# Patient Record
Sex: Male | Born: 1940 | Race: White | Hispanic: No | Marital: Married | Smoking: Never smoker
Health system: Southern US, Community
[De-identification: ages and names within clinical notes are randomized; demographics above are authoritative.]

## PROBLEM LIST (undated history)

## (undated) DIAGNOSIS — I252 Old myocardial infarction: Secondary | ICD-10-CM

## (undated) DIAGNOSIS — I10 Essential (primary) hypertension: Secondary | ICD-10-CM

## (undated) DIAGNOSIS — I4589 Other specified conduction disorders: Secondary | ICD-10-CM

## (undated) DIAGNOSIS — I2510 Atherosclerotic heart disease of native coronary artery without angina pectoris: Secondary | ICD-10-CM

## (undated) DIAGNOSIS — Z950 Presence of cardiac pacemaker: Secondary | ICD-10-CM

## (undated) DIAGNOSIS — K469 Unspecified abdominal hernia without obstruction or gangrene: Secondary | ICD-10-CM

## (undated) DIAGNOSIS — N200 Calculus of kidney: Secondary | ICD-10-CM

## (undated) DIAGNOSIS — C61 Malignant neoplasm of prostate: Secondary | ICD-10-CM

## (undated) DIAGNOSIS — E785 Hyperlipidemia, unspecified: Secondary | ICD-10-CM

## (undated) DIAGNOSIS — M1990 Unspecified osteoarthritis, unspecified site: Secondary | ICD-10-CM

## (undated) DIAGNOSIS — G43909 Migraine, unspecified, not intractable, without status migrainosus: Secondary | ICD-10-CM

## (undated) DIAGNOSIS — Z8673 Personal history of transient ischemic attack (TIA), and cerebral infarction without residual deficits: Secondary | ICD-10-CM

## (undated) DIAGNOSIS — B019 Varicella without complication: Secondary | ICD-10-CM

## (undated) DIAGNOSIS — I5189 Other ill-defined heart diseases: Secondary | ICD-10-CM

## (undated) HISTORY — DX: Presence of cardiac pacemaker: Z95.0

## (undated) HISTORY — DX: Personal history of transient ischemic attack (TIA), and cerebral infarction without residual deficits: Z86.73

## (undated) HISTORY — DX: Atherosclerotic heart disease of native coronary artery without angina pectoris: I25.10

## (undated) HISTORY — DX: Other ill-defined heart diseases: I51.89

## (undated) HISTORY — DX: Unspecified abdominal hernia without obstruction or gangrene: K46.9

## (undated) HISTORY — DX: Hyperlipidemia, unspecified: E78.5

## (undated) HISTORY — DX: Calculus of kidney: N20.0

## (undated) HISTORY — DX: Malignant neoplasm of prostate: C61

## (undated) HISTORY — DX: Other specified conduction disorders: I45.89

## (undated) HISTORY — DX: Varicella without complication: B01.9

## (undated) HISTORY — DX: Essential (primary) hypertension: I10

## (undated) HISTORY — DX: Migraine, unspecified, not intractable, without status migrainosus: G43.909

## (undated) HISTORY — PX: OTHER SURGICAL HISTORY: SHX169

## (undated) HISTORY — DX: Unspecified osteoarthritis, unspecified site: M19.90

## (undated) SURGERY — LEFT HEART CATH
Anesthesia: Moderate Sedation | Laterality: Right

---

## 2004-09-30 ENCOUNTER — Inpatient Hospital Stay: Payer: Self-pay | Admitting: Internal Medicine

## 2004-09-30 ENCOUNTER — Other Ambulatory Visit: Payer: Self-pay

## 2004-10-11 ENCOUNTER — Inpatient Hospital Stay: Payer: Self-pay | Admitting: Internal Medicine

## 2004-12-21 ENCOUNTER — Ambulatory Visit: Payer: Self-pay | Admitting: Specialist

## 2005-01-01 ENCOUNTER — Emergency Department: Payer: Self-pay | Admitting: Emergency Medicine

## 2005-02-08 ENCOUNTER — Encounter: Payer: Self-pay | Admitting: Internal Medicine

## 2005-03-01 ENCOUNTER — Encounter: Payer: Self-pay | Admitting: Internal Medicine

## 2005-04-01 ENCOUNTER — Encounter: Payer: Self-pay | Admitting: Internal Medicine

## 2005-04-07 ENCOUNTER — Ambulatory Visit: Payer: Self-pay | Admitting: Internal Medicine

## 2005-05-02 ENCOUNTER — Encounter: Payer: Self-pay | Admitting: Internal Medicine

## 2005-05-28 ENCOUNTER — Other Ambulatory Visit: Payer: Self-pay

## 2005-05-28 ENCOUNTER — Inpatient Hospital Stay: Payer: Self-pay | Admitting: Infectious Diseases

## 2005-05-29 ENCOUNTER — Other Ambulatory Visit: Payer: Self-pay

## 2005-06-01 ENCOUNTER — Encounter: Payer: Self-pay | Admitting: Internal Medicine

## 2005-07-13 ENCOUNTER — Other Ambulatory Visit: Payer: Self-pay

## 2005-07-14 ENCOUNTER — Inpatient Hospital Stay: Payer: Self-pay | Admitting: Infectious Diseases

## 2006-04-17 ENCOUNTER — Other Ambulatory Visit: Payer: Self-pay

## 2006-04-27 ENCOUNTER — Inpatient Hospital Stay: Payer: Self-pay | Admitting: Specialist

## 2006-09-12 ENCOUNTER — Ambulatory Visit: Payer: Self-pay | Admitting: Urology

## 2006-11-24 ENCOUNTER — Inpatient Hospital Stay: Payer: Self-pay | Admitting: Cardiology

## 2006-11-24 ENCOUNTER — Other Ambulatory Visit: Payer: Self-pay

## 2007-06-19 ENCOUNTER — Emergency Department: Payer: Self-pay | Admitting: Emergency Medicine

## 2007-06-19 ENCOUNTER — Other Ambulatory Visit: Payer: Self-pay

## 2007-09-26 ENCOUNTER — Inpatient Hospital Stay: Payer: Self-pay | Admitting: Internal Medicine

## 2007-09-26 ENCOUNTER — Other Ambulatory Visit: Payer: Self-pay

## 2007-10-28 ENCOUNTER — Emergency Department: Payer: Self-pay | Admitting: Emergency Medicine

## 2008-02-20 ENCOUNTER — Other Ambulatory Visit: Payer: Self-pay

## 2008-02-20 ENCOUNTER — Inpatient Hospital Stay: Payer: Self-pay | Admitting: *Deleted

## 2008-02-29 ENCOUNTER — Other Ambulatory Visit: Payer: Self-pay

## 2008-02-29 ENCOUNTER — Ambulatory Visit: Payer: Self-pay | Admitting: Urology

## 2008-02-29 ENCOUNTER — Inpatient Hospital Stay: Payer: Self-pay | Admitting: Internal Medicine

## 2008-03-05 ENCOUNTER — Inpatient Hospital Stay: Payer: Self-pay | Admitting: Internal Medicine

## 2008-03-05 ENCOUNTER — Other Ambulatory Visit: Payer: Self-pay

## 2008-03-13 ENCOUNTER — Ambulatory Visit: Payer: Self-pay | Admitting: Urology

## 2008-03-20 ENCOUNTER — Ambulatory Visit: Payer: Self-pay | Admitting: Urology

## 2008-04-21 ENCOUNTER — Ambulatory Visit: Payer: Self-pay | Admitting: Urology

## 2008-10-20 ENCOUNTER — Ambulatory Visit: Payer: Self-pay | Admitting: Urology

## 2009-03-13 ENCOUNTER — Inpatient Hospital Stay: Payer: Self-pay | Admitting: Student

## 2009-05-07 ENCOUNTER — Encounter: Payer: Self-pay | Admitting: Cardiology

## 2009-06-01 ENCOUNTER — Encounter: Payer: Self-pay | Admitting: Cardiology

## 2009-06-27 ENCOUNTER — Emergency Department: Payer: Self-pay | Admitting: Emergency Medicine

## 2009-07-01 ENCOUNTER — Encounter: Payer: Self-pay | Admitting: Cardiology

## 2009-07-15 ENCOUNTER — Emergency Department: Payer: Self-pay | Admitting: Internal Medicine

## 2009-08-01 ENCOUNTER — Encounter: Payer: Self-pay | Admitting: Cardiology

## 2009-08-08 ENCOUNTER — Inpatient Hospital Stay: Payer: Self-pay | Admitting: Internal Medicine

## 2009-08-18 ENCOUNTER — Ambulatory Visit: Payer: Self-pay | Admitting: Surgery

## 2009-09-01 ENCOUNTER — Encounter: Payer: Self-pay | Admitting: Cardiology

## 2009-10-26 ENCOUNTER — Ambulatory Visit: Payer: Self-pay | Admitting: Urology

## 2010-03-01 ENCOUNTER — Ambulatory Visit: Payer: Self-pay | Admitting: Internal Medicine

## 2010-03-03 ENCOUNTER — Ambulatory Visit: Payer: Self-pay | Admitting: Urology

## 2010-03-17 ENCOUNTER — Ambulatory Visit: Payer: Self-pay | Admitting: Urology

## 2010-03-18 LAB — PATHOLOGY REPORT

## 2010-04-01 ENCOUNTER — Ambulatory Visit: Payer: Self-pay | Admitting: Internal Medicine

## 2010-04-29 ENCOUNTER — Ambulatory Visit: Payer: Self-pay | Admitting: Radiation Oncology

## 2010-05-01 ENCOUNTER — Ambulatory Visit: Payer: Self-pay | Admitting: Radiation Oncology

## 2010-05-01 ENCOUNTER — Ambulatory Visit: Payer: Self-pay | Admitting: Internal Medicine

## 2010-05-03 ENCOUNTER — Ambulatory Visit: Payer: Self-pay | Admitting: Radiation Oncology

## 2010-05-06 ENCOUNTER — Ambulatory Visit: Payer: Self-pay | Admitting: Radiation Oncology

## 2010-05-26 ENCOUNTER — Ambulatory Visit: Payer: Self-pay | Admitting: Radiation Oncology

## 2010-06-01 ENCOUNTER — Ambulatory Visit: Payer: Self-pay | Admitting: Radiation Oncology

## 2010-06-01 ENCOUNTER — Ambulatory Visit: Payer: Self-pay | Admitting: Internal Medicine

## 2010-08-06 ENCOUNTER — Ambulatory Visit: Payer: Self-pay | Admitting: Radiation Oncology

## 2010-09-01 ENCOUNTER — Ambulatory Visit: Payer: Self-pay | Admitting: Radiation Oncology

## 2010-09-17 ENCOUNTER — Other Ambulatory Visit: Payer: Self-pay

## 2010-11-22 ENCOUNTER — Encounter: Payer: Self-pay | Admitting: Cardiology

## 2010-11-30 ENCOUNTER — Encounter: Payer: Self-pay | Admitting: Cardiology

## 2010-12-09 ENCOUNTER — Ambulatory Visit: Payer: Self-pay | Admitting: Radiation Oncology

## 2010-12-10 LAB — PSA

## 2010-12-31 ENCOUNTER — Ambulatory Visit: Payer: Self-pay | Admitting: Radiation Oncology

## 2010-12-31 ENCOUNTER — Encounter: Payer: Self-pay | Admitting: Cardiology

## 2011-05-25 ENCOUNTER — Ambulatory Visit: Payer: Self-pay | Admitting: Urology

## 2011-06-13 ENCOUNTER — Ambulatory Visit: Payer: Self-pay | Admitting: Radiation Oncology

## 2011-06-14 LAB — PSA: PSA: 2.4 ng/mL (ref 0.0–4.0)

## 2011-06-30 ENCOUNTER — Ambulatory Visit: Payer: Self-pay | Admitting: Ophthalmology

## 2011-06-30 DIAGNOSIS — I119 Hypertensive heart disease without heart failure: Secondary | ICD-10-CM

## 2011-07-02 ENCOUNTER — Ambulatory Visit: Payer: Self-pay | Admitting: Radiation Oncology

## 2011-07-04 ENCOUNTER — Ambulatory Visit: Payer: Self-pay | Admitting: Ophthalmology

## 2011-07-21 DIAGNOSIS — Z950 Presence of cardiac pacemaker: Secondary | ICD-10-CM

## 2011-07-21 HISTORY — DX: Presence of cardiac pacemaker: Z95.0

## 2011-07-22 DIAGNOSIS — I4589 Other specified conduction disorders: Secondary | ICD-10-CM

## 2011-07-22 DIAGNOSIS — I5189 Other ill-defined heart diseases: Secondary | ICD-10-CM | POA: Insufficient documentation

## 2011-07-22 DIAGNOSIS — I10 Essential (primary) hypertension: Secondary | ICD-10-CM

## 2011-07-22 DIAGNOSIS — E785 Hyperlipidemia, unspecified: Secondary | ICD-10-CM

## 2011-07-22 DIAGNOSIS — I2510 Atherosclerotic heart disease of native coronary artery without angina pectoris: Secondary | ICD-10-CM

## 2011-07-22 HISTORY — DX: Essential (primary) hypertension: I10

## 2011-07-22 HISTORY — DX: Other specified conduction disorders: I45.89

## 2011-07-22 HISTORY — DX: Hyperlipidemia, unspecified: E78.5

## 2011-07-22 HISTORY — DX: Other ill-defined heart diseases: I51.89

## 2011-07-22 HISTORY — DX: Atherosclerotic heart disease of native coronary artery without angina pectoris: I25.10

## 2011-09-26 DIAGNOSIS — Z8673 Personal history of transient ischemic attack (TIA), and cerebral infarction without residual deficits: Secondary | ICD-10-CM

## 2011-09-26 DIAGNOSIS — M1990 Unspecified osteoarthritis, unspecified site: Secondary | ICD-10-CM

## 2011-09-26 DIAGNOSIS — J45909 Unspecified asthma, uncomplicated: Secondary | ICD-10-CM | POA: Insufficient documentation

## 2011-09-26 DIAGNOSIS — R002 Palpitations: Secondary | ICD-10-CM | POA: Insufficient documentation

## 2011-09-26 DIAGNOSIS — N200 Calculus of kidney: Secondary | ICD-10-CM

## 2011-09-26 HISTORY — DX: Calculus of kidney: N20.0

## 2011-09-26 HISTORY — DX: Personal history of transient ischemic attack (TIA), and cerebral infarction without residual deficits: Z86.73

## 2011-09-26 HISTORY — DX: Unspecified osteoarthritis, unspecified site: M19.90

## 2011-12-30 ENCOUNTER — Encounter: Payer: Self-pay | Admitting: Cardiology

## 2011-12-31 ENCOUNTER — Encounter: Payer: Self-pay | Admitting: Cardiology

## 2012-01-30 ENCOUNTER — Encounter: Payer: Self-pay | Admitting: Cardiology

## 2012-02-21 ENCOUNTER — Ambulatory Visit: Payer: Self-pay | Admitting: Ophthalmology

## 2012-03-01 ENCOUNTER — Encounter: Payer: Self-pay | Admitting: Cardiology

## 2012-06-25 ENCOUNTER — Ambulatory Visit: Payer: Self-pay | Admitting: Radiation Oncology

## 2012-06-26 LAB — PSA: PSA: 1.4 ng/mL (ref 0.0–4.0)

## 2012-07-01 ENCOUNTER — Ambulatory Visit: Payer: Self-pay | Admitting: Radiation Oncology

## 2012-09-21 LAB — BASIC METABOLIC PANEL
Anion Gap: 7 (ref 7–16)
Calcium, Total: 8.4 mg/dL — ABNORMAL LOW (ref 8.5–10.1)
Chloride: 109 mmol/L — ABNORMAL HIGH (ref 98–107)
EGFR (African American): >60
Glucose: 119 mg/dL — ABNORMAL HIGH (ref 65–99)
Osmolality: 287 (ref 275–301)
Sodium: 143 mmol/L (ref 136–145)

## 2012-09-21 LAB — CBC
HGB: 15.5 g/dL (ref 13.0–18.0)
MCH: 30.7 pg (ref 26.0–34.0)
MCV: 91 fL (ref 80–100)
RBC: 5.05 10*6/uL (ref 4.40–5.90)

## 2012-09-21 LAB — TROPONIN I
Troponin-I: <0.02 ng/mL
Troponin-I: <0.02 ng/mL

## 2012-09-21 LAB — CK TOTAL AND CKMB (NOT AT ARMC): CK-MB: 1.5 ng/mL (ref 0.5–3.6)

## 2012-09-22 ENCOUNTER — Observation Stay: Payer: Self-pay | Admitting: Internal Medicine

## 2012-09-22 LAB — HEMOGLOBIN A1C: Hemoglobin A1C: 5.9 % (ref 4.2–6.3)

## 2012-09-22 LAB — LIPID PANEL
Triglycerides: 187 mg/dL (ref 0–200)
VLDL Cholesterol, Calc: 37 mg/dL (ref 5–40)

## 2012-10-15 ENCOUNTER — Ambulatory Visit: Payer: Self-pay | Admitting: Family Medicine

## 2012-10-19 ENCOUNTER — Ambulatory Visit: Payer: Self-pay | Admitting: Family Medicine

## 2013-03-04 DIAGNOSIS — C61 Malignant neoplasm of prostate: Secondary | ICD-10-CM

## 2013-03-04 HISTORY — DX: Malignant neoplasm of prostate: C61

## 2013-04-18 ENCOUNTER — Emergency Department: Payer: Self-pay | Admitting: Emergency Medicine

## 2013-04-18 LAB — URINALYSIS, COMPLETE
Bilirubin,UR: NEGATIVE
Glucose,UR: NEGATIVE mg/dL (ref 0–75)
Nitrite: NEGATIVE
Ph: 5 (ref 4.5–8.0)
Protein: 30
RBC,UR: 6 /HPF (ref 0–5)
Specific Gravity: 1.025 (ref 1.003–1.030)
Squamous Epithelial: NONE SEEN
WBC UR: <1 /HPF (ref 0–5)

## 2013-04-18 LAB — COMPREHENSIVE METABOLIC PANEL
Bilirubin,Total: 1 mg/dL (ref 0.2–1.0)
Calcium, Total: 8.7 mg/dL (ref 8.5–10.1)
Chloride: 108 mmol/L — ABNORMAL HIGH (ref 98–107)
Co2: 24 mmol/L (ref 21–32)
Creatinine: 1.15 mg/dL (ref 0.60–1.30)
EGFR (African American): >60
EGFR (Non-African Amer.): >60
Glucose: 120 mg/dL — ABNORMAL HIGH (ref 65–99)
Osmolality: 275 (ref 275–301)
SGPT (ALT): 29 U/L (ref 12–78)
Total Protein: 6.8 g/dL (ref 6.4–8.2)

## 2013-04-18 LAB — CBC
MCH: 30.8 pg (ref 26.0–34.0)
Platelet: 182 10*3/uL (ref 150–440)
RDW: 14.2 % (ref 11.5–14.5)
WBC: 14.6 10*3/uL — ABNORMAL HIGH (ref 3.8–10.6)

## 2013-04-19 LAB — TROPONIN I: Troponin-I: <0.02 ng/mL

## 2013-04-19 LAB — CK TOTAL AND CKMB (NOT AT ARMC): CK, Total: 54 U/L (ref 35–232)

## 2013-05-23 ENCOUNTER — Ambulatory Visit: Payer: Self-pay | Admitting: Gastroenterology

## 2013-05-24 ENCOUNTER — Inpatient Hospital Stay: Payer: Self-pay | Admitting: Specialist

## 2013-05-24 LAB — BASIC METABOLIC PANEL
BUN: 12 mg/dL (ref 7–18)
Calcium, Total: 8.7 mg/dL (ref 8.5–10.1)
Chloride: 107 mmol/L (ref 98–107)
Co2: 23 mmol/L (ref 21–32)
Creatinine: 1.13 mg/dL (ref 0.60–1.30)
EGFR (African American): >60
EGFR (Non-African Amer.): >60
Glucose: 120 mg/dL — ABNORMAL HIGH (ref 65–99)
Osmolality: 275 (ref 275–301)
Potassium: 4.2 mmol/L (ref 3.5–5.1)
Sodium: 137 mmol/L (ref 136–145)

## 2013-05-24 LAB — PATHOLOGY REPORT

## 2013-05-24 LAB — CK TOTAL AND CKMB (NOT AT ARMC)
CK, Total: 148 U/L (ref 35–232)
CK, Total: 159 U/L (ref 35–232)
CK-MB: 3.8 ng/mL — ABNORMAL HIGH (ref 0.5–3.6)

## 2013-05-24 LAB — CBC
HGB: 15.3 g/dL (ref 13.0–18.0)
MCH: 31.6 pg (ref 26.0–34.0)
MCHC: 35.1 g/dL (ref 32.0–36.0)
MCV: 90 fL (ref 80–100)
Platelet: 202 10*3/uL (ref 150–440)
RBC: 4.85 10*6/uL (ref 4.40–5.90)
RDW: 14.2 % (ref 11.5–14.5)

## 2013-05-24 LAB — PROTIME-INR
INR: 1
Prothrombin Time: 13.2 secs (ref 11.5–14.7)

## 2013-05-24 LAB — TROPONIN I
Troponin-I: 0.24 ng/mL — ABNORMAL HIGH
Troponin-I: 0.22 ng/mL — ABNORMAL HIGH

## 2013-05-24 LAB — HEMOGLOBIN: HGB: 14.5 g/dL (ref 13.0–18.0)

## 2013-05-25 LAB — BASIC METABOLIC PANEL
Anion Gap: 6 — ABNORMAL LOW (ref 7–16)
BUN: 7 mg/dL (ref 7–18)
Calcium, Total: 8.4 mg/dL — ABNORMAL LOW (ref 8.5–10.1)
Co2: 25 mmol/L (ref 21–32)
Creatinine: 0.94 mg/dL (ref 0.60–1.30)
EGFR (African American): >60
EGFR (Non-African Amer.): >60
Glucose: 115 mg/dL — ABNORMAL HIGH (ref 65–99)
Osmolality: 275 (ref 275–301)
Sodium: 138 mmol/L (ref 136–145)

## 2013-05-25 LAB — CBC WITH DIFFERENTIAL/PLATELET
Basophil %: 0.5 %
Eosinophil #: 0.2 10*3/uL (ref 0.0–0.7)
HCT: 42 % (ref 40.0–52.0)
HGB: 14.4 g/dL (ref 13.0–18.0)
Lymphocyte #: 1.7 10*3/uL (ref 1.0–3.6)
Lymphocyte %: 18.3 %
MCH: 31.1 pg (ref 26.0–34.0)
MCHC: 34.3 g/dL (ref 32.0–36.0)
MCV: 91 fL (ref 80–100)
Monocyte #: 0.9 x10 3/mm (ref 0.2–1.0)
Monocyte %: 9.2 %
Neutrophil #: 6.5 10*3/uL (ref 1.4–6.5)
Neutrophil %: 69.4 %
RBC: 4.64 10*6/uL (ref 4.40–5.90)
WBC: 9.4 10*3/uL (ref 3.8–10.6)

## 2013-05-25 LAB — LIPID PANEL
Cholesterol: 172 mg/dL (ref 0–200)
HDL Cholesterol: 23 mg/dL — ABNORMAL LOW (ref 40–60)
Ldl Cholesterol, Calc: 110 mg/dL — ABNORMAL HIGH (ref 0–100)
Triglycerides: 197 mg/dL (ref 0–200)
VLDL Cholesterol, Calc: 39 mg/dL (ref 5–40)

## 2013-05-25 LAB — CK: CK, Total: 153 U/L (ref 35–232)

## 2013-06-07 ENCOUNTER — Encounter: Payer: Self-pay | Admitting: Cardiovascular Disease

## 2013-06-21 ENCOUNTER — Ambulatory Visit: Payer: Self-pay | Admitting: Radiation Oncology

## 2013-06-24 ENCOUNTER — Other Ambulatory Visit: Payer: Self-pay | Admitting: Gastroenterology

## 2013-06-25 LAB — WBCS, STOOL

## 2013-07-01 ENCOUNTER — Encounter: Payer: Self-pay | Admitting: Cardiovascular Disease

## 2013-07-01 ENCOUNTER — Ambulatory Visit: Payer: Self-pay | Admitting: Radiation Oncology

## 2013-07-11 IMAGING — CT CT ABDOMEN W/ CM
1 of 2 series · 15 of 32 positions shown, 19 images · non-contrast
Comparison: none

REASON FOR EXAM: abd pain  US showed gallstones
COMMENTS:

[Series 2: abd 3mm w 3.0 i40f 3 · axial · 0.97mm/px · z∈[-1008,-723]mm · 15 of 105 slices shown, 19 images]
[im 5/105  soft-tissue]
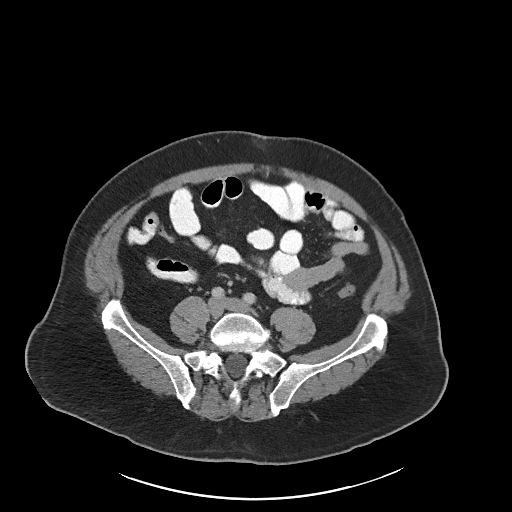
[im 5/105  bone]
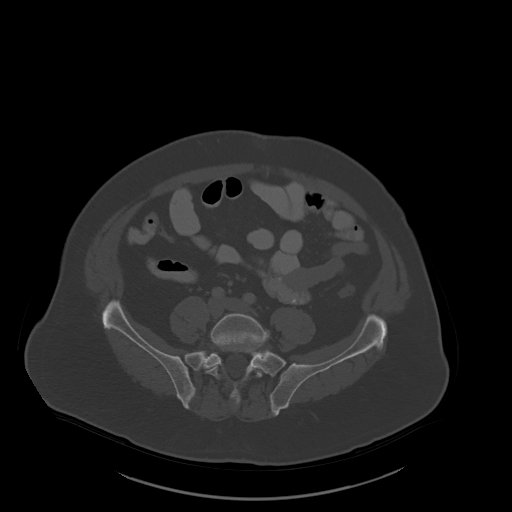
[im 14/105  soft-tissue]
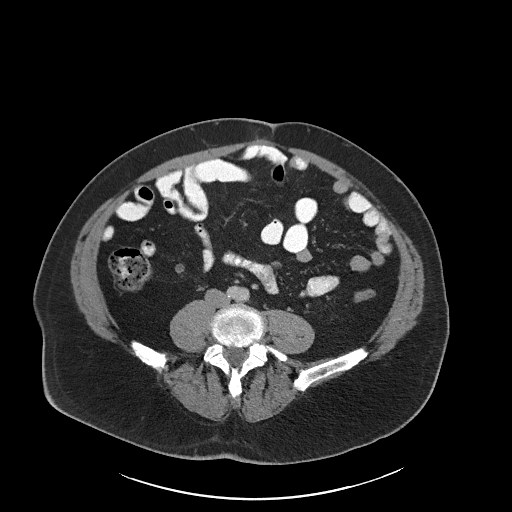
[im 22/105  soft-tissue]
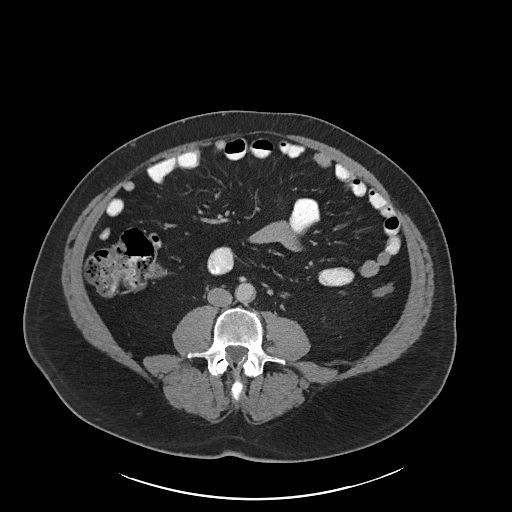
[im 31/105  soft-tissue]
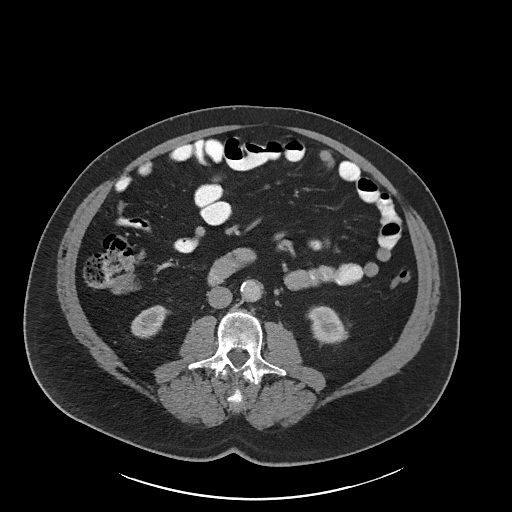
[im 35/105  soft-tissue]
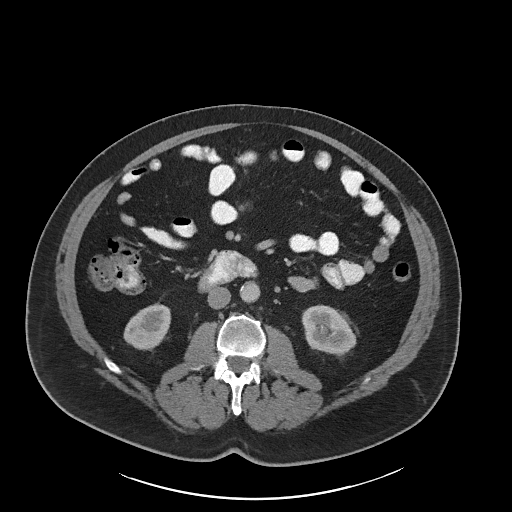
[im 44/105  soft-tissue]
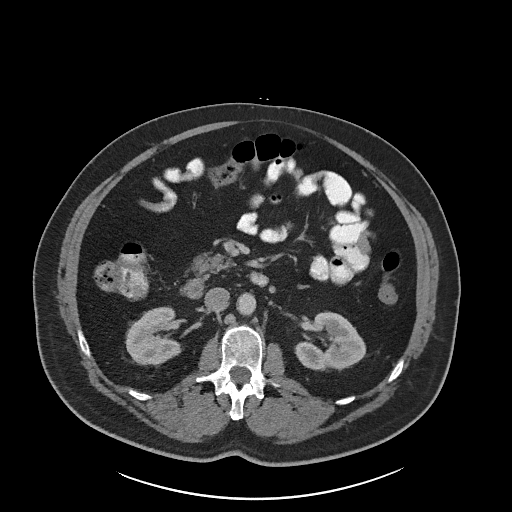
[im 53/105  soft-tissue]
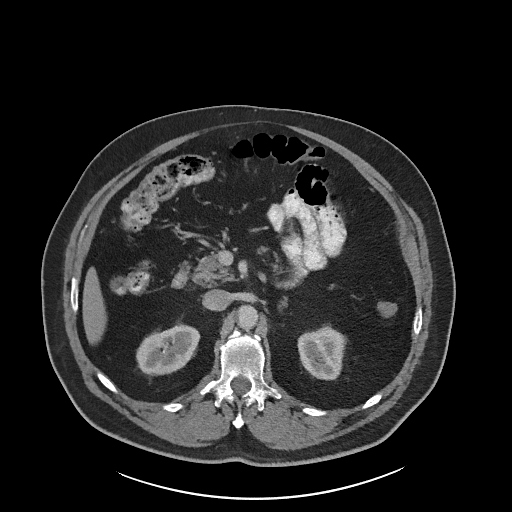
[im 61/105  soft-tissue]
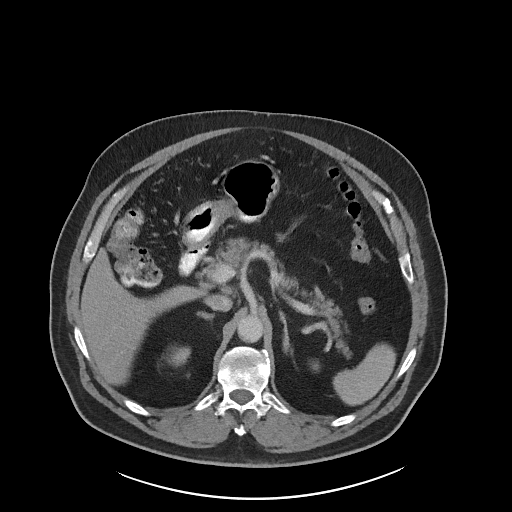
[im 70/105  soft-tissue]
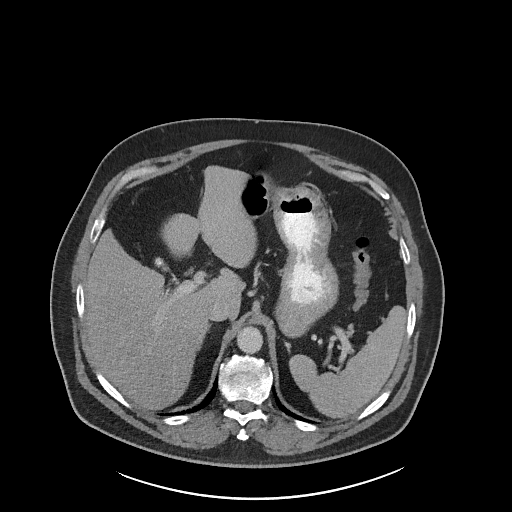
[im 70/105  bone]
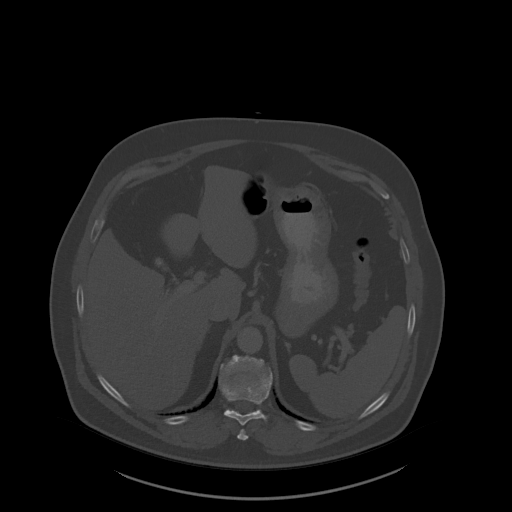
[im 74/105  soft-tissue]
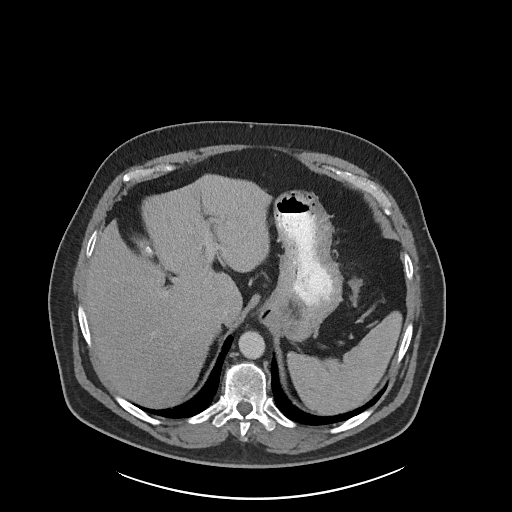
[im 83/105  soft-tissue]
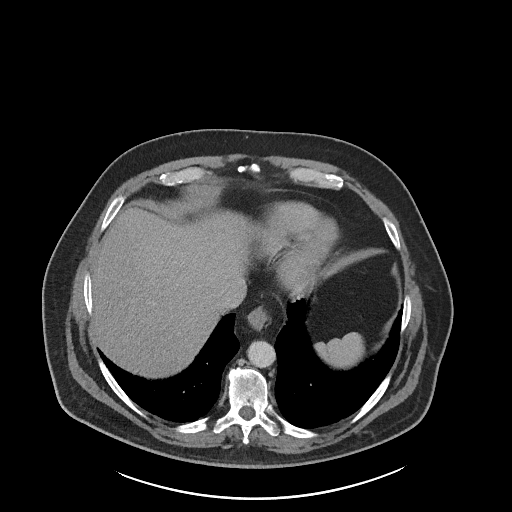
[im 87/105  lung]
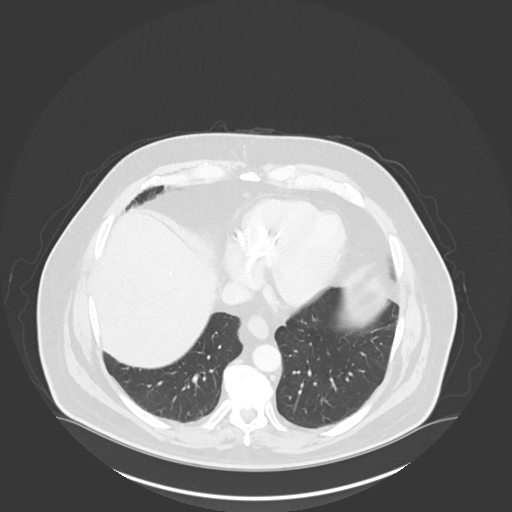
[im 92/105  soft-tissue]
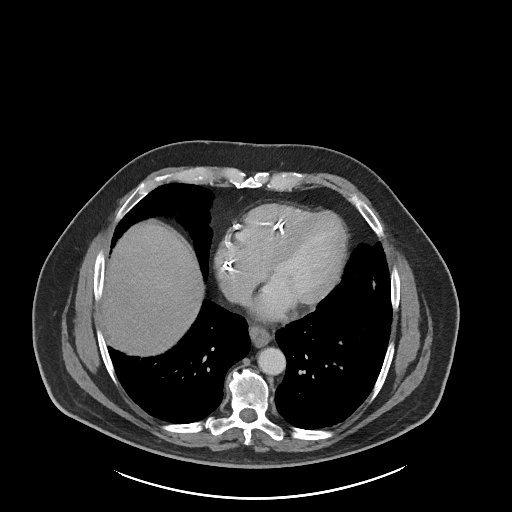
[im 92/105  lung]
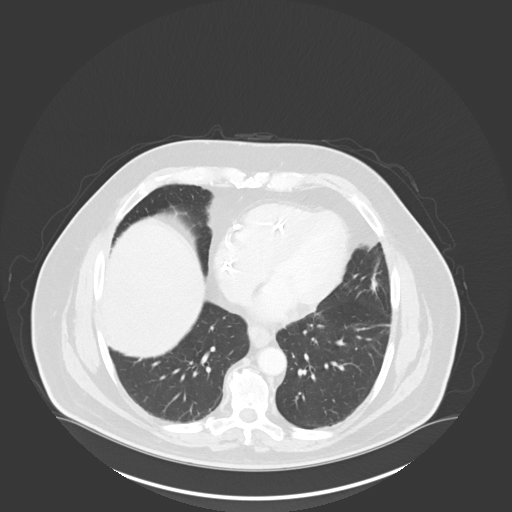
[im 96/105  lung]
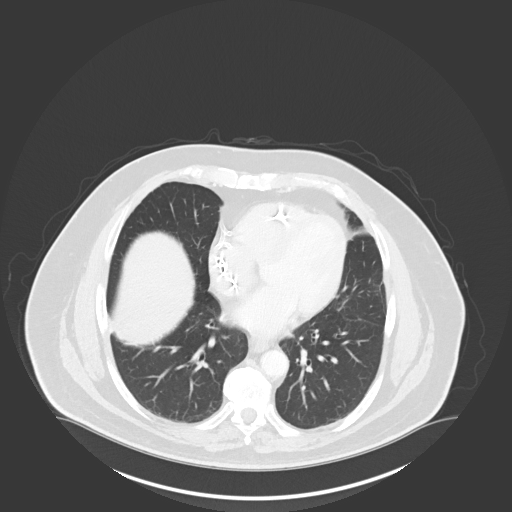
[im 100/105  soft-tissue]
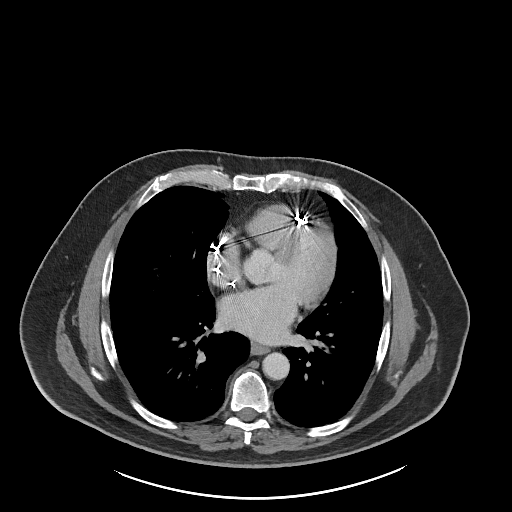
[im 100/105  lung]
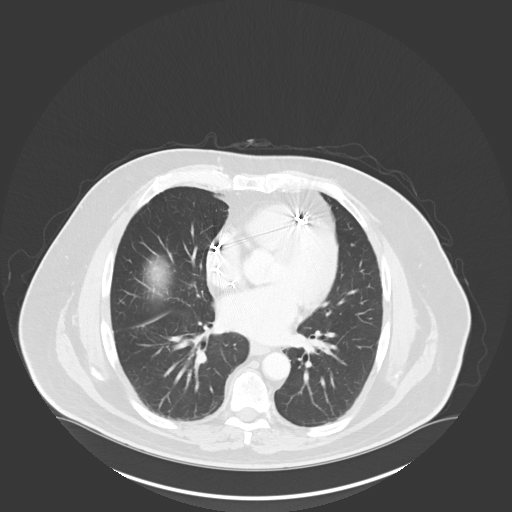

[15 of 32 positions shown; findings below may reference images not displayed]

PROCEDURE:     KCT - KCT ABDOMEN STANDARD W  - October 19, 2012  [DATE]

RESULT:     Axial CT scanning was performed through the abdomen with
reconstructions at 3 mm intervals and slice thicknesses following
intravenous administration of 85 cc of Lsovue-DJJ. Review of multiplanar
reconstructed images was performed separately on the VIA monitor. Comparison
made to study August 08, 2009.

The gallbladder is contracted and contains multiple calcified stones. There
is no pericholecystic fluid. The gallbladder wall may be minimally
thickened. The liver exhibits no focal mass or ductal dilation. There is a
punctate parenchymal calcification near the dome. The spleen, partially
distended stomach, pancreas, adrenal glands, and kidneys exhibit no acute
abnormalities. There is a nonobstructing 2 mm diameter upper pole stone in
the right kidney. There is a small hiatal hernia. The caliber of the
abdominal aorta is normal. The visualized portions of the small and large
bowel appear normal. A normal calibered uninflamed appendix is demonstrated
on images 84 through 100.

The lung bases exhibit minimal stable fibrotic change in the lingula. The
lumbar vertebral bodies are preserved in height.
IMPRESSION: 1. The gallbladder is contracted and contains multiple calcified stones.
There is no pericholecystic fluid or more than minimal gallbladder wall
thickening.
2. There is no evidence of acute abnormality of the liver or spleen or
pancreas.
3. The stomach and visualized portions of the small bowel appear normal.
4. There is a nonobstructing 2 mm diameter upper pole stone in the right
kidney. Otherwise the kidneys appear normal.

[REDACTED]

## 2013-08-01 ENCOUNTER — Encounter: Payer: Self-pay | Admitting: Cardiovascular Disease

## 2013-11-06 ENCOUNTER — Inpatient Hospital Stay: Payer: Self-pay | Admitting: Internal Medicine

## 2013-11-06 LAB — CBC
HCT: 44.2 % (ref 40.0–52.0)
HGB: 14.9 g/dL (ref 13.0–18.0)
MCH: 30.7 pg (ref 26.0–34.0)
MCHC: 33.7 g/dL (ref 32.0–36.0)
MCV: 91 fL (ref 80–100)
Platelet: 206 10*3/uL (ref 150–440)
RBC: 4.86 10*6/uL (ref 4.40–5.90)
RDW: 14.5 % (ref 11.5–14.5)
WBC: 7.1 10*3/uL (ref 3.8–10.6)

## 2013-11-06 LAB — BASIC METABOLIC PANEL
Anion Gap: 6 — ABNORMAL LOW (ref 7–16)
BUN: 16 mg/dL (ref 7–18)
CO2: 24 mmol/L (ref 21–32)
CREATININE: 1.16 mg/dL (ref 0.60–1.30)
Calcium, Total: 8.1 mg/dL — ABNORMAL LOW (ref 8.5–10.1)
Chloride: 110 mmol/L — ABNORMAL HIGH (ref 98–107)
EGFR (African American): >60
GLUCOSE: 112 mg/dL — AB (ref 65–99)
Osmolality: 281 (ref 275–301)
POTASSIUM: 3.9 mmol/L (ref 3.5–5.1)
SODIUM: 140 mmol/L (ref 136–145)

## 2013-11-06 LAB — CK TOTAL AND CKMB (NOT AT ARMC)
CK, TOTAL: 72 U/L
CK, Total: 50 U/L
CK, Total: 38 U/L — ABNORMAL LOW
CK-MB: 1.5 ng/mL (ref 0.5–3.6)
CK-MB: 1.3 ng/mL (ref 0.5–3.6)
CK-MB: 1.3 ng/mL (ref 0.5–3.6)

## 2013-11-06 LAB — TROPONIN I

## 2013-11-07 LAB — CBC WITH DIFFERENTIAL/PLATELET
Basophil #: 0 10*3/uL (ref 0.0–0.1)
Basophil %: 0.4 %
EOS PCT: 1.8 %
Eosinophil #: 0.2 10*3/uL (ref 0.0–0.7)
HCT: 44.2 % (ref 40.0–52.0)
HGB: 14.7 g/dL (ref 13.0–18.0)
LYMPHS ABS: 1.4 10*3/uL (ref 1.0–3.6)
Lymphocyte %: 14.2 %
MCH: 30.3 pg (ref 26.0–34.0)
MCHC: 33.3 g/dL (ref 32.0–36.0)
MCV: 91 fL (ref 80–100)
MONOS PCT: 8.8 %
Monocyte #: 0.9 x10 3/mm (ref 0.2–1.0)
NEUTROS ABS: 7.5 10*3/uL — AB (ref 1.4–6.5)
NEUTROS PCT: 74.8 %
PLATELETS: 185 10*3/uL (ref 150–440)
RBC: 4.86 10*6/uL (ref 4.40–5.90)
RDW: 14.2 % (ref 11.5–14.5)
WBC: 10 10*3/uL (ref 3.8–10.6)

## 2013-11-07 LAB — BASIC METABOLIC PANEL
ANION GAP: 4 — AB (ref 7–16)
BUN: 11 mg/dL (ref 7–18)
CALCIUM: 8.4 mg/dL — AB (ref 8.5–10.1)
CO2: 27 mmol/L (ref 21–32)
Chloride: 107 mmol/L (ref 98–107)
Creatinine: 1.11 mg/dL (ref 0.60–1.30)
Glucose: 93 mg/dL (ref 65–99)
Osmolality: 275 (ref 275–301)
Potassium: 3.9 mmol/L (ref 3.5–5.1)
Sodium: 138 mmol/L (ref 136–145)

## 2013-11-07 LAB — CK TOTAL AND CKMB (NOT AT ARMC)
CK, Total: 34 U/L — ABNORMAL LOW
CK-MB: 1.1 ng/mL (ref 0.5–3.6)

## 2013-11-07 LAB — TROPONIN I

## 2013-11-07 LAB — MAGNESIUM: Magnesium: 1.8 mg/dL

## 2013-12-02 ENCOUNTER — Encounter: Payer: Self-pay | Admitting: Cardiovascular Disease

## 2013-12-30 ENCOUNTER — Encounter: Payer: Self-pay | Admitting: Cardiovascular Disease

## 2014-02-13 IMAGING — CR DG CHEST 1V PORT
1 series · 1 of 1 positions shown · non-contrast
Comparison: none

REASON FOR EXAM: Chest Pain
COMMENTS:

[ap]
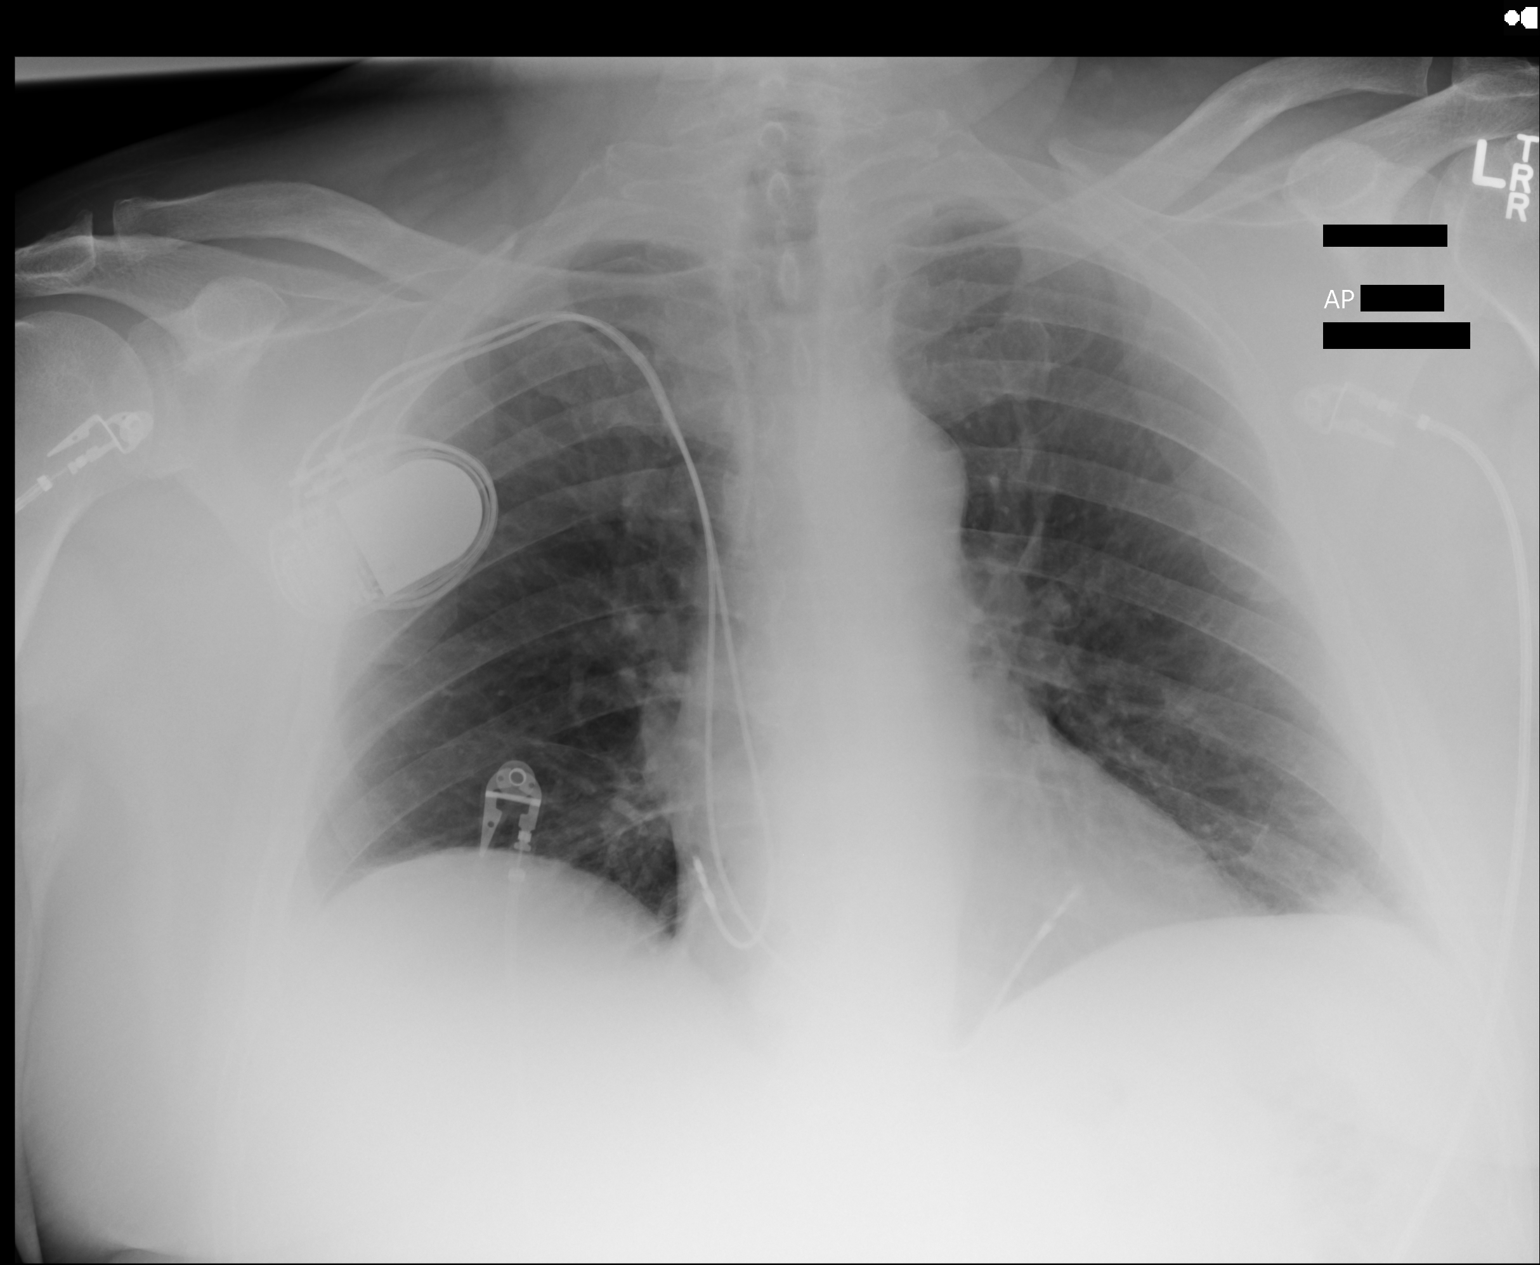

[1 of 1 positions shown; findings below may reference images not displayed]

PROCEDURE:     DXR - DXR PORTABLE CHEST SINGLE VIEW  - May 24, 2013  [DATE]

RESULT:     Comparison is made to the study April 18, 2013.

The lungs are reasonably well inflated and clear. The cardiac silhouette is
normal in size. A permanent pacemaker is in place. The pulmonary vascularity
is not engorged. There is no pleural effusion or alveolar infiltrate. There
is no pneumothorax. The observed portions of the bony thorax appear normal.
IMPRESSION: There is no evidence of acute cardiopulmonary abnormality.

[REDACTED]

## 2014-05-22 DIAGNOSIS — K469 Unspecified abdominal hernia without obstruction or gangrene: Secondary | ICD-10-CM

## 2014-05-22 DIAGNOSIS — Z87442 Personal history of urinary calculi: Secondary | ICD-10-CM | POA: Insufficient documentation

## 2014-05-22 DIAGNOSIS — B019 Varicella without complication: Secondary | ICD-10-CM

## 2014-05-22 DIAGNOSIS — G43909 Migraine, unspecified, not intractable, without status migrainosus: Secondary | ICD-10-CM | POA: Insufficient documentation

## 2014-05-22 HISTORY — DX: Migraine, unspecified, not intractable, without status migrainosus: G43.909

## 2014-05-22 HISTORY — DX: Varicella without complication: B01.9

## 2014-05-22 HISTORY — DX: Unspecified abdominal hernia without obstruction or gangrene: K46.9

## 2014-06-13 ENCOUNTER — Emergency Department: Payer: Self-pay | Admitting: Emergency Medicine

## 2014-06-20 ENCOUNTER — Ambulatory Visit: Payer: Self-pay | Admitting: Radiation Oncology

## 2014-07-01 ENCOUNTER — Ambulatory Visit: Payer: Self-pay | Admitting: Radiation Oncology
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
## 2014-11-21 NOTE — H&P (Signed)
PATIENT NAME:  Clayton Sawyer, Clayton Sawyer MR#:  [id number] DATE OF BIRTH:  08-21-1940  DATE OF ADMISSION:  05/24/2013  REFERRING PHYSICIAN: Dr. Charlesetta Ivory.   PRIMARY CARE PHYSICIAN: Dr. Jarome Lamas.   PRIMARY CARDIOLOGIST: Following at [facility] cardiology with Dr. Lacie Scotts.    GASTROENTEROLOGY:  Dr. Candace Cruise.   CHIEF COMPLAINT: Chest pain.  HISTORY OF PRESENT ILLNESS: This is a 74 year old male with significant past medical history of coronary artery disease, with multiple stents in the past, most recent cath in March 2013 at [facility] with drug-eluting stent placement. The patient reports that every time he is taken off Plavix, he has an episode of a MI, history of hypertension, hyperlipidemia but intolerant to statins, history of TIA in the past. The patient had endoscopy and colonoscopy done yesterday for evaluation of chronic diarrhea with multiple biopsies done and no acute findings in the colon, as well, 1 biopsy taken from the esophagus as well. The patient reports he has been off Plavix and aspirin for the last 5 days since 10/18. The patient reports he had chest pain overnight, described it as midsternal tightness radiating to the back resolved with nitroglycerin x 3. Every time he  takes nitroglycerin it resolves but it comes back in an hour.  In the ED, the patient had nitro paste which helped his chest pain. The patient denies any nausea, vomiting, sweating, shortness of breath or palpitations, weakness, dizziness. As well, he denies any coffee-ground emesis, any bright red blood per rectum or bleed.  The patient reports his chest pain resembles the pain he had with his last MI. In the ED, the patient had basic work-up done which came back significant for elevated troponin at 0.24, the patient's hemoglobin was stable at 15.3, which is his baseline. The patient was given 324 mg of aspirin and was started on heparin drip. I discussed the case with Dr. Neoma Laming, cardiology on call, who as well recommended to  load the patient with 300 mg of p.o. Plavix. The hospitalist service was requested to admit the patient for further management and treatment of his non-ST elevated myocardial infarction. The patient's EKG did not show any significant ST or T wave changes.   PAST MEDICAL HISTORY: 1.  History of coronary artery disease with last cardiac cath in March 2013 with drug-eluting stent placement.  2.  History of TIA.  3.  Bradycardia requiring pacemaker.  4.  Nephrolithiasis.   5.  Hyperlipidemia. The patient is intolerant to statin.  6.  Hypertension.   PAST SURGICAL HISTORY:  1.  Cardiac stent implant.  2.  Pacemaker insertion for bradycardia.  3.  Right total knee replacement.  4.  Cataract surgery.   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 He is a nonsmoker. No history of alcohol or drug use.   FAMILY HISTORY: Significant for CVA in his father  . Mother died from complication of pneumonia.   ALLERGIES: LIPITOR, STATIN, TRICOR.   HOME MEDICATIONS: 1.  The patient was on aspirin 81 mg oral daily, Plavix 75 mg oral daily, which were held on October 18, so he has not been taking them for the last 5 days.  2.  Sublingual nitroglycerin as needed.  3.  Metoprolol extended release succinate 12.5 mg p.o. daily.  4.  Protonix 20 mg oral daily.   REVIEW OF SYSTEMS: CONSTITUTIONAL: The patient denies fever, chills, fatigue, weakness, weight gain, weight loss.  EYES: Denies blurry vision, double vision,  inflammation, glaucoma.  EARS, NOSE, THROAT: Denies tinnitus, ear pain discharge.  RESPIRATORY: Denies cough, wheezing, hemoptysis, dyspnea, COPD.  CARDIOVASCULAR: Reports chest pain, currently resolved. Denies edema, arrhythmia, palpitations, syncope.  GASTROINTESTINAL: Denies nausea, vomiting, diarrhea, abdominal pain, hematemesis, or melena.  GENITOURINARY: Denies dysuria, hematuria, or renal colic.  ENDOCRINE: Denies polyuria, polydipsia, heat or  cold intolerance.  HEMATOLOGY: Denies anemia, easy bruising, bleeding diathesis.  INTEGUMENTARY: Denies acne, rash or skin lesions.  MUSCULOSKELETAL: Denies any gout, swelling, cramps. NEUROLOGIC: Denies CVA, dementia, headache, vertigo, tremors, migraine. Has history of TIA. Denies any focal deficits or numbness.  PSYCHIATRIC:  Denies anxiety, insomnia, schizophrenia, substance or alcohol abuse.   PHYSICAL EXAMINATION: VITAL SIGNS: Temperature 98.4, pulse 62, respiratory rate 18, blood pressure 138/65, saturating 98% on oxygen.  GENERAL: Obese man, looks comfortable in bed in no apparent distress.  HEENT: Head atraumatic, normocephalic. Pupils equal, reactive to light. Pink conjunctivae. Anicteric sclerae. Moist oral mucosa.  NECK: Supple. No thyromegaly. No JVD.  CHEST: Good air entry bilaterally. No wheezing, rales, rhonchi.  CARDIOVASCULAR: S1, S2 heard. No rubs, murmur or gallops.  ABDOMEN: Soft, nontender, nondistended. Bowel sounds present.  EXTREMITIES: No edema. No clubbing. No cyanosis. Dorsalis pedis pulse +2 bilaterally.  PSYCHIATRIC: Appropriate affect. Awake, alert x 3. Intact judgment and insight.  NEUROLOGIC: Cranial nerves grossly intact. Motor 5 out of 5. No focal deficits.  SKIN: Normal skin turgor. Warm and dry. No rash.  LYMPHATICS: No cervical or supraclavicular lymphadenopathy.   PERTINENT LABORATORY DATA: Glucose 120, BUN 12, creatinine 1.13, sodium 137, potassium 4.2, chloride 107, CO2 23, troponin 0.24, CK-MB 4.7, white blood cells 8.9, hemoglobin 15.3, hematocrit 43.6, platelets 202.   EKG showing normal sinus rhythm without significant ST or T wave changes, at 62 beats per minute.   ASSESSMENT AND PLAN: 1.  Non-ST-elevated myocardial infarction.  The patient is found to have history of coronary artery disease.  He presents with chest pain, has elevated troponin, so this is due to non-ST-elevated myocardial infarction. The patient already was given 324 of aspirin  in the ED. Chest pain currently resolved with nitroglycerin. The patient will be started on heparin drip. Discussed with Dr. Humphrey Rolls, cardiology on-call. The patient will be loaded on Plavix, he is already on beta blockers and he cannot tolerate any statins. We will admit him to telemetry and continue to cycle his cardiac enzymes.  2.  Recent colonoscopy with EGD with biopsy, the patient is on blood thinner, will be monitored closely for any signs of bleed, we will check his hemoglobin and hematocrit every 8 hours.  3.  History of coronary artery disease. The patient is on aspirin and Plavix and beta blockers.  4.  Hypertension: Blood pressure acceptable. Continue with the metoprolol.  5.  History of transient ischemic attack.  The patient resumed aspirin.  6.  History of bradycardia/sick sinus syndrome. He has a pacemaker, currently heart rate is acceptable.  7.  History of hyperlipidemia. Unfortunately, the patient cannot tolerate any statin or TriCor. 8.  Deep venous thrombosis prophylaxis: The patient is on full dose anticoagulation.  9.  Gastrointestinal Prophylaxis: The patient is on proton pump inhibitor.  10.   CODE STATUS: FULL CODE.   TOTAL TIME SPENT ON ADMISSION AND PATIENT CARE: 55 minutes.    ____________________________ Albertine Patricia, MD dse:dp D: 05/24/2013 07:36:49 ET T: 05/24/2013 08:00:11 ET JOB#: [id number]  cc: Albertine Patricia, MD, <Dictator> Atlas Kuc Graciela Husbands MD ELECTRONICALLY SIGNED 05/25/2013 6:37

## 2014-11-21 NOTE — Discharge Summary (Signed)
PATIENT NAME:  Clayton Sawyer, Clayton Sawyer MR#:  [id number] DATE OF BIRTH:  1940-09-01  DATE OF ADMISSION:  05/24/2013 DATE OF DISCHARGE:  05/25/2013  For a detailed note, please take a look at the history and physical done on admission by Dr. Waldron Labs.  DIAGNOSES AT DISCHARGE:  1.  Non-ST-elevation myocardial infarction, status post cardiac catheterization with drug-eluting stent to the circumflex artery.  2.  Hypertension.  3.  Gastroesophageal reflux disease.   DIET: The patient is being discharged on a low-sodium, low-fat diet.   ACTIVITY: As tolerated.   FOLLOWUP: With Dr. Jarome Lamas and Dr. Neoma Laming in the next 1 to 2 weeks.  DISCHARGE MEDICATIONS: Toprol 12.5 mg at bedtime, sublingual nitroglycerin as needed, Protonix 20 mg daily, aspirin 81 mg daily and Plavix 75 mg daily.   [place] COURSE: Dr. Neoma Laming from cardiology.   PERTINENT STUDIES DONE DURING THE HOSPITAL COURSE: A chest x-ray done on admission showing no evidence of an acute cardiopulmonary abnormality. Cardiac catheterization showing a global ejection fraction of 60%, a drug-eluting stent performed to the 90% lesion on the proximal circumflex.   HOSPITAL COURSE: This is a 74 year old male who presented to the hospital with chest pain and noted to have mildly elevated troponins with a suspected non-ST-elevation MI.  1.  Non-ST-elevation MI. This was likely the cause of the patient's chest pain and elevated troponins. The patient was started on aspirin, maintained on his Plavix, started on a heparin nomogram. The patient underwent a cardiac catheterization on the afternoon of 05/24/2013, which showed a 90% lesion of the circumflex artery, which was stented. Post stent placement, the patient's chest pain has improved. He is clinically hemodynamically stable and doing well and therefore being discharged back on his aspirin, Plavix and Toprol as mentioned. THE PATIENT APPARENTLY HAS ALLERGIES TO STATINS and  therefore was not discharged on statins presently.  2.  Hypertension. The patient remained hemodynamically stable. He will continue his Toprol as mentioned. 3.  GERD. The patient was maintained on his Protonix and he will resume that upon discharge.   CODE STATUS: The patient is a full code.   TIME SPENT ON DISCHARGE: 40 minutes.   ____________________________ Belia Heman. Verdell Carmine, MD vjs:jm D: 05/25/2013 14:44:24 ET T: 05/25/2013 15:52:17 ET JOB#: [id number]  cc: Belia Heman. Verdell Carmine, MD, <Dictator> Irven Easterly. Kary Kos, MD Dionisio David, MD Henreitta Leber MD ELECTRONICALLY SIGNED 05/29/2013 14:33
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
## 2014-11-21 NOTE — Consult Note (Signed)
PATIENT NAME:  Clayton Sawyer, Clayton Sawyer MR#:  [id number] DATE OF BIRTH:  1941-06-15  DATE OF CONSULTATION:  05/24/2013  REFERRING PHYSICIAN:   CONSULTING PHYSICIAN:  Dionisio David, MD  INDICATION FOR CONSULTATION: Non- STEMI, urgent consult.  HISTORY OF PRESENT ILLNESS: This is a 74 year old white male with a past medical history of coronary artery disease status post PCI and stenting 5 times at [facility]. He normally sees Dr. Jerelene Redden at [facility], was seen recently by Dr. Jerelene Redden. He needed colonoscopy and EGD and Dr. Candace Cruise, on 10/23, did his EGD and colonoscopy. He went off Plavix and aspirin on 05/18/2013. Apparently Dr. Candace Cruise talked to Dr. Jerelene Redden at [facility] and they agreed to stopping the Plavix, but I am not sure if the aspirin was supposed to be stopped. He comes in now with chest pain since 10:00 p.m. last night. He took 4 nitroglycerins without much relief of the chest. At 4:30 a.m. he decided to come to the Emergency Room. Still having some shoulder pain.   PAST MEDICAL HISTORY: He has history of hypercholesterolemia. He has history of low HDL and history of hypertension. No history of diabetes. He also has history of having last cardiac cath in March 2013 and had another stent placed. It is not clear what vessel the stenting was done. He also has a history of TIA. He had also bradycardia and had a pacemaker implanted.   SOCIAL HISTORY: Denies EtOH abuse or smoking.   FAMILY HISTORY: Positive for coronary artery disease.   PHYSICAL EXAMINATION: GENERAL: He is alert and oriented x 3, in no acute distress. VITALS: Stable. NECK: No JVD.  LUNGS: Clear.  HEART: Regular rate and rhythm. Normal S1, S2. No audible murmur.  ABDOMEN: Soft, nontender. Positive bowel sounds.  EXTREMITIES: No pedal edema.  NEUROLOGIC: Appears to be intact.   LABORATORY AND DIAGNOSTICS: His EKG shows normal sinus rhythm, 62 beats per minute, LVH, nonspecific ST-T changes.   His troponin is 0.24. MB fraction is 4.7. CPK is  214. BUN and creatinine are normal.   ASSESSMENT AND PLAN: The patient is having non-ST-segment elevation myocardial infarction with multiple stents in the past. Probably related to stopping aspirin and Plavix since the 1 8th. We will proceed with cardiac catheterization. We will restart the patient on 325 of aspirin, load with 300 mg of Plavix, continue heparin and set up for cardiac catheterization.  ____________________________ Dionisio David, MD sak:sb D: 05/24/2013 08:21:32 ET T: 05/24/2013 08:38:12 ET JOB#: [id number]  cc: Dionisio David, MD, <Dictator> Dionisio David MD ELECTRONICALLY SIGNED 05/27/2013 15:19

## 2014-11-21 NOTE — Discharge Summary (Signed)
PATIENT NAME:  Clayton Sawyer, Clayton Sawyer MR#:  [id number] DATE OF BIRTH:  July 20, 1941  DATE OF ADMISSION:  09/22/2012 DATE OF DISCHARGE:  09/22/2012  ADMITTING DIAGNOSIS: Chest pain.   DISCHARGE DIAGNOSES: 1.  Atypical chest pain unlikely cardiac, normal treadmill EKG; the patient refused to complete Myoview.  No cardiac injury on cardiac enzyme checks.  2.  Hyperglycemia in nonfasting specimen was hemoglobin A1c 5.9.   3.  Asthma in childhood and likely adult asthma which could have contributed to atypical chest pain.  4.  Hypertension. 5.  Hyperlipidemia. 6.  History of transient ischemic attacks. 7.  Bradycardia status post permanent pacemaker placement in the past.   DISCHARGE CONDITION: Stable.   DISCHARGE MEDICATIONS: The patient is to resume his outpatient medications which are:  1.  Levitra 20 mg p.o. daily as needed.  2.  Aspirin 81 mg p.o. daily. 3.  Metoprolol succinate 25 mg p.o. 1/2 tablet bedtime.  4.  Nitroglycerin 0.4 mg sublingually every 5 minutes as needed. 5.  Plavix 75 mg p.o. daily. 6.  Protonix 30 mg p.o. daily. 7.  Combivent Respimat CFC-free 100/20 1 puff 4 times a day as needed.  8.  The patient is not to take Prilosec due to interaction with clopidogrel.   HOME OXYGEN:  None.   DIET: 2 grams salt, low fat, low cholesterol, regular consistency.   ACTIVITY LIMITATIONS:   As tolerated.   FOLLOW UP:  Follow-up appointment with Dr. Kary Kos in 2 days after discharge.  CONSULTANTS: None.   RADIOLOGIC STUDIES: Chest x-ray PA and lateral on 09/21/2012 showed no evidence of acute cardiopulmonary abnormality.   HOSPITAL COURSE:  The patient is a 74 year old Caucasian male with past medical history significant for history of coronary artery disease status post stent placement, most recent stent placement done in March 2013 done at [facility] who presents to the hospital with complaints of chest pains. Please refer to Dr. Zacarias Pontes admission note done on  the 09/22/2012. On arrival to the hospital, the patient's blood pressure was 120/70, pulse was 69, respiration rate 18, temperature was 98.7, oxygen saturation was 98% on room air.  Physical exam was unremarkable.   LABORATORY AND DIAGNOSTIC DATA:   The patient's EKG showed electronic pacing at a rate of 60. Otherwise unremarkable EKG.  Chest x-ray also was unremarkable. The patient's lab data done on the day of admission, 09/21/2012, showed elevated glucose to 119, otherwise BMP was unremarkable. The patient's cardiac enzymes x 3 were negative. CBC was within normal limits.   The patient was admitted to the telemetry unit. His cardiac enzymes were cycled and those were negative for any cardiac injury. The patient had a Myoview stress test ordered, however, he was agreeable only to do the treadmill which was unremarkable. The patient's EKG did not change; however, he refused Myoview evaluation. His risk factors were evaluated checking his lipid panel and LDL was found to be 99.  The patient's total cholesterol was 162, triglycerides 187, HDL was 26. The patient was advised to continue current management of his coronary disease; however, it was felt that the patient's pain, atypical chest pain, was unlikely cardiac.  It was felt that the patient is chest pain could have been coming from COPD/asthma as the patient's breath sounds were significantly during physical exam.  It was felt that the patient may benefit from Combivent inhaler and Combivent inhaler was prescribed for him upon discharge to help him with discomfort and tightness in his lungs.  Hyperglycemia was also checked in the hospital, the patient had a hemoglobin A1c checked which was normal and did not signs of diabetes mellitus.   For hypertension, hyperlipidemia, bradycardia, history of TIAs, the patient is to continue his outpatient management. We changed some of the patient's medications, we discontinued the patient's Prilosec The patient's  vital signs and ordered for him Protonix due to interaction of Prilosec as well as Plavix.  The patient  is being discharged home with the above-mentioned medications and follow-up. His vital signs are the day of discharge: Temperature 98.2, pulse 60s, respiration rate 18, blood pressure 102/61, saturation 96% on room air at rest.   Time spent 40 minutes.   ____________________________ Theodoro Grist, MD rv:ct D: 09/22/2012 18:16:10 ET T: 09/23/2012 08:03:05 ET JOB#: [id number]  cc: Theodoro Grist, MD, <Dictator> Irven Easterly. Kary Kos, MD Theodoro Grist MD ELECTRONICALLY SIGNED 09/23/2012 18:49

## 2014-11-22 NOTE — Consult Note (Signed)
PATIENT NAME:  Clayton Sawyer, Clayton Sawyer MR#:  [id number] DATE OF BIRTH:  [birth year]  DATE OF CONSULTATION:  11/06/2013  CONSULTING PHYSICIAN:  Dionisio David, MD  INDICATION FOR CONSULTATION: Chest pain.   This is a 74 year old white male with a past medical history of PCI and stenting in October last year, who came in with chest pain. The chest pain was severe, 10/10, in the retrosternal area; described as elephant sitting on the chest, associated with diaphoresis. It started at 3:00 a.m. and was brought to the Emergency Room. Two sets of troponins are negative, but the patient continues to have pressure in the chest. The patient was scheduled for a stress test, but because of repeated episodes of chest pain, it was canceled. He actually had a stress test 4 weeks ago in the office, which was negative. Echocardiogram just had normal EF and mild mitral regurgitation. He was doing fine from October until last night when he first had episode of chest pain. The patient still has 2/10 chest pain associated with shortness of breath.   PAST MEDICAL HISTORY: As mentioned, history of PCI and stenting in October of last year, history of hypertension, history of hyperlipidemia. No history of diabetes.   SOCIAL HISTORY: No EtOH abuse or smoking.   FAMILY HISTORY: Positive for premature coronary artery disease.   PHYSICAL EXAMINATION: GENERAL: He is alert, oriented x 3, in no acute distress right now.  VITAL SIGNS: Stable.  NECK: Revealed no JVD.  LUNGS: Clear.  HEART: Regular rate, rhythm. Normal S1, S2. No audible murmur.  ABDOMEN: Soft, nontender. Positive bowel sounds.  EXTREMITIES: No pedal edema.   EKG shows AV sequential paced rhythm, 68 beats per minute. No acute ST changes. First 2 sets of troponin are negative.   ASSESSMENT AND PLAN: The patient has acute coronary syndrome/unstable angina. The patient had similar presentation in October. He normally has negative stress test, but ends up having  significant coronary artery disease with multiple risk factors for coronary artery disease and prior history of coronary artery disease with PCI and stenting. Advise proceeding with  catheterization, since he still has ongoing chest pain.   Thank you very much for referral.  ____________________________ Dionisio David, MD sak:dmm D: 11/06/2013 11:26:11 ET T: 11/06/2013 11:34:29 ET JOB#: [id number]  cc: Dionisio David, MD, <Dictator> Dionisio David MD ELECTRONICALLY SIGNED 12/06/2013 11:02

## 2014-11-22 NOTE — H&P (Signed)
PATIENT NAME:  Clayton Sawyer, Clayton Sawyer MR#:  [id number] DATE OF BIRTH:  12-Dec-1940  DATE OF ADMISSION:  11/06/2013  ADMITTING PHYSICIAN:  Gladstone Lighter, MD  PRIMARY CARE PHYSICIAN: Irven Easterly. Kary Kos, MD   PRIMARY CARDIOLOGIST: Dionisio David, MD  CHIEF COMPLAINT: Chest pain.   HISTORY OF PRESENT ILLNESS: Mr. Celaya is a very pleasant 74 year old Caucasian male with past medical history significant for coronary artery disease, status post stents, hypertension, prostate cancer, presented to the hospital secondary to chest pain that woke him up in the middle of the night. The patient states his last heart attack was in October 2014 when he had a left circumflex stent proteinuria in. Since then, he has been doing fine. Lately over the last couple of weeks, he has been having some dizzy spells, also having dyspnea on exertion, but denies any chest pain. He had chronic chest pains before, for which he is on Ranexa and felt that Ranexa is helping for him. He went to bed fine last night. He woke up in the middle of the night, felt that he had like a chest tightness, somebody was squeezing his chest and sitting on his chest. It was radiating between his shoulder blades. He took 3 nitro pills at home with minimal relief, presented to the hospital, was placed on a nitro patch and given aspirin, and his pain is still present between the shoulder blades at this time. Of note, the patient just saw Dr. Humphrey Rolls in the office 3 to 4 weeks ago, had a Myoview and echocardiogram done, which were both fine. According to patient's wife, his blockages never show up on the Myoview and he always ends up with stents. The patient said that during the episode, he was very dyspneic, was sweating, had some cough and dizzy spell as well.   PAST MEDICAL HISTORY:  1.  Coronary artery disease, status post stents.  2.  Hypertension.  3.  Gastroesophageal reflux disease.  4.  Prostate cancer, status post radiation.   PAST SURGICAL HISTORY:   1.  Cardiac stent placement.  2.  Right knee replacement surgery. 3.  Prostate radiation seed implant.  4.  Pacemaker placement.   ALLERGIES TO MEDICATIONS: LIPITOR, STATINS, AND TRICOR.   CURRENT HOME MEDICATIONS:  1.  Protonix 20 mg p.o. daily.  2.  Aspirin 81 mg p.o. daily.  3.  Plavix 75 mg p.o. daily.  4.  Ranexa 500 mg p.o. b.i.d.  5.  Sublingual nitroglycerin 0.4 mg p.r.n. for chest pain.  6.  Metoprolol succinate 12.5 mg p.o. at bedtime.   SOCIAL HISTORY: Lives at home with his wife. Steady on ambulation at baseline. No history of any smoking or alcohol use.   FAMILY HISTORY: Significant for heart disease in uncles on father's side, who had heart problems before 75 years of age.   REVIEW OF SYSTEMS:    CONSTITUTIONAL: No fever, fatigue or weakness.  EYES: No blurred vision, double vision, pain, inflammation or glaucoma.  EARS, NOSE, THROAT: No tinnitus, ear pain, hearing loss, epistaxis or discharge.  RESPIRATORY: No cough, wheeze, hemoptysis or COPD.  CARDIOVASCULAR: Positive for chest pain. No palpitations or syncope. Positive for dyspnea on exertion and orthopnea. No PND.  GASTROINTESTINAL: No nausea, vomiting, diarrhea, abdominal pain, hematemesis or melena.  GENITOURINARY: No dysuria, hematuria, renal calculus, frequency or incontinence.  ENDOCRINE: No, nocturia, polyuria. No thyroid problems. No heat or cold intolerance.  HEMATOLOGY: No anemia, easy bruising or bleeding.  SKIN: No acne, rash or lesions.  MUSCULOSKELETAL:  No neck, back, shoulder pain, arthritis or gout.  NEUROLOGIC: No numbness, weakness, CVA, TIA or seizures.  PSYCHOLOGICAL: No anxiety, insomnia or depression.   PHYSICAL EXAMINATION: VITAL SIGNS: Temperature 97.8 degrees Fahrenheit, pulse 61, respirations 18, blood pressure 157/103, pulse oximetry 98% on room air.  GENERAL: Heavily built, well-nourished male sitting in bed, not in any acute distress.  HEENT: Normocephalic, atraumatic. Pupils  equal, round, reacting to light. Anicteric sclerae. Extraocular movements intact. Oropharynx clear without erythema, mass or exudates.  NECK: Supple. No thyromegaly, JVD or carotid bruits. No lymphadenopathy. LUNGS: Moving air bilaterally. No wheeze or crackles. No use of accessory muscles for breathing.  CARDIOVASCULAR: S1, S2, regular rate and rhythm. No murmurs, rubs or gallops.  ABDOMEN: Obese, soft, nontender, nondistended. No hepatosplenomegaly. Normal bowel sounds.  EXTREMITIES: No pedal edema, no clubbing or cyanosis, 2+ dorsalis pedis pulses palpable bilaterally.  SKIN: No acne, rash or lesions.  LYMPHATICS: No cervical or inguinal lymphadenopathy.  NEUROLOGIC: Cranial nerves II through XII remain intact. No motor or sensory deficits. PSYCHOLOGICAL: The patient is awake, alert, oriented x 3.   LABORATORY, DIAGNOSTIC, AND RADIOLOGICAL DATA: WBC 7.1, hemoglobin 14.9, hematocrit 44.2, platelet count 206.   Sodium 140, potassium 3.9, chloride 110, bicarbonate 24, BUN 16, creatinine 1.16, glucose 112, and calcium of 8.1.   CK 50, CK-MB 1.5. Troponins x 2 were negative.   Chest x-ray showing minimal bibasilar opacities, likely atelectasis or scarring. No acute changes.   EKG showing paced rhythm, heart rate of 68, LVH criteria. No acute ST-T wave abnormalities.   ASSESSMENT AND PLAN: This 74 year old male with history of heart disease, status post stents, hypertension, gastroesophageal reflux disease, and prostate cancer is being admitted for unstable angina.  1.  Unstable angina with recent Myoview done as an outpatient being negative, but because of the typical nature of pain, he will likely go to cardiac catheterization either today or tomorrow. He was already seen by cardiologist, Dr. Humphrey Rolls. Continue nitroglycerin patch, troponins monitoring, and continue his cardiac medications with aspirin, Plavix, and metoprolol.  2.  Coronary artery disease, status post prior stents: Cardiac  catheterization this admission. Continue medications. NOT ON STATINS TO DO SIGNIFICANT INTOLERANCE AND SIDE EFFECTS.  3.  Hypertension: Continue metoprolol. Well-controlled blood pressure.  4.  Prostate cancer: Had radiation seeds. Currently in remission. Follows with Dr. Baruch Gouty with a yearly followup. 5.  Gastroesophageal reflux disease: Continue his Protonix.   CODE STATUS: Full code.   TIME SPENT ON ADMISSION: 50 minutes.   ____________________________ Gladstone Lighter, MD rk:jcm D: 11/06/2013 13:52:17 ET T: 11/06/2013 14:18:00 ET JOB#: [id number]  cc: Gladstone Lighter, MD, <Dictator> Dionisio David, MD Gladstone Lighter MD ELECTRONICALLY SIGNED 11/07/2013 14:10

## 2014-11-22 NOTE — Discharge Summary (Signed)
PATIENT NAME:  Clayton Sawyer, Clayton Sawyer MR#:  [id number] DATE OF BIRTH:  Aug 21, 1940  DATE OF ADMISSION:  11/06/2013 DATE OF DISCHARGE:  11/07/2013  ADMITTING PHYSICIAN: Gladstone Lighter, MD  DISCHARGING PHYSICIAN: Gladstone Lighter, MD  PRIMARY CARE PHYSICIAN: [place] Pink, MD  PRIMARY CARDIOLOGIST: Neoma Laming, MD.   [place]: Cardiology by Dr. Neoma Laming.  DISCHARGE DIAGNOSES: 1.  Unstable angina, status post cardiac catheterization and borderline LAD lesion with drug-eluting stent placement.  2.  Coronary artery disease, status post prior stents.  3.  Hypertension.  4.  Gastroesophageal reflux disease.  5.  Prostate cancer status post radiation.  DISCHARGE HOME MEDICATIONS:  1.  Protonix 20 mg p.o. daily.  2.  Aspirin 325 mg p.o. daily.  3.  Plavix 75 mg p.o. daily.  4.  Ranexa 500 mg p.o. b.i.d.  5.  Metoprolol succinate 12.5 mg p.o. at bedtime. 6.  Sublingual nitroglycerin 0.4 mg p.r.n. for chest pain.   DISCHARGE DIET: Low-sodium.  DISCHARGE ACTIVITY: As tolerated.   FOLLOWUP INSTRUCTIONS: 1.  Follow up with Dr. Neoma Laming next week as scheduled, on April 14th at 10:00 a.m.  2.  PCP followup in 2 weeks.   LABS AND IMAGING STUDIES PRIOR TO DISCHARGE: WBC 10, hemoglobin 14.7, hematocrit 44.2, platelet count 185,000.   Sodium 138, potassium 3.9, chloride 107, bicarb 27, BUN 11, creatinine 1.11, glucose 93, and calcium 8.4. Magnesium is 1.8. Troponins are negative.   Chest x-ray is showing only bibasilar atelectasis.  BRIEF HOSPITAL COURSE: Clayton Sawyer is a 74 year old Caucasian male with past medical history significant for CAD status post stents, (The last stent was placed in October 2014 ion left circumflex artery), hypertension, gastroesophageal reflux disease, and history of prostate cancer who was admitted for chest pain.  1.  Chest pain, unstable angina. The patient was also having angina equivalent with dyspnea on exertion lately. He just had a  stress test which was negative 4 weeks ago as an outpatient. He was seen by cardiology and was taken to the cardiac catheterization lab and the patient did have 70% borderline LAD stenosis, which was probably causing his symptoms, so he had a drug-eluting stent put in. The patient has been asymptomatic since then. He is already on metoprolol, nitroglycerin, and Plavix, and his aspirin has been increased to 325 mg after the stent placement. He is stable and is being discharged home.  2.  History of prostate cancer, status post radiation treatment. He follows with Dr. Baruch Sawyer from radiation oncology and is on yearly monitoring at this time.   His course has been otherwise uneventful in the hospital.   DISCHARGE CONDITION: Stable.   DISCHARGE DISPOSITION: Home.   TIME SPENT ON DISCHARGE: 40 minutes.   ____________________________ Gladstone Lighter, MD rk:sb D: 11/07/2013 09:04:32 ET T: 11/07/2013 09:20:21 ET JOB#: [id number]  cc: Gladstone Lighter, MD, <Dictator> Dionisio David, MD Irven Easterly. Kary Kos, MD Gladstone Lighter MD ELECTRONICALLY SIGNED 11/07/2013 14:10

## 2016-04-11 ENCOUNTER — Emergency Department: Payer: Medicare Other

## 2016-04-11 ENCOUNTER — Observation Stay: Admit: 2016-04-11 | Payer: Medicare Other

## 2016-04-11 ENCOUNTER — Observation Stay
Admission: EM | Admit: 2016-04-11 | Discharge: 2016-04-12 | Disposition: A | Discharge status: Home / Self Care | Payer: Medicare Other | Attending: Specialist | Admitting: Specialist

## 2016-04-11 ENCOUNTER — Encounter: Payer: Self-pay | Admitting: Emergency Medicine

## 2016-04-11 DIAGNOSIS — Z7902 Long term (current) use of antithrombotics/antiplatelets: Secondary | ICD-10-CM | POA: Insufficient documentation

## 2016-04-11 DIAGNOSIS — R0789 Other chest pain: Principal | ICD-10-CM | POA: Insufficient documentation

## 2016-04-11 DIAGNOSIS — I252 Old myocardial infarction: Secondary | ICD-10-CM | POA: Diagnosis not present

## 2016-04-11 DIAGNOSIS — I10 Essential (primary) hypertension: Secondary | ICD-10-CM | POA: Diagnosis not present

## 2016-04-11 DIAGNOSIS — E875 Hyperkalemia: Secondary | ICD-10-CM | POA: Insufficient documentation

## 2016-04-11 DIAGNOSIS — I200 Unstable angina: Secondary | ICD-10-CM | POA: Diagnosis present

## 2016-04-11 DIAGNOSIS — I340 Nonrheumatic mitral (valve) insufficiency: Secondary | ICD-10-CM | POA: Diagnosis not present

## 2016-04-11 DIAGNOSIS — I272 Other secondary pulmonary hypertension: Secondary | ICD-10-CM | POA: Diagnosis not present

## 2016-04-11 DIAGNOSIS — Z955 Presence of coronary angioplasty implant and graft: Secondary | ICD-10-CM | POA: Insufficient documentation

## 2016-04-11 DIAGNOSIS — I2510 Atherosclerotic heart disease of native coronary artery without angina pectoris: Secondary | ICD-10-CM | POA: Diagnosis not present

## 2016-04-11 DIAGNOSIS — Z7982 Long term (current) use of aspirin: Secondary | ICD-10-CM | POA: Insufficient documentation

## 2016-04-11 DIAGNOSIS — Z888 Allergy status to other drugs, medicaments and biological substances status: Secondary | ICD-10-CM | POA: Insufficient documentation

## 2016-04-11 DIAGNOSIS — Z950 Presence of cardiac pacemaker: Secondary | ICD-10-CM | POA: Insufficient documentation

## 2016-04-11 DIAGNOSIS — R079 Chest pain, unspecified: Secondary | ICD-10-CM | POA: Diagnosis present

## 2016-04-11 HISTORY — DX: Old myocardial infarction: I25.2

## 2016-04-11 HISTORY — DX: Essential (primary) hypertension: I10

## 2016-04-11 LAB — CBC
HEMATOCRIT: 47.1 % (ref 40.0–52.0)
Hemoglobin: 16.4 g/dL (ref 13.0–18.0)
MCH: 31.2 pg (ref 26.0–34.0)
MCHC: 34.8 g/dL (ref 32.0–36.0)
MCV: 89.7 fL (ref 80.0–100.0)
Platelets: 218 10*3/uL (ref 150–440)
RBC: 5.25 MIL/uL (ref 4.40–5.90)
RDW: 14.7 % — AB (ref 11.5–14.5)
WBC: 10.3 10*3/uL (ref 3.8–10.6)

## 2016-04-11 LAB — PROTIME-INR
INR: 1.01
Prothrombin Time: 13.3 seconds (ref 11.4–15.2)

## 2016-04-11 LAB — HEPARIN LEVEL (UNFRACTIONATED): HEPARIN UNFRACTIONATED: 0.16 [IU]/mL — AB (ref 0.30–0.70)

## 2016-04-11 LAB — COMPREHENSIVE METABOLIC PANEL
ALT: 22 U/L (ref 17–63)
AST: 27 U/L (ref 15–41)
Albumin: 4.3 g/dL (ref 3.5–5.0)
Alkaline Phosphatase: 76 U/L (ref 38–126)
Anion gap: 7 (ref 5–15)
BILIRUBIN TOTAL: 1.1 mg/dL (ref 0.3–1.2)
BUN: 21 mg/dL — AB (ref 6–20)
CO2: 25 mmol/L (ref 22–32)
CREATININE: 1.19 mg/dL (ref 0.61–1.24)
Calcium: 9.3 mg/dL (ref 8.9–10.3)
Chloride: 107 mmol/L (ref 101–111)
GFR calc Af Amer: >60 mL/min (ref >60)
GFR, EST NON AFRICAN AMERICAN: 58 mL/min — AB (ref >60)
Glucose, Bld: 124 mg/dL — ABNORMAL HIGH (ref 65–99)
POTASSIUM: 5 mmol/L (ref 3.5–5.1)
Sodium: 139 mmol/L (ref 135–145)
TOTAL PROTEIN: 7 g/dL (ref 6.5–8.1)

## 2016-04-11 LAB — BASIC METABOLIC PANEL
Anion gap: 6 (ref 5–15)
BUN: 20 mg/dL (ref 6–20)
CO2: 27 mmol/L (ref 22–32)
CREATININE: 1.05 mg/dL (ref 0.61–1.24)
Calcium: 9.3 mg/dL (ref 8.9–10.3)
Chloride: 105 mmol/L (ref 101–111)
GFR calc Af Amer: >60 mL/min (ref >60)
Glucose, Bld: 122 mg/dL — ABNORMAL HIGH (ref 65–99)
Potassium: 4.6 mmol/L (ref 3.5–5.1)
SODIUM: 138 mmol/L (ref 135–145)

## 2016-04-11 LAB — APTT: APTT: 31 s (ref 24–36)

## 2016-04-11 LAB — TROPONIN I
Troponin I: <0.03 ng/mL (ref <0.03)
Troponin I: <0.03 ng/mL (ref <0.03)
Troponin I: <0.03 ng/mL (ref <0.03)

## 2016-04-11 MED ORDER — NITROGLYCERIN 0.4 MG SL SUBL
0.4000 mg | SUBLINGUAL_TABLET | SUBLINGUAL | Status: AC | PRN
  Administered 2016-04-11 (×3): 0.4 mg via SUBLINGUAL
  Filled 2016-04-11 (×3): qty 1

## 2016-04-11 MED ORDER — ONDANSETRON HCL 4 MG/2ML IJ SOLN
4.0000 mg | Freq: Once | INTRAMUSCULAR | Status: AC
  Administered 2016-04-11: 4 mg via INTRAVENOUS
  Filled 2016-04-11: qty 2

## 2016-04-11 MED ORDER — NITROGLYCERIN IN D5W 200-5 MCG/ML-% IV SOLN
0.0000 ug/min | Freq: Once | INTRAVENOUS | Status: AC
  Administered 2016-04-11: 5 ug/min via INTRAVENOUS
  Filled 2016-04-11: qty 250

## 2016-04-11 MED ORDER — SODIUM CHLORIDE 0.9 % IV SOLN
INTRAVENOUS | Status: DC
  Administered 2016-04-11 – 2016-04-12 (×2): via INTRAVENOUS

## 2016-04-11 MED ORDER — METOPROLOL SUCCINATE ER 25 MG PO TB24
25.0000 mg | ORAL_TABLET | Freq: Every day | ORAL | Status: DC
  Administered 2016-04-12: 25 mg via ORAL
  Filled 2016-04-11: qty 1

## 2016-04-11 MED ORDER — ZOLPIDEM TARTRATE 5 MG PO TABS: 5.0000 mg | ORAL_TABLET | Freq: Every evening | ORAL | Status: DC | PRN

## 2016-04-11 MED ORDER — ALPRAZOLAM 0.25 MG PO TABS: 0.2500 mg | ORAL_TABLET | Freq: Two times a day (BID) | ORAL | Status: DC | PRN

## 2016-04-11 MED ORDER — ACETAMINOPHEN 325 MG PO TABS
650.0000 mg | ORAL_TABLET | ORAL | Status: DC | PRN
  Administered 2016-04-11: 650 mg via ORAL
  Filled 2016-04-11: qty 2

## 2016-04-11 MED ORDER — MORPHINE SULFATE (PF) 2 MG/ML IV SOLN
2.0000 mg | INTRAVENOUS | Status: DC | PRN
  Administered 2016-04-11: 2 mg via INTRAVENOUS
  Filled 2016-04-11: qty 1

## 2016-04-11 MED ORDER — ASPIRIN EC 81 MG PO TBEC
81.0000 mg | DELAYED_RELEASE_TABLET | Freq: Every day | ORAL | Status: DC
  Administered 2016-04-12: 81 mg via ORAL
  Filled 2016-04-11 (×2): qty 1

## 2016-04-11 MED ORDER — HEPARIN (PORCINE) IN NACL 100-0.45 UNIT/ML-% IJ SOLN
1500.0000 [IU]/h | INTRAMUSCULAR | Status: DC
  Administered 2016-04-11: 1200 [IU]/h via INTRAVENOUS
  Administered 2016-04-12: 1500 [IU]/h via INTRAVENOUS
  Filled 2016-04-11 (×2): qty 250

## 2016-04-11 MED ORDER — NITROGLYCERIN IN D5W 200-5 MCG/ML-% IV SOLN
0.0000 ug/min | INTRAVENOUS | Status: DC
  Administered 2016-04-11: 25 ug/min via INTRAVENOUS
  Administered 2016-04-11: 20 ug/min via INTRAVENOUS
  Filled 2016-04-11: qty 250

## 2016-04-11 MED ORDER — PANTOPRAZOLE SODIUM 40 MG PO TBEC
40.0000 mg | DELAYED_RELEASE_TABLET | Freq: Every day | ORAL | Status: DC
  Administered 2016-04-12: 40 mg via ORAL
  Filled 2016-04-11: qty 1

## 2016-04-11 MED ORDER — HEPARIN BOLUS VIA INFUSION
4000.0000 [IU] | Freq: Once | INTRAVENOUS | Status: AC
  Administered 2016-04-11: 4000 [IU] via INTRAVENOUS
  Filled 2016-04-11: qty 4000

## 2016-04-11 MED ORDER — ASPIRIN EC 325 MG PO TBEC
325.0000 mg | DELAYED_RELEASE_TABLET | Freq: Once | ORAL | Status: AC
  Administered 2016-04-11: 325 mg via ORAL
  Filled 2016-04-11: qty 1

## 2016-04-11 MED ORDER — GI COCKTAIL ~~LOC~~
30.0000 mL | Freq: Four times a day (QID) | ORAL | Status: DC | PRN
  Administered 2016-04-11: 30 mL via ORAL
  Filled 2016-04-11 (×2): qty 30

## 2016-04-11 MED ORDER — ONDANSETRON HCL 4 MG/2ML IJ SOLN
4.0000 mg | Freq: Four times a day (QID) | INTRAMUSCULAR | Status: DC | PRN
  Administered 2016-04-11: 4 mg via INTRAVENOUS
  Filled 2016-04-11: qty 2

## 2016-04-11 MED ORDER — CLOPIDOGREL BISULFATE 75 MG PO TABS
75.0000 mg | ORAL_TABLET | Freq: Every day | ORAL | Status: DC
  Administered 2016-04-12: 75 mg via ORAL
  Filled 2016-04-11 (×2): qty 1

## 2016-04-11 NOTE — Progress Notes (Signed)
Pt. admitted to unit, [id number] from ED, report from [place], [place]. Oriented to room, call bell, Ascom phones and staff. Bed in low position. Fall safety plan reviewed, yellow non-skid socks in place, bed alarm on. Full assessment to Epic; skin assessed with Earlyne Iba., RN. Telemetry box verified with CCMD and Gay Filler., RN: [id number].   Patient reporting 5/10 back & chest pressure, nitro gtt increased to 20mcg/min, VSS. Heparin gtt infusing as well. Verified with Tery Sanfilippo., RNs.   Patient reports having taking all his daily medications at home, states he is not sure if he threw them up or not when he vomited earlier but is in agreement that he does not want to take them again this afternoon.   Will continue to monitor.

## 2016-04-11 NOTE — ED Triage Notes (Signed)
Chest pressure since 5 am, denies SOB.

## 2016-04-11 NOTE — ED Notes (Signed)
Assisted patient to the restroom to urinate. Patient did great getting up and walking a few steps to the in room toilet.

## 2016-04-11 NOTE — Progress Notes (Signed)
Schedule of cath lab is full today and being told tomorrow at 11:30 cath will be done. Patient has chest pain but troponins not elevated and no acute EKG changes, will last stress test in 2016 normal, but does have CAD/Stenting thus will do cath.

## 2016-04-11 NOTE — H&P (Signed)
[place] @ [facility] Admission History and Physical Harvie Bridge, D.O.  ---------------------------------------------------------------------------------------------------------------------   PATIENT NAME: Clayton Sawyer MR#: [id number] DATE OF BIRTH: 1941/05/25 DATE OF ADMISSION: 04/11/2016 PRIMARY CARE PHYSICIAN: Maryland Pink, MD  REQUESTING/REFERRING PHYSICIAN: ED Dr. Darl Householder  CHIEF COMPLAINT: Chief Complaint  Patient presents with  . Chest Pain    HISTORY OF PRESENT ILLNESS: Clayton Sawyer is a 75 y.o. male with a known history of Coronary artery disease with 6 stents, hypertension was in a usual state of health until 5 AM this morning when he developed upper abdominal and right-sided chest pain described as localized pressure, 8 out of 10, occurred at rest and was not relieved by 3 nitroglycerin. He states that he did take his aspirin 81 mg this morning but vomited about 20 minutes later 1. He denied any shortness of breath, diaphoresis, palpitations. Of note he had similar symptoms which resulted in cardiac catheterization with placement of stents in 2013 and has subsequent follow with Dr. Humphrey Rolls with annual stress tests which have since been unremarkable.  Otherwise there has been no change in status. Patient has been taking medication as prescribed and there has been no recent change in medication, exercise or diet.  There has been no recent illness, travel or sick contacts.    Patient denies fevers/chills, weakness, dizziness, , shortness of breath, constipation or diarrhea, abdominal pain, dysuria/frequency, changes in mental status.   EMS/ED COURSE:   Patient received aspirin 325, and nitroglycerin and heparin drip. He was seen by Dr. Humphrey Rolls.    PAST MEDICAL HISTORY: Past Medical History:  Diagnosis Date  . Hypertension   . MI, old       PAST SURGICAL HISTORY: Past Surgical History:  Procedure Laterality Date  . knee replacements    . stents      cardiac stents      SOCIAL HISTORY: Social History  Substance Use Topics  . Smoking status: Never Smoker  . Smokeless tobacco: Not on file  . Alcohol use No      FAMILY HISTORY: Multiple male family members on his father's side with coronary artery disease and MI.   MEDICATIONS AT HOME: Prior to Admission medications   Medication Sig Start Date End Date Taking? Authorizing Provider  aspirin 81 MG tablet Take 1 tablet by mouth daily. 04/13/10  Yes Historical Provider, MD  clopidogrel (PLAVIX) 75 MG tablet Take 1 tablet by mouth daily.   Yes Historical Provider, MD  LIVALO 4 MG TABS Take 1 tablet by mouth daily. 03/17/16  Yes Historical Provider, MD  metoprolol succinate (TOPROL-XL) 25 MG 24 hr tablet Take 1 tablet by mouth daily. 02/07/13  Yes Historical Provider, MD  pantoprazole (PROTONIX) 40 MG tablet Take 1 tablet by mouth daily. 05/29/15  Yes Historical Provider, MD      DRUG ALLERGIES: Allergies  Allergen Reactions  . Statins Other (See Comments)    Body aches     REVIEW OF SYSTEMS: CONSTITUTIONAL: No fever/chills, fatigue, weakness, weight gain/loss, headache EYES: No blurry or double vision. ENT: No tinnitus, postnasal drip, redness or soreness of the oropharynx. RESPIRATORY: No cough, wheeze, hemoptysis, dyspnea. CARDIOVASCULAR: Positive chest pain. Negative orthopnea, palpitations, syncope. GASTROINTESTINAL: Positive nausea and vomiting. Negative constipation, diarrhea, abdominal pain, hematemesis, melena or hematochezia. GENITOURINARY: No dysuria or hematuria. ENDOCRINE: No polyuria or nocturia. No heat or cold intolerance. HEMATOLOGY: No anemia, bruising, bleeding. INTEGUMENTARY: No rashes, ulcers, lesions. MUSCULOSKELETAL: No arthritis, swelling, gout. NEUROLOGIC: No numbness, tingling, weakness or ataxia. No seizure-type  activity. PSYCHIATRIC: No anxiety, depression, insomnia.  PHYSICAL EXAMINATION: VITAL SIGNS: Blood pressure 111/67, pulse 66,  temperature 98.1 F (36.7 C), temperature source Oral, resp. rate 18, height 6\' 9"  (2.057 m), weight 108 kg (238 lb), SpO2 97 %.  GENERAL: 75 y.o.-year-old white male patient, well-developed, well-nourished lying in the bed in no acute distress.  Pleasant and cooperative.   HEENT: Head atraumatic, normocephalic. Pupils equal, round, reactive to light and accommodation. No scleral icterus. Extraocular muscles intact. Nares are patent. Oropharynx is clear. Mucus membranes moist. NECK: Supple, full range of motion. No JVD, no bruit heard. No thyroid enlargement, no tenderness, no cervical lymphadenopathy. CHEST: Normal breath sounds bilaterally. No wheezing, rales, rhonchi or crackles. No use of accessory muscles of respiration.  No reproducible chest wall tenderness.  CARDIOVASCULAR: S1, S2 normal. No murmurs, rubs, or gallops. Cap refill <2 seconds. ABDOMEN: Soft, nontender, nondistended. No rebound, guarding, rigidity. Normoactive bowel sounds present in all four quadrants. No organomegaly or mass. EXTREMITIES: Full range of motion. No pedal edema, cyanosis, or clubbing. NEUROLOGIC: Cranial nerves II through XII are grossly intact with no focal sensorimotor deficit. Muscle strength 5/5 in all extremities. Sensation intact. Gait not checked. PSYCHIATRIC: The patient is alert and oriented x 3. Normal affect, mood, thought content. SKIN: Warm, dry, and intact without obvious rash, lesion, or ulcer.  LABORATORY PANEL:  CBC  Recent Labs Lab 04/11/16 1047  WBC 10.3  HGB 16.4  HCT 47.1  PLT 218   ----------------------------------------------------------------------------------------------------------------- Chemistries  Recent Labs Lab 04/11/16 1047  NA 139  K 5.0  CL 107  CO2 25  GLUCOSE 124*  BUN 21*  CREATININE 1.19  CALCIUM 9.3  AST 27  ALT 22  ALKPHOS 76  BILITOT 1.1    ------------------------------------------------------------------------------------------------------------------ Cardiac Enzymes  Recent Labs Lab 04/11/16 1047  TROPONINI <0.03   ------------------------------------------------------------------------------------------------------------------  RADIOLOGY: Dg Chest 2 View  Result Date: 04/11/2016 CLINICAL DATA:  Patient reports central chest pain onset today. Denies SOB. Hx HTN, pacemaker insertion, multiple stent placements. Non-smoker. EXAM: CHEST  2 VIEW COMPARISON:  radiograph 11/06/2013 FINDINGS: RIGHT-sided pacemaker with continuous leads overlies normal cardiac silhouette. LEFT basilar atelectasis or scarring unchanged. No effusion. Degenerative osteophytosis of the thoracic spine. IMPRESSION: No acute cardiopulmonary process. Electronically Signed   By: Suzy Bouchard M.D.   On: 04/11/2016 11:40    EKG: Atrial paced at 66 bpm with nonspecific ST and T wave changes.  IMPRESSION AND PLAN:  This is a 75 y.o. male with a history of artery artery disease status post 6 stents, hypertension now being admitted with: 1. Acute coronary syndrome, rule out MI. Patient was seen in consultation by Dr. Humphrey Rolls while in the emergency department and plan is for cardiac catheterization in the morning. He was started on heparin drip and received aspirin, nitroglycerin. He has already taken his Plavix today and is reportedly allergic to statins. Trend troponins, check TSH and lipids. Nothing by mouth after midnight. Continue aspirin 81 daily Plavix 75 daily, Livalo 4 mg daily. 2. Hyperkalemia, mild. Will recheck BMP with a.m. labs. 3. Hypertension-continue beta blocker 4. GERD-continue Protonix.   Diet/Nutrition: Heart healthy Fluids: Hep-Lock DVT Px: Heparin, SCDs and early ambulation Code Status: Full  All the records are reviewed and case discussed with ED provider. Management plans discussed with the patient and/or family who express  understanding and agree with plan of care.   TOTAL TIME TAKING CARE OF THIS PATIENT: 50 minutes.   Clayton Sawyer D.O. on 04/11/2016 at 2:44 PM Between  7am to 6pm - Pager - [phone] After 6pm go to [email or web address] - Proofreader [facility] Office [phone] CC: Primary care physician; Maryland Pink, MD     Note: This dictation was prepared with Dragon dictation along with smaller phrase technology. Any transcriptional errors that result from this process are unintentional.

## 2016-04-11 NOTE — ED Provider Notes (Signed)
[facility] Provider Note   CSN: [id number] Arrival date & time: 04/11/16  1009     History   Chief Complaint Chief Complaint  Patient presents with  . Chest Pain    HPI Clayton Sawyer is a 75 y.o. male hx of CAD s/p 6 stents, HTN, here presenting with chest pain, chest pressure. Patient states that he woke around 5 AM this morning has a right-sided chest pain associated with some pressure. He denies any radiation to the pain. States that he has previous cardiac stents and most recently was 2013. He has been follow with Dr. Humphrey Sawyer from cardiology and has annual stress tests and most recently this year that was unremarkable. He states that he gets many different symptoms with prior heart attacks. He states that he is 8/10 pain currently. Took 1 nitro today and ASA 81 mg this morning but vomit them up and didn't help with his pain.   The history is provided by the patient.    Past Medical History:  Diagnosis Date  . Hypertension   . MI, old     There are no active problems to display for this patient.   Past Surgical History:  Procedure Laterality Date  . knee replacements    . stents     cardiac stents       Home Medications    Prior to Admission medications   Not on File    Family History No family history on file.  Social History Social History  Substance Use Topics  . Smoking status: Never Smoker  . Smokeless tobacco: Not on file  . Alcohol use No     Allergies   Statins   Review of Systems Review of Systems  Cardiovascular: Positive for chest pain.  All other systems reviewed and are negative.    Physical Exam Updated Vital Signs BP (!) 138/52 (BP Location: Right Arm)   Pulse 64   Temp 98.1 F (36.7 C) (Oral)   Resp 18   Ht 6\' 9"  (2.057 m)   Wt 238 lb (108 kg)   SpO2 97%   BMI 25.50 kg/m   Physical Exam  Constitutional: He is oriented to person, place, and time.  Slightly uncomfortable   HENT:  Head:  Normocephalic.  Eyes: EOM are normal. Pupils are equal, round, and reactive to light.  Neck: Normal range of motion. Neck supple.  Cardiovascular: Normal rate, regular rhythm and normal heart sounds.   Pulmonary/Chest: Effort normal and breath sounds normal. No respiratory distress. He has no wheezes.  No reproducible chest tenderness   Abdominal: Soft. Bowel sounds are normal. He exhibits no distension. There is no tenderness. There is no guarding.  Musculoskeletal: Normal range of motion. He exhibits no edema.  Neurological: He is alert and oriented to person, place, and time.  Skin: Skin is warm.  Psychiatric: He has a normal mood and affect.  Nursing note and vitals reviewed.    ED Treatments / Results  Labs (all labs ordered are listed, but only abnormal results are displayed) Labs Reviewed  CBC - Abnormal; Notable for the following:       Result Value   RDW 14.7 (*)    All other components within normal limits  COMPREHENSIVE METABOLIC PANEL - Abnormal; Notable for the following:    Glucose, Bld 124 (*)    BUN 21 (*)    GFR calc non Af Amer 58 (*)    All other components within normal limits  TROPONIN I  EKG  EKG Interpretation None       ED ECG REPORT I, Clayton Sawyer, the attending physician, personally viewed and interpreted this ECG.   Date: 04/11/2016  EKG Time: 10: 22 am  Rate: 66  Rhythm: normal EKG, normal sinus rhythm, paced  Axis: normal  Intervals:none  ST&T Change: nonspecific   Radiology Dg Chest 2 View  Result Date: 04/11/2016 CLINICAL DATA:  Patient reports central chest pain onset today. Denies SOB. Hx HTN, pacemaker insertion, multiple stent placements. Non-smoker. EXAM: CHEST  2 VIEW COMPARISON:  radiograph 11/06/2013 FINDINGS: RIGHT-sided pacemaker with continuous leads overlies normal cardiac silhouette. LEFT basilar atelectasis or scarring unchanged. No effusion. Degenerative osteophytosis of the thoracic spine. IMPRESSION: No acute  cardiopulmonary process. Electronically Signed   By: Clayton Sawyer M.D.   On: 04/11/2016 11:40    Procedures Procedures (including critical care time)  CRITICAL CARE Performed by: Clayton Sawyer   Total critical care time: 30 minutes  Critical care time was exclusive of separately billable procedures and treating other patients.  Critical care was necessary to treat or prevent imminent or life-threatening deterioration.  Critical care was time spent personally by me on the following activities: development of treatment plan with patient and/or surrogate as well as nursing, discussions with consultants, evaluation of patient's response to treatment, examination of patient, obtaining history from patient or surrogate, ordering and performing treatments and interventions, ordering and review of laboratory studies, ordering and review of radiographic studies, pulse oximetry and re-evaluation of patient's condition.  Medications Ordered in ED Medications  nitroGLYCERIN 50 mg in dextrose 5 % 250 mL (0.2 mg/mL) infusion (not administered)  nitroGLYCERIN (NITROSTAT) SL tablet 0.4 mg (0.4 mg Sublingual Given 04/11/16 1204)  aspirin EC tablet 325 mg (325 mg Oral Given 04/11/16 1144)  ondansetron (ZOFRAN) injection 4 mg (4 mg Intravenous Given 04/11/16 1143)     Initial Impression / Assessment and Plan / ED Course  I have reviewed the triage vital signs and the nursing notes.  Pertinent labs & imaging results that were available during my care of the patient were reviewed by me and considered in my medical decision making (see chart for details).  Clinical Course    [name] is a 75 y.o. male here with chest pain. Has 6 stents in the past. High risk for ACS or unstable angina. EKG paced, no obvious stemi. Still has pain so will get labs, CXR, trop. Will likely admit for unstable angina.   12:40 PM Still has 5/10 pain despite nitro. Consulted Dr. Humphrey Sawyer from cardiology, who recommend  nitro and heparin drip. He will cath patient later today.    Final Clinical Impressions(s) / ED Diagnoses   Final diagnoses:  None    New Prescriptions New Prescriptions   No medications on file     Clayton Freeze, MD 04/11/16 1240

## 2016-04-11 NOTE — Progress Notes (Signed)
Clayton Sawyer is a 75 y.o. male  [id number]  Primary Cardiologist: Clayton Sawyer Reason for Consultation: Chest pain  HPI: This is a 75 year old white male with a past medical history hypertension coronary artery disease history of myocardial infarction presented to the hospital with chest pain pressure type associated with shortness of breath and diaphoresis. Patient received 3 nitroglycerin and patient still has chest pain.   Review of Systems: No orthopnea PND or leg swelling   Past Medical History:  Diagnosis Date  . Hypertension   . MI, old      (Not in a hospital admission)   . nitroGLYCERIN  0-200 mcg/min Intravenous Once    Infusions:    Allergies  Allergen Reactions  . Statins Other (See Comments)    Body aches    Social History   Social History  . Marital status: Married    Spouse name: N/A  . Number of children: N/A  . Years of education: N/A   Occupational History  . Not on file.   Social History Main Topics  . Smoking status: Never Smoker  . Smokeless tobacco: Not on file  . Alcohol use No  . Drug use: Unknown  . Sexual activity: Not on file   Other Topics Concern  . Not on file   Social History Narrative  . No narrative on file    No family history on file.  PHYSICAL EXAM: Vitals:   04/11/16 1209 04/11/16 1246  BP: 123/67 127/62  Pulse: 63 65  Resp: 18 18  Temp:      No intake or output data in the 24 hours ending 04/11/16 1248  General:  Well appearing. No respiratory difficulty HEENT: normal Neck: supple. no JVD. Carotids 2+ bilat; no bruits. No lymphadenopathy or thryomegaly appreciated. Cor: PMI nondisplaced. Regular rate & rhythm. No rubs, gallops or murmurs. Lungs: clear Abdomen: soft, nontender, nondistended. No hepatosplenomegaly. No bruits or masses. Good bowel sounds. Extremities: no cyanosis, clubbing, rash, edema Neuro: alert & oriented x 3, cranial nerves grossly intact. moves all 4 extremities w/o  difficulty. Affect pleasant.  [id number] paced rhythm with nonspecific ST-T changes. Results for orders placed or performed during the hospital encounter of 04/11/16 (from the past 24 hour(s))  CBC     Status: Abnormal   Collection Time: 04/11/16 10:47 AM  Result Value Ref Range   WBC 10.3 3.8 - 10.6 K/uL   RBC 5.25 4.40 - 5.90 MIL/uL   Hemoglobin 16.4 13.0 - 18.0 g/dL   HCT 47.1 40.0 - 52.0 %   MCV 89.7 80.0 - 100.0 fL   MCH 31.2 26.0 - 34.0 pg   MCHC 34.8 32.0 - 36.0 g/dL   RDW 14.7 (H) 11.5 - 14.5 %   Platelets 218 150 - 440 K/uL  Comprehensive metabolic panel     Status: Abnormal   Collection Time: 04/11/16 10:47 AM  Result Value Ref Range   Sodium 139 135 - 145 mmol/L   Potassium 5.0 3.5 - 5.1 mmol/L   Chloride 107 101 - 111 mmol/L   CO2 25 22 - 32 mmol/L   Glucose, Bld 124 (H) 65 - 99 mg/dL   BUN 21 (H) 6 - 20 mg/dL   Creatinine, Ser 1.19 0.61 - 1.24 mg/dL   Calcium 9.3 8.9 - 10.3 mg/dL   Total Protein 7.0 6.5 - 8.1 g/dL   Albumin 4.3 3.5 - 5.0 g/dL   AST 27 15 - 41 U/L   ALT 22 17 - 63  U/L   Alkaline Phosphatase 76 38 - 126 U/L   Total Bilirubin 1.1 0.3 - 1.2 mg/dL   GFR calc non Af Amer 58 (L) >60 mL/min   GFR calc Af Amer >60 >60 mL/min   Anion gap 7 5 - 15  Troponin I     Status: None   Collection Time: 04/11/16 10:47 AM  Result Value Ref Range   Troponin I <0.03 <0.03 ng/mL   Dg Chest 2 View  Result Date: 04/11/2016 CLINICAL DATA:  Patient reports central chest pain onset today. Denies SOB. Hx HTN, pacemaker insertion, multiple stent placements. Non-smoker. EXAM: CHEST  2 VIEW COMPARISON:  radiograph 11/06/2013 FINDINGS: RIGHT-sided pacemaker with continuous leads overlies normal cardiac silhouette. LEFT basilar atelectasis or scarring unchanged. No effusion. Degenerative osteophytosis of the thoracic spine. IMPRESSION: No acute cardiopulmonary process. Electronically Signed   By: Clayton Sawyer M.D.   On: 04/11/2016 11:40     ASSESSMENT AND PLAN:Acute  coronary syndrome with no relief after 3 nitros, admit the patient to rule out MI. We will set up for cardiac catheterization in the meantime patient should be started on IV heparin aspirin and nitrates. Patient was explained risks and benefits of cardiac catheterization including stroke heart attack death and bleeding and patient has agreed to the procedure.  Clayton Sawyer A

## 2016-04-11 NOTE — Progress Notes (Signed)
ANTICOAGULATION CONSULT NOTE - Initial Consult  Pharmacy Consult for heparin  Indication: chest pain/ACS  Allergies  Allergen Reactions  . Statins Other (See Comments)    Body aches    Patient Measurements: Height: 6\' 9"  (205.7 cm) Weight: 238 lb (108 kg) IBW/kg (Calculated) : 98.3 Heparin Dosing Weight: 94kg  Vital Signs: Temp: 98.1 F (36.7 C) (09/11 1024) Temp Source: Oral (09/11 1024) BP: 127/62 (09/11 1246) Pulse Rate: 65 (09/11 1246)  Labs:  Recent Labs  04/11/16 1047  HGB 16.4  HCT 47.1  PLT 218  CREATININE 1.19  TROPONINI <0.03    Estimated Creatinine Clearance: 74.6 mL/min (by C-G formula based on SCr of 1.19 mg/dL).   Medical History: Past Medical History:  Diagnosis Date  . Hypertension   . MI, old     Assessment: 75 yo male with PMH of HTN, CAD, and MI. Pharmacy consulted for heparin dosing and monitoring for Chest Pain/ACS. Patient reports he does not take any anticoagulants at home.   Goal of Therapy:  Heparin level 0.3-0.7 units/ml Monitor platelets by anticoagulation protocol: Yes   Plan:  Patient DW is 94kg. Baseline aPTT and PT/INR ordered.  Give 4000 units bolus x 1 Start heparin infusion at 1200 units/hr Check anti-Xa level in 8 hours and daily while on heparin Continue to monitor H&H and platelets  Nancy Fetter, PharmD Clinical Pharmacist 04/11/2016 1:00 PM

## 2016-04-11 NOTE — ED Notes (Signed)
Dr. Kahn at bedside

## 2016-04-11 NOTE — ED Notes (Signed)
Per patient's report, chest pain is unchanged with nitro drip, remains at 5/10.

## 2016-04-12 ENCOUNTER — Observation Stay
Admit: 2016-04-12 | Discharge: 2016-04-12 | Disposition: A | Discharge status: Home / Self Care | Payer: Medicare Other | Attending: Cardiovascular Disease | Admitting: Cardiovascular Disease

## 2016-04-12 ENCOUNTER — Encounter
Admission: EM | Disposition: A | Discharge status: Home / Self Care | Payer: Self-pay | Source: Home / Self Care | Attending: Emergency Medicine

## 2016-04-12 HISTORY — PX: CARDIAC CATHETERIZATION: SHX172

## 2016-04-12 LAB — CBC
HCT: 38.5 % — ABNORMAL LOW (ref 40.0–52.0)
HEMOGLOBIN: 13.7 g/dL (ref 13.0–18.0)
MCH: 31.7 pg (ref 26.0–34.0)
MCHC: 35.5 g/dL (ref 32.0–36.0)
MCV: 89.4 fL (ref 80.0–100.0)
PLATELETS: 174 10*3/uL (ref 150–440)
RBC: 4.31 MIL/uL — AB (ref 4.40–5.90)
RDW: 14.4 % (ref 11.5–14.5)
WBC: 8.5 10*3/uL (ref 3.8–10.6)

## 2016-04-12 LAB — ECHOCARDIOGRAM COMPLETE
HEIGHTINCHES: 69 in
WEIGHTICAEL: 3750.4 [oz_av]

## 2016-04-12 LAB — PROTIME-INR
INR: 1.18
PROTHROMBIN TIME: 15.1 s (ref 11.4–15.2)

## 2016-04-12 LAB — HEPARIN LEVEL (UNFRACTIONATED): HEPARIN UNFRACTIONATED: 0.62 [IU]/mL (ref 0.30–0.70)

## 2016-04-12 SURGERY — LEFT HEART CATH AND CORONARY ANGIOGRAPHY
Anesthesia: Moderate Sedation | Laterality: Right

## 2016-04-12 MED ORDER — ASPIRIN 81 MG PO CHEW: 81.0000 mg | CHEWABLE_TABLET | ORAL | Status: DC

## 2016-04-12 MED ORDER — MIDAZOLAM HCL 2 MG/2ML IJ SOLN
INTRAMUSCULAR | Status: DC | PRN
  Administered 2016-04-12: 1 mg via INTRAVENOUS

## 2016-04-12 MED ORDER — SODIUM CHLORIDE 0.9% FLUSH: 3.0000 mL | INTRAVENOUS | Status: DC | PRN

## 2016-04-12 MED ORDER — SODIUM CHLORIDE 0.9 % WEIGHT BASED INFUSION: 3.0000 mL/kg/h | INTRAVENOUS | Status: DC

## 2016-04-12 MED ORDER — SODIUM CHLORIDE 0.9 % IV SOLN: 250.0000 mL | INTRAVENOUS | Status: DC | PRN

## 2016-04-12 MED ORDER — SODIUM CHLORIDE 0.9% FLUSH: 3.0000 mL | Freq: Two times a day (BID) | INTRAVENOUS | Status: DC

## 2016-04-12 MED ORDER — SODIUM CHLORIDE 0.9 % WEIGHT BASED INFUSION: 1.0000 mL/kg/h | INTRAVENOUS | Status: DC

## 2016-04-12 MED ORDER — ACETAMINOPHEN 325 MG PO TABS: 650.0000 mg | ORAL_TABLET | ORAL | Status: DC | PRN

## 2016-04-12 MED ORDER — HEPARIN BOLUS VIA INFUSION
2800.0000 [IU] | Freq: Once | INTRAVENOUS | Status: AC
  Administered 2016-04-12: 2800 [IU] via INTRAVENOUS
  Filled 2016-04-12: qty 2800

## 2016-04-12 MED ORDER — MIDAZOLAM HCL 2 MG/2ML IJ SOLN
INTRAMUSCULAR | Status: AC
Start: 2016-04-12 — End: 2016-04-12
  Filled 2016-04-12: qty 2

## 2016-04-12 MED ORDER — SODIUM CHLORIDE 0.9 % WEIGHT BASED INFUSION
1.0000 mL/kg/h | INTRAVENOUS | Status: DC
  Administered 2016-04-12: 1 mL/kg/h via INTRAVENOUS

## 2016-04-12 MED ORDER — HEPARIN (PORCINE) IN NACL 2-0.9 UNIT/ML-% IJ SOLN
INTRAMUSCULAR | Status: AC
  Filled 2016-04-12: qty 500

## 2016-04-12 MED ORDER — ONDANSETRON HCL 4 MG/2ML IJ SOLN: 4.0000 mg | Freq: Four times a day (QID) | INTRAMUSCULAR | Status: DC | PRN

## 2016-04-12 MED ORDER — SODIUM CHLORIDE 0.9% FLUSH
3.0000 mL | Freq: Two times a day (BID) | INTRAVENOUS | Status: DC
  Administered 2016-04-12: 3 mL via INTRAVENOUS

## 2016-04-12 MED ORDER — PANTOPRAZOLE SODIUM 40 MG PO TBEC: 40.0000 mg | DELAYED_RELEASE_TABLET | Freq: Two times a day (BID) | ORAL | 1 refills | Status: DC

## 2016-04-12 MED ORDER — FENTANYL CITRATE (PF) 100 MCG/2ML IJ SOLN
INTRAMUSCULAR | Status: AC
  Filled 2016-04-12: qty 2

## 2016-04-12 MED ORDER — FENTANYL CITRATE (PF) 100 MCG/2ML IJ SOLN
INTRAMUSCULAR | Status: DC | PRN
Start: 2016-04-12 — End: 2016-04-12
  Administered 2016-04-12: 25 ug via INTRAVENOUS

## 2016-04-12 SURGICAL SUPPLY — 9 items
CATH 5FR JR4 DIAGNOSTIC (CATHETERS) ×2 IMPLANT
CATH 5FR PIGTAIL DIAGNOSTIC (CATHETERS) ×3 IMPLANT
CATH INFINITI 5FR JL4 (CATHETERS) ×3 IMPLANT
DEVICE CLOSURE MYNXGRIP 5F (Vascular Products) ×3 IMPLANT
KIT MANI 3VAL PERCEP (MISCELLANEOUS) ×3 IMPLANT
NEEDLE PERC 18GX7CM (NEEDLE) ×3 IMPLANT
PACK CARDIAC CATH (CUSTOM PROCEDURE TRAY) ×3 IMPLANT
SHEATH PINNACLE 5F 10CM (SHEATH) ×3 IMPLANT
WIRE EMERALD 3MM-J .035X150CM (WIRE) ×3 IMPLANT

## 2016-04-12 NOTE — Progress Notes (Signed)
*  PRELIMINARY RESULTS* Echocardiogram 2D Echocardiogram has been performed.  Sherrie Sport 04/12/2016, 10:59 AM

## 2016-04-12 NOTE — Care Management (Signed)
Placed in observation for chest pain. Troponins negative so far. Cardiology has seen and for cardiac cath today due to extensive cardiac history and previous PCI.

## 2016-04-12 NOTE — Care Management Obs Status (Signed)
[facility] NOTIFICATION   Patient Details  Name: Clayton Sawyer MRN: [id number] Date of Birth: 10/06/1940   Medicare Observation Status Notification Given:  Yes    Katrina Stack, RN 04/12/2016, 9:47 AM

## 2016-04-12 NOTE — Progress Notes (Signed)
Clayton Sawyer to be D/C'd home per MD order.  Discussed prescriptions and follow up appointments with the patient. Prescriptions were called to pharmacy pt uses, medication list explained in detail. Patient verbalized understanding.    Medication List    TAKE these medications   aspirin 81 MG tablet Take 1 tablet by mouth daily.   clopidogrel 75 MG tablet Commonly known as:  PLAVIX Take 1 tablet by mouth daily.   LIVALO 4 MG Tabs Generic drug:  Pitavastatin Calcium Take 1 tablet by mouth daily.   metoprolol succinate 25 MG 24 hr tablet Commonly known as:  TOPROL-XL Take 1 tablet by mouth daily.   pantoprazole 40 MG tablet Commonly known as:  PROTONIX Take 1 tablet (40 mg total) by mouth 2 (two) times daily. What changed:  when to take this       Vitals:   04/12/16 1957 04/12/16 2157  BP: 136/65 (!) 133/54  Pulse: 67 70  Resp: 20 20  Temp: 99 F (37.2 C) 98.1 F (36.7 C)     Skin intact, mynx dressing still on right groin site from cardaic cath today, explained to patient that he can take it off tomorrow and put a band-aid on it if he would like. IV catheter discontinued. Site without signs and symptoms of complications. Dressing and pressure applied. Telemetry removed. Patient denies pain at this time. No complaints noted.  An After Visit Summary was printed and given to the patient. Patient signed and I witnessed. Patient escorted via [place], and D/C home via private auto with wife.  [name] 04/12/2016 10:57 PM

## 2016-04-12 NOTE — Progress Notes (Signed)
Report to [facility] telemetry.  Check right groin for bleeding or hematoma.  Patient will be on bedrest for 1 hours post sheath pull---out of bed at 18:50.  Bilateral pulses are 2's DP's.Marland Kitchen

## 2016-04-12 NOTE — Progress Notes (Signed)
[facility] [place] NAME: Clayton Sawyer    MR#:  [id number]  DATE OF BIRTH:  01-08-1941  SUBJECTIVE:   Pt. Here due to chest pain.  Continues to have some mild CP but much improved. Going for Cardiac cath later today. Wife at bedside.   REVIEW OF SYSTEMS:    Review of Systems  Constitutional: Negative for chills and fever.  HENT: Negative for congestion and tinnitus.   Eyes: Negative for blurred vision and double vision.  Respiratory: Negative for cough, shortness of breath and wheezing.   Cardiovascular: Negative for chest pain, orthopnea and PND.  Gastrointestinal: Negative for abdominal pain, diarrhea, nausea and vomiting.  Genitourinary: Negative for dysuria and hematuria.  Neurological: Negative for dizziness, sensory change and focal weakness.  All other systems reviewed and are negative.   Nutrition: Heart healthy Tolerating Diet: Yes Tolerating PT: Ambulatory   DRUG ALLERGIES:   Allergies  Allergen Reactions  . Statins Other (See Comments)    Body aches    VITALS:  Blood pressure 132/72, pulse 67, temperature 99.2 F (37.3 C), temperature source Oral, resp. rate (!) 24, height 5\' 9"  (1.753 m), weight 106.3 kg (234 lb 6.4 oz), SpO2 95 %.  PHYSICAL EXAMINATION:   Physical Exam  GENERAL:  75 y.o.-year-old patient lying in the bed in no acute distress.  EYES: Pupils equal, round, reactive to light and accommodation. No scleral icterus. Extraocular muscles intact.  HEENT: Head atraumatic, normocephalic. Oropharynx and nasopharynx clear.  NECK:  Supple, no jugular venous distention. No thyroid enlargement, no tenderness.  LUNGS: Normal breath sounds bilaterally, no wheezing, rales, rhonchi. No use of accessory muscles of respiration.  CARDIOVASCULAR: S1, S2 normal. No murmurs, rubs, or gallops.  ABDOMEN: Soft, nontender, nondistended. Bowel sounds present. No organomegaly or mass.  EXTREMITIES: No cyanosis, clubbing or  edema b/l.    NEUROLOGIC: Cranial nerves II through XII are intact. No focal Motor or sensory deficits b/l.   PSYCHIATRIC: The patient is alert and oriented x 3.  SKIN: No obvious rash, lesion, or ulcer.    LABORATORY PANEL:   CBC  Recent Labs Lab 04/12/16 0858  WBC 8.5  HGB 13.7  HCT 38.5*  PLT 174   ------------------------------------------------------------------------------------------------------------------  Chemistries   Recent Labs Lab 04/11/16 1047 04/11/16 1737  NA 139 138  K 5.0 4.6  CL 107 105  CO2 25 27  GLUCOSE 124* 122*  BUN 21* 20  CREATININE 1.19 1.05  CALCIUM 9.3 9.3  AST 27  --   ALT 22  --   ALKPHOS 76  --   BILITOT 1.1  --    ------------------------------------------------------------------------------------------------------------------  Cardiac Enzymes  Recent Labs Lab 04/11/16 2303  TROPONINI <0.03   ------------------------------------------------------------------------------------------------------------------  RADIOLOGY:  Dg Chest 2 View  Result Date: 04/11/2016 CLINICAL DATA:  Patient reports central chest pain onset today. Denies SOB. Hx HTN, pacemaker insertion, multiple stent placements. Non-smoker. EXAM: CHEST  2 VIEW COMPARISON:  radiograph 11/06/2013 FINDINGS: RIGHT-sided pacemaker with continuous leads overlies normal cardiac silhouette. LEFT basilar atelectasis or scarring unchanged. No effusion. Degenerative osteophytosis of the thoracic spine. IMPRESSION: No acute cardiopulmonary process. Electronically Signed   By: Suzy Bouchard M.D.   On: 04/11/2016 11:40     ASSESSMENT AND PLAN:   75 year old male with past medical history of coronary artery disease status post stent placement, essential hypertension who presents to the hospital complaining of chest pain.   1. Chest pain-patient does have significant risk factors given  his. His history of CAD and stent placement. -Seen by cardiology and plan for cardiac  catheterization later today. -Continue aspirin, heparin drip, Plavix, metoprolol.  2. Essential hypertension-continue Toprol.  3. GERD-continue Protonix.  Dispo pending Cardiac Catherization.   All the records are reviewed and case discussed with Care Management/Social Worker. Management plans discussed with the patient, family and they are in agreement.  CODE STATUS: full code  DVT Prophylaxis: Heparin gtt  TOTAL TIME TAKING CARE OF THIS PATIENT: 30 minutes.   POSSIBLE D/C IN 1-2 DAYS, DEPENDING ON CLINICAL CONDITION.   Henreitta Leber M.D on 04/12/2016 at 3:31 PM  Between 7am to 6pm - Pager - [phone]  After 6pm go to [email or web address] - Proofreader  [facility]  Office  [phone]  CC: Primary care physician; Maryland Pink, MD

## 2016-04-12 NOTE — Progress Notes (Signed)
SUBJECTIVE: No chest pain   Vitals:   04/12/16 0021 04/12/16 0400 04/12/16 0511 04/12/16 0821  BP: (!) 123/52 (!) 121/49  (!) 109/51  Pulse: 60 60 (!) 107 60  Resp:      Temp:   98.3 F (36.8 C) 99 F (37.2 C)  TempSrc:   Oral Oral  SpO2:   90% 95%  Weight:   234 lb 6.4 oz (106.3 kg)   Height:        Intake/Output Summary (Last 24 hours) at 04/12/16 0838 Last data filed at 04/12/16 0700  Gross per 24 hour  Intake          1406.61 ml  Output                0 ml  Net          1406.61 ml    LABS: Basic Metabolic Panel:  Recent Labs  04/11/16 1047 04/11/16 1737  NA 139 138  K 5.0 4.6  CL 107 105  CO2 25 27  GLUCOSE 124* 122*  BUN 21* 20  CREATININE 1.19 1.05  CALCIUM 9.3 9.3   Liver Function Tests:  Recent Labs  04/11/16 1047  AST 27  ALT 22  ALKPHOS 76  BILITOT 1.1  PROT 7.0  ALBUMIN 4.3   No results for input(s): LIPASE, AMYLASE in the last 72 hours. CBC:  Recent Labs  04/11/16 1047  WBC 10.3  HGB 16.4  HCT 47.1  MCV 89.7  PLT 218   Cardiac Enzymes:  Recent Labs  04/11/16 1737 04/11/16 2024 04/11/16 2303  TROPONINI <0.03 <0.03 <0.03   BNP: Invalid input(s): POCBNP D-Dimer: No results for input(s): DDIMER in the last 72 hours. Hemoglobin A1C: No results for input(s): HGBA1C in the last 72 hours. Fasting Lipid Panel: No results for input(s): CHOL, HDL, LDLCALC, TRIG, CHOLHDL, LDLDIRECT in the last 72 hours. Thyroid Function Tests: No results for input(s): TSH, T4TOTAL, T3FREE, THYROIDAB in the last 72 hours.  Invalid input(s): FREET3 Anemia Panel: No results for input(s): VITAMINB12, FOLATE, FERRITIN, TIBC, IRON, RETICCTPCT in the last 72 hours.   PHYSICAL EXAM General: Well developed, well nourished, in no acute distress HEENT:  Normocephalic and atramatic Neck:  No JVD.  Lungs: Clear bilaterally to auscultation and percussion. Heart: HRRR . Normal S1 and S2 without gallops or murmurs.  Abdomen: Bowel sounds are positive,  abdomen soft and non-tender  Msk:  Back normal, normal gait. Normal strength and tone for age. Extremities: No clubbing, cyanosis or edema.   Neuro: Alert and oriented X 3. Psych:  Good affect, responds appropriately  TELEMETRY:Sinus rhythm  ASSESSMENT AND PLAN: Acute coronary syndrome with ongoing chest pain and history of PCI and stenting plan to do cardiac catheterization today.  Active Problems:   Chest pain, rule out acute myocardial infarction    Eldin Bonsell A, MD, [facility] 04/12/2016 8:38 AM

## 2016-04-12 NOTE — Progress Notes (Signed)
ANTICOAGULATION CONSULT NOTE - Initial Consult  Pharmacy Consult for heparin  Indication: chest pain/ACS  Allergies  Allergen Reactions  . Statins Other (See Comments)    Body aches    Patient Measurements: Height: 5\' 9"  (175.3 cm) Weight: 238 lb (108 kg) IBW/kg (Calculated) : 70.7 Heparin Dosing Weight: 94kg  Vital Signs: Temp: 98.9 F (37.2 C) (09/11 2028) Temp Source: Oral (09/11 2028) BP: 123/49 (09/11 2300) Pulse Rate: 60 (09/11 2300)  Labs:  Recent Labs  04/11/16 1047 04/11/16 1737 04/11/16 2024 04/11/16 2303  HGB 16.4  --   --   --   HCT 47.1  --   --   --   PLT 218  --   --   --   APTT 31  --   --   --   LABPROT 13.3  --   --   --   INR 1.01  --   --   --   HEPARINUNFRC  --   --   --  0.16*  CREATININE 1.19 1.05  --   --   TROPONINI <0.03 <0.03 <0.03 <0.03    Estimated Creatinine Clearance: 73.6 mL/min (by C-G formula based on SCr of 1.05 mg/dL).   Medical History: Past Medical History:  Diagnosis Date  . Hypertension   . MI, old     Assessment: 75 yo male with PMH of HTN, CAD, and MI. Pharmacy consulted for heparin dosing and monitoring for Chest Pain/ACS. Patient reports he does not take any anticoagulants at home.   Goal of Therapy:  Heparin level 0.3-0.7 units/ml Monitor platelets by anticoagulation protocol: Yes   Plan:  Patient DW is 94kg. Baseline aPTT and PT/INR ordered.  Give 4000 units bolus x 1 Start heparin infusion at 1200 units/hr Check anti-Xa level in 8 hours and daily while on heparin Continue to monitor H&H and platelets   04/12/16 2303 HL subtherapeutic. 2800 units IV x 1 bolus and increase rate to 1500 units/hr. Will recheck HL in 8 hours.  Alianys Chacko A. [place], [place].D., BCPS Clinical Pharmacist 04/12/2016 12:14 AM

## 2016-04-12 NOTE — Progress Notes (Signed)
Patient returned to the unit from cath. Lab around 1845, right groin dressing intact, no bleeding, no hematoma noted to site. Patient denies pain at this time, will continue to monitor.

## 2016-04-13 ENCOUNTER — Encounter: Payer: Self-pay | Admitting: Cardiovascular Disease

## 2016-04-13 MED ORDER — IOPAMIDOL (ISOVUE-300) INJECTION 61%
INTRAVENOUS | Status: DC | PRN
  Administered 2016-04-12: 130 mL via INTRA_ARTERIAL

## 2016-04-13 NOTE — Discharge Summary (Signed)
[facility] [facility] [place] NAME: Clayton Sawyer    MR#:  [id number]  DATE OF BIRTH:  Nov 29, 1940  DATE OF ADMISSION:  04/11/2016 ADMITTING PHYSICIAN: Harvie Bridge, DO  DATE OF DISCHARGE: 04/12/2016 10:23 PM  PRIMARY CARE PHYSICIAN: Maryland Pink, MD    ADMISSION DIAGNOSIS:  Unstable angina ([place]) [I20.0]  DISCHARGE DIAGNOSIS:  Active Problems:   Chest pain, rule out acute myocardial infarction   SECONDARY DIAGNOSIS:   Past Medical History:  Diagnosis Date  . Hypertension   . MI, old     HOSPITAL COURSE:   75 year old male with past medical history of hypertension, coronary artery disease status post stent to presented to the hospital due to chest pain.  1. Chest pain-given patient's significant cardiac history was admitted to the hospital and had 3 sets of cardiac markers checked which were negative. He still continued to complain of atypical chest pain and therefore was seen by cardiology and underwent a cardiac catheterization which showed no significant obstructive coronary disease. His 2 previous stents are patent without any evidence of thrombus. -He the cause of this chest pain was thought to be possibly related to GERD and he was discharged on PPI twice a day. He will follow-up with his cardiologist as an outpatient. -He will also continue his aspirin and Plavix  2. Essential hypertension-patient will resume his metoprolol.  DISCHARGE CONDITIONS:   Stable  CONSULTS OBTAINED:  Treatment Team:  Dionisio David, MD  DRUG ALLERGIES:   Allergies  Allergen Reactions  . Statins Other (See Comments)    Body aches    DISCHARGE MEDICATIONS:     Medication List    TAKE these medications   aspirin 81 MG tablet Take 1 tablet by mouth daily.   clopidogrel 75 MG tablet Commonly known as:  PLAVIX Take 1 tablet by mouth daily.   LIVALO 4 MG Tabs Generic drug:  Pitavastatin Calcium Take 1 tablet by mouth daily.    metoprolol succinate 25 MG 24 hr tablet Commonly known as:  TOPROL-XL Take 1 tablet by mouth daily.   pantoprazole 40 MG tablet Commonly known as:  PROTONIX Take 1 tablet (40 mg total) by mouth 2 (two) times daily. What changed:  when to take this         DISCHARGE INSTRUCTIONS:   DIET:  Cardiac diet  DISCHARGE CONDITION:  Stable  ACTIVITY:  Activity as tolerated  OXYGEN:  Home Oxygen: No.   Oxygen Delivery: room air  DISCHARGE LOCATION:  home   If you experience worsening of your admission symptoms, develop shortness of breath, life threatening emergency, suicidal or homicidal thoughts you must seek medical attention immediately by calling 911 or calling your MD immediately  if symptoms less severe.  You Must read complete instructions/literature along with all the possible adverse reactions/side effects for all the Medicines you take and that have been prescribed to you. Take any new Medicines after you have completely understood and accpet all the possible adverse reactions/side effects.   Please note  You were cared for by a hospitalist during your hospital stay. If you have any questions about your discharge medications or the care you received while you were in the hospital after you are discharged, you can call the unit and asked to speak with the hospitalist on call if the hospitalist that took care of you is not available. Once you are discharged, your primary care physician will handle any further medical issues. Please note that NO  REFILLS for any discharge medications will be authorized once you are discharged, as it is imperative that you return to your primary care physician (or establish a relationship with a primary care physician if you do not have one) for your aftercare needs so that they can reassess your need for medications and monitor your lab values.   DATA REVIEW:   CBC  Recent Labs Lab 04/12/16 0858  WBC 8.5  HGB 13.7  HCT 38.5*  PLT 174     Chemistries   Recent Labs Lab 04/11/16 1047 04/11/16 1737  NA 139 138  K 5.0 4.6  CL 107 105  CO2 25 27  GLUCOSE 124* 122*  BUN 21* 20  CREATININE 1.19 1.05  CALCIUM 9.3 9.3  AST 27  --   ALT 22  --   ALKPHOS 76  --   BILITOT 1.1  --     Cardiac Enzymes  Recent Labs Lab 04/11/16 2303  TROPONINI <0.03    RADIOLOGY:  No results found.    Management plans discussed with the patient, family and they are in agreement.  CODE STATUS:  Code Status History    Date Active Date Inactive Code Status Order ID Comments User Context   04/12/2016  8:15 AM 04/13/2016  1:29 AM Full Code [id number]  Alonna Buckler, RN Inpatient   04/11/2016  6:06 PM 04/12/2016  7:16 AM Full Code [id number]  Alonna Buckler, RN Inpatient   04/11/2016  4:16 PM 04/11/2016  5:26 PM Full Code [id number]  Harvie Bridge, DO Inpatient      TOTAL TIME TAKING CARE OF THIS PATIENT: 40 minutes.    Henreitta Leber M.D on 04/13/2016 at 2:14 PM  Between 7am to 6pm - Pager - [phone]  After 6pm go to [email or web address] - Proofreader  [facility]  Office  [phone]  CC: Primary care physician; Maryland Pink, MD

## 2017-07-14 ENCOUNTER — Other Ambulatory Visit: Payer: BC Managed Care – PPO

## 2017-07-14 ENCOUNTER — Other Ambulatory Visit: Payer: Self-pay

## 2017-07-14 DIAGNOSIS — C61 Malignant neoplasm of prostate: Secondary | ICD-10-CM

## 2017-07-15 LAB — PSA: PROSTATE SPECIFIC AG, SERUM: 1.9 ng/mL (ref 0.0–4.0)

## 2017-07-17 ENCOUNTER — Ambulatory Visit: Payer: Medicare Other | Admitting: Urology

## 2017-07-17 ENCOUNTER — Encounter: Payer: Self-pay | Admitting: Urology

## 2017-07-17 ENCOUNTER — Telehealth: Payer: Self-pay

## 2017-07-17 VITALS — BP 142/69 | HR 59 | Ht 69.0 in | Wt 233.0 lb

## 2017-07-17 DIAGNOSIS — C61 Malignant neoplasm of prostate: Secondary | ICD-10-CM | POA: Diagnosis not present

## 2017-07-17 NOTE — Telephone Encounter (Signed)
-----   Message from Abbie Sons, MD sent at 07/16/2017  9:23 AM EST ----- PSA was 1.9.  F/U 6 months

## 2017-07-17 NOTE — Telephone Encounter (Signed)
Letter sent.

## 2017-07-17 NOTE — Progress Notes (Signed)
07/17/2017 9:43 AM   Clayton Sawyer 1941-06-07 [id number]  Referring provider: Maryland Pink, MD [address] [facility] [place], [place] [address]  Chief Complaint  Patient presents with  . Prostate Cancer    7month    HPI: 76 year old male presents for follow-up of prostate cancer.  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  PSA nadir was 0.5 present over the last few years:  11/2013    0.5 05/2014  0.7 06/2015  0.95 12/2015    0.96 05/2016  1.14 09/2016    1.25 11/2016    1.53 07/2017  1.9  He has no bothersome lower urinary tract symptoms.  Denies dysuria or gross hematuria.  Denies flank, abdominal, pelvic or scrotal pain.  Prostate 15 g, no nodules or induration   PMH: Past Medical History:  Diagnosis Date  . Abdominal hernia without obstruction or gangrene 05/22/2014  . 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 March 2006 reports two drug-eluting stents placed in the right coronary artery (reported this procedure not currently available).  2. December 2006 cardiac catheterization:  EF 78%, 50% stenosis in the ramus intermedius and the right posterior diagonal artery.  Patient treated medically.  3. December 2006 cardiac MRI negative for ischemia.  EF normal.  4. April 2008 cardiac catheteri  . Calculus of kidney 09/26/2011   Overview:  1. Has bilateral retained kidney stones.  2. Status post urethral stenting.  Overview:  Overview:  1. Has bilateral retained kidney stones.  2. Status post urethral stenting.   . Chronotropic incompetence 07/22/2011   Overview:  Status post permanent pacemaker placement 06/12/2009 by Dr. Norm Salt.  Overview:  Overview:  Status post permanent pacemaker placement 06/12/2009 by Dr. Norm Salt.   . Diastolic dysfunction 40/34/7425   Overview:  1. June 2009 had progressive orthopnea and dyspnea on  exertion and lower extremity edema.  2. July 2009 2D echocardiogram: EF >55%, mild concentric LVH, diastolic dysfunction Grade II, LAE 4.6 cm, mild tricuspid regurgitation.  Overview:  Overview:  1. June 2009 had progressive orthopnea and dyspnea on exertion and lower extremity edema.  2. July 2009 2D echocardiogram: EF >55%, mild   . Essential (primary) hypertension 07/22/2011  . 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   . Hyperlipidemia 07/22/2011   Overview:  1. December 2006 TC 142, TG 92, HDL 32, LDL 93.  2. November 2008 TC 181, TG 97, HDL 29, LDL 33.  3. January 2010 LDL 137, HDL 29.  4. Referral to Dr. Harrell Gave clinic. Patient started on Niacin, which he did not tolerate. Additionally started on Crestor alternating 2.5 mg every other day, which he also was unable to tolerate. Overview:  Overview:  1. December 2006 TC 142, TG 92, HDL 3  . Hypertension   . Malignant neoplasm of prostate ([place]) 03/04/2013  . MI, old   . Migraine without status migrainosus, not intractable 05/22/2014  . Osteoarthritis 09/26/2011   Overview:  Right total knee replacement.  Overview:  Overview:  Right total knee replacement.   . Presence of cardiac pacemaker 07/21/2011   Overview:  Dual-chamber pacemaker is a Biotronik via DR reference number [id number] serial number [id number].  Right atrial lead is a Comptroller  number [id number] serial number [id number].  Right ventricular lead is Biotronik Setrox [id number] reference number is [id number] serial number [id number]. Device implanted 06/11/2009 Overview:  Overview:  Dual-chamber pacemaker is a Biotronik via DR reference n  . Varicella without complication 69/67/8938    Surgical History: Past Surgical History:  Procedure Laterality Date  . CARDIAC CATHETERIZATION Right  04/12/2016   Procedure: Left Heart Cath and Coronary Angiography;  Surgeon: Dionisio David, MD;  Location: [facility];  Service: Cardiovascular;  Laterality: Right;  . knee replacements    . stents     cardiac stents    Home Medications:  Allergies as of 07/17/2017      Reactions   Statins Other (See Comments)   Body aches      Medication List        Accurate as of 07/17/17  9:43 AM. Always use your most recent med list.          aspirin 81 MG tablet Take 1 tablet by mouth daily.   clopidogrel 75 MG tablet Commonly known as:  PLAVIX Take 1 tablet by mouth daily.   metoprolol succinate 25 MG 24 hr tablet Commonly known as:  TOPROL-XL Take 1 tablet by mouth daily.   pantoprazole 40 MG tablet Commonly known as:  PROTONIX Take 1 tablet (40 mg total) by mouth 2 (two) times daily.       Allergies:  Allergies  Allergen Reactions  . Statins Other (See Comments)    Body aches    Family History: Family History  Problem Relation Age of Onset  . Prostate cancer Neg Hx   . Bladder Cancer Neg Hx   . Kidney cancer Neg Hx     Social History:  reports that  has never smoked. he has never used smokeless tobacco. He reports that he does not drink alcohol or use drugs.  ROS: UROLOGY Frequent Urination?: No Hard to postpone urination?: No Burning/pain with urination?: No Get up at night to urinate?: No Leakage of urine?: No Urine stream starts and stops?: No Trouble starting stream?: No Do you have to strain to urinate?: No Blood in urine?: No Urinary tract infection?: No Sexually transmitted disease?: No Injury to kidneys or bladder?: No Painful intercourse?: No Weak stream?: No Erection problems?: No Penile pain?: No  Gastrointestinal Nausea?: No Vomiting?: No Indigestion/heartburn?: No Diarrhea?: No Constipation?: No  Constitutional Fever: No Night sweats?: No Weight loss?: No Fatigue?: No  Skin Skin rash/lesions?: No Itching?:  No  Eyes Blurred vision?: No Double vision?: No  Ears/Nose/Throat Sore throat?: No Sinus problems?: No  Hematologic/Lymphatic Swollen glands?: No Easy bruising?: No  Cardiovascular Leg swelling?: No Chest pain?: No  Respiratory Cough?: No Shortness of breath?: No  Endocrine Excessive thirst?: No  Musculoskeletal Back pain?: No Joint pain?: No  Neurological Headaches?: No Dizziness?: No  Psychologic Depression?: No Anxiety?: No  Physical Exam: BP (!) 142/69   Pulse (!) 59   Ht 5\' 9"  (1.753 m)   Wt 233 lb (105.7 kg)   BMI 34.41 kg/m   Constitutional:  Alert and oriented, No acute distress. HEENT: [place] AT, moist mucus membranes.  Trachea midline, no masses. Cardiovascular: No clubbing, cyanosis, or edema. Respiratory: Normal respiratory effort, no increased work of breathing. GI: Abdomen is soft, nontender, nondistended, no abdominal masses GU: No CVA tenderness.  Prostate 15 g, no nodules or induration Skin: No rashes, bruises or suspicious lesions. Lymph: No cervical or inguinal adenopathy. Neurologic: Grossly intact, no focal deficits, moving  all 4 extremities. Psychiatric: Normal mood and affect.  Laboratory Data: Lab Results  Component Value Date   WBC 8.5 04/12/2016   HGB 13.7 04/12/2016   HCT 38.5 (L) 04/12/2016   MCV 89.4 04/12/2016   PLT 174 04/12/2016    Lab Results  Component Value Date   CREATININE 1.05 04/11/2016    Lab Results  Component Value Date   PSA1 1.9 07/14/2017     Assessment & Plan:    1. Prostate cancer [facility]) I again discussed his slowly rising PSA and the likelihood of recurrent prostate cancer.  We discussed salvage therapies of radical prostatectomy and cryoablation.  He is not interested in salvage therapy.  He is asymptomatic I would not recommend starting hormonal therapy at this time.  He desires to continue surveillance and recommend a follow-up appointment in 6 months.   Return in about 6 months (around  01/15/2018) for Recheck.  Abbie Sons, [place] [address], [place], [place] [address] [phone]

## 2017-12-06 ENCOUNTER — Other Ambulatory Visit: Payer: Self-pay

## 2017-12-06 ENCOUNTER — Emergency Department
Admission: EM | Admit: 2017-12-06 | Discharge: 2017-12-07 | Disposition: A | Discharge status: Home / Self Care | Payer: Medicare Other | Attending: Emergency Medicine | Admitting: Emergency Medicine

## 2017-12-06 DIAGNOSIS — Z7902 Long term (current) use of antithrombotics/antiplatelets: Secondary | ICD-10-CM | POA: Insufficient documentation

## 2017-12-06 DIAGNOSIS — Z950 Presence of cardiac pacemaker: Secondary | ICD-10-CM | POA: Insufficient documentation

## 2017-12-06 DIAGNOSIS — I119 Hypertensive heart disease without heart failure: Secondary | ICD-10-CM | POA: Insufficient documentation

## 2017-12-06 DIAGNOSIS — Z7982 Long term (current) use of aspirin: Secondary | ICD-10-CM | POA: Diagnosis not present

## 2017-12-06 DIAGNOSIS — R42 Dizziness and giddiness: Secondary | ICD-10-CM | POA: Insufficient documentation

## 2017-12-06 DIAGNOSIS — Z79899 Other long term (current) drug therapy: Secondary | ICD-10-CM | POA: Diagnosis not present

## 2017-12-06 DIAGNOSIS — I2510 Atherosclerotic heart disease of native coronary artery without angina pectoris: Secondary | ICD-10-CM | POA: Insufficient documentation

## 2017-12-06 LAB — COMPREHENSIVE METABOLIC PANEL
ALBUMIN: 4.3 g/dL (ref 3.5–5.0)
ALK PHOS: 84 U/L (ref 38–126)
ALT: 17 U/L (ref 17–63)
ANION GAP: 6 (ref 5–15)
AST: 22 U/L (ref 15–41)
BUN: 15 mg/dL (ref 6–20)
CALCIUM: 9 mg/dL (ref 8.9–10.3)
CO2: 25 mmol/L (ref 22–32)
Chloride: 107 mmol/L (ref 101–111)
Creatinine, Ser: 0.92 mg/dL (ref 0.61–1.24)
GFR calc Af Amer: >60 mL/min (ref >60)
GFR calc non Af Amer: >60 mL/min (ref >60)
GLUCOSE: 121 mg/dL — AB (ref 65–99)
Potassium: 3.7 mmol/L (ref 3.5–5.1)
SODIUM: 138 mmol/L (ref 135–145)
Total Bilirubin: 1.2 mg/dL (ref 0.3–1.2)
Total Protein: 7.3 g/dL (ref 6.5–8.1)

## 2017-12-06 LAB — CBC WITH DIFFERENTIAL/PLATELET
BASOS ABS: 0.1 10*3/uL (ref 0–0.1)
BASOS PCT: 1 %
Eosinophils Absolute: 0.2 10*3/uL (ref 0–0.7)
Eosinophils Relative: 2 %
HCT: 47.4 % (ref 40.0–52.0)
Hemoglobin: 16 g/dL (ref 13.0–18.0)
LYMPHS PCT: 27 %
Lymphs Abs: 2.4 10*3/uL (ref 1.0–3.6)
MCH: 30.7 pg (ref 26.0–34.0)
MCHC: 33.9 g/dL (ref 32.0–36.0)
MCV: 90.7 fL (ref 80.0–100.0)
MONO ABS: 0.7 10*3/uL (ref 0.2–1.0)
Monocytes Relative: 8 %
NEUTROS ABS: 5.5 10*3/uL (ref 1.4–6.5)
Neutrophils Relative %: 62 %
Platelets: 257 10*3/uL (ref 150–440)
RBC: 5.22 MIL/uL (ref 4.40–5.90)
RDW: 14.6 % — ABNORMAL HIGH (ref 11.5–14.5)
WBC: 9 10*3/uL (ref 3.8–10.6)

## 2017-12-06 LAB — TROPONIN I: Troponin I: <0.03 ng/mL (ref <0.03)

## 2017-12-06 MED ORDER — ONDANSETRON 4 MG PO TBDP
4.0000 mg | ORAL_TABLET | Freq: Once | ORAL | Status: AC | PRN
  Administered 2017-12-06: 4 mg via ORAL
  Filled 2017-12-06: qty 1

## 2017-12-06 NOTE — ED Notes (Signed)
Informed RN that patient has been roomed and is ready for evaluation.  Patient in NAD at this time and call bell placed within reach.   

## 2017-12-06 NOTE — ED Notes (Signed)
Pt is on blood thinners - blue top was drawn if needed

## 2017-12-06 NOTE — ED Provider Notes (Signed)
[facility] Emergency Department Provider Note  Time seen: 11:24 PM  I have reviewed the triage vital signs and the nursing notes.   HISTORY  Chief Complaint Dizziness    HPI Clayton Sawyer is a 77 y.o. male with a past medical history of hypertension, hyperlipidemia, prior MI, pacemaker, presents to the emergency department for dizziness and feeling off balance.  According to the patient he ate dinner around 6 PM.  He states shortly after dinner he stood up to leave and felt off balance and somewhat sweaty.  Patient states he went to the church, continued to feel off balance and sweaty states he had to sit down because he thought he was going to fall.  He states his wife wanted him to come to the emergency department and so he came for evaluation.  Denies any chest pain now or at any point.  Denies any shortness of breath.  Patient states he was not nauseated at all when this occurred however in the waiting room he became nauseated and vomited x1.  Denies any fever, cough or congestion.  Patient does state approximately 2 weeks ago he was diagnosed with a mild urinary tract infection by his primary care doctor and was discharged with a short course of antibiotics.  Patient denies any weakness or numbness confusion or slurred speech.  Denies any headache.  Overall the patient appears very well currently.  Denies any symptoms at this time.   Past Medical History:  Diagnosis Date  . Abdominal hernia without obstruction or gangrene 05/22/2014  . 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 March 2006 reports two drug-eluting stents placed in the right coronary artery (reported this procedure not currently available).  2. December 2006 cardiac catheterization:  EF 78%, 50% stenosis in the ramus intermedius and the right posterior diagonal artery.  Patient treated medically.  3. December 2006 cardiac MRI negative  for ischemia.  EF normal.  4. April 2008 cardiac catheteri  . Calculus of kidney 09/26/2011   Overview:  1. Has bilateral retained kidney stones.  2. Status post urethral stenting.  Overview:  Overview:  1. Has bilateral retained kidney stones.  2. Status post urethral stenting.   . Chronotropic incompetence 07/22/2011   Overview:  Status post permanent pacemaker placement 06/12/2009 by Dr. Norm Salt.  Overview:  Overview:  Status post permanent pacemaker placement 06/12/2009 by Dr. Norm Salt.   . Diastolic dysfunction 81/19/1478   Overview:  1. June 2009 had progressive orthopnea and dyspnea on exertion and lower extremity edema.  2. July 2009 2D echocardiogram: EF >55%, mild concentric LVH, diastolic dysfunction Grade II, LAE 4.6 cm, mild tricuspid regurgitation.  Overview:  Overview:  1. June 2009 had progressive orthopnea and dyspnea on exertion and lower extremity edema.  2. July 2009 2D echocardiogram: EF >55%, mild   . Essential (primary) hypertension 07/22/2011  . 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   . Hyperlipidemia 07/22/2011   Overview:  1. December 2006 TC 142, TG 92, HDL 32, LDL 93.  2. November 2008 TC 181, TG 97, HDL 29, LDL 33.  3. January 2010 LDL 137, HDL 29.  4. Referral to Dr. Harrell Gave clinic. Patient started on Niacin, which he did not tolerate. Additionally started on Crestor alternating 2.5 mg every  other day, which he also was unable to tolerate. Overview:  Overview:  1. December 2006 TC 142, TG 92, HDL 3  . Hypertension   . Malignant neoplasm of prostate ([place]) 03/04/2013  . MI, old   . Migraine without status migrainosus, not intractable 05/22/2014  . Osteoarthritis 09/26/2011   Overview:  Right total knee replacement.  Overview:  Overview:  Right total knee  replacement.   . Presence of cardiac pacemaker 07/21/2011   Overview:  Dual-chamber pacemaker is a Biotronik via DR reference number [id number] serial number [id number].  Right atrial lead is a Comptroller number [id number] serial number [id number].  Right ventricular lead is Biotronik Setrox [id number] reference number is [id number] serial number [id number]. Device implanted 06/11/2009 Overview:  Overview:  Dual-chamber pacemaker is a Biotronik via DR reference n  . Varicella without complication 85/46/2703    Patient Active Problem List   Diagnosis Date Noted  . Chest pain, rule out acute myocardial infarction 04/11/2016  . Varicella without complication 50/04/3817  . Migraine without status migrainosus, not intractable 05/22/2014  . History of urinary stone 05/22/2014  . Abdominal hernia without obstruction or gangrene 05/22/2014  . Malignant neoplasm of prostate ([place]) 03/04/2013  . Palpitations 09/26/2011  . Osteoarthritis 09/26/2011  . History of stroke 09/26/2011  . Childhood asthma 09/26/2011  . Calculus of kidney 09/26/2011  . Hyperlipidemia 07/22/2011  . Essential (primary) hypertension 07/22/2011  . Diastolic dysfunction 29/93/7169  . Chronotropic incompetence 07/22/2011  . Atherosclerotic heart disease of native coronary artery without angina pectoris 07/22/2011  . Presence of cardiac pacemaker 07/21/2011    Past Surgical History:  Procedure Laterality Date  . CARDIAC CATHETERIZATION Right 04/12/2016   Procedure: Left Heart Cath and Coronary Angiography;  Surgeon: Dionisio David, MD;  Location: [facility];  Service: Cardiovascular;  Laterality: Right;  . knee replacements    . stents     cardiac stents    Prior to Admission medications   Medication Sig Start Date End Date Taking? Authorizing Provider  aspirin 81 MG tablet Take 1 tablet by mouth daily. 04/13/10   [provider]  clopidogrel (PLAVIX) 75 MG tablet Take 1 tablet by mouth daily.    [provider]  metoprolol succinate (TOPROL-XL) 25 MG 24 hr tablet Take 1 tablet by mouth daily. 02/07/13   [provider]  pantoprazole (PROTONIX) 40 MG tablet Take 1 tablet (40 mg total) by mouth 2 (two) times daily. 04/12/16   Henreitta Leber, MD    Allergies  Allergen Reactions  . Statins Other (See Comments)    Body aches    Family History  Problem Relation Age of Onset  . Prostate cancer Neg Hx   . Bladder Cancer Neg Hx   . Kidney cancer Neg Hx     Social History Social History   Tobacco Use  . Smoking status: Never Smoker  . Smokeless tobacco: Never Used  Substance Use Topics  . Alcohol use: No  . Drug use: No    Review of Systems Constitutional: Negative for fever. Eyes: Negative for visual complaints ENT: Negative for recent illness/congestion Cardiovascular: Negative for chest pain. Respiratory: Negative for shortness of breath. Gastrointestinal: Negative for abdominal pain.  Nausea with one episode of vomiting in the waiting room.  Negative for diarrhea. Genitourinary: Diagnosed with UTI 2 weeks ago, denies any dysuria or hematuria. Musculoskeletal: Negative for musculoskeletal complaints Skin: Negative for skin complaints  Neurological: Negative for headache.  Denies weakness or  numbness confusion or slurred speech. All other ROS negative  ____________________________________________   PHYSICAL EXAM:  VITAL SIGNS: ED Triage Vitals  Enc Vitals Group     BP 12/06/17 2004 123/73     Pulse Rate 12/06/17 2004 68     Resp 12/06/17 2004 20     Temp 12/06/17 2004 97.9 F (36.6 C)     Temp Source 12/06/17 2004 Oral     SpO2 12/06/17 2004 96 %     Weight 12/06/17 2004 237 lb (107.5 kg)     Height 12/06/17 2004 5\' 9"  (1.753 m)     Head Circumference --      Peak Flow --      Pain Score 12/06/17 2008 0     Pain Loc --      Pain Edu? --      Excl. in [place]? --     Constitutional: Alert and oriented. Well appearing and in no distress. Eyes:  Normal exam ENT   Head: Normocephalic and atraumatic.   Mouth/Throat: Mucous membranes are moist. Cardiovascular: Normal rate, regular rhythm. No murmur Respiratory: Normal respiratory effort without tachypnea nor retractions. Breath sounds are clear  Gastrointestinal: Soft and nontender. No distention.   Musculoskeletal: Nontender with normal range of motion in all extremities. No lower extremity tenderness or edema. Neurologic:  Normal speech and language. No gross focal neurologic deficits.  Equal grip strengths.  No pronator drift.  No lower extremity drift.  5/5 motor in all extremities.  Cranial nerves intact.  Finger-to-nose testing intact. Skin:  Skin is warm, dry and intact.  Psychiatric: Mood and affect are normal. Speech and behavior are normal.   ____________________________________________    EKG  EKG reviewed and interpreted by myself shows an atrial paced rhythm at 68 bpm with a narrow QRS, normal axis, normal intervals no concerning ST changes. ____________________________________________   INITIAL IMPRESSION / ASSESSMENT AND PLAN / ED COURSE  Pertinent labs & imaging results that were available during my care of the patient were reviewed by me and considered in my medical decision making (see chart for details).  Patient presents emergency department after an episode of dizziness/feeling off balance as well as reported diaphoresis.  Patient denies any chest pain or shortness of breath at any point.  Patient has a normal neurological exam no clinical concern for CVA.  Patient does have a pacemaker denies any palpitations or chest discomfort.  Patient's initial work-up is largely reassuring, troponin is negative, lab work is normal.  We will check a urinalysis to help rule out infectious etiology.  We will attempt to interrogate the patient's pacemaker to rule out an arrhythmia that may have caused his symptoms.  Given his normal neurological exam, do not currently  suspect CVA.  Currently the patient appears extremely well, no distress, lying in bed comfortably denies feeling dizzy at this time.  Patient's urinalysis is normal.  Repeat troponin is negative.  Unfortunately we are unable to interrogate the patient's pacemaker.  Patient states he feels normal.  We will discharge patient home I discussed with the patient follow-up with his primary doctor as well as cardiologist.  Patient agreeable to plan of care.  I also discussed return precautions if any similar symptoms recur the patient is to return to the emergency department.  ____________________________________________   FINAL CLINICAL IMPRESSION(S) / ED DIAGNOSES  Dizziness    Harvest Dark, MD 12/07/17 0206

## 2017-12-06 NOTE — ED Triage Notes (Signed)
Pt reports that he is dizzy - this started at 7pm - pt is sweating profusely - denies chest pain or shortness of breath - reports nausea but denies vomiting

## 2017-12-07 LAB — URINALYSIS, COMPLETE (UACMP) WITH MICROSCOPIC
BACTERIA UA: NONE SEEN
Bilirubin Urine: NEGATIVE
GLUCOSE, UA: NEGATIVE mg/dL
Hgb urine dipstick: NEGATIVE
Ketones, ur: NEGATIVE mg/dL
Leukocytes, UA: NEGATIVE
Nitrite: NEGATIVE
PROTEIN: NEGATIVE mg/dL
Specific Gravity, Urine: 1.021 (ref 1.005–1.030)
pH: 5 (ref 5.0–8.0)

## 2017-12-07 LAB — TROPONIN I: Troponin I: <0.03 ng/mL (ref <0.03)

## 2017-12-15 ENCOUNTER — Other Ambulatory Visit: Payer: Self-pay | Admitting: Cardiovascular Disease

## 2017-12-19 ENCOUNTER — Ambulatory Visit
Admission: RE | Admit: 2017-12-19 | Discharge: 2017-12-19 | Disposition: A | Discharge status: Home / Self Care | Payer: Medicare Other | Source: Ambulatory Visit | Attending: Cardiovascular Disease | Admitting: Cardiovascular Disease

## 2017-12-19 ENCOUNTER — Encounter
Admission: RE | Disposition: A | Discharge status: Home / Self Care | Payer: Self-pay | Source: Ambulatory Visit | Attending: Cardiovascular Disease

## 2017-12-19 ENCOUNTER — Encounter: Payer: Self-pay | Admitting: Cardiovascular Disease

## 2017-12-19 DIAGNOSIS — I252 Old myocardial infarction: Secondary | ICD-10-CM | POA: Diagnosis not present

## 2017-12-19 DIAGNOSIS — E785 Hyperlipidemia, unspecified: Secondary | ICD-10-CM | POA: Insufficient documentation

## 2017-12-19 DIAGNOSIS — I120 Hypertensive chronic kidney disease with stage 5 chronic kidney disease or end stage renal disease: Secondary | ICD-10-CM | POA: Diagnosis present

## 2017-12-19 DIAGNOSIS — I25110 Atherosclerotic heart disease of native coronary artery with unstable angina pectoris: Secondary | ICD-10-CM | POA: Insufficient documentation

## 2017-12-19 DIAGNOSIS — Z955 Presence of coronary angioplasty implant and graft: Secondary | ICD-10-CM | POA: Diagnosis not present

## 2017-12-19 DIAGNOSIS — Z7982 Long term (current) use of aspirin: Secondary | ICD-10-CM | POA: Diagnosis not present

## 2017-12-19 DIAGNOSIS — Z950 Presence of cardiac pacemaker: Secondary | ICD-10-CM | POA: Diagnosis not present

## 2017-12-19 DIAGNOSIS — Z7902 Long term (current) use of antithrombotics/antiplatelets: Secondary | ICD-10-CM | POA: Diagnosis not present

## 2017-12-19 DIAGNOSIS — I69398 Other sequelae of cerebral infarction: Secondary | ICD-10-CM | POA: Insufficient documentation

## 2017-12-19 DIAGNOSIS — I10 Essential (primary) hypertension: Secondary | ICD-10-CM | POA: Insufficient documentation

## 2017-12-19 DIAGNOSIS — H547 Unspecified visual loss: Secondary | ICD-10-CM | POA: Insufficient documentation

## 2017-12-19 DIAGNOSIS — G43909 Migraine, unspecified, not intractable, without status migrainosus: Secondary | ICD-10-CM | POA: Insufficient documentation

## 2017-12-19 DIAGNOSIS — M1990 Unspecified osteoarthritis, unspecified site: Secondary | ICD-10-CM | POA: Diagnosis not present

## 2017-12-19 DIAGNOSIS — I200 Unstable angina: Secondary | ICD-10-CM

## 2017-12-19 HISTORY — PX: LEFT HEART CATH AND CORONARY ANGIOGRAPHY: CATH118249

## 2017-12-19 SURGERY — LEFT HEART CATH AND CORONARY ANGIOGRAPHY
Anesthesia: Moderate Sedation | Laterality: Left

## 2017-12-19 SURGERY — LEFT HEART CATH AND CORONARY ANGIOGRAPHY
Anesthesia: Moderate Sedation | Laterality: Right

## 2017-12-19 MED ORDER — FENTANYL CITRATE (PF) 100 MCG/2ML IJ SOLN
INTRAMUSCULAR | Status: DC | PRN
  Administered 2017-12-19: 50 ug via INTRAVENOUS

## 2017-12-19 MED ORDER — SODIUM CHLORIDE 0.9 % IV SOLN: 250.0000 mL | INTRAVENOUS | Status: DC | PRN

## 2017-12-19 MED ORDER — SODIUM CHLORIDE 0.9% FLUSH: 3.0000 mL | INTRAVENOUS | Status: DC | PRN

## 2017-12-19 MED ORDER — HEPARIN (PORCINE) IN NACL 1000-0.9 UT/500ML-% IV SOLN
INTRAVENOUS | Status: AC
  Filled 2017-12-19: qty 1000

## 2017-12-19 MED ORDER — MIDAZOLAM HCL 2 MG/2ML IJ SOLN
INTRAMUSCULAR | Status: AC
  Filled 2017-12-19: qty 2

## 2017-12-19 MED ORDER — MIDAZOLAM HCL 2 MG/2ML IJ SOLN
INTRAMUSCULAR | Status: DC | PRN
  Administered 2017-12-19: 1 mg via INTRAVENOUS

## 2017-12-19 MED ORDER — SODIUM CHLORIDE 0.9% FLUSH: 3.0000 mL | Freq: Two times a day (BID) | INTRAVENOUS | Status: DC

## 2017-12-19 MED ORDER — SODIUM CHLORIDE 0.9 % WEIGHT BASED INFUSION: 1.0000 mL/kg/h | INTRAVENOUS | Status: DC

## 2017-12-19 MED ORDER — FENTANYL CITRATE (PF) 100 MCG/2ML IJ SOLN
INTRAMUSCULAR | Status: AC
  Filled 2017-12-19: qty 2

## 2017-12-19 MED ORDER — IOPAMIDOL (ISOVUE-300) INJECTION 61%
INTRAVENOUS | Status: DC | PRN
  Administered 2017-12-19: 100 mL via INTRA_ARTERIAL

## 2017-12-19 MED ORDER — LIDOCAINE HCL (PF) 1 % IJ SOLN
INTRAMUSCULAR | Status: AC
  Filled 2017-12-19: qty 30

## 2017-12-19 MED ORDER — ACETAMINOPHEN 325 MG PO TABS: 650.0000 mg | ORAL_TABLET | ORAL | Status: DC | PRN

## 2017-12-19 MED ORDER — ASPIRIN 81 MG PO CHEW: 81.0000 mg | CHEWABLE_TABLET | ORAL | Status: DC

## 2017-12-19 MED ORDER — SODIUM CHLORIDE 0.9 % WEIGHT BASED INFUSION
3.0000 mL/kg/h | INTRAVENOUS | Status: DC
  Administered 2017-12-19: 3 mL/kg/h via INTRAVENOUS

## 2017-12-19 MED ORDER — ONDANSETRON HCL 4 MG/2ML IJ SOLN: 4.0000 mg | Freq: Four times a day (QID) | INTRAMUSCULAR | Status: DC | PRN

## 2017-12-19 SURGICAL SUPPLY — 9 items
CATH INFINITI 5FR ANG PIGTAIL (CATHETERS) ×3 IMPLANT
CATH INFINITI 5FR JL4 (CATHETERS) ×3 IMPLANT
CATH INFINITI JR4 5F (CATHETERS) ×3 IMPLANT
DEVICE CLOSURE MYNXGRIP 5F (Vascular Products) ×3 IMPLANT
KIT MANI 3VAL PERCEP (MISCELLANEOUS) ×3 IMPLANT
NEEDLE PERC 18GX7CM (NEEDLE) ×3 IMPLANT
PACK CARDIAC CATH (CUSTOM PROCEDURE TRAY) ×3 IMPLANT
SHEATH AVANTI 5FR X 11CM (SHEATH) ×3 IMPLANT
WIRE GUIDERIGHT .035X150 (WIRE) ×3 IMPLANT

## 2017-12-19 NOTE — Consult Note (Signed)
Clayton Sawyer is a 77 y.o. male  [id number]  Primary Cardiologist: Neoma Laming Reason for Consultation: chest pain  HPI: 77 year old white male with a past medical history of coronary artery disease status post PCI and stenting of the LAD presented to the hospital  For catheterization after CTA coronaries were abnormal.   Review of Systems: no orthopnea PND or leg swelling.   Past Medical History:  Diagnosis Date  . Abdominal hernia without obstruction or gangrene 05/22/2014  . 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 March 2006 reports two drug-eluting stents placed in the right coronary artery (reported this procedure not currently available).  2. December 2006 cardiac catheterization:  EF 78%, 50% stenosis in the ramus intermedius and the right posterior diagonal artery.  Patient treated medically.  3. December 2006 cardiac MRI negative for ischemia.  EF normal.  4. April 2008 cardiac catheteri  . Calculus of kidney 09/26/2011   Overview:  1. Has bilateral retained kidney stones.  2. Status post urethral stenting.  Overview:  Overview:  1. Has bilateral retained kidney stones.  2. Status post urethral stenting.   . Chronotropic incompetence 07/22/2011   Overview:  Status post permanent pacemaker placement 06/12/2009 by Dr. Norm Salt.  Overview:  Overview:  Status post permanent pacemaker placement 06/12/2009 by Dr. Norm Salt.   . Diastolic dysfunction 13/03/6577   Overview:  1. June 2009 had progressive orthopnea and dyspnea on exertion and lower extremity edema.  2. July 2009 2D echocardiogram: EF >55%, mild concentric LVH, diastolic dysfunction Grade II, LAE 4.6 cm, mild tricuspid regurgitation.  Overview:  Overview:  1. June 2009 had progressive orthopnea and dyspnea on exertion and lower extremity edema.  2. July 2009 2D echocardiogram: EF >55%, mild   . Essential (primary) hypertension 07/22/2011  .  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   . Hyperlipidemia 07/22/2011   Overview:  1. December 2006 TC 142, TG 92, HDL 32, LDL 93.  2. November 2008 TC 181, TG 97, HDL 29, LDL 33.  3. January 2010 LDL 137, HDL 29.  4. Referral to Dr. Harrell Gave clinic. Patient started on Niacin, which he did not tolerate. Additionally started on Crestor alternating 2.5 mg every other day, which he also was unable to tolerate. Overview:  Overview:  1. December 2006 TC 142, TG 92, HDL 3  . Hypertension   . Malignant neoplasm of prostate ([place]) 03/04/2013  . MI, old   . Migraine without status migrainosus, not intractable 05/22/2014  . Osteoarthritis 09/26/2011   Overview:  Right total knee replacement.  Overview:  Overview:  Right total knee replacement.   . Presence of cardiac pacemaker 07/21/2011   Overview:  Dual-chamber pacemaker is a Biotronik via DR reference number [id number] serial number [id number].  Right atrial lead is a Comptroller number [id number] serial number [id number].  Right ventricular lead is Biotronik Setrox [id number] reference number is [id number] serial number [id number]. Device implanted 06/11/2009 Overview:  Overview:  Dual-chamber pacemaker is a Biotronik via DR reference n  . Varicella without complication 64/40/3474    Medications Prior to Admission  Medication Sig Dispense Refill  . aspirin EC 81 MG tablet Take 81 mg by mouth daily.    . clopidogrel (PLAVIX) 75 MG tablet Take 75 mg  by mouth daily.     . Investigational - Study Medication Take 180 mg by mouth daily. Study name: Bempedoic Acid 180 mg Cholesterol Additional study details: Dr.Anderson    . metoprolol succinate (TOPROL-XL) 25 MG 24 hr tablet Take 25 mg by mouth daily.     . pantoprazole (PROTONIX) 40 MG tablet Take 1 tablet (40 mg  total) by mouth 2 (two) times daily. (Patient taking differently: Take 20 mg by mouth daily. ) 60 tablet 1     . [START ON 12/20/2017] aspirin  81 mg Oral Pre-Cath  . sodium chloride flush  3 mL Intravenous Q12H    Infusions: . sodium chloride    . [START ON 12/20/2017] sodium chloride     Followed by  . [START ON 12/20/2017] sodium chloride      Allergies  Allergen Reactions  . Lisinopril Cough  . Losartan Rash  . Statins Other (See Comments)    Body aches    Social History   Socioeconomic History  . Marital status: Married    Spouse name: Not on file  . Number of children: Not on file  . Years of education: Not on file  . Highest education level: Not on file  Occupational History  . Not on file  Social Needs  . Financial resource strain: Not on file  . Food insecurity:    Worry: Not on file    Inability: Not on file  . Transportation needs:    Medical: Not on file    Non-medical: Not on file  Tobacco Use  . Smoking status: Never Smoker  . Smokeless tobacco: Never Used  Substance and Sexual Activity  . Alcohol use: No  . Drug use: No  . Sexual activity: Not on file  Lifestyle  . Physical activity:    Days per week: Not on file    Minutes per session: Not on file  . Stress: Not on file  Relationships  . Social connections:    Talks on phone: Not on file    Gets together: Not on file    Attends religious service: Not on file    Active member of club or organization: Not on file    Attends meetings of clubs or organizations: Not on file    Relationship status: Not on file  . Intimate partner violence:    Fear of current or ex partner: Not on file    Emotionally abused: Not on file    Physically abused: Not on file    Forced sexual activity: Not on file  Other Topics Concern  . Not on file  Social History Narrative  . Not on file    Family History  Problem Relation Age of Onset  . Prostate cancer Neg Hx   . Bladder Cancer Neg Hx   . Kidney cancer  Neg Hx     PHYSICAL EXAM: Vitals:   12/19/17 0712  BP: (!) 152/100  Pulse: 71  Resp: 20  Temp: 98.2 F (36.8 C)  SpO2: 96%    No intake or output data in the 24 hours ending 12/19/17 0731  General:  Well appearing. No respiratory difficulty HEENT: normal Neck: supple. no JVD. Carotids 2+ bilat; no bruits. No lymphadenopathy or thryomegaly appreciated. Cor: PMI nondisplaced. Regular rate & rhythm. No rubs, gallops or murmurs. Lungs: clear Abdomen: soft, nontender, nondistended. No hepatosplenomegaly. No bruits or masses. Good bowel sounds. Extremities: no cyanosis, clubbing, rash, edema Neuro: alert & oriented x 3, cranial nerves grossly intact.  moves all 4 extremities w/o difficulty. Affect pleasant.  ECG: normal sinus rhythm no acute changes  No results found for this or any previous visit (from the past 24 hour(s)). No results found.   ASSESSMENT AND PLAN: unstable angina with stents in the mid LAD with 70-80% disease midportion of LAD in between the stents. This was seen in CTA coronaries and will do cardiac catheterization today.  Meili Kleckley A

## 2018-01-15 ENCOUNTER — Encounter: Payer: Self-pay | Admitting: Urology

## 2018-01-15 ENCOUNTER — Ambulatory Visit: Payer: Medicare Other | Admitting: Urology

## 2018-01-15 VITALS — BP 144/72 | HR 60 | Ht 69.0 in | Wt 235.4 lb

## 2018-01-15 DIAGNOSIS — C61 Malignant neoplasm of prostate: Secondary | ICD-10-CM | POA: Diagnosis not present

## 2018-01-15 NOTE — Progress Notes (Signed)
01/15/2018 10:57 AM   Clayton Sawyer 05/19/1941 [id number]  Referring provider: Maryland Pink, MD [address] [facility] [place], [place] [address]  Chief Complaint  Patient presents with  . Prostate Cancer    HPI: 76 year old male presents for follow-up of prostate cancer.  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  PSA nadir was 0.5.  He has had a slowly rising PSA though has been asymptomatic.  He was last seen December 2018.  He has no voiding complaints.  Denies bony pain or gross hematuria.  PSA results as follows:  11/2013    0.5 05/2014  0.7 06/2015  0.95 12/2015    0.96 05/2016  1.14 09/2016    1.25 11/2016    1.53 07/2017  1.9    PMH: Past Medical History:  Diagnosis Date  . Abdominal hernia without obstruction or gangrene 05/22/2014  . 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 March 2006 reports two drug-eluting stents placed in the right coronary artery (reported this procedure not currently available).  2. December 2006 cardiac catheterization:  EF 78%, 50% stenosis in the ramus intermedius and the right posterior diagonal artery.  Patient treated medically.  3. December 2006 cardiac MRI negative for ischemia.  EF normal.  4. April 2008 cardiac catheteri  . Calculus of kidney 09/26/2011   Overview:  1. Has bilateral retained kidney stones.  2. Status post urethral stenting.  Overview:  Overview:  1. Has bilateral retained kidney stones.  2. Status post urethral stenting.   . Chronotropic incompetence 07/22/2011   Overview:  Status post permanent pacemaker placement 06/12/2009 by Dr. Norm Salt.  Overview:  Overview:  Status post permanent pacemaker placement 06/12/2009 by Dr. Norm Salt.   . Diastolic dysfunction 10/93/2355   Overview:  1. June 2009 had progressive orthopnea and dyspnea on exertion and lower extremity  edema.  2. July 2009 2D echocardiogram: EF >55%, mild concentric LVH, diastolic dysfunction Grade II, LAE 4.6 cm, mild tricuspid regurgitation.  Overview:  Overview:  1. June 2009 had progressive orthopnea and dyspnea on exertion and lower extremity edema.  2. July 2009 2D echocardiogram: EF >55%, mild   . Essential (primary) hypertension 07/22/2011  . 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   . Hyperlipidemia 07/22/2011   Overview:  1. December 2006 TC 142, TG 92, HDL 32, LDL 93.  2. November 2008 TC 181, TG 97, HDL 29, LDL 33.  3. January 2010 LDL 137, HDL 29.  4. Referral to Dr. Harrell Gave clinic. Patient started on Niacin, which he did not tolerate. Additionally started on Crestor alternating 2.5 mg every other day, which he also was unable to tolerate. Overview:  Overview:  1. December 2006 TC 142, TG 92, HDL 3  . Hypertension   . Malignant neoplasm of prostate ([place]) 03/04/2013  . MI, old   . Migraine without status migrainosus, not intractable 05/22/2014  . Osteoarthritis 09/26/2011   Overview:  Right total knee replacement.  Overview:  Overview:  Right total knee replacement.   . Presence of cardiac pacemaker 07/21/2011   Overview:  Dual-chamber pacemaker is a Biotronik via DR reference number [id number] serial number [id number].  Right atrial lead is a Comptroller number [id number]  serial number [id number].  Right ventricular lead is Biotronik Setrox [id number] reference number is [id number] serial number [id number]. Device implanted 06/11/2009 Overview:  Overview:  Dual-chamber pacemaker is a Biotronik via DR reference n  . Varicella without complication 61/44/3154    Surgical History: Past Surgical History:  Procedure Laterality Date  . CARDIAC CATHETERIZATION Right 04/12/2016   Procedure: Left  Heart Cath and Coronary Angiography;  Surgeon: Dionisio David, MD;  Location: [facility];  Service: Cardiovascular;  Laterality: Right;  . knee replacements    . LEFT HEART CATH AND CORONARY ANGIOGRAPHY Left 12/19/2017   Procedure: LEFT HEART CATH AND CORONARY ANGIOGRAPHY;  Surgeon: Dionisio David, MD;  Location: [facility];  Service: Cardiovascular;  Laterality: Left;  . stents     cardiac stents    Home Medications:  Allergies as of 01/15/2018      Reactions   Lisinopril Cough   Losartan Rash   Statins Other (See Comments)   Body aches      Medication List        Accurate as of 01/15/18 10:57 AM. Always use your most recent med list.          aspirin EC 81 MG tablet Take 81 mg by mouth daily.   clopidogrel 75 MG tablet Commonly known as:  PLAVIX Take 75 mg by mouth daily.   Investigational - Study Medication Take 180 mg by mouth daily. Study name: Bempedoic Acid 180 mg Cholesterol Additional study details: Dr.Anderson   metoprolol succinate 25 MG 24 hr tablet Commonly known as:  TOPROL-XL Take 25 mg by mouth daily.   nitroGLYCERIN 0.4 MG SL tablet Commonly known as:  NITROSTAT   pantoprazole 40 MG tablet Commonly known as:  PROTONIX Take 1 tablet (40 mg total) by mouth 2 (two) times daily.       Allergies:  Allergies  Allergen Reactions  . Lisinopril Cough  . Losartan Rash  . Statins Other (See Comments)    Body aches    Family History: Family History  Problem Relation Age of Onset  . Prostate cancer Neg Hx   . Bladder Cancer Neg Hx   . Kidney cancer Neg Hx     Social History:  reports that he has never smoked. He has never used smokeless tobacco. He reports that he does not drink alcohol or use drugs.  ROS: UROLOGY Frequent Urination?: No Hard to postpone urination?: No Burning/pain with urination?: No Get up at night to urinate?: No Leakage of urine?: No Urine stream starts and stops?: No Trouble starting stream?:  No Do you have to strain to urinate?: No Blood in urine?: No Urinary tract infection?: No Sexually transmitted disease?: No Injury to kidneys or bladder?: No Painful intercourse?: No Weak stream?: No Erection problems?: No Penile pain?: No  Gastrointestinal Nausea?: No Vomiting?: No Indigestion/heartburn?: No Diarrhea?: No Constipation?: No  Constitutional Fever: No Night sweats?: No Weight loss?: No Fatigue?: No  Skin Skin rash/lesions?: No Itching?: No  Eyes Blurred vision?: No Double vision?: No  Ears/Nose/Throat Sore throat?: No Sinus problems?: No  Hematologic/Lymphatic Swollen glands?: No Easy bruising?: No  Cardiovascular Leg swelling?: No Chest pain?: No  Respiratory Cough?: No Shortness of breath?: No  Endocrine Excessive thirst?: No  Musculoskeletal Back pain?: No Joint pain?: No  Neurological Headaches?: No Dizziness?: No  Psychologic Depression?: No Anxiety?: No  Physical Exam: BP (!) 144/72 (BP Location: Left Arm, Patient Position: Sitting, Cuff Size: Large)   Pulse 60  Ht 5\' 9"  (1.753 m)   Wt 235 lb 6.4 oz (106.8 kg)   SpO2 99%   BMI 34.76 kg/m   Constitutional:  Alert and oriented, No acute distress. HEENT: [place] AT, moist mucus membranes.  Trachea midline, no masses. Cardiovascular: No clubbing, cyanosis, or edema. Respiratory: Normal respiratory effort, no increased work of breathing. GI: Abdomen is soft, nontender, nondistended, no abdominal masses GU: No CVA tenderness.  DRE last visit unremarkable Lymph: No cervical or inguinal lymphadenopathy. Skin: No rashes, bruises or suspicious lesions. Neurologic: Grossly intact, no focal deficits, moving all 4 extremities. Psychiatric: Normal mood and affect.   Assessment & Plan:   77 year old male with adenocarcinoma the prostate status post brachytherapy with a slowly rising PSA.  He is not interested in salvage therapies and has elected surveillance.  PSA ordered today and  if increasing will obtain a CT.   Return in about 6 months (around 07/17/2018) for Recheck, PSA.  Abbie Sons, [facility] [address] [place], [place] [address] [phone]

## 2018-01-16 ENCOUNTER — Encounter: Payer: Self-pay | Admitting: Family Medicine

## 2018-01-16 ENCOUNTER — Telehealth: Payer: Self-pay | Admitting: Family Medicine

## 2018-01-16 LAB — PSA: Prostate Specific Ag, Serum: 2 ng/mL (ref 0.0–4.0)

## 2018-01-16 NOTE — Telephone Encounter (Signed)
Letter Sent.

## 2018-01-16 NOTE — Telephone Encounter (Signed)
-----   Message from Abbie Sons, MD sent at 01/16/2018  8:01 AM EDT ----- PSA stable at 2.0-follow-up as scheduled

## 2018-07-16 ENCOUNTER — Encounter: Payer: Self-pay | Admitting: Urology

## 2018-07-16 ENCOUNTER — Ambulatory Visit: Payer: Medicare Other | Admitting: Urology

## 2018-07-16 VITALS — BP 149/75 | HR 60 | Ht 69.0 in | Wt 239.1 lb

## 2018-07-16 DIAGNOSIS — C61 Malignant neoplasm of prostate: Secondary | ICD-10-CM

## 2018-07-16 NOTE — Progress Notes (Signed)
07/16/2018  1:26 PM   Clayton Sawyer Nov 27, 1940 [id number]  Referring provider: Maryland Pink, MD [address] [facility] [place], [place] [address]  Chief Complaint  Patient presents with  . Follow-up  . Prostate Cancer    Urological History: 1. Prostate cancer  - intermediate risk T1c adenocarcinoma of prostate. PSA 0.5  - 05/2010 brachytherapy  - slow rising PSA, asymptomatic  - Not interested in salvage therapies and has elected surveillance  HPI: Clayton Sawyer is a 77 y.o. male presenting for a follow up of prostate cancer  - Reports stable urinary symptoms  - Denies bony pain, dysuria, gross hematuria or flank/abdominal/pelvic/scrotal pain.   - slow rising PSA trend below:   11/2013     0.5    05/2014     0.7   06/2015     0.95   12/2015  0.96   05/2016    1.14   09/2016  1.25   11/2016  1.53   07/2017    1.9   12/2017     2.0  PMH: Past Medical History:  Diagnosis Date  . Abdominal hernia without obstruction or gangrene 05/22/2014  . 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 March 2006 reports two drug-eluting stents placed in the right coronary artery (reported this procedure not currently available).  2. December 2006 cardiac catheterization:  EF 78%, 50% stenosis in the ramus intermedius and the right posterior diagonal artery.  Patient treated medically.  3. December 2006 cardiac MRI negative for ischemia.  EF normal.  4. April 2008 cardiac catheteri  . Calculus of kidney 09/26/2011   Overview:  1. Has bilateral retained kidney stones.  2. Status post urethral stenting.  Overview:  Overview:  1. Has bilateral retained kidney stones.  2. Status post urethral stenting.   . Chronotropic incompetence 07/22/2011   Overview:  Status post permanent pacemaker placement 06/12/2009 by Dr. Norm Salt.  Overview:  Overview:  Status post permanent pacemaker placement 06/12/2009 by  Dr. Norm Salt.   . Diastolic dysfunction 24/82/5003   Overview:  1. June 2009 had progressive orthopnea and dyspnea on exertion and lower extremity edema.  2. July 2009 2D echocardiogram: EF >55%, mild concentric LVH, diastolic dysfunction Grade II, LAE 4.6 cm, mild tricuspid regurgitation.  Overview:  Overview:  1. June 2009 had progressive orthopnea and dyspnea on exertion and lower extremity edema.  2. July 2009 2D echocardiogram: EF >55%, mild   . Essential (primary) hypertension 07/22/2011  . 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   . Hyperlipidemia 07/22/2011   Overview:  1. December 2006 TC 142, TG 92, HDL 32, LDL 93.  2. November 2008 TC 181, TG 97, HDL 29, LDL 33.  3. January 2010 LDL 137, HDL 29.  4. Referral to Dr. Harrell Gave clinic. Patient started on Niacin, which he did not tolerate. Additionally started on Crestor alternating 2.5 mg every other day, which he also was unable to tolerate. Overview:  Overview:  1. December 2006 TC 142, TG 92, HDL 3  . Hypertension   . Malignant neoplasm of prostate ([place]) 03/04/2013  . MI, old   . Migraine without status migrainosus, not intractable 05/22/2014  . Osteoarthritis 09/26/2011   Overview:  Right total knee replacement.  Overview:  Overview:  Right total knee replacement.   . Presence of cardiac pacemaker 07/21/2011   Overview:  Dual-chamber pacemaker is a Biotronik via DR reference number [id number] serial number [id number].  Right atrial lead is a Comptroller number [id number] serial number [id number].  Right ventricular lead is Biotronik Setrox [id number] reference number is [id number] serial number [id number]. Device implanted 06/11/2009 Overview:  Overview:  Dual-chamber pacemaker is a Biotronik via DR reference n  . Varicella without complication  19/50/9326   Surgical History: Past Surgical History:  Procedure Laterality Date  . CARDIAC CATHETERIZATION Right 04/12/2016   Procedure: Left Heart Cath and Coronary Angiography;  Surgeon: Dionisio David, MD;  Location: [facility];  Service: Cardiovascular;  Laterality: Right;  . knee replacements    . LEFT HEART CATH AND CORONARY ANGIOGRAPHY Left 12/19/2017   Procedure: LEFT HEART CATH AND CORONARY ANGIOGRAPHY;  Surgeon: Dionisio David, MD;  Location: [facility];  Service: Cardiovascular;  Laterality: Left;  . stents     cardiac stents   Home Medications:  Allergies as of 07/16/2018      Reactions   Lisinopril Cough   Losartan Rash   Statins Other (See Comments)   Body aches      Medication List       Accurate as of July 16, 2018  1:26 PM. Always use your most recent med list.        aspirin EC 81 MG tablet Take 81 mg by mouth daily.   clopidogrel 75 MG tablet Commonly known as:  PLAVIX Take 75 mg by mouth daily.   Investigational - Study Medication Take 180 mg by mouth daily. Study name: Bempedoic Acid 180 mg Cholesterol Additional study details: Dr.Anderson   metoprolol succinate 25 MG 24 hr tablet Commonly known as:  TOPROL-XL Take 25 mg by mouth daily.   nitroGLYCERIN 0.4 MG SL tablet Commonly known as:  NITROSTAT   pantoprazole 40 MG tablet Commonly known as:  PROTONIX Take 1 tablet (40 mg total) by mouth 2 (two) times daily.      Allergies:  Allergies  Allergen Reactions  . Lisinopril Cough  . Losartan Rash  . Statins Other (See Comments)    Body aches   Family History: Family History  Problem Relation Age of Onset  . Prostate cancer Neg Hx   . Bladder Cancer Neg Hx   . Kidney cancer Neg Hx    Social History:  reports that he has never smoked. He has never used smokeless tobacco. He reports that he does not drink alcohol or use drugs.  ROS: UROLOGY Frequent Urination?: No Hard to postpone urination?:  No Burning/pain with urination?: No Get up at night to urinate?: No Leakage of urine?: No Urine stream starts and stops?: No Trouble starting stream?: No Do you have to strain to urinate?: No Blood in urine?: No Urinary tract infection?: No Sexually transmitted disease?: No Injury to kidneys or bladder?: No Painful intercourse?: No Weak stream?: No Erection problems?: No Penile pain?: No  Gastrointestinal Nausea?: No Vomiting?: No Indigestion/heartburn?: No Diarrhea?: No Constipation?: No  Constitutional Fever: No Night sweats?: No Weight loss?: No Fatigue?: No  Skin Skin rash/lesions?: No Itching?: No  Eyes Blurred vision?: No Double vision?: No  Ears/Nose/Throat Sore throat?: No Sinus problems?: No  Hematologic/Lymphatic Swollen glands?: No Easy bruising?: No  Cardiovascular Leg swelling?: No Chest pain?: No  Respiratory Cough?: No Shortness of breath?: No  Endocrine Excessive thirst?: No  Musculoskeletal Back pain?: No  Joint pain?: No  Neurological Headaches?: No Dizziness?: No  Psychologic Depression?: No Anxiety?: No  Physical Exam: BP (!) 149/75 (BP Location: Left Arm, Patient Position: Sitting)   Pulse 60   Ht 5\' 9"  (1.753 m)   Wt 239 lb 1.6 oz (108.5 kg)   BMI 35.31 kg/m   Constitutional:  Alert and oriented, No acute distress. Respiratory: Normal respiratory effort, no increased work of breathing. GU: No CVA tenderness Skin: No rashes, bruises or suspicious lesions. Neurologic: Grossly intact, no focal deficits, moving all 4 extremities. Psychiatric: Normal mood and affect.  Assessment & Plan:   1. Prostate Cancer  - Adenocarcinoma s/p brachytherapy with slow rising PSA  - Stable urinary symptoms  - Last PSA was 2.0 (12/2017). PSA drawn today, results pending. If increasing, will obtain CT.  - Continue surveillance. Patient not interested in salvage therapies at this time.  - Return in 6 months for lab work, 1 year for  office visit  Return in about 6 months (around 01/15/2019) for PSA lab visit.  Abbie Sons, MD  [facility] [address], [place], [place] [address] [phone]  I, Stephania Fragmin , am acting as a scribe for [employer], MD  I, Abbie Sons, MD, have reviewed all documentation for this visit. The documentation on 07/16/18 for the exam, diagnosis, procedures, and orders are all accurate and complete.

## 2018-07-17 ENCOUNTER — Telehealth: Payer: Self-pay | Admitting: Urology

## 2018-07-17 LAB — PSA: PROSTATE SPECIFIC AG, SERUM: 2.4 ng/mL (ref 0.0–4.0)

## 2018-07-17 NOTE — Telephone Encounter (Signed)
Pt ended up returning call and I spoke with him and informed him of his lab result. Pt understood and had no additional questions at this time.     Stoioff, Ronda Fairly, MD  [facility]        PSA slightly higher at 2.4. Follow-up as scheduled

## 2019-01-15 ENCOUNTER — Other Ambulatory Visit: Payer: Medicare Other

## 2019-01-21 ENCOUNTER — Other Ambulatory Visit: Payer: Medicare Other

## 2019-01-21 ENCOUNTER — Other Ambulatory Visit: Payer: Self-pay | Admitting: *Deleted

## 2019-01-21 DIAGNOSIS — C61 Malignant neoplasm of prostate: Secondary | ICD-10-CM

## 2019-01-21 NOTE — Addendum Note (Signed)
Addended by: Verlene Mayer A on: 01/21/2019 03:07 PM   Modules accepted: Orders

## 2019-01-22 ENCOUNTER — Other Ambulatory Visit: Payer: Self-pay

## 2019-01-22 ENCOUNTER — Other Ambulatory Visit: Payer: Medicare Other

## 2019-01-22 DIAGNOSIS — C61 Malignant neoplasm of prostate: Secondary | ICD-10-CM

## 2019-01-23 LAB — PSA: Prostate Specific Ag, Serum: 2.8 ng/mL (ref 0.0–4.0)

## 2019-01-24 ENCOUNTER — Telehealth: Payer: Self-pay | Admitting: *Deleted

## 2019-01-24 NOTE — Telephone Encounter (Addendum)
Informed patient-verbalized understanding.  ----- Message from Abbie Sons, MD sent at 01/24/2019  8:01 AM EDT ----- PSA slightly higher at 2.8.  Would continue to monitor and keep December follow-up.

## 2019-02-08 ENCOUNTER — Other Ambulatory Visit: Payer: Medicare Other

## 2019-02-08 ENCOUNTER — Telehealth: Payer: Self-pay | Admitting: *Deleted

## 2019-02-08 DIAGNOSIS — Z20822 Contact with and (suspected) exposure to covid-19: Secondary | ICD-10-CM

## 2019-02-08 NOTE — Telephone Encounter (Signed)
Spoke with pt's wife Enid Derry to schedule testing because he is in the shower; she accepted appointment on behalf of the pt at the [facility] site 02/08/2019 at 81; Mrs Grandison states that she is aware of the location; pt's wife reminded that all occupants of the vehicle should wear masks; she verbalized understanding; orders placed per protocol.

## 2019-02-08 NOTE — Telephone Encounter (Signed)
Contacted by Holli Humbles, CNA on behalf of Dr Pernell Dupre, [facility] requesting that the pt be tested for COVID due to symptoms; pt's DOB, address, and contact [phone] verified; will attempt to contact pt.

## 2019-02-13 ENCOUNTER — Other Ambulatory Visit: Payer: Self-pay

## 2019-02-13 ENCOUNTER — Emergency Department: Payer: Medicare Other

## 2019-02-13 ENCOUNTER — Inpatient Hospital Stay
Admission: EM | Admit: 2019-02-13 | Discharge: 2019-02-13 | DRG: 177 | Disposition: A | Discharge status: Other Acute Inpatient Hospital | Payer: Medicare Other | Attending: Internal Medicine | Admitting: Internal Medicine

## 2019-02-13 ENCOUNTER — Inpatient Hospital Stay (HOSPITAL_COMMUNITY)
Admission: AD | Admit: 2019-02-13 | Discharge: 2019-04-02 | DRG: 207 | Disposition: E | Payer: Medicare Other | Source: Other Acute Inpatient Hospital | Attending: Internal Medicine | Admitting: Internal Medicine

## 2019-02-13 DIAGNOSIS — F419 Anxiety disorder, unspecified: Secondary | ICD-10-CM | POA: Diagnosis present

## 2019-02-13 DIAGNOSIS — I5032 Chronic diastolic (congestive) heart failure: Secondary | ICD-10-CM | POA: Diagnosis present

## 2019-02-13 DIAGNOSIS — I2510 Atherosclerotic heart disease of native coronary artery without angina pectoris: Secondary | ICD-10-CM | POA: Diagnosis present

## 2019-02-13 DIAGNOSIS — E870 Hyperosmolality and hypernatremia: Secondary | ICD-10-CM | POA: Diagnosis not present

## 2019-02-13 DIAGNOSIS — J9601 Acute respiratory failure with hypoxia: Secondary | ICD-10-CM | POA: Diagnosis present

## 2019-02-13 DIAGNOSIS — R0902 Hypoxemia: Secondary | ICD-10-CM

## 2019-02-13 DIAGNOSIS — G4700 Insomnia, unspecified: Secondary | ICD-10-CM | POA: Diagnosis present

## 2019-02-13 DIAGNOSIS — Z8673 Personal history of transient ischemic attack (TIA), and cerebral infarction without residual deficits: Secondary | ICD-10-CM

## 2019-02-13 DIAGNOSIS — R0689 Other abnormalities of breathing: Secondary | ICD-10-CM | POA: Diagnosis not present

## 2019-02-13 DIAGNOSIS — Z888 Allergy status to other drugs, medicaments and biological substances status: Secondary | ICD-10-CM

## 2019-02-13 DIAGNOSIS — Y95 Nosocomial condition: Secondary | ICD-10-CM | POA: Diagnosis present

## 2019-02-13 DIAGNOSIS — Z6834 Body mass index (BMI) 34.0-34.9, adult: Secondary | ICD-10-CM | POA: Diagnosis not present

## 2019-02-13 DIAGNOSIS — Z7982 Long term (current) use of aspirin: Secondary | ICD-10-CM

## 2019-02-13 DIAGNOSIS — Z79899 Other long term (current) drug therapy: Secondary | ICD-10-CM

## 2019-02-13 DIAGNOSIS — I110 Hypertensive heart disease with heart failure: Secondary | ICD-10-CM | POA: Diagnosis present

## 2019-02-13 DIAGNOSIS — D72810 Lymphocytopenia: Secondary | ICD-10-CM | POA: Diagnosis present

## 2019-02-13 DIAGNOSIS — J151 Pneumonia due to Pseudomonas: Secondary | ICD-10-CM | POA: Diagnosis not present

## 2019-02-13 DIAGNOSIS — Z9911 Dependence on respirator [ventilator] status: Secondary | ICD-10-CM

## 2019-02-13 DIAGNOSIS — Z955 Presence of coronary angioplasty implant and graft: Secondary | ICD-10-CM | POA: Diagnosis not present

## 2019-02-13 DIAGNOSIS — E875 Hyperkalemia: Secondary | ICD-10-CM | POA: Diagnosis not present

## 2019-02-13 DIAGNOSIS — A4152 Sepsis due to Pseudomonas: Secondary | ICD-10-CM | POA: Diagnosis not present

## 2019-02-13 DIAGNOSIS — L89156 Pressure-induced deep tissue damage of sacral region: Secondary | ICD-10-CM | POA: Diagnosis not present

## 2019-02-13 DIAGNOSIS — Z7902 Long term (current) use of antithrombotics/antiplatelets: Secondary | ICD-10-CM | POA: Diagnosis not present

## 2019-02-13 DIAGNOSIS — I252 Old myocardial infarction: Secondary | ICD-10-CM

## 2019-02-13 DIAGNOSIS — I471 Supraventricular tachycardia: Secondary | ICD-10-CM | POA: Diagnosis not present

## 2019-02-13 DIAGNOSIS — I10 Essential (primary) hypertension: Secondary | ICD-10-CM | POA: Diagnosis not present

## 2019-02-13 DIAGNOSIS — T380X5A Adverse effect of glucocorticoids and synthetic analogues, initial encounter: Secondary | ICD-10-CM | POA: Diagnosis not present

## 2019-02-13 DIAGNOSIS — R0600 Dyspnea, unspecified: Secondary | ICD-10-CM

## 2019-02-13 DIAGNOSIS — Z950 Presence of cardiac pacemaker: Secondary | ICD-10-CM

## 2019-02-13 DIAGNOSIS — Z515 Encounter for palliative care: Secondary | ICD-10-CM

## 2019-02-13 DIAGNOSIS — I4589 Other specified conduction disorders: Secondary | ICD-10-CM | POA: Diagnosis present

## 2019-02-13 DIAGNOSIS — I4892 Unspecified atrial flutter: Secondary | ICD-10-CM | POA: Diagnosis not present

## 2019-02-13 DIAGNOSIS — J1289 Other viral pneumonia: Secondary | ICD-10-CM | POA: Diagnosis present

## 2019-02-13 DIAGNOSIS — L8990 Pressure ulcer of unspecified site, unspecified stage: Secondary | ICD-10-CM | POA: Insufficient documentation

## 2019-02-13 DIAGNOSIS — D696 Thrombocytopenia, unspecified: Secondary | ICD-10-CM | POA: Diagnosis not present

## 2019-02-13 DIAGNOSIS — E669 Obesity, unspecified: Secondary | ICD-10-CM | POA: Diagnosis present

## 2019-02-13 DIAGNOSIS — U071 COVID-19: Secondary | ICD-10-CM | POA: Diagnosis present

## 2019-02-13 DIAGNOSIS — R579 Shock, unspecified: Secondary | ICD-10-CM | POA: Diagnosis not present

## 2019-02-13 DIAGNOSIS — Z87442 Personal history of urinary calculi: Secondary | ICD-10-CM

## 2019-02-13 DIAGNOSIS — G4733 Obstructive sleep apnea (adult) (pediatric): Secondary | ICD-10-CM | POA: Diagnosis present

## 2019-02-13 DIAGNOSIS — I469 Cardiac arrest, cause unspecified: Secondary | ICD-10-CM | POA: Diagnosis not present

## 2019-02-13 DIAGNOSIS — R0603 Acute respiratory distress: Secondary | ICD-10-CM | POA: Diagnosis not present

## 2019-02-13 DIAGNOSIS — Z96651 Presence of right artificial knee joint: Secondary | ICD-10-CM | POA: Diagnosis present

## 2019-02-13 DIAGNOSIS — F29 Unspecified psychosis not due to a substance or known physiological condition: Secondary | ICD-10-CM | POA: Diagnosis present

## 2019-02-13 DIAGNOSIS — E785 Hyperlipidemia, unspecified: Secondary | ICD-10-CM | POA: Diagnosis present

## 2019-02-13 DIAGNOSIS — Z66 Do not resuscitate: Secondary | ICD-10-CM | POA: Diagnosis present

## 2019-02-13 DIAGNOSIS — J9312 Secondary spontaneous pneumothorax: Secondary | ICD-10-CM | POA: Diagnosis not present

## 2019-02-13 DIAGNOSIS — J939 Pneumothorax, unspecified: Secondary | ICD-10-CM | POA: Diagnosis not present

## 2019-02-13 DIAGNOSIS — Z8546 Personal history of malignant neoplasm of prostate: Secondary | ICD-10-CM

## 2019-02-13 DIAGNOSIS — T4275XA Adverse effect of unspecified antiepileptic and sedative-hypnotic drugs, initial encounter: Secondary | ICD-10-CM | POA: Diagnosis not present

## 2019-02-13 DIAGNOSIS — N179 Acute kidney failure, unspecified: Secondary | ICD-10-CM | POA: Diagnosis not present

## 2019-02-13 DIAGNOSIS — Z0189 Encounter for other specified special examinations: Secondary | ICD-10-CM

## 2019-02-13 DIAGNOSIS — R739 Hyperglycemia, unspecified: Secondary | ICD-10-CM | POA: Diagnosis not present

## 2019-02-13 DIAGNOSIS — D7589 Other specified diseases of blood and blood-forming organs: Secondary | ICD-10-CM | POA: Diagnosis not present

## 2019-02-13 DIAGNOSIS — J069 Acute upper respiratory infection, unspecified: Secondary | ICD-10-CM | POA: Diagnosis not present

## 2019-02-13 DIAGNOSIS — R040 Epistaxis: Secondary | ICD-10-CM | POA: Diagnosis not present

## 2019-02-13 DIAGNOSIS — I952 Hypotension due to drugs: Secondary | ICD-10-CM | POA: Diagnosis not present

## 2019-02-13 DIAGNOSIS — G43909 Migraine, unspecified, not intractable, without status migrainosus: Secondary | ICD-10-CM | POA: Diagnosis present

## 2019-02-13 DIAGNOSIS — R6521 Severe sepsis with septic shock: Secondary | ICD-10-CM | POA: Diagnosis not present

## 2019-02-13 DIAGNOSIS — Z01818 Encounter for other preprocedural examination: Secondary | ICD-10-CM

## 2019-02-13 DIAGNOSIS — Z7189 Other specified counseling: Secondary | ICD-10-CM

## 2019-02-13 DIAGNOSIS — R0602 Shortness of breath: Secondary | ICD-10-CM | POA: Diagnosis not present

## 2019-02-13 DIAGNOSIS — I4891 Unspecified atrial fibrillation: Secondary | ICD-10-CM | POA: Diagnosis not present

## 2019-02-13 DIAGNOSIS — Z6835 Body mass index (BMI) 35.0-35.9, adult: Secondary | ICD-10-CM

## 2019-02-13 DIAGNOSIS — Z4682 Encounter for fitting and adjustment of non-vascular catheter: Secondary | ICD-10-CM

## 2019-02-13 DIAGNOSIS — J80 Acute respiratory distress syndrome: Secondary | ICD-10-CM | POA: Diagnosis not present

## 2019-02-13 LAB — CBC WITH DIFFERENTIAL/PLATELET
Abs Immature Granulocytes: 0.07 10*3/uL (ref 0.00–0.07)
Basophils Absolute: 0 10*3/uL (ref 0.0–0.1)
Basophils Relative: 0 %
Eosinophils Absolute: 0 10*3/uL (ref 0.0–0.5)
Eosinophils Relative: 0 %
HCT: 44.9 % (ref 39.0–52.0)
Hemoglobin: 15 g/dL (ref 13.0–17.0)
Immature Granulocytes: 1 %
Lymphocytes Relative: 6 %
Lymphs Abs: 0.6 10*3/uL — ABNORMAL LOW (ref 0.7–4.0)
MCH: 30.1 pg (ref 26.0–34.0)
MCHC: 33.4 g/dL (ref 30.0–36.0)
MCV: 90.2 fL (ref 80.0–100.0)
Monocytes Absolute: 0.6 10*3/uL (ref 0.1–1.0)
Monocytes Relative: 6 %
Neutro Abs: 8.7 10*3/uL — ABNORMAL HIGH (ref 1.7–7.7)
Neutrophils Relative %: 87 %
Platelets: 208 10*3/uL (ref 150–400)
RBC: 4.98 MIL/uL (ref 4.22–5.81)
RDW: 14.1 % (ref 11.5–15.5)
WBC: 9.9 10*3/uL (ref 4.0–10.5)
nRBC: 0 % (ref 0.0–0.2)

## 2019-02-13 LAB — COMPREHENSIVE METABOLIC PANEL
ALT: 16 U/L (ref 0–44)
AST: 32 U/L (ref 15–41)
Albumin: 3.4 g/dL — ABNORMAL LOW (ref 3.5–5.0)
Alkaline Phosphatase: 69 U/L (ref 38–126)
Anion gap: 14 (ref 5–15)
BUN: 18 mg/dL (ref 8–23)
CO2: 22 mmol/L (ref 22–32)
Calcium: 8 mg/dL — ABNORMAL LOW (ref 8.9–10.3)
Chloride: 99 mmol/L (ref 98–111)
Creatinine, Ser: 1.1 mg/dL (ref 0.61–1.24)
GFR calc Af Amer: >60 mL/min (ref >60)
GFR calc non Af Amer: >60 mL/min (ref >60)
Glucose, Bld: 139 mg/dL — ABNORMAL HIGH (ref 70–99)
Potassium: 3.9 mmol/L (ref 3.5–5.1)
Sodium: 135 mmol/L (ref 135–145)
Total Bilirubin: 1 mg/dL (ref 0.3–1.2)
Total Protein: 6.8 g/dL (ref 6.5–8.1)

## 2019-02-13 LAB — C-REACTIVE PROTEIN
CRP: 18.5 mg/dL — ABNORMAL HIGH (ref <1.0)
CRP: 18.5 mg/dL — ABNORMAL HIGH (ref <1.0)

## 2019-02-13 LAB — TRIGLYCERIDES: Triglycerides: 156 mg/dL — ABNORMAL HIGH (ref <150)

## 2019-02-13 LAB — LACTIC ACID, PLASMA
Lactic Acid, Venous: 1.9 mmol/L (ref 0.5–1.9)
Lactic Acid, Venous: 1.6 mmol/L (ref 0.5–1.9)

## 2019-02-13 LAB — SARS CORONAVIRUS 2 BY RT PCR (HOSPITAL ORDER, PERFORMED IN ~~LOC~~ HOSPITAL LAB): SARS Coronavirus 2: POSITIVE — AB

## 2019-02-13 LAB — GLUCOSE, CAPILLARY: Glucose-Capillary: 146 mg/dL — ABNORMAL HIGH (ref 70–99)

## 2019-02-13 LAB — PROCALCITONIN: Procalcitonin: 0.35 ng/mL

## 2019-02-13 LAB — FIBRIN DERIVATIVES D-DIMER (ARMC ONLY): Fibrin derivatives D-dimer (ARMC): 1244.59 ng/mL (FEU) — ABNORMAL HIGH (ref 0.00–499.00)

## 2019-02-13 LAB — TSH: TSH: 2.233 u[IU]/mL (ref 0.350–4.500)

## 2019-02-13 LAB — LACTATE DEHYDROGENASE: LDH: 384 U/L — ABNORMAL HIGH (ref 98–192)

## 2019-02-13 LAB — MRSA PCR SCREENING: MRSA by PCR: NEGATIVE

## 2019-02-13 LAB — FIBRINOGEN: Fibrinogen: 748 mg/dL — ABNORMAL HIGH (ref 210–475)

## 2019-02-13 LAB — FERRITIN: Ferritin: 146 ng/mL (ref 24–336)

## 2019-02-13 MED ORDER — ZINC SULFATE 220 (50 ZN) MG PO CAPS: 220.0000 mg | ORAL_CAPSULE | Freq: Every day | ORAL | Status: AC

## 2019-02-13 MED ORDER — ZINC SULFATE 220 (50 ZN) MG PO CAPS
220.0000 mg | ORAL_CAPSULE | Freq: Every day | ORAL | Status: DC
  Administered 2019-02-13: 220 mg via ORAL
  Filled 2019-02-13: qty 1

## 2019-02-13 MED ORDER — AZITHROMYCIN 500 MG PO TABS: 500.0000 mg | ORAL_TABLET | Freq: Every day | ORAL | 0 refills | Status: AC

## 2019-02-13 MED ORDER — ONDANSETRON HCL 4 MG PO TABS: 4.0000 mg | ORAL_TABLET | Freq: Four times a day (QID) | ORAL | Status: DC | PRN

## 2019-02-13 MED ORDER — METHYLPREDNISOLONE SODIUM SUCC 125 MG IJ SOLR: 60.0000 mg | Freq: Two times a day (BID) | INTRAMUSCULAR | Status: DC

## 2019-02-13 MED ORDER — SODIUM CHLORIDE 0.9 % IV SOLN
1.0000 g | INTRAVENOUS | Status: DC
  Administered 2019-02-13: 1 g via INTRAVENOUS
  Filled 2019-02-13: qty 1
  Filled 2019-02-13: qty 10

## 2019-02-13 MED ORDER — AZITHROMYCIN 500 MG PO TABS
250.0000 mg | ORAL_TABLET | Freq: Every day | ORAL | Status: DC
  Filled 2019-02-13: qty 0.5

## 2019-02-13 MED ORDER — SODIUM CHLORIDE 0.9 % IV SOLN
200.0000 mg | Freq: Once | INTRAVENOUS | Status: AC
  Administered 2019-02-13: 200 mg via INTRAVENOUS
  Filled 2019-02-13: qty 40

## 2019-02-13 MED ORDER — ONDANSETRON HCL 4 MG/2ML IJ SOLN: 4.0000 mg | Freq: Four times a day (QID) | INTRAMUSCULAR | Status: DC | PRN

## 2019-02-13 MED ORDER — VITAMIN C 500 MG PO TABS
500.0000 mg | ORAL_TABLET | Freq: Every day | ORAL | Status: DC
  Administered 2019-02-13: 500 mg via ORAL
  Filled 2019-02-13: qty 1

## 2019-02-13 MED ORDER — SODIUM CHLORIDE 0.9 % IV SOLN
100.0000 mg | Freq: Every day | INTRAVENOUS | Status: DC
  Filled 2019-02-13 (×2): qty 20

## 2019-02-13 MED ORDER — PANTOPRAZOLE SODIUM 20 MG PO TBEC: 20.0000 mg | DELAYED_RELEASE_TABLET | Freq: Every day | ORAL | Status: AC

## 2019-02-13 MED ORDER — NITROGLYCERIN 0.4 MG SL SUBL: 0.4000 mg | SUBLINGUAL_TABLET | SUBLINGUAL | Status: DC | PRN

## 2019-02-13 MED ORDER — ACETAMINOPHEN 650 MG RE SUPP: 650.0000 mg | Freq: Four times a day (QID) | RECTAL | Status: DC | PRN

## 2019-02-13 MED ORDER — ASCORBIC ACID 500 MG PO TABS: 500.0000 mg | ORAL_TABLET | Freq: Every day | ORAL | Status: AC

## 2019-02-13 MED ORDER — PANTOPRAZOLE SODIUM 20 MG PO TBEC
20.0000 mg | DELAYED_RELEASE_TABLET | Freq: Every day | ORAL | Status: DC
  Administered 2019-02-13: 20 mg via ORAL
  Filled 2019-02-13 (×2): qty 1

## 2019-02-13 MED ORDER — ENOXAPARIN SODIUM 40 MG/0.4ML ~~LOC~~ SOLN
40.0000 mg | SUBCUTANEOUS | Status: DC
  Administered 2019-02-13: 40 mg via SUBCUTANEOUS
  Filled 2019-02-13: qty 0.4

## 2019-02-13 MED ORDER — METHYLPREDNISOLONE SODIUM SUCC 125 MG IJ SOLR
60.0000 mg | Freq: Every day | INTRAMUSCULAR | Status: DC
  Administered 2019-02-13: 60 mg via INTRAVENOUS
  Filled 2019-02-13: qty 2

## 2019-02-13 MED ORDER — ACETAMINOPHEN 325 MG PO TABS: 650.0000 mg | ORAL_TABLET | Freq: Four times a day (QID) | ORAL | Status: DC | PRN

## 2019-02-13 MED ORDER — DEXAMETHASONE SODIUM PHOSPHATE 10 MG/ML IJ SOLN
INTRAMUSCULAR | Status: AC
  Administered 2019-02-13: 10 mg via INTRAVENOUS
  Filled 2019-02-13: qty 1

## 2019-02-13 MED ORDER — DOCUSATE SODIUM 100 MG PO CAPS
100.0000 mg | ORAL_CAPSULE | Freq: Two times a day (BID) | ORAL | Status: DC
  Administered 2019-02-13: 100 mg via ORAL
  Filled 2019-02-13: qty 1

## 2019-02-13 MED ORDER — METOPROLOL SUCCINATE ER 25 MG PO TB24: 25.0000 mg | ORAL_TABLET | Freq: Every day | ORAL | Status: AC

## 2019-02-13 MED ORDER — FUROSEMIDE 10 MG/ML IJ SOLN
40.0000 mg | Freq: Once | INTRAMUSCULAR | Status: AC
  Administered 2019-02-13: 40 mg via INTRAVENOUS
  Filled 2019-02-13: qty 4

## 2019-02-13 MED ORDER — ASPIRIN EC 81 MG PO TBEC
81.0000 mg | DELAYED_RELEASE_TABLET | Freq: Every day | ORAL | Status: DC
  Administered 2019-02-13: 81 mg via ORAL
  Filled 2019-02-13: qty 1

## 2019-02-13 MED ORDER — METOPROLOL SUCCINATE ER 50 MG PO TB24
25.0000 mg | ORAL_TABLET | Freq: Every day | ORAL | Status: DC
  Administered 2019-02-13: 25 mg via ORAL
  Filled 2019-02-13: qty 1

## 2019-02-13 MED ORDER — NITROGLYCERIN 0.4 MG SL SUBL: 0.4000 mg | SUBLINGUAL_TABLET | SUBLINGUAL | 12 refills | Status: AC | PRN

## 2019-02-13 MED ORDER — CLOPIDOGREL BISULFATE 75 MG PO TABS
75.0000 mg | ORAL_TABLET | Freq: Every day | ORAL | Status: DC
  Administered 2019-02-13: 75 mg via ORAL
  Filled 2019-02-13: qty 1

## 2019-02-13 MED ORDER — SODIUM CHLORIDE 0.9 % IV SOLN: 1.0000 g | INTRAVENOUS | Status: AC

## 2019-02-13 MED ORDER — CLOPIDOGREL BISULFATE 75 MG PO TABS: 75.0000 mg | ORAL_TABLET | Freq: Every day | ORAL | Status: AC

## 2019-02-13 MED ORDER — METHYLPREDNISOLONE SODIUM SUCC 125 MG IJ SOLR: 60.0000 mg | Freq: Two times a day (BID) | INTRAMUSCULAR | 0 refills | Status: AC

## 2019-02-13 MED ORDER — DEXAMETHASONE SODIUM PHOSPHATE 10 MG/ML IJ SOLN
10.0000 mg | Freq: Once | INTRAMUSCULAR | Status: AC
  Administered 2019-02-13: 10 mg via INTRAVENOUS

## 2019-02-13 MED ORDER — TOCILIZUMAB 400 MG/20ML IV SOLN
800.0000 mg | Freq: Once | INTRAVENOUS | Status: AC
  Administered 2019-02-13: 800 mg via INTRAVENOUS
  Filled 2019-02-13: qty 20

## 2019-02-13 MED ORDER — AZITHROMYCIN 500 MG PO TABS
500.0000 mg | ORAL_TABLET | Freq: Every day | ORAL | Status: AC
  Administered 2019-02-13: 11:00:00 500 mg via ORAL
  Filled 2019-02-13: qty 1

## 2019-02-13 NOTE — H&P (Signed)
Clayton Sawyer is an 78 y.o. male.   Chief Complaint: Cough HPI: The patient with past medical history of stroke, hypertension and prostate cancer presents to the emergency department complaining of cough.  The patient reports that his cough is developed over several days.  His wife had been admitted to the hospital for treatment of COVID-19 and was discharged yesterday.  Today he has rapidly become short of breath.  The patient admits to subjective fevers and diaphoresis but denies chest pain, nausea or vomiting.  Chest x-ray showed patchy bilateral airspace opacities. Novel coronavirus assay was found to be positive.  Oxygen saturations were initially 84% on room air and did not improve until the patient was placed on nonrebreather mask.  Once stabilized emergency department staff, hospitalist service for admission.  Past Medical History:  Diagnosis Date  . Abdominal hernia without obstruction or gangrene 05/22/2014  . 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 March 2006 reports two drug-eluting stents placed in the right coronary artery (reported this procedure not currently available).  2. December 2006 cardiac catheterization:  EF 78%, 50% stenosis in the ramus intermedius and the right posterior diagonal artery.  Patient treated medically.  3. December 2006 cardiac MRI negative for ischemia.  EF normal.  4. April 2008 cardiac catheteri  . Calculus of kidney 09/26/2011   Overview:  1. Has bilateral retained kidney stones.  2. Status post urethral stenting.  Overview:  Overview:  1. Has bilateral retained kidney stones.  2. Status post urethral stenting.   . Chronotropic incompetence 07/22/2011   Overview:  Status post permanent pacemaker placement 06/12/2009 by Dr. Norm Salt.  Overview:  Overview:  Status post permanent pacemaker placement 06/12/2009 by Dr. Norm Salt.   . Diastolic dysfunction 16/05/9603   Overview:  1. June  2009 had progressive orthopnea and dyspnea on exertion and lower extremity edema.  2. July 2009 2D echocardiogram: EF >55%, mild concentric LVH, diastolic dysfunction Grade II, LAE 4.6 cm, mild tricuspid regurgitation.  Overview:  Overview:  1. June 2009 had progressive orthopnea and dyspnea on exertion and lower extremity edema.  2. July 2009 2D echocardiogram: EF >55%, mild   . Essential (primary) hypertension 07/22/2011  . 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   . Hyperlipidemia 07/22/2011   Overview:  1. December 2006 TC 142, TG 92, HDL 32, LDL 93.  2. November 2008 TC 181, TG 97, HDL 29, LDL 33.  3. January 2010 LDL 137, HDL 29.  4. Referral to Dr. Harrell Gave clinic. Patient started on Niacin, which he did not tolerate. Additionally started on Crestor alternating 2.5 mg every other day, which he also was unable to tolerate. Overview:  Overview:  1. December 2006 TC 142, TG 92, HDL 3  . Hypertension   . Malignant neoplasm of prostate ([place]) 03/04/2013  . MI, old   . Migraine without status migrainosus, not intractable 05/22/2014  . Osteoarthritis 09/26/2011   Overview:  Right total knee replacement.  Overview:  Overview:  Right total knee replacement.   . Presence of cardiac pacemaker 07/21/2011   Overview:  Dual-chamber pacemaker is a Biotronik via DR reference number [id number] serial number [id number].  Right atrial lead is a Comptroller number [id number] serial number [id number].  Right ventricular lead is Biotronik [employer]  S53 reference number is [id number] serial number [id number]. Device implanted 06/11/2009 Overview:  Overview:  Dual-chamber pacemaker is a Biotronik via DR reference n  . Varicella without complication 85/63/1497    Past Surgical History:  Procedure Laterality Date  . CARDIAC  CATHETERIZATION Right 04/12/2016   Procedure: Left Heart Cath and Coronary Angiography;  Surgeon: Dionisio David, MD;  Location: [facility];  Service: Cardiovascular;  Laterality: Right;  . knee replacements    . LEFT HEART CATH AND CORONARY ANGIOGRAPHY Left 12/19/2017   Procedure: LEFT HEART CATH AND CORONARY ANGIOGRAPHY;  Surgeon: Dionisio David, MD;  Location: [facility];  Service: Cardiovascular;  Laterality: Left;  . stents     cardiac stents    Family History  Problem Relation Age of Onset  . Prostate cancer Neg Hx   . Bladder Cancer Neg Hx   . Kidney cancer Neg Hx    Social History:  reports that he has never smoked. He has never used smokeless tobacco. He reports that he does not drink alcohol or use drugs.  Allergies:  Allergies  Allergen Reactions  . Lisinopril Cough  . Losartan Rash  . Statins Other (See Comments)    Body aches    Medications Prior to Admission  Medication Sig Dispense Refill  . aspirin EC 81 MG tablet Take 81 mg by mouth daily.    . clopidogrel (PLAVIX) 75 MG tablet Take 75 mg by mouth daily.     . Investigational - Study Medication Take 180 mg by mouth daily. Study name: Bempedoic Acid 180 mg Cholesterol Additional study details: Dr.Anderson    . metoprolol succinate (TOPROL-XL) 25 MG 24 hr tablet Take 25 mg by mouth daily.     . nitroGLYCERIN (NITROSTAT) 0.4 MG SL tablet     . pantoprazole (PROTONIX) 40 MG tablet Take 1 tablet (40 mg total) by mouth 2 (two) times daily. (Patient taking differently: Take 20 mg by mouth daily. ) 60 tablet 1    Results for orders placed or performed during the hospital encounter of 01/30/2019 (from the past 48 hour(s))  Lactic acid, plasma     Status: None   Collection Time: 02/22/2019  2:00 AM  Result Value Ref Range   Lactic Acid, Venous 1.9 0.5 - 1.9 mmol/L    Comment: Performed at [facility], [address] [place], [place] [address]  CBC WITH DIFFERENTIAL     Status: Abnormal    Collection Time: 02/19/2019  2:00 AM  Result Value Ref Range   WBC 9.9 4.0 - 10.5 K/uL   RBC 4.98 4.22 - 5.81 MIL/uL   Hemoglobin 15.0 13.0 - 17.0 g/dL   HCT 44.9 39.0 - 52.0 %   MCV 90.2 80.0 - 100.0 fL   MCH 30.1 26.0 - 34.0 pg   MCHC 33.4 30.0 - 36.0 g/dL   RDW 14.1 11.5 - 15.5 %   Platelets 208 150 - 400 K/uL   nRBC 0.0 0.0 - 0.2 %   Neutrophils Relative % 87 %   Neutro Abs 8.7 (H) 1.7 - 7.7 K/uL   Lymphocytes Relative 6 %   Lymphs Abs 0.6 (L) 0.7 - 4.0 K/uL   Monocytes Relative 6 %   Monocytes Absolute 0.6 0.1 - 1.0 K/uL   Eosinophils Relative 0 %   Eosinophils Absolute 0.0 0.0 - 0.5 K/uL   Basophils Relative 0 %   Basophils Absolute 0.0 0.0 - 0.1 K/uL   Immature Granulocytes 1 %  Abs Immature Granulocytes 0.07 0.00 - 0.07 K/uL    Comment: Performed at [facility], [address] [place], [place] [address]  Comprehensive metabolic panel     Status: Abnormal   Collection Time: 02/23/2019  2:00 AM  Result Value Ref Range   Sodium 135 135 - 145 mmol/L   Potassium 3.9 3.5 - 5.1 mmol/L   Chloride 99 98 - 111 mmol/L   CO2 22 22 - 32 mmol/L   Glucose, Bld 139 (H) 70 - 99 mg/dL   BUN 18 8 - 23 mg/dL   Creatinine, Ser 1.10 0.61 - 1.24 mg/dL   Calcium 8.0 (L) 8.9 - 10.3 mg/dL   Total Protein 6.8 6.5 - 8.1 g/dL   Albumin 3.4 (L) 3.5 - 5.0 g/dL   AST 32 15 - 41 U/L   ALT 16 0 - 44 U/L   Alkaline Phosphatase 69 38 - 126 U/L   Total Bilirubin 1.0 0.3 - 1.2 mg/dL   GFR calc non Af Amer >60 >60 mL/min   GFR calc Af Amer >60 >60 mL/min   Anion gap 14 5 - 15    Comment: Performed at [facility], [address] [place], [place] [address]  Fibrin derivatives D-Dimer     Status: Abnormal   Collection Time: 02/15/2019  2:00 AM  Result Value Ref Range   Fibrin derivatives D-dimer ([facility]) 1,244.59 (H) 0.00 - 499.00 ng/mL (FEU)    Comment: (NOTE) <> Exclusion of Venous Thromboembolism (VTE) - OUTPATIENT ONLY   (Emergency Department or [place])   0-499 ng/ml (FEU):  With a low to intermediate pretest probability                      for VTE this test result excludes the diagnosis                      of VTE.   >499 ng/ml (FEU) : VTE not excluded; additional work up for VTE is                      required. <> Testing on Inpatients and Evaluation of Disseminated Intravascular   Coagulation (DIC) Reference Range:   0-499 ng/ml (FEU) Performed at [facility], [address] [place], [place] [address]   Procalcitonin     Status: None   Collection Time: 02/07/2019  2:00 AM  Result Value Ref Range   Procalcitonin 0.35 ng/mL    Comment:        Interpretation: PCT (Procalcitonin) <= 0.5 ng/mL: Systemic infection (sepsis) is not likely. Local bacterial infection is possible. (NOTE)       Sepsis PCT Algorithm           Lower Respiratory Tract                                      Infection PCT Algorithm    ----------------------------     ----------------------------         PCT < 0.25 ng/mL                PCT < 0.10 ng/mL         Strongly encourage             Strongly discourage   discontinuation of antibiotics    initiation of antibiotics    ----------------------------     -----------------------------  PCT 0.25 - 0.50 ng/mL            PCT 0.10 - 0.25 ng/mL               OR       >80% decrease in PCT            Discourage initiation of                                            antibiotics      Encourage discontinuation           of antibiotics    ----------------------------     -----------------------------         PCT >= 0.50 ng/mL              PCT 0.26 - 0.50 ng/mL               AND        <80% decrease in PCT             Encourage initiation of                                             antibiotics       Encourage continuation           of antibiotics    ----------------------------     -----------------------------        PCT >= 0.50 ng/mL                  PCT > 0.50 ng/mL               AND         increase in PCT                   Strongly encourage                                      initiation of antibiotics    Strongly encourage escalation           of antibiotics                                     -----------------------------                                           PCT <= 0.25 ng/mL                                                 OR                                        > 80% decrease in PCT  Discontinue / Do not initiate                                             antibiotics Performed at [facility], [address] [place], [place] [address]   Lactate dehydrogenase     Status: Abnormal   Collection Time: 02/03/2019  2:00 AM  Result Value Ref Range   LDH 384 (H) 98 - 192 U/L    Comment: Performed at [facility], [address] [place], [place] [address]  Ferritin     Status: None   Collection Time: 02/24/2019  2:00 AM  Result Value Ref Range   Ferritin 146 24 - 336 ng/mL    Comment: Performed at [facility], [address] [place], [place] [address]  Triglycerides     Status: Abnormal   Collection Time: 03/01/2019  2:00 AM  Result Value Ref Range   Triglycerides 156 (H) <150 mg/dL    Comment: Performed at [facility], [address] [place], [place] [address]  Fibrinogen     Status: Abnormal   Collection Time: 02/16/2019  2:00 AM  Result Value Ref Range   Fibrinogen 748 (H) 210 - 475 mg/dL    Comment: Performed at [facility], [address] [place],  [address]  SARS Coronavirus 2 [facility] order, Performed in [place] hospital lab)     Status: Abnormal   Collection Time: 01/31/2019  2:00 AM   Specimen: Nasopharyngeal Swab  Result Value Ref Range   SARS Coronavirus 2 POSITIVE (A) NEGATIVE    Comment: CRITICAL RESULT CALLED TO, READ BACK BY AND VERIFIED WITH: AMY SMITH @333  03/01/2019 TTG  (NOTE) If result is NEGATIVE SARS-CoV-2 target nucleic acids are NOT DETECTED. The SARS-CoV-2 RNA  is generally detectable in upper and lower  respiratory specimens during the acute phase of infection. The lowest  concentration of SARS-CoV-2 viral copies this assay can detect is 250  copies / mL. A negative result does not preclude SARS-CoV-2 infection  and should not be used as the sole basis for treatment or other  patient management decisions.  A negative result may occur with  improper specimen collection / handling, submission of specimen other  than nasopharyngeal swab, presence of viral mutation(s) within the  areas targeted by this assay, and inadequate number of viral copies  (<250 copies / mL). A negative result must be combined with clinical  observations, patient history, and epidemiological information. If result is POSITIVE SARS-CoV-2 target nucleic acids are DETECTED.  The SARS-CoV-2 RNA is generally detectable in upper and lower  respiratory specimens during the acute phase of infection.  Positive  results are indicative of active infection with SARS-CoV-2.  Clinical  correlation with patient history and other diagnostic information is  necessary to determine patient infection status.  Positive results do  not rule out bacterial infection or co-infection with other viruses. If result is PRESUMPTIVE POSTIVE SARS-CoV-2 nucleic acids MAY BE PRESENT.   A presumptive positive result was obtained on the submitted specimen  and confirmed on repeat testing.  While 2019 novel coronavirus  (SARS-CoV-2) nucleic acids may be present in the submitted sample  additional confirmatory testing may be necessary for epidemiological  and / or clinical management purposes  to differentiate between  SARS-CoV-2 and other Sarbecovirus currently known to infect humans.  If clinically indicated additional testing  with an alternate test  methodology [phone])  is advised. The SARS-CoV-2 RNA is generally  detectable in upper and lower respiratory specimens during the acute  phase of  infection. The expected result is Negative. Fact Sheet for Patients:  StrictlyIdeas.no Fact Sheet for Healthcare Providers: BankingDealers.co.za This test is not yet approved or cleared by the [place] FDA and has been authorized for detection and/or diagnosis of SARS-CoV-2 by FDA under an Emergency Use Authorization (EUA).  This EUA will remain in effect (meaning this test can be used) for the duration of the COVID-19 declaration under Section 564(b)(1) of the Act, 21 U.S.C. section 360bbb-3(b)(1), unless the authorization is terminated or revoked sooner. Performed at [facility], [address] [place], [place] [address]   TSH     Status: None   Collection Time: 02/06/2019  2:00 AM  Result Value Ref Range   TSH 2.233 0.350 - 4.500 uIU/mL    Comment: Performed by a 3rd Generation assay with a functional sensitivity of <=0.01 uIU/mL. Performed at [facility], [address] [place], [place] [address]   MRSA PCR Screening     Status: None   Collection Time: 02/15/2019  4:51 AM   Specimen: Nasopharyngeal  Result Value Ref Range   MRSA by PCR NEGATIVE NEGATIVE    Comment:        The GeneXpert MRSA Assay (FDA approved for NASAL specimens only), is one component of a comprehensive MRSA colonization surveillance program. It is not intended to diagnose MRSA infection nor to guide or monitor treatment for MRSA infections. Performed at [facility], [address] [place], [place] [address]   Lactic acid, plasma     Status: None   Collection Time: 02/01/2019  5:24 AM  Result Value Ref Range   Lactic Acid, Venous 1.6 0.5 - 1.9 mmol/L    Comment: Performed at [facility], [address] [place],  [address]   Dg Chest Port 1 View  Result Date: 02/11/2019 CLINICAL DATA:  Cough diarrhea nausea and fever EXAM: PORTABLE CHEST 1 VIEW COMPARISON:  04/11/2016 FINDINGS: Right-sided pacing  device as before. Patchy bilateral somewhat peripheral airspace opacities. No pleural effusion. Normal heart size. No pneumothorax. IMPRESSION: Low lung volumes. Patchy bilateral ill-defined airspace opacities, concerning for pneumonia. Consider atypical or viral pneumonia in the appropriate clinical setting. Electronically Signed   By: Donavan Foil M.D.   On: 02/24/2019 02:31    Review of Systems  Constitutional: Positive for diaphoresis. Negative for chills and fever.  HENT: Negative for sore throat and tinnitus.   Eyes: Negative for blurred vision and redness.  Respiratory: Positive for cough and shortness of breath.   Cardiovascular: Negative for chest pain, palpitations, orthopnea and PND.  Gastrointestinal: Negative for abdominal pain, diarrhea, nausea and vomiting.  Genitourinary: Negative for dysuria, frequency and urgency.  Musculoskeletal: Negative for joint pain and myalgias.  Skin: Negative for rash.       No lesions  Neurological: Negative for speech change, focal weakness and weakness.  Endo/Heme/Allergies: Does not bruise/bleed easily.       No temperature intolerance  Psychiatric/Behavioral: Negative for depression and suicidal ideas.    Blood pressure 139/78, pulse 63, temperature 98 F (36.7 C), temperature source Axillary, resp. rate (!) 35, height 5\' 9"  (1.753 m), weight 106.1 kg, SpO2 93 %. Physical Exam  Vitals reviewed. Constitutional: He is oriented to person, place, and time. He appears well-developed and well-nourished. No distress.  HENT:  Head: Normocephalic and  atraumatic.  Mouth/Throat: Oropharynx is clear and moist.  Eyes: Pupils are equal, round, and reactive to light. Conjunctivae and EOM are normal. No scleral icterus.  Neck: Normal range of motion. Neck supple. No tracheal deviation present. No thyromegaly present.  Cardiovascular: Normal rate, regular rhythm and normal heart sounds. Exam reveals no gallop and no friction rub.  No murmur  heard. Respiratory: Effort normal and breath sounds normal. No respiratory distress. He has no wheezes.  GI: Soft. Bowel sounds are normal. He exhibits no distension.  Genitourinary:    Genitourinary Comments: Deferred   Musculoskeletal: Normal range of motion.        General: No edema.  Neurological: He is alert and oriented to person, place, and time. No cranial nerve deficit.  Skin: Skin is warm and dry. No rash noted. No erythema.  Psychiatric: He has a normal mood and affect. His behavior is normal. Judgment and thought content normal.     Assessment/Plan This is a 78 year old male admitted for respiratory failure. 1.  Respiratory failure: Acute with hypoxemia; secondary to COVID-19 infection.  The patient is currently on nonrebreather at 100% FiO2.  I have started azithromycin.  (Discontinue hydroxychloroquine).  Consult intensivist. 2.  Hypertension: Controlled; continue metoprolol 3.  Cerebrovascular disease: Continue aspirin and Plavix 4.  Obesity: BMI is 34.5; encourage healthy diet and exercise 5.  DVT prophylaxis: Lovenox 6.  GI prophylaxis: None The patient is a full code.  I have personally spent 45 minutes in critical care time with this patient.  Harrie Foreman, MD 02/05/2019, 6:49 AM

## 2019-02-13 NOTE — ED Provider Notes (Addendum)
[facility] Emergency Department Provider Note    First MD Initiated Contact with Patient 02/20/2019 0201     (approximate)  I have reviewed the triage vital signs and the nursing notes.   HISTORY  Chief Complaint Cough   HPI Clayton Sawyer is a 78 y.o. male with below list of previous medical conditions presents to the emergency department secondary to 1 week history of progressive dyspnea cough generalized weakness, nausea and diarrhea.  Of note the patient's wife is currently hospitalized at in [place] secondary to COVID-19 infection.  Patient states that he was tested on Friday but has not received results to date.  Patient's oxygen saturation on arrival 84% on room air tachypnea with respiratory rate of 24.  Of note patient states that he was prescribed Hydroxychloroquine this morning by his primary care provider        Past Medical History:  Diagnosis Date   Abdominal hernia without obstruction or gangrene 05/22/2014   Atherosclerotic heart disease of native coronary artery without angina pectoris 07/22/2011   Overview:  1. March 2006 reports two drug-eluting stents placed in the right coronary artery (reported this procedure not currently available).  2. December 2006 cardiac catheterization:  EF 78%, 50% stenosis in the ramus intermedius and the right posterior diagonal artery.  Patient treated medically.  3. December 2006 cardiac MRI negative for ischemia.  EF normal.  4. April 2008 cardiac catheteri   Calculus of kidney 09/26/2011   Overview:  1. Has bilateral retained kidney stones.  2. Status post urethral stenting.  Overview:  Overview:  1. Has bilateral retained kidney stones.  2. Status post urethral stenting.    Chronotropic incompetence 07/22/2011   Overview:  Status post permanent pacemaker placement 06/12/2009 by Dr. Norm Salt.  Overview:  Overview:  Status post permanent pacemaker placement 06/12/2009 by Dr. Norm Salt.     Diastolic dysfunction 41/32/4401   Overview:  1. June 2009 had progressive orthopnea and dyspnea on exertion and lower extremity edema.  2. July 2009 2D echocardiogram: EF >55%, mild concentric LVH, diastolic dysfunction Grade II, LAE 4.6 cm, mild tricuspid regurgitation.  Overview:  Overview:  1. June 2009 had progressive orthopnea and dyspnea on exertion and lower extremity edema.  2. 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    Hyperlipidemia 07/22/2011   Overview:  1. December 2006 TC 142, TG 92, HDL 32, LDL 93.  2. November 2008 TC 181, TG 97, HDL 29, LDL 33.  3. January 2010 LDL 137, HDL 29.  4. Referral to Dr. Harrell Gave clinic. Patient started on Niacin, which he did not tolerate. Additionally started on Crestor alternating 2.5 mg every other day, which he also was unable to tolerate. Overview:  Overview:  1. December 2006 TC 142, TG 92, HDL 3   Hypertension    Malignant neoplasm of prostate ([place]) 03/04/2013   MI, old    Migraine without status migrainosus, not intractable 05/22/2014   Osteoarthritis 09/26/2011   Overview:  Right total knee replacement.  Overview:  Overview:  Right total knee replacement.    Presence of cardiac pacemaker 07/21/2011   Overview:  Dual-chamber pacemaker is a Bi[employer]ronik via DR reference number [id number] serial number [id number].  Right atrial lead is  a [employer] number [id number] serial number [id number].  Right ventricular lead is Bi[employer]ronik Setrox [id number] reference number is [id number] serial number [id number]. Device implanted 06/11/2009 Overview:  Overview:  Dual-chamber pacemaker is a Bi[employer]ronik via DR reference n   Varicella without complication 44/09/4740    Patient  Active Problem List   Diagnosis Date Noted   Chest pain, rule out acute myocardial infarction 04/11/2016   Varicella without complication 59/56/3875   Migraine without status migrainosus, not intractable 05/22/2014   History of urinary stone 05/22/2014   Abdominal hernia without obstruction or gangrene 05/22/2014   Malignant neoplasm of prostate ([place]) 03/04/2013   Palpitations 09/26/2011   Osteoarthritis 09/26/2011   History of stroke 09/26/2011   Childhood asthma 09/26/2011   Calculus of kidney 09/26/2011   Hyperlipidemia 07/22/2011   Essential (primary) hypertension 64/33/2951   Diastolic dysfunction 88/41/6606   Chronotropic incompetence 07/22/2011   Atherosclerotic heart disease of native coronary artery without angina pectoris 07/22/2011   Presence of cardiac pacemaker 07/21/2011    Past Surgical History:  Procedure Laterality Date   CARDIAC CATHETERIZATION Right 04/12/2016   Procedure: Left Heart Cath and Coronary Angiography;  Surgeon: Dionisio David, MD;  Location: [place] [facility];  Service: Cardiovascular;  Laterality: Right;   knee replacements     LEFT HEART CATH AND CORONARY ANGIOGRAPHY Left 12/19/2017   Procedure: LEFT HEART CATH AND CORONARY ANGIOGRAPHY;  Surgeon: Dionisio David, MD;  Location: [place] [facility];  Service: Cardiovascular;  Laterality: Left;   stents     cardiac stents    Prior to Admission medications   Medication Sig Start Date End Date Taking? Authorizing Provider  aspirin EC 81 MG tablet Take 81 mg by mouth daily.    [provider]  clopidogrel (PLAVIX) 75 MG tablet Take 75 mg by mouth daily.     [provider]  Investigational - Study Medication Take 180 mg by mouth daily. Study name: Bempedoic Acid 180 mg Cholesterol Additional study details: Dr.Anderson    [provider]  metoprolol succinate (TOPROL-XL) 25 MG 24 hr tablet Take 25 mg by mouth daily.  02/07/13   [provider]    nitroGLYCERIN (NITROSTAT) 0.4 MG SL tablet  12/15/17   [provider]  pantoprazole (PROTONIX) 40 MG tablet Take 1 tablet (40 mg total) by mouth 2 (two) times daily. Patient taking differently: Take 20 mg by mouth daily.  04/12/16   Henreitta Leber, MD    Allergies Lisinopril, Losartan, and Statins  Family History  Problem Relation Age of Onset   Prostate cancer Neg Hx    Bladder Cancer Neg Hx    Kidney cancer Neg Hx     Social History Social History   Tobacco Use   Smoking status: Never Smoker   Smokeless tobacco: Never Used  Substance Use Topics   Alcohol use: No   Drug use: No    Review of Systems Constitutional: Positive for fever/chills Eyes: No visual changes. ENT: No sore throat. Cardiovascular: Denies chest pain. Respiratory: Positive for cough and dyspnea Gastrointestinal: No abdominal pain.  Positive for nausea and diarrhea genitourinary: Negative for dysuria. Musculoskeletal: Negative for neck pain.  Negative for back pain. Integumentary: Negative for rash. Neurological: Negative for headaches, focal weakness or numbness.   ____________________________________________   PHYSICAL EXAM:  VITAL SIGNS: ED Triage Vitals [02/26/2019 0141]  Enc Vitals Group     BP 111/86     Pulse Rate 71  Resp (!) 24     Temp 99.2 F (37.3 C)     Temp Source Oral     SpO2 (!) 84 %     Weight 100 kg (220 lb 7.4 oz)     Height 1.702 m (5\' 7" )     Head Circumference      Peak Flow      Pain Score 0     Pain Loc      Pain Edu?      Excl. in [place]?     Constitutional: Alert and oriented.  Apparent respiratory distress  eyes: Conjunctivae are normal.  Mouth/Throat: Mucous membranes are moist. Oropharynx non-erythematous. Neck: No stridor. Cardiovascular: Normal rate, regular rhythm. Good peripheral circulation. Grossly normal heart sounds. Respiratory: Tachypnea, diffuse rhonchi, positive accessory respiratory muscle use Gastrointestinal: Soft and  nontender. No distention.  Musculoskeletal: No lower extremity tenderness nor edema. No gross deformities of extremities. Neurologic:  Normal speech and language. No gross focal neurologic deficits are appreciated.  Skin:  Skin is warm, dry and intact. No rash noted. Psychiatric: Mood and affect are normal. Speech and behavior are normal.  ____________________________________________   LABS (all labs ordered are listed, but only abnormal results are displayed)  Labs Reviewed  CBC WITH DIFFERENTIAL/PLATELET - Abnormal; Notable for the following components:      Result Value   Neutro Abs 8.7 (*)    Lymphs Abs 0.6 (*)    All other components within normal limits  TRIGLYCERIDES - Abnormal; Notable for the following components:   Triglycerides 156 (*)    All other components within normal limits  FIBRINOGEN - Abnormal; Notable for the following components:   Fibrinogen 748 (*)    All other components within normal limits  CULTURE, BLOOD (ROUTINE X 2)  CULTURE, BLOOD (ROUTINE X 2)  SARS CORONAVIRUS 2 (HOSPITAL ORDER, [facility])  LACTIC ACID, PLASMA  LACTIC ACID, PLASMA  COMPREHENSIVE METABOLIC PANEL  FIBRIN DERIVATIVES D-DIMER ([facility] ONLY)  PROCALCITONIN  LACTATE DEHYDROGENASE  FERRITIN  C-REACTIVE PROTEIN   ____________________________________________  EKG  ED ECG REPORT I, Wheeler N Gennett Garcia, the attending physician, personally viewed and interpreted this ECG.   Date: 02/07/2019  EKG Time: 2:48 AM  Rate: 84  Rhythm: Atrial paced complexes  Axis: Normal  Intervals: Normal  ST&T Change: None ____________________________________________  RADIOLOGY I, [place] Mariella Blackwelder, personally viewed and evaluated these images (plain radiographs) as part of my medical decision making, as well as reviewing the written report by the radiologist.  ED MD interpretation: Patchy bilateral ill-defined airspace opacities concerning for pneumonia per  radiologist Official radiology report(s): Dg Chest Port 1 View  Result Date: 02/23/2019 CLINICAL DATA:  Cough diarrhea nausea and fever EXAM: PORTABLE CHEST 1 VIEW COMPARISON:  04/11/2016 FINDINGS: Right-sided pacing device as before. Patchy bilateral somewhat peripheral airspace opacities. No pleural effusion. Normal heart size. No pneumothorax. IMPRESSION: Low lung volumes. Patchy bilateral ill-defined airspace opacities, concerning for pneumonia. Consider atypical or viral pneumonia in the appropriate clinical setting. Electronically Signed   By: Donavan Foil M.D.   On: 02/04/2019 02:31    ____________________________________________   PROCEDURES     .Critical Care Performed by: Gregor Hams, MD Authorized by: Gregor Hams, MD   Critical care provider statement:    Critical care time (minutes):  30   Critical care time was exclusive of:  Separately billable procedures and treating other patients (COVID-19 infection)   Critical care was necessary to treat or prevent imminent  or life-threatening deterioration of the following conditions:  Respiratory failure   Critical care was time spent personally by me on the following activities:  Development of treatment plan with patient or surrogate, discussions with consultants, evaluation of patient's response to treatment, examination of patient, obtaining history from patient or surrogate, ordering and performing treatments and interventions, ordering and review of laboratory studies, ordering and review of radiographic studies, pulse oximetry, re-evaluation of patient's condition and review of old charts     ____________________________________________   [place] / MDM / [place] / ED COURSE  As part of my medical decision making, I reviewed the following data within the electronic MEDICAL RECORD NUMBER  78 year old male presenting with above-stated history and physical exam concerning for COVID-19 infection.   Given patient's increased work of breathing and hypoxia nonrebreather applied.  Patient currently satting 92% on nonrebreather with decrease in respiratory rate.  Patient also given Decadron 10 mg IV.  Patient discussed with Dr. Marcille Blanco for hospital admission further evaluation and management ____________________________________________  FINAL CLINICAL IMPRESSION(S) / ED DIAGNOSES  Final diagnoses:  COVID-19 virus infection     MEDICATIONS GIVEN DURING THIS VISIT:  Medications  dexamethasone (DECADRON) injection 10 mg (10 mg Intravenous Given 02/25/2019 0219)  dexamethasone (DECADRON) injection 10 mg (10 mg Intravenous Given 02/26/2019 0230)     ED Discharge Orders    None      *Please note:  Clayton Sawyer was evaluated in Emergency Department on 02/14/2019 for the symptoms described in the history of present illness. He was evaluated in the context of the global COVID-19 pandemic, which necessitated consideration that the patient might be at risk for infection with the SARS-CoV-2 virus that causes COVID-19. Institutional protocols and algorithms that pertain to the evaluation of patients at risk for COVID-19 are in a state of rapid change based on information released by regulatory bodies including the CDC and federal and state organizations. These policies and algorithms were followed during the patient's care in the ED.  Some ED evaluations and interventions may be delayed as a result of limited staffing during the pandemic.*  Note:  This document was prepared using Dragon voice recognition software and may include unintentional dictation errors.   Gregor Hams, MD 02/09/2019 0404    Gregor Hams, MD 02/24/19 [phone]

## 2019-02-13 NOTE — Progress Notes (Signed)
Patient arrived via  [facility] on [facility].  Placed on [facility] at [place] 100% with NRB for sp02 of 87% .

## 2019-02-13 NOTE — Discharge Summary (Signed)
Physician Discharge Summary  Patient ID: Clayton Sawyer MRN: [id number] DOB/AGE: November 24, 1940 78 y.o.  Admit date: 03/01/2019 Discharge date: 02/08/2019  Admission Diagnoses:COVID 19 Pneumonia  Discharge Diagnoses:  Active Problems:   Respiratory distress COVID 19 Pneumonia    Hospital Course:  Acute Hypoxic Respiratory Failure secondary to COVID-19 infection & Pneumonia -Supplemental O2 as needed to maintain O2 sats>92% -Follow intermittent CXR & ABG as needed -Trend LFT's, CBC, BMP, Troponin, CRP, Ferritin, Fibrinogen, D-dimer, Procalcitonin -CXR with bilateral opacities, will place on empiric Azithromycin & Rocephin for now -Blood cultures, strep pneumo & Legionella urinary antigens pending -IV Solu-Medrol daily -Will start on Remdesivir given high FiO2 requirements and hypoxia; counseled pt on Remdesivir's mechanism of action, side effects of elevated liver enzymes & N/V; pt is in agreement to start Remdesivir; consulted with pharmacy for dosing -Encouraged Prone Positioning >16 hours per day in increments of 2-3 hrs at a time as able to tolerate -Maintain Euvovolemia as able    Treatments Remdisivir Solumedrol Azithromycin Rocephin Lasix   Discharge Exam: Blood pressure (!) 145/90, pulse 67, temperature 98 F (36.7 C), temperature source Axillary, resp. rate (!) 23, height 5\' 9"  (1.753 m), weight 106.1 kg, SpO2 91 %. Severe hypoxia   Disposition: Transfer to [facility]     Signed: Naevia Unterreiner 02/20/2019, 7:55 AM

## 2019-02-13 NOTE — ED Triage Notes (Signed)
Pt to the er for cough, diarrhea, nausea, fever, and overall feeling bad. Wife was dx with covid and hospitalized in [place]. Pt is obviously SOB and sats are 84%

## 2019-02-13 NOTE — Progress Notes (Signed)
Patient is admitted to intensive care unit for COVID-19 pneumonia with acute hypoxic respiratory failure.  Patient is on room December, Solu-Medrol, Rocephin azithromycin and Lasix and the process of getting transferred to [facility]

## 2019-02-13 NOTE — ED Notes (Signed)
Pt 91% on 15L. MD Owens Shark aware, no new orders at this time.

## 2019-02-13 NOTE — Progress Notes (Signed)
Report given to [facility] [employer].  Pt to [facility] 205 Bed 3.

## 2019-02-13 NOTE — Progress Notes (Signed)
Pt transferred at 2105 via [facility]

## 2019-02-13 NOTE — H&P (Signed)
Patient Demographics:    Clayton Sawyer, is a 78 y.o. male  MRN: [id number]   DOB - 25-Oct-1940  Admit Date - 02/28/2019  Outpatient Primary MD for the patient is Jodi Marble, MD   Assessment & Plan:    Principal Problem:   Acute respiratory disease due to COVID-19 virus Active Problems:   Bil Pneumonia due to COVID-19 virus   History of stroke   Essential (primary) hypertension   Atherosclerotic heart disease of native coronary artery without angina pectoris/PCI stents 09/2004, 03/2009 and 10/2011   Presence of cardiac pacemaker    1)Acute hypoxic respiratory failure secondary to COVID-19 infection/pneumonia--- The treatment plan and use of medications  for treatment of COVID-19 infection and possible side effects were discussed with patient/family, they were clearly explained that there is No proven definitive treatment for COVID-19 infection, any medications used here are based on published clinical articles/anecdotal data which at times and not yet peer-reviewed or randomized control trials. Complete risks and long-term side effects are unknown, however in the best clinical judgment they seem to be of some clinical benefit rather than medical risks. --potential side effects of Remdesivir including, but not limited to allergic reaction, nausea, vomiting, elevated LFTs discussed with patient also discussed potential steroid side effect including elevated blood sugars, elevated blood pressure, psychosis/anxiety,  insomnia --Patient/Family verbalizes understanding and agrees to treatment protocols  Patient/family agree with the treatment plan and want to receive the given medications including steroids and Remdesevir --Patient is positive for COVID-19 infection, chest x-ray with findings consistent with pneumonia,  patient is hypoxic and requiring continuous supplemental oxygen---patient meets criteria for initiation of Remdesivir AND steroids per protocol  -patient received remdesivir and steroids at [facility] on 02/06/2019- we will  continue same per protocol --Check and trend fibrinogen, CRP, ferritin, LDH, pro calcitonin, CBC, BMP, d-dimer and LFTs --Supplemental oxygen to keep O2 sats above 93%--currently requiring 15 L of oxygen/high flow oxygen-- in addition to nonrebreather bag -Follow serial chest x-rays and ABGs as indicated --- Encourage prone positioning for More than 16 hours/day in increments of 2 to 3 hours at a time if able to tolerate --Attempt to maintain euvolemic state --Zinc and vitamin C as ordered --Albuterol inhaler as needed   2)H/o CVA--- stable, patient is apparently intolerant to statins, continue Plavix 75 mg daily  3)HTN--stable, continue Toprol-XL 25 mg daily  4)H/o CAD--- prior PCI/stent on 09/2004, 03/2009 on 10/2011-double, no ACS type symptoms, continue Toprol-XL 25 mg daily and Plavix 35 mg daily, patient is apparently intolerant to statins  5)Social/Ethics----patient is a full code, his wife was just discharged from [facility] hospital after treatment for COVID-19 infection on 02/12/2019  6) history of chronotropic incompetence/status post pacemaker placement--- stable at this time  With History of - Reviewed by me  Past Medical History:  Diagnosis Date   Abdominal hernia without obstruction or gangrene 05/22/2014   Atherosclerotic heart disease of native  coronary artery without angina pectoris 07/22/2011   Overview:  1. March 2006 reports two drug-eluting stents placed in the right coronary artery (reported this procedure not currently available).  2. December 2006 cardiac catheterization:  EF 78%, 50% stenosis in the ramus intermedius and the right posterior diagonal artery.  Patient treated medically.  3. December 2006 cardiac MRI negative for  ischemia.  EF normal.  4. April 2008 cardiac catheteri   Calculus of kidney 09/26/2011   Overview:  1. Has bilateral retained kidney stones.  2. Status post urethral stenting.  Overview:  Overview:  1. Has bilateral retained kidney stones.  2. Status post urethral stenting.    Chronotropic incompetence 07/22/2011   Overview:  Status post permanent pacemaker placement 06/12/2009 by Dr. Norm Salt.  Overview:  Overview:  Status post permanent pacemaker placement 06/12/2009 by Dr. Norm Salt.    Diastolic dysfunction 79/39/0300   Overview:  1. June 2009 had progressive orthopnea and dyspnea on exertion and lower extremity edema.  2. July 2009 2D echocardiogram: EF >55%, mild concentric LVH, diastolic dysfunction Grade II, LAE 4.6 cm, mild tricuspid regurgitation.  Overview:  Overview:  1. June 2009 had progressive orthopnea and dyspnea on exertion and lower extremity edema.  2. 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    Hyperlipidemia 07/22/2011   Overview:  1. December 2006 TC 142, TG 92, HDL 32, LDL 93.  2. November 2008 TC 181, TG 97, HDL 29, LDL 33.  3. January 2010 LDL 137, HDL 29.  4. Referral to Dr. Harrell Gave clinic. Patient started on Niacin, which he did not tolerate. Additionally started on Crestor alternating 2.5 mg every other day, which he also was unable to tolerate. Overview:  Overview:  1. December 2006 TC 142, TG 92, HDL 3   Hypertension    Malignant neoplasm of prostate ([place]) 03/04/2013   MI, old    Migraine without status migrainosus, not intractable 05/22/2014   Osteoarthritis 09/26/2011   Overview:  Right total knee replacement.  Overview:  Overview:  Right total knee  replacement.    Presence of cardiac pacemaker 07/21/2011   Overview:  Dual-chamber pacemaker is a Biotronik via DR reference number [id number] serial number [id number].  Right atrial lead is a Comptroller number [id number] serial number [id number].  Right ventricular lead is Biotronik Setrox [id number] reference number is [id number] serial number [id number]. Device implanted 06/11/2009 Overview:  Overview:  Dual-chamber pacemaker is a Biotronik via DR reference n   Varicella without complication 37/34/2876      Past Surgical History:  Procedure Laterality Date   CARDIAC CATHETERIZATION Right 04/12/2016   Procedure: Left Heart Cath and Coronary Angiography;  Surgeon: Dionisio David, MD;  Location: [place] [facility];  Service: Cardiovascular;  Laterality: Right;   knee replacements     LEFT HEART CATH AND CORONARY ANGIOGRAPHY Left 12/19/2017   Procedure: LEFT HEART CATH AND CORONARY ANGIOGRAPHY;  Surgeon: Dionisio David, MD;  Location: [place] [facility];  Service: Cardiovascular;  Laterality: Left;   stents     cardiac stents      No chief complaint on file.     HPI:    Clayton Sawyer  is a 78 y.o. male with possible History relevant  for hypertension, history of prostate cancer, history of prior stroke Who presented on 07/29/202022 [facility] with complaints of cough, diarrhea, nausea, fever, and overall feeling unwell for over 1 week.  --His dyspnea got suddenly worse over the last 24 hours and she came to the ED His wife was dx with covid and hospitalized at [facility] and discharged on 02/12/2019.  - Patient was found to be very SOB and sats were 84% on room air, hypoxia persisted on nonrebreather bag, high flow oxygen was added -- He was tested for COVID 19 on 02/08/2019 and was awaiting results --His PCP apparently prescribed hydroxychloroquine for him on 02/12/2019  Patient was seen and evaluated by me on 02/02/2019  -COVID-19 testing was positive on  02/10/2019 at [facility] --In ED at [facility] on 02/09/2019 chest x-ray showed bilateral airspace opacities suggestive of viral/atypical pneumonia most likely consistent with COVID 19 infection/pneumonia---patient was found to be persistently hypoxic despite nonrebreather bag --He was started on steroids on remdesivir  Labs from [facility] reviewed--creatinine is 1.0, CBC without leukopenia   Review of systems:    In addition to the HPI above,   A full Review of  Systems was done, all other systems reviewed are negative except as noted above in HPI , .    Social History:  Reviewed by me    Social History   Tobacco Use   Smoking status: Never Smoker   Smokeless tobacco: Never Used  Substance Use Topics   Alcohol use: No       Family History :  Reviewed by me    Family History  Problem Relation Age of Onset   Prostate cancer Neg Hx    Bladder Cancer Neg Hx    Kidney cancer Neg Hx     Home Medications:   Prior to Admission medications   Medication Sig Start Date End Date Taking? Authorizing Provider  azithromycin (ZITHROMAX) 500 MG tablet Take 1 tablet (500 mg total) by mouth daily. 03/01/2019   Flora Lipps, MD  cefTRIAXone 1 g in sodium chloride 0.9 % 100 mL Inject 1 g into the vein daily. 02/14/19   Flora Lipps, MD  clopidogrel (PLAVIX) 75 MG tablet Take 1 tablet (75 mg total) by mouth daily. 01/30/2019   Flora Lipps, MD  methylPREDNISolone sodium succinate (SOLU-MEDROL) 125 mg/2 mL injection Inject 0.96 mLs (60 mg total) into the vein every 12 (twelve) hours. 02/08/2019   Awilda Bill, NP  metoprolol succinate (TOPROL-XL) 25 MG 24 hr tablet Take 1 tablet (25 mg total) by mouth daily. Take with or immediately following a meal. 02/18/2019   Flora Lipps, MD  nitroGLYCERIN (NITROSTAT) 0.4 MG SL tablet Place 1 tablet (0.4 mg total) under the tongue every 5 (five) minutes as needed for chest pain. 02/02/2019   Awilda Bill, NP  pantoprazole (PROTONIX) 20 MG tablet Take 1 tablet (20 mg total) by mouth daily. 02/13/19   Flora Lipps, MD  vitamin C (VITAMIN C) 500 MG tablet Take 1 tablet (500 mg total) by mouth daily. 02/14/19   Awilda Bill, NP  zinc sulfate 220 (50 Zn) MG capsule Take 1 capsule (220 mg total) by mouth daily. 02/14/19   Awilda Bill, NP     Allergies:     Allergies  Allergen Reactions   Lisinopril Cough   Losartan Rash   Statins Other (See Comments)    Body aches     Physical  Exam:   Vitals  Blood pressure (!) 147/79, pulse 62, temperature (!) 97.5 F (36.4 C), temperature source Oral, resp. rate (!) 33, height 5\' 9"  (1.753 m), weight 105.7 kg, SpO2 (!) 89 %.  Physical Examination: General appearance - alert, patient is in significant respiratory distress, only able to talk in phrases mental status - alert, oriented to person, place, and time,  Nose- NRB and High Flow Oxygen Eyes - sclera anicteric Neck - supple, no JVD elevation , Chest -patient is tachypneic, diminished breath sounds, no wheezing Heart - S1 and S2 normal, regular, Rt subclavian area with pacer in situ Abdomen - soft, nontender, nondistended, no masses or organomegaly Neurological - screening mental status exam normal, neck supple without rigidity, cranial nerves II through XII intact, DTR's normal and symmetric Extremities - no pedal edema noted, intact peripheral pulses  Skin - warm, dry    Data Review:    CBC Recent Labs  Lab 02/23/2019 0200  WBC 9.9  HGB 15.0  HCT 44.9  PLT 208  MCV 90.2  MCH 30.1  MCHC 33.4  RDW 14.1  LYMPHSABS 0.6*  MONOABS 0.6  EOSABS 0.0  BASOSABS 0.0   ------------------------------------------------------------------------------------------------------------------  Chemistries  Recent Labs  Lab 02/24/2019 0200  NA 135  K 3.9  CL 99  CO2 22  GLUCOSE 139*  BUN 18  CREATININE 1.10  CALCIUM 8.0*  AST 32  ALT 16  ALKPHOS 69  BILITOT 1.0    ------------------------------------------------------------------------------------------------------------------ estimated creatinine clearance is 66.3 mL/min (by C-G formula based on SCr of 1.1 mg/dL). ------------------------------------------------------------------------------------------------------------------ Recent Labs    02/06/2019 0200  TSH 2.233     Coagulation profile No results for input(s): INR, PROTIME in the last 168 hours. ------------------------------------------------------------------------------------------------------------------- No results for input(s): DDIMER in the last 72 hours. -------------------------------------------------------------------------------------------------------------------  Cardiac Enzymes No results for input(s): CKMB, TROPONINI, MYOGLOBIN in the last 168 hours.  Invalid input(s): CK ------------------------------------------------------------------------------------------------------------------ No results found for: BNP   ---------------------------------------------------------------------------------------------------------------  Urinalysis    Component Value Date/Time   COLORURINE YELLOW (A) 12/07/2017 0008   APPEARANCEUR CLEAR (A) 12/07/2017 0008   APPEARANCEUR Clear 04/18/2013 2216   LABSPEC 1.021 12/07/2017 0008   LABSPEC 1.025 04/18/2013 2216   PHURINE 5.0 12/07/2017 0008   GLUCOSEU NEGATIVE 12/07/2017 0008   GLUCOSEU Negative 04/18/2013 2216   HGBUR NEGATIVE 12/07/2017 0008   BILIRUBINUR NEGATIVE 12/07/2017 0008   BILIRUBINUR Negative 04/18/2013 2216   KETONESUR NEGATIVE 12/07/2017 0008   PROTEINUR NEGATIVE 12/07/2017 0008   NITRITE NEGATIVE 12/07/2017 0008   LEUKOCYTESUR NEGATIVE 12/07/2017 0008   LEUKOCYTESUR Negative 04/18/2013 2216    ----------------------------------------------------------------------------------------------------------------   Imaging Results:    Dg Chest Port 1 View  Result  Date: 02/03/2019 CLINICAL DATA:  Cough diarrhea nausea and fever EXAM: PORTABLE CHEST 1 VIEW COMPARISON:  04/11/2016 FINDINGS: Right-sided pacing device as before. Patchy bilateral somewhat peripheral airspace opacities. No pleural effusion. Normal heart size. No pneumothorax. IMPRESSION: Low lung volumes. Patchy bilateral ill-defined airspace opacities, concerning for pneumonia. Consider atypical or viral pneumonia in the appropriate clinical setting. Electronically Signed   By: Donavan Foil M.D.   On: 02/04/2019 02:31    Radiological Exams on Admission: Dg Chest Port 1 View  Result Date: 01/30/2019 CLINICAL DATA:  Cough diarrhea nausea and fever EXAM: PORTABLE CHEST 1 VIEW COMPARISON:  04/11/2016 FINDINGS: Right-sided pacing device as before. Patchy bilateral somewhat peripheral airspace opacities. No pleural effusion. Normal heart size. No pneumothorax. IMPRESSION: Low lung volumes. Patchy bilateral ill-defined airspace opacities, concerning for pneumonia. Consider  atypical or viral pneumonia in the appropriate clinical setting. Electronically Signed   By: Donavan Foil M.D.   On: 02/06/2019 02:31    DVT Prophylaxis -SCD/Heparin AM Labs Ordered, also please review Full Orders  Family Communication: Admission, patients condition and plan of care including tests being ordered have been discussed with the patient who indicate understanding and agree with the plan   Code Status - Full Code  Likely DC to  TBD  Condition   gaurded  Roxan Hockey M.D on 02/14/2019 at 2:09 AM Go to [email or web address] -  for contact info  [facility] - Office  [phone]

## 2019-02-13 NOTE — Progress Notes (Signed)
Notified wife of pt's transfer and bed.

## 2019-02-13 NOTE — ED Notes (Signed)
Patient placed on nonrebreather 15L/min. MD notified.

## 2019-02-13 NOTE — Consult Note (Signed)
Name: Clayton Sawyer MRN: [id number] DOB: [birth year]    ADMISSION DATE:  02/08/2019 CONSULTATION DATE:  02/26/2019  REFERRING MD :  Dr. Marcille Blanco  CHIEF COMPLAINT:  Cough  BRIEF PATIENT DESCRIPTION:  Clayton Sawyer is a 78 y.o. Male admitted with Acute Hypoxic Respiratory Failure in setting of COVID-19 infection.  CXR with bilateral infiltrates concerning for questionable secondary pneumonia.  SIGNIFICANT EVENTS  7/15>>Admission to Stepdown 7/15>> COVID-19 PCR Positive  STUDIES:  N/A  CULTURES: SARS-CoV-2 PCR 7/15>> positive Blood x2 7/15>> Strep pneumo urinary antigen 7/15>> Legionella urinary antigen 7/15>>  ANTIBIOTICS: Azithromycin 7/15>> Rocephin 7/15>> Remdesivir 7/15>>  HISTORY OF PRESENT ILLNESS:   Clayton Sawyer is a 78 year old male with a past medical history as listed below who who presents to [facility] ED on 02/24/2019 with complaints of progressive tachypnea, cough, generalized weakness, nausea, diarrhea, and fever.  Of note the patient's wife is currently hospitalized at [facility] secondary to COVID-19 infection.  He reports that he was tested on Friday but has not yet received his results.  Upon presentation to the ED he was noted to be hypoxic with O2 sats 84% on room air and tachypnea with respiratory rate of 24.  Given his work of breathing and hypoxia he was placed on nonrebreather and given 10 mg Decadron IV. Initial work-up in the ED revealed WBC 9.9, lymphocytopenia, glucose 139, LDH 384, triglycerides 156, ferritin 146, fibrinogen 748, fibrin derivatives 1244, lactic acid 1.9, procalcitonin 0.35.  His SARS-CoV-2 PCR is positive in the ED. Chest x-ray reveals patchy bilateral opacities concerning for pneumonia.  Currently [facility] has no bed availability, therefore he is admitted to stepdown unit for further work-up and treatment of acute hypoxic respiratory failure in the setting of COVID-19 infection and questionable secondary pneumonia.  PCCM is consulted for  further management  PAST MEDICAL HISTORY :   has a past medical history of Abdominal hernia without obstruction or gangrene (05/22/2014), Atherosclerotic heart disease of native coronary artery without angina pectoris (07/22/2011), Calculus of kidney (09/26/2011), Chronotropic incompetence (95/63/8756), Diastolic dysfunction (43/32/9518), Essential (primary) hypertension (07/22/2011), History of stroke (09/26/2011), Hyperlipidemia (07/22/2011), Hypertension, Malignant neoplasm of prostate ([place]) (03/04/2013), MI, old, Migraine without status migrainosus, not intractable (05/22/2014), Osteoarthritis (09/26/2011), Presence of cardiac pacemaker (07/21/2011), and Varicella without complication (84/16/6063).  has a past surgical history that includes stents; knee replacements; Cardiac catheterization (Right, 04/12/2016); and LEFT HEART CATH AND CORONARY ANGIOGRAPHY (Left, 12/19/2017). Prior to Admission medications   Medication Sig Start Date End Date Taking? Authorizing Provider  aspirin EC 81 MG tablet Take 81 mg by mouth daily.    [provider]  clopidogrel (PLAVIX) 75 MG tablet Take 75 mg by mouth daily.     [provider]  Investigational - Study Medication Take 180 mg by mouth daily. Study name: Bempedoic Acid 180 mg Cholesterol Additional study details: Dr.Anderson    [provider]  metoprolol succinate (TOPROL-XL) 25 MG 24 hr tablet Take 25 mg by mouth daily.  02/07/13   [provider]  nitroGLYCERIN (NITROSTAT) 0.4 MG SL tablet  12/15/17   [provider]  pantoprazole (PROTONIX) 40 MG tablet Take 1 tablet (40 mg total) by mouth 2 (two) times daily. Patient taking differently: Take 20 mg by mouth daily.  04/12/16   Henreitta Leber, MD   Allergies  Allergen Reactions   Lisinopril Cough   Losartan Rash   Statins Other (See Comments)    Body aches    FAMILY HISTORY:  family history is  not on file. SOCIAL HISTORY:  reports that he has never  smoked. He has never used smokeless tobacco. He reports that he does not drink alcohol or use drugs.   COVID-19 DISASTER DECLARATION:  FULL CONTACT PHYSICAL EXAMINATION WAS NOT POSSIBLE DUE TO TREATMENT OF COVID-19 AND  CONSERVATION OF PERSONAL PROTECTIVE EQUIPMENT, LIMITED EXAM FINDINGS INCLUDE-  Patient assessed or the symptoms described in the history of present illness.  In the context of the Global COVID-19 pandemic, which necessitated consideration that the patient might be at risk for infection with the SARS-CoV-2 virus that causes COVID-19, Institutional protocols and algorithms that pertain to the evaluation of patients at risk for COVID-19 are in a state of rapid change based on information released by regulatory bodies including the CDC and federal and state organizations. These policies and algorithms were followed during the patient's care while in hospital.  REVIEW OF SYSTEMS:  Positives in BOLD Constitutional: Negative for +fever, chills, weight loss, +malaise/fatigue and diaphoresis.  HENT: Negative for hearing loss, ear pain, nosebleeds, congestion, sore throat, neck pain, tinnitus and ear discharge.   Eyes: Negative for blurred vision, double vision, photophobia, pain, discharge and redness.  Respiratory: Negative for +cough, hemoptysis, sputum production, shortness of breath, wheezing and stridor.   Cardiovascular: Negative for chest pain, palpitations, orthopnea, claudication, leg swelling and PND.  Gastrointestinal: Negative for heartburn, nausea, vomiting, abdominal pain, diarrhea, constipation, blood in stool and melena.  Genitourinary: Negative for dysuria, urgency, frequency, hematuria and flank pain.  Musculoskeletal: Negative for myalgias, back pain, joint pain and falls.  Skin: Negative for itching and rash.  Neurological: Negative for dizziness, tingling, tremors, sensory change, speech change, focal weakness, seizures, loss of consciousness, weakness and  headaches.  Endo/Heme/Allergies: Negative for environmental allergies and polydipsia. Does not bruise/bleed easily.  SUBJECTIVE:  Pt reports malaise and dry cough Denies chest pain, SOB, wheezing, abdominal pain, N/V, Diarrhea On Non-Rebreather mask  VITAL SIGNS: Temp:  [98 F (36.7 C)-99.2 F (37.3 C)] 98 F (36.7 C) (07/15 0401) Pulse Rate:  [61-71] 65 (07/15 0401) Resp:  [24-41] 28 (07/15 0401) BP: (111-143)/(70-86) 119/78 (07/15 0401) SpO2:  [84 %-93 %] 93 % (07/15 0401) Weight:  [100 kg-106.1 kg] 106.1 kg (07/15 0409)  PHYSICAL EXAMINATION: General: Acutely ill-appearing male, sitting in bed, on nonrebreather mask, no acute distress Neuro: Awake, alert and oriented x4, follows commands, no focal deficits, speech clear, pupils PERRLA HEENT: Atraumatic, normocephalic, neck supple, no JVD Cardiovascular: Regular rate and rhythm, unable to auscultate due to PAPR mask, 2+ pulses throughout Lungs: Unable to auscultate due to PAPR mask, mild tachypnea, even, no accessory muscle use Abdomen: Obese, soft, nontender, nondistended, no guarding or rebound tenderness Musculoskeletal: Normal bulk and tone, no deformities, no edema Skin: Warm and dry, no obvious rashes, lesions, or ulcerations  Recent Labs  Lab 02/01/2019 0200  NA 135  K 3.9  CL 99  CO2 22  BUN 18  CREATININE 1.10  GLUCOSE 139*   Recent Labs  Lab 02/28/2019 0200  HGB 15.0  HCT 44.9  WBC 9.9  PLT 208   Dg Chest Port 1 View  Result Date: 02/09/2019 CLINICAL DATA:  Cough diarrhea nausea and fever EXAM: PORTABLE CHEST 1 VIEW COMPARISON:  04/11/2016 FINDINGS: Right-sided pacing device as before. Patchy bilateral somewhat peripheral airspace opacities. No pleural effusion. Normal heart size. No pneumothorax. IMPRESSION: Low lung volumes. Patchy bilateral ill-defined airspace opacities, concerning for pneumonia. Consider atypical or viral pneumonia in the appropriate clinical setting. Electronically Signed   By:  Donavan Foil M.D.   On: 02/11/2019 02:31    ASSESSMENT / PLAN:  Acute Hypoxic Respiratory Failure secondary to COVID-19 infection & ? Pneumonia -Supplemental O2 as needed to maintain O2 sats>92% -Follow intermittent CXR & ABG as needed -Trend LFT's, CBC, BMP, Troponin, CRP, Ferritin, Fibrinogen, D-dimer, Procalcitonin -CXR with bilateral opacities, will place on empiric Azithromycin & Rocephin for now -Blood cultures, strep pneumo & Legionella urinary antigens pending -IV Solu-Medrol daily -Will start on Remdesivir given high FiO2 requirements and hypoxia; counseled pt on Remdesivir's mechanism of action, side effects of elevated liver enzymes & N/V; pt is in agreement to start Remdesivir; consulted with pharmacy for dosing -Encouraged Prone Positioning >16 hours per day in increments of 2-3 hrs at a time as able to tolerate -Maintain Euvovolemia as able    DISPOSITION: Stepdown GOALS OF CARE: Full Code VTE PROPHYLAXIS: Heparin SQ UPDATES: Updated pt at bedside 02/06/2019  Darel Hong, [facility] Pager: [phone] Cell: [phone]  02/20/2019, 5:12 AM

## 2019-02-13 NOTE — ED Notes (Signed)
.. ED TO INPATIENT HANDOFF REPORT  ED Nurse Name and Phone #: Deneise Lever [phone]  S Name/Age/Gender Clayton Sawyer 78 y.o. male Room/Bed: ED19A/ED19A  Code Status   Code Status: Prior  Home/SNF/Other Home Patient oriented to: self, place, time and situation Is this baseline? Yes   Triage Complete: Triage complete  Chief Complaint Cough ( Possible COVID)   Triage Note Pt to the er for cough, diarrhea, nausea, fever, and overall feeling bad. Wife was dx with covid and hospitalized in [place]. Pt is obviously SOB and sats are 84%   Allergies Allergies  Allergen Reactions  . Lisinopril Cough  . Losartan Rash  . Statins Other (See Comments)    Body aches    Level of Care/Admitting Diagnosis ED Disposition    ED Disposition Condition [facility]: [place] [[id number]]  Level of Care: Stepdown [14]  Covid Evaluation: Confirmed COVID Positive  Diagnosis: Respiratory distress [678938]  Admitting Physician: Harrie Foreman [[id number]]  Attending Physician: Harrie Foreman [[id number]]  Estimated length of stay: past midnight tomorrow  Certification:: I certify this patient will need inpatient services for at least 2 midnights  PT Class (Do Not Modify): Inpatient [101]  PT Acc Code (Do Not Modify): Private [1]       B Medical/Surgery History Past Medical History:  Diagnosis Date  . Abdominal hernia without obstruction or gangrene 05/22/2014  . 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 March 2006 reports two drug-eluting stents placed in the right coronary artery (reported this procedure not currently available).  2. December 2006 cardiac catheterization:  EF 78%, 50% stenosis in the ramus intermedius and the right posterior diagonal artery.  Patient treated medically.  3. December 2006 cardiac MRI negative for ischemia.  EF normal.  4. April 2008 cardiac catheteri  .  Calculus of kidney 09/26/2011   Overview:  1. Has bilateral retained kidney stones.  2. Status post urethral stenting.  Overview:  Overview:  1. Has bilateral retained kidney stones.  2. Status post urethral stenting.   . Chronotropic incompetence 07/22/2011   Overview:  Status post permanent pacemaker placement 06/12/2009 by Dr. Norm Salt.  Overview:  Overview:  Status post permanent pacemaker placement 06/12/2009 by Dr. Norm Salt.   . Diastolic dysfunction 82/42/3536   Overview:  1. June 2009 had progressive orthopnea and dyspnea on exertion and lower extremity edema.  2. July 2009 2D echocardiogram: EF >55%, mild concentric LVH, diastolic dysfunction Grade II, LAE 4.6 cm, mild tricuspid regurgitation.  Overview:  Overview:  1. June 2009 had progressive orthopnea and dyspnea on exertion and lower extremity edema.  2. July 2009 2D echocardiogram: EF >55%, mild   . Essential (primary) hypertension 07/22/2011  . 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   . Hyperlipidemia 07/22/2011   Overview:  1. December 2006 TC 142, TG 92, HDL 32, LDL 93.  2. November 2008 TC 181, TG 97, HDL 29, LDL 33.  3. January 2010 LDL 137, HDL 29.  4. Referral to Dr. Harrell Gave clinic. Patient started on Niacin, which he did not tolerate. Additionally started on Crestor alternating 2.5 mg every other day, which he also was unable to tolerate. Overview:  Overview:  1. December 2006 TC 142, TG 92, HDL 3  .  Hypertension   . Malignant neoplasm of prostate ([place]) 03/04/2013  . MI, old   . Migraine without status migrainosus, not intractable 05/22/2014  . Osteoarthritis 09/26/2011   Overview:  Right total knee replacement.  Overview:  Overview:  Right total knee replacement.   . Presence of cardiac pacemaker 07/21/2011    Overview:  Dual-chamber pacemaker is a Biotronik via DR reference number [id number] serial number [id number].  Right atrial lead is a Comptroller number [id number] serial number [id number].  Right ventricular lead is Biotronik Setrox [id number] reference number is [id number] serial number [id number]. Device implanted 06/11/2009 Overview:  Overview:  Dual-chamber pacemaker is a Biotronik via DR reference n  . Varicella without complication 67/61/9509   Past Surgical History:  Procedure Laterality Date  . CARDIAC CATHETERIZATION Right 04/12/2016   Procedure: Left Heart Cath and Coronary Angiography;  Surgeon: Dionisio David, MD;  Location: [facility];  Service: Cardiovascular;  Laterality: Right;  . knee replacements    . LEFT HEART CATH AND CORONARY ANGIOGRAPHY Left 12/19/2017   Procedure: LEFT HEART CATH AND CORONARY ANGIOGRAPHY;  Surgeon: Dionisio David, MD;  Location: [facility];  Service: Cardiovascular;  Laterality: Left;  . stents     cardiac stents     A IV Location/Drains/Wounds Patient Lines/Drains/Airways Status   Active Line/Drains/Airways    Name:   Placement date:   Placement time:   Site:   Days:   Peripheral IV 02/06/2019 Left Antecubital   02/15/2019    0259    Antecubital   less than 1   Peripheral IV 02/04/2019 Right Antecubital   02/28/2019    0348    Antecubital   less than 1          Intake/Output Last 24 hours No intake or output data in the 24 hours ending 02/09/2019 0409  Labs/Imaging Results for orders placed or performed during the hospital encounter of 02/10/2019 (from the past 48 hour(s))  Lactic acid, plasma     Status: None   Collection Time: 02/21/2019  2:00 AM  Result Value Ref Range   Lactic Acid, Venous 1.9 0.5 - 1.9 mmol/L    Comment: Performed at [facility], [address] [place], [place] [address]  CBC WITH DIFFERENTIAL     Status: Abnormal   Collection Time: 02/04/2019  2:00 AM  Result Value Ref Range   WBC 9.9 4.0 - 10.5 K/uL    RBC 4.98 4.22 - 5.81 MIL/uL   Hemoglobin 15.0 13.0 - 17.0 g/dL   HCT 44.9 39.0 - 52.0 %   MCV 90.2 80.0 - 100.0 fL   MCH 30.1 26.0 - 34.0 pg   MCHC 33.4 30.0 - 36.0 g/dL   RDW 14.1 11.5 - 15.5 %   Platelets 208 150 - 400 K/uL   nRBC 0.0 0.0 - 0.2 %   Neutrophils Relative % 87 %   Neutro Abs 8.7 (H) 1.7 - 7.7 K/uL   Lymphocytes Relative 6 %   Lymphs Abs 0.6 (L) 0.7 - 4.0 K/uL   Monocytes Relative 6 %   Monocytes Absolute 0.6 0.1 - 1.0 K/uL   Eosinophils Relative 0 %   Eosinophils Absolute 0.0 0.0 - 0.5 K/uL   Basophils Relative 0 %   Basophils Absolute 0.0 0.0 - 0.1 K/uL   Immature Granulocytes 1 %   Abs Immature Granulocytes 0.07 0.00 - 0.07 K/uL    Comment: Performed at [facility], [address] [place],  [place] [address]  Comprehensive metabolic panel     Status: Abnormal   Collection Time: 02/21/2019  2:00 AM  Result Value Ref Range   Sodium 135 135 - 145 mmol/L   Potassium 3.9 3.5 - 5.1 mmol/L   Chloride 99 98 - 111 mmol/L   CO2 22 22 - 32 mmol/L   Glucose, Bld 139 (H) 70 - 99 mg/dL   BUN 18 8 - 23 mg/dL   Creatinine, Ser 1.10 0.61 - 1.24 mg/dL   Calcium 8.0 (L) 8.9 - 10.3 mg/dL   Total Protein 6.8 6.5 - 8.1 g/dL   Albumin 3.4 (L) 3.5 - 5.0 g/dL   AST 32 15 - 41 U/L   ALT 16 0 - 44 U/L   Alkaline Phosphatase 69 38 - 126 U/L   Total Bilirubin 1.0 0.3 - 1.2 mg/dL   GFR calc non Af Amer >60 >60 mL/min   GFR calc Af Amer >60 >60 mL/min   Anion gap 14 5 - 15    Comment: Performed at [facility], [address] [place], [place] [address]  Fibrin derivatives D-Dimer     Status: Abnormal   Collection Time: 02/26/2019  2:00 AM  Result Value Ref Range   Fibrin derivatives D-dimer ([facility]) 1,244.59 (H) 0.00 - 499.00 ng/mL (FEU)    Comment: (NOTE) <> Exclusion of Venous Thromboembolism (VTE) - OUTPATIENT ONLY   (Emergency Department or [place])   0-499 ng/ml (FEU): With a low to intermediate pretest probability                      for VTE this test  result excludes the diagnosis                      of VTE.   >499 ng/ml (FEU) : VTE not excluded; additional work up for VTE is                      required. <> Testing on Inpatients and Evaluation of Disseminated Intravascular   Coagulation (DIC) Reference Range:   0-499 ng/ml (FEU) Performed at [facility], [address] [place], [place] [address]   Procalcitonin     Status: None   Collection Time: 02/27/2019  2:00 AM  Result Value Ref Range   Procalcitonin 0.35 ng/mL    Comment:        Interpretation: PCT (Procalcitonin) <= 0.5 ng/mL: Systemic infection (sepsis) is not likely. Local bacterial infection is possible. (NOTE)       Sepsis PCT Algorithm           Lower Respiratory Tract                                      Infection PCT Algorithm    ----------------------------     ----------------------------         PCT < 0.25 ng/mL                PCT < 0.10 ng/mL         Strongly encourage             Strongly discourage   discontinuation of antibiotics    initiation of antibiotics    ----------------------------     -----------------------------       PCT 0.25 - 0.50 ng/mL  PCT 0.10 - 0.25 ng/mL               OR       >80% decrease in PCT            Discourage initiation of                                            antibiotics      Encourage discontinuation           of antibiotics    ----------------------------     -----------------------------         PCT >= 0.50 ng/mL              PCT 0.26 - 0.50 ng/mL               AND        <80% decrease in PCT             Encourage initiation of                                             antibiotics       Encourage continuation           of antibiotics    ----------------------------     -----------------------------        PCT >= 0.50 ng/mL                  PCT > 0.50 ng/mL               AND         increase in PCT                  Strongly encourage                                      initiation of  antibiotics    Strongly encourage escalation           of antibiotics                                     -----------------------------                                           PCT <= 0.25 ng/mL                                                 OR                                        > 80% decrease in PCT  Discontinue / Do not initiate                                             antibiotics Performed at [facility], [address] [place], [place] [address]   Lactate dehydrogenase     Status: Abnormal   Collection Time: 02/10/2019  2:00 AM  Result Value Ref Range   LDH 384 (H) 98 - 192 U/L    Comment: Performed at [facility], [address] [place], [place] [address]  Ferritin     Status: None   Collection Time: 02/27/2019  2:00 AM  Result Value Ref Range   Ferritin 146 24 - 336 ng/mL    Comment: Performed at [facility], [address] [place], [place] [address]  Triglycerides     Status: Abnormal   Collection Time: 02/27/2019  2:00 AM  Result Value Ref Range   Triglycerides 156 (H) <150 mg/dL    Comment: Performed at [facility], [address] [place], [place] [address]  Fibrinogen     Status: Abnormal   Collection Time: 02/19/2019  2:00 AM  Result Value Ref Range   Fibrinogen 748 (H) 210 - 475 mg/dL    Comment: Performed at [facility], [address] [place],  [address]  SARS Coronavirus 2 [facility] order, Performed in [place] hospital lab)     Status: Abnormal   Collection Time: 02/22/2019  2:00 AM   Specimen: Nasopharyngeal Swab  Result Value Ref Range   SARS Coronavirus 2 POSITIVE (A) NEGATIVE    Comment: CRITICAL RESULT CALLED TO, READ BACK BY AND VERIFIED WITH: AMY Leonda Cristo @[phone]  02/18/2019 TTG  (NOTE) If result is NEGATIVE SARS-CoV-2 target nucleic acids are NOT DETECTED. The SARS-CoV-2 RNA is generally detectable in upper and lower  respiratory specimens during the acute  phase of infection. The lowest  concentration of SARS-CoV-2 viral copies this assay can detect is 250  copies / mL. A negative result does not preclude SARS-CoV-2 infection  and should not be used as the sole basis for treatment or other  patient management decisions.  A negative result may occur with  improper specimen collection / handling, submission of specimen other  than nasopharyngeal swab, presence of viral mutation(s) within the  areas targeted by this assay, and inadequate number of viral copies  (<250 copies / mL). A negative result must be combined with clinical  observations, patient history, and epidemiological information. If result is POSITIVE SARS-CoV-2 target nucleic acids are DETECTED.  The SARS-CoV-2 RNA is generally detectable in upper and lower  respiratory specimens during the acute phase of infection.  Positive  results are indicative of active infection with SARS-CoV-2.  Clinical  correlation with patient history and other diagnostic information is  necessary to determine patient infection status.  Positive results do  not rule out bacterial infection or co-infection with other viruses. If result is PRESUMPTIVE POSTIVE SARS-CoV-2 nucleic acids MAY BE PRESENT.   A presumptive positive result was obtained on the submitted specimen  and confirmed on repeat testing.  While 2019 novel coronavirus  (SARS-CoV-2) nucleic acids may be present in the submitted sample  additional confirmatory testing may be necessary for epidemiological  and / or clinical management purposes  to differentiate between  SARS-CoV-2 and other Sarbecovirus currently known to infect humans.  If clinically indicated additional testing  with an alternate test  methodology [phone])  is advised. The SARS-CoV-2 RNA is generally  detectable in upper and lower respiratory specimens during the acute  phase of infection. The expected result is Negative. Fact Sheet for Patients:   StrictlyIdeas.no Fact Sheet for Healthcare Providers: BankingDealers.co.za This test is not yet approved or cleared by the [place] FDA and has been authorized for detection and/or diagnosis of SARS-CoV-2 by FDA under an Emergency Use Authorization (EUA).  This EUA will remain in effect (meaning this test can be used) for the duration of the COVID-19 declaration under Section 564(b)(1) of the Act, 21 U.S.C. section 360bbb-3(b)(1), unless the authorization is terminated or revoked sooner. Performed at [facility], [address] [place], [place] [address]    Dg Chest Port 1 View  Result Date: 02/19/2019 CLINICAL DATA:  Cough diarrhea nausea and fever EXAM: PORTABLE CHEST 1 VIEW COMPARISON:  04/11/2016 FINDINGS: Right-sided pacing device as before. Patchy bilateral somewhat peripheral airspace opacities. No pleural effusion. Normal heart size. No pneumothorax. IMPRESSION: Low lung volumes. Patchy bilateral ill-defined airspace opacities, concerning for pneumonia. Consider atypical or viral pneumonia in the appropriate clinical setting. Electronically Signed   By: Donavan Foil M.D.   On: 02/24/2019 02:31    Pending Labs Unresulted Labs (From admission, onward)    Start     Ordered   02/04/2019 0153  Lactic acid, plasma  Now then every 2 hours,   STAT     01/31/2019 0152   01/30/2019 0153  Blood Culture (routine x 2)  BLOOD CULTURE X 2,   STAT     02/17/2019 0152   02/10/2019 0153  C-reactive protein  Once,   STAT     01/30/2019 0152   Signed and Held  Creatinine, serum  (enoxaparin (LOVENOX)    CrCl >/= 30 ml/min)  Weekly,   R    Comments: while on enoxaparin therapy    Signed and Held   Signed and Held  TSH  Add-on,   R     Signed and Held          Vitals/Pain Today's Vitals   02/02/2019 0306 02/08/2019 0313 02/06/2019 0314 02/12/2019 0343  BP: (!) 143/74     Pulse: 65 62 65 64  Resp: (!) 35 (!) 33 (!) 27 (!) 37  Temp:       TempSrc:      SpO2: (!) 89% 90% 90% 93%  Weight:      Height:      PainSc:        Isolation Precautions Airborne and Contact precautions  Medications Medications  dexamethasone (DECADRON) injection 10 mg (10 mg Intravenous Given 02/08/2019 0219)  dexamethasone (DECADRON) injection 10 mg (10 mg Intravenous Given 02/17/2019 0230)    Mobility walks Low fall risk   Focused Assessments Pulmonary Assessment Handoff:  Lung sounds: Bilateral Breath Sounds: Diminished O2 Device: NRB O2 Flow Rate (L/min): 15 L/min      R Recommendations: See Admitting Provider Note  Report given to:   Additional Notes:

## 2019-02-14 ENCOUNTER — Encounter (HOSPITAL_COMMUNITY): Payer: Self-pay | Admitting: *Deleted

## 2019-02-14 ENCOUNTER — Inpatient Hospital Stay (HOSPITAL_COMMUNITY): Payer: Medicare Other

## 2019-02-14 ENCOUNTER — Inpatient Hospital Stay: Payer: Self-pay

## 2019-02-14 DIAGNOSIS — U071 COVID-19: Principal | ICD-10-CM | POA: Diagnosis present

## 2019-02-14 DIAGNOSIS — J069 Acute upper respiratory infection, unspecified: Secondary | ICD-10-CM

## 2019-02-14 LAB — BLOOD CULTURE ID PANEL (REFLEXED)

## 2019-02-14 LAB — CBC
HCT: 47.9 % (ref 39.0–52.0)
Hemoglobin: 15.6 g/dL (ref 13.0–17.0)
MCH: 29.4 pg (ref 26.0–34.0)
MCHC: 32.6 g/dL (ref 30.0–36.0)
MCV: 90.2 fL (ref 80.0–100.0)
Platelets: 332 10*3/uL (ref 150–400)
RBC: 5.31 MIL/uL (ref 4.22–5.81)
RDW: 14.1 % (ref 11.5–15.5)
WBC: 14.9 10*3/uL — ABNORMAL HIGH (ref 4.0–10.5)
nRBC: 0 % (ref 0.0–0.2)

## 2019-02-14 LAB — GLUCOSE, CAPILLARY
Glucose-Capillary: 153 mg/dL — ABNORMAL HIGH (ref 70–99)
Glucose-Capillary: 169 mg/dL — ABNORMAL HIGH (ref 70–99)
Glucose-Capillary: 239 mg/dL — ABNORMAL HIGH (ref 70–99)
Glucose-Capillary: 132 mg/dL — ABNORMAL HIGH (ref 70–99)
Glucose-Capillary: 149 mg/dL — ABNORMAL HIGH (ref 70–99)

## 2019-02-14 LAB — D-DIMER, QUANTITATIVE: D-Dimer, Quant: 2.23 ug/mL-FEU — ABNORMAL HIGH (ref 0.00–0.50)

## 2019-02-14 LAB — NOVEL CORONAVIRUS, NAA: SARS-CoV-2, NAA: DETECTED — AB

## 2019-02-14 LAB — FERRITIN: Ferritin: 209 ng/mL (ref 24–336)

## 2019-02-14 MED ORDER — ZINC SULFATE 220 (50 ZN) MG PO CAPS
220.0000 mg | ORAL_CAPSULE | Freq: Every day | ORAL | Status: DC
  Administered 2019-02-14 – 2019-02-27 (×14): 220 mg via ORAL
  Filled 2019-02-14 (×16): qty 1

## 2019-02-14 MED ORDER — SODIUM CHLORIDE 0.9% FLUSH
3.0000 mL | INTRAVENOUS | Status: DC | PRN
  Administered 2019-02-21: 3 mL via INTRAVENOUS
  Filled 2019-02-14: qty 3

## 2019-02-14 MED ORDER — INSULIN ASPART 100 UNIT/ML ~~LOC~~ SOLN
0.0000 [IU] | Freq: Three times a day (TID) | SUBCUTANEOUS | Status: DC
  Administered 2019-02-14: 3 [IU] via SUBCUTANEOUS
  Administered 2019-02-14: 7 [IU] via SUBCUTANEOUS
  Administered 2019-02-14: 4 [IU] via SUBCUTANEOUS
  Administered 2019-02-15 (×2): 3 [IU] via SUBCUTANEOUS
  Administered 2019-02-15: 7 [IU] via SUBCUTANEOUS
  Administered 2019-02-16 – 2019-02-17 (×4): 3 [IU] via SUBCUTANEOUS
  Administered 2019-02-17: 7 [IU] via SUBCUTANEOUS
  Administered 2019-02-18: 4 [IU] via SUBCUTANEOUS
  Administered 2019-02-18: 3 [IU] via SUBCUTANEOUS
  Administered 2019-02-18: 7 [IU] via SUBCUTANEOUS
  Administered 2019-02-19 (×2): 4 [IU] via SUBCUTANEOUS
  Administered 2019-02-19: 7 [IU] via SUBCUTANEOUS
  Administered 2019-02-20: 11 [IU] via SUBCUTANEOUS
  Administered 2019-02-20: 08:00:00 3 [IU] via SUBCUTANEOUS
  Administered 2019-02-21: 4 [IU] via SUBCUTANEOUS
  Administered 2019-02-21: 13:00:00 7 [IU] via SUBCUTANEOUS
  Administered 2019-02-22: 4 [IU] via SUBCUTANEOUS
  Administered 2019-02-22: 3 [IU] via SUBCUTANEOUS
  Administered 2019-02-23: 13:00:00 7 [IU] via SUBCUTANEOUS
  Administered 2019-02-23: 3 [IU] via SUBCUTANEOUS
  Administered 2019-02-24: 4 [IU] via SUBCUTANEOUS
  Administered 2019-02-24: 3 [IU] via SUBCUTANEOUS
  Administered 2019-02-25 – 2019-02-27 (×5): 4 [IU] via SUBCUTANEOUS

## 2019-02-14 MED ORDER — ACETAMINOPHEN 325 MG PO TABS
650.0000 mg | ORAL_TABLET | Freq: Four times a day (QID) | ORAL | Status: DC | PRN
  Administered 2019-02-24 – 2019-03-06 (×3): 650 mg via ORAL
  Filled 2019-02-14 (×3): qty 2

## 2019-02-14 MED ORDER — SODIUM CHLORIDE 0.9% FLUSH
10.0000 mL | INTRAVENOUS | Status: DC | PRN
  Administered 2019-03-01 (×2): 20 mL
  Filled 2019-02-14 (×2): qty 40

## 2019-02-14 MED ORDER — STUDY - FIBROGEN-COVID - PAMREVLUMAB OR PLACEBO 10 MG/ML IV INFUSION (PI-RAMASWAMY)
35.0000 mg/kg | INTRAVENOUS | Status: AC
  Administered 2019-02-14 – 2019-02-27 (×3): 3700 mg via INTRAVENOUS
  Filled 2019-02-14 (×3): qty 370

## 2019-02-14 MED ORDER — TOCILIZUMAB 400 MG/20ML IV SOLN: 8.0000 mg/kg | Freq: Once | INTRAVENOUS | Status: DC

## 2019-02-14 MED ORDER — SODIUM CHLORIDE 0.9% FLUSH
10.0000 mL | Freq: Two times a day (BID) | INTRAVENOUS | Status: DC
  Administered 2019-02-14 – 2019-02-18 (×7): 10 mL
  Administered 2019-02-19: 20 mL
  Administered 2019-02-19 – 2019-02-25 (×11): 10 mL
  Administered 2019-02-25 – 2019-02-26 (×2): 20 mL
  Administered 2019-02-26: 10 mL
  Administered 2019-02-27: 20 mL
  Administered 2019-02-28 – 2019-03-01 (×3): 10 mL
  Administered 2019-03-01: 20 mL
  Administered 2019-03-02 – 2019-03-04 (×5): 10 mL
  Administered 2019-03-05: 20 mL
  Administered 2019-03-06 – 2019-03-07 (×4): 10 mL

## 2019-02-14 MED ORDER — SODIUM CHLORIDE 0.9% FLUSH
3.0000 mL | Freq: Two times a day (BID) | INTRAVENOUS | Status: DC
  Administered 2019-02-14 – 2019-03-07 (×32): 3 mL via INTRAVENOUS

## 2019-02-14 MED ORDER — SODIUM CHLORIDE 0.9 % IV SOLN
100.0000 mg | INTRAVENOUS | Status: AC
  Administered 2019-02-14 – 2019-02-17 (×4): 100 mg via INTRAVENOUS
  Filled 2019-02-14 (×4): qty 20

## 2019-02-14 MED ORDER — VITAMIN C 500 MG PO TABS
500.0000 mg | ORAL_TABLET | Freq: Every day | ORAL | Status: DC
  Administered 2019-02-14 – 2019-02-27 (×14): 500 mg via ORAL
  Filled 2019-02-14 (×14): qty 1

## 2019-02-14 MED ORDER — METOPROLOL SUCCINATE ER 25 MG PO TB24
25.0000 mg | ORAL_TABLET | Freq: Every day | ORAL | Status: DC
  Administered 2019-02-14 – 2019-02-26 (×13): 25 mg via ORAL
  Filled 2019-02-14 (×16): qty 1

## 2019-02-14 MED ORDER — ALBUTEROL SULFATE HFA 108 (90 BASE) MCG/ACT IN AERS
2.0000 | INHALATION_SPRAY | RESPIRATORY_TRACT | Status: DC | PRN
  Administered 2019-02-16: 2 via RESPIRATORY_TRACT
  Filled 2019-02-14: qty 6.7

## 2019-02-14 MED ORDER — CHLORHEXIDINE GLUCONATE CLOTH 2 % EX PADS
6.0000 | MEDICATED_PAD | Freq: Every day | CUTANEOUS | Status: DC
  Administered 2019-02-14 – 2019-03-16 (×30): 6 via TOPICAL

## 2019-02-14 MED ORDER — HEPARIN SODIUM (PORCINE) 5000 UNIT/ML IJ SOLN
5000.0000 [IU] | Freq: Three times a day (TID) | INTRAMUSCULAR | Status: DC
  Administered 2019-02-14: 5000 [IU] via SUBCUTANEOUS
  Filled 2019-02-14: qty 1

## 2019-02-14 MED ORDER — LIP MEDEX EX OINT
TOPICAL_OINTMENT | CUTANEOUS | Status: DC | PRN
  Filled 2019-02-14: qty 7

## 2019-02-14 MED ORDER — PANTOPRAZOLE SODIUM 20 MG PO TBEC
20.0000 mg | DELAYED_RELEASE_TABLET | Freq: Every day | ORAL | Status: DC
  Administered 2019-02-14 – 2019-02-17 (×4): 20 mg via ORAL
  Filled 2019-02-14 (×5): qty 1

## 2019-02-14 MED ORDER — ACETAMINOPHEN 650 MG RE SUPP: 650.0000 mg | Freq: Four times a day (QID) | RECTAL | Status: DC | PRN

## 2019-02-14 MED ORDER — INSULIN ASPART 100 UNIT/ML ~~LOC~~ SOLN
0.0000 [IU] | Freq: Every day | SUBCUTANEOUS | Status: DC
  Administered 2019-02-16 – 2019-02-27 (×2): 2 [IU] via SUBCUTANEOUS

## 2019-02-14 MED ORDER — POLYETHYLENE GLYCOL 3350 17 G PO PACK
17.0000 g | PACK | Freq: Every day | ORAL | Status: DC | PRN
  Administered 2019-02-23: 17 g via ORAL

## 2019-02-14 MED ORDER — NITROGLYCERIN 0.4 MG SL SUBL: 0.4000 mg | SUBLINGUAL_TABLET | SUBLINGUAL | Status: DC | PRN

## 2019-02-14 MED ORDER — TRAZODONE HCL 50 MG PO TABS
50.0000 mg | ORAL_TABLET | Freq: Every evening | ORAL | Status: DC | PRN
  Administered 2019-02-14 – 2019-02-23 (×7): 50 mg via ORAL
  Filled 2019-02-14 (×6): qty 1

## 2019-02-14 MED ORDER — METHYLPREDNISOLONE SODIUM SUCC 125 MG IJ SOLR
60.0000 mg | Freq: Two times a day (BID) | INTRAMUSCULAR | Status: DC
  Administered 2019-02-14 – 2019-02-21 (×15): 60 mg via INTRAVENOUS
  Filled 2019-02-14 (×16): qty 2

## 2019-02-14 MED ORDER — ONDANSETRON HCL 4 MG/2ML IJ SOLN: 4.0000 mg | Freq: Four times a day (QID) | INTRAMUSCULAR | Status: DC | PRN

## 2019-02-14 MED ORDER — SODIUM CHLORIDE 0.9 % IV SOLN: 250.0000 mL | INTRAVENOUS | Status: DC | PRN

## 2019-02-14 MED ORDER — CLOPIDOGREL BISULFATE 75 MG PO TABS
75.0000 mg | ORAL_TABLET | Freq: Every day | ORAL | Status: DC
  Administered 2019-02-14 – 2019-02-27 (×14): 75 mg via ORAL
  Filled 2019-02-14 (×14): qty 1

## 2019-02-14 MED ORDER — STUDY - FIBROGEN-COVID - PAMREVLUMAB OR PLACEBO 10 MG/ML IV INFUSION (PI-RAMASWAMY)
35.0000 mg/kg | Freq: Once | INTRAVENOUS | Status: DC
  Filled 2019-02-14 (×2): qty 370

## 2019-02-14 MED ORDER — ONDANSETRON HCL 4 MG PO TABS: 4.0000 mg | ORAL_TABLET | Freq: Four times a day (QID) | ORAL | Status: DC | PRN

## 2019-02-14 MED ORDER — ALBUTEROL SULFATE (2.5 MG/3ML) 0.083% IN NEBU: 2.5000 mg | INHALATION_SOLUTION | RESPIRATORY_TRACT | Status: DC | PRN

## 2019-02-14 MED ORDER — ENOXAPARIN SODIUM 40 MG/0.4ML ~~LOC~~ SOLN
40.0000 mg | Freq: Two times a day (BID) | SUBCUTANEOUS | Status: DC
  Administered 2019-02-14 – 2019-02-28 (×28): 40 mg via SUBCUTANEOUS
  Filled 2019-02-14 (×27): qty 0.4

## 2019-02-14 MED ORDER — HYDRALAZINE HCL 20 MG/ML IJ SOLN
10.0000 mg | Freq: Four times a day (QID) | INTRAMUSCULAR | Status: DC | PRN
  Administered 2019-02-26: 10 mg via INTRAVENOUS
  Filled 2019-02-14: qty 1

## 2019-02-14 NOTE — Research (Signed)
IP infusion started at 1735. Prior to infusion start at 1733 vitals signs obtained by this [place].  RR: 25, HR 96, BP 151/89, Temp 97.5, Weight 105.7kg, Height 175.3cm. Verified 0.2 um micro filter utilized.  Jon Billings, [place], [place] [phone] Cell # [phone]

## 2019-02-14 NOTE — Progress Notes (Signed)
Peripherally Inserted Central Catheter/Midline Placement  The IV Nurse has discussed with the patient and/or persons authorized to consent for the patient, the purpose of this procedure and the potential benefits and risks involved with this procedure.  The benefits include less needle sticks, lab draws from the catheter, and the patient may be discharged home with the catheter. Risks include, but not limited to, infection, bleeding, blood clot (thrombus formation), and puncture of an artery; nerve damage and irregular heartbeat and possibility to perform a PICC exchange if needed/ordered by physician.  Alternatives to this procedure were also discussed.  Bard Power PICC patient education guide, fact sheet on infection prevention and patient information card has been provided to patient /or left at bedside.    PICC/Midline Placement Documentation  PICC Double Lumen 43/15/40 PICC Left Basilic 47 cm 1 cm (Active)  Indication for Insertion or Continuance of Line Prolonged intravenous therapies 02/14/19 1631  Exposed Catheter (cm) 1 cm 02/14/19 1631  Site Assessment Clean;Dry;Intact 02/14/19 1631  Lumen #1 Status Flushed;Blood return noted;Saline locked 02/14/19 1631  Lumen #2 Status Flushed;Blood return noted;Saline locked 02/14/19 1631  Dressing Type Transparent;Securing device 02/14/19 1631  Dressing Status Clean;Dry;Intact;Antimicrobial disc in place 02/14/19 1631  Dressing Change Due 02/21/19 02/14/19 1631       Frances Maywood 02/14/2019, 4:34 PM

## 2019-02-14 NOTE — Progress Notes (Signed)
Spoke w/Rachel, grand-daughter, per pt's request... Updated her on pt's status and answered questions... Apolonio Schneiders verb understanding and had now further questions.   Per Apolonio Schneiders... she would like to be point of contact since grandmother, wife of pt, is recovering from [place] her self... will pass info onto next nurse.

## 2019-02-14 NOTE — Consult Note (Addendum)
Name: Clayton Sawyer MRN: [id number] DOB: 08-28-1940    ADMISSION DATE:  02/16/2019 CONSULTATION DATE:  02/07/2019  REFERRING MD :  Dr. Marcille Blanco  CHIEF COMPLAINT:  Cough  BRIEF PATIENT DESCRIPTION:  Clayton Sawyer is a 78 year old male with a past medical history as listed below who who presents to [facility] ED on 02/03/2019 with complaints of progressive tachypnea, cough, generalized weakness, nausea, diarrhea, and fever.  Of note the patient's wife is currently hospitalized at [facility] secondary to COVID-19 infection.  He reports that he was tested on Friday but has not yet received his results.  Upon presentation to the ED he was noted to be hypoxic with O2 sats 84% on room air and tachypnea with respiratory rate of 24.  Given his work of breathing and hypoxia he was placed on nonrebreather and given 10 mg Decadron IV. Initial work-up in the ED revealed WBC 9.9, lymphocytopenia, glucose 139, LDH 384, triglycerides 156, ferritin 146, fibrinogen 748, fibrin derivatives 1244, lactic acid 1.9, procalcitonin 0.35.  His SARS-CoV-2 PCR is positive in the ED. Chest x-ray reveals patchy bilateral opacities concerning for pneumonia.  Currently [facility] has no bed availability, therefore he is admitted to stepdown unit for further work-up and treatment of acute hypoxic respiratory failure in the setting of COVID-19 infection and questionable secondary pneumonia.  PCCM is consulted for further management   has a past medical history of Abdominal hernia without obstruction or gangrene (05/22/2014), Atherosclerotic heart disease of native coronary artery without angina pectoris (07/22/2011), Calculus of kidney (09/26/2011), Chronotropic incompetence (29/93/7169), Diastolic dysfunction (67/89/3810), Essential (primary) hypertension (07/22/2011), History of stroke (09/26/2011), Hyperlipidemia (07/22/2011), Hypertension, Malignant neoplasm of prostate ([place]) (03/04/2013), MI, old, Migraine without status migrainosus, not  intractable (05/22/2014), Osteoarthritis (09/26/2011), Presence of cardiac pacemaker (07/21/2011), and Varicella without complication (17/51/0258).   SIGNIFICANT EVENTS  7/15>>Admission to Stepdown 7/15>> COVID-19 PCR Positive  STUDIES:  N/A  CULTURES: SARS-CoV-2 PCR 7/15>> positive Blood x2 7/15>> Strep pneumo urinary antigen 7/15>> Legionella urinary antigen 7/15>>  ANTIBIOTICS: Azithromycin 7/15>> Rocephin 7/15>> Remdesivir 7/15>> Solumedrol 02/14/2019 >>  SUBJECTIVE:  On HHFNC . Says tummy time helps.Had breakfast.   VITAL SIGNS: Temp:  [96.3 F (35.7 C)-97.8 F (36.6 C)] 97.4 F (36.3 C) (07/16 0800) Pulse Rate:  [44-99] 89 (07/16 1218) Resp:  [17-40] 32 (07/16 1218) BP: (110-154)/(51-97) 130/92 (07/16 1100) SpO2:  [82 %-98 %] 95 % (07/16 1218) FiO2 (%):  [80 %-100 %] 80 % (07/16 1218) Weight:  [105.7 kg] 105.7 kg (07/15 2152)    LABS    PULMONARY No results for input(s): PHART, PCO2ART, PO2ART, HCO3, TCO2, O2SAT in the last 168 hours.  Invalid input(s): PCO2, PO2  CBC Recent Labs  Lab 02/19/2019 0200  HGB 15.0  HCT 44.9  WBC 9.9  PLT 208    COAGULATION No results for input(s): INR in the last 168 hours.  CARDIAC  No results for input(s): TROPONINI in the last 168 hours. No results for input(s): PROBNP in the last 168 hours.   CHEMISTRY Recent Labs  Lab 02/16/2019 0200  NA 135  K 3.9  CL 99  CO2 22  GLUCOSE 139*  BUN 18  CREATININE 1.10  CALCIUM 8.0*   Estimated Creatinine Clearance: 66.3 mL/min (by C-G formula based on SCr of 1.1 mg/dL).   LIVER Recent Labs  Lab 02/27/2019 0200  AST 32  ALT 16  ALKPHOS 69  BILITOT 1.0  PROT 6.8  ALBUMIN 3.4*     INFECTIOUS Recent Labs  Lab  02/12/2019 0200 02/06/2019 0524  LATICACIDVEN 1.9 1.6  PROCALCITON 0.35  --      ENDOCRINE CBG (last 3)  Recent Labs    02/14/19 0234 02/14/19 0849 02/14/19 1140  GLUCAP 153* 169* 239*         IMAGING x48h  - image(s) personally  visualized  -   highlighted in bold Portable Chest 1 View  Result Date: 02/14/2019 CLINICAL DATA:  Dyspnea EXAM: PORTABLE CHEST 1 VIEW COMPARISON:  01/31/2019 FINDINGS: Cardiac shadow is stable. Pacing device is again seen. Patchy parenchymal opacities are noted primarily within the right lung but to a lesser degree in the left lung base slightly progressed when compare with the prior study. No bony abnormality is noted. IMPRESSION: Slight progression of parenchymal opacities particularly on the right. Electronically Signed   By: Inez Catalina M.D.   On: 02/14/2019 09:04   Dg Chest Port 1 View  Result Date: 02/09/2019 CLINICAL DATA:  Cough diarrhea nausea and fever EXAM: PORTABLE CHEST 1 VIEW COMPARISON:  04/11/2016 FINDINGS: Right-sided pacing device as before. Patchy bilateral somewhat peripheral airspace opacities. No pleural effusion. Normal heart size. No pneumothorax. IMPRESSION: Low lung volumes. Patchy bilateral ill-defined airspace opacities, concerning for pneumonia. Consider atypical or viral pneumonia in the appropriate clinical setting. Electronically Signed   By: Donavan Foil M.D.   On: 02/16/2019 02:31   [place] Ekg Site Rite  Result Date: 02/14/2019 If Site Rite image not attached, placement could not be confirmed due to current cardiac rhythm.       ASSESSMENT / PLAN:  Acute Hypoxic Respiratory Failure secondary to COVID-19 infection & ? Pneumonia   02/14/2019 - > happy severely hypoxemic. AT high risk for decompensation  PLAN\ -Supplemental O2 as needed to maintain O2 sats>92% -IV Solu-Medrol daily -Remdesivir given high FiO2 requirements and hypoxia; counseled pt on Remdesivir's mechanism of action, side effects of elevated liver enzymes & N/V; pt is in agreement to start Remdesivir; consulted with pharmacy for dosing -Encouraged Prone Positioning >16 hours per day in increments of 2-3 hrs at a time as able to tolerate -Maintain Euvovolemia as able  - Discussed Title:  FGCL-3019-098 ([employer] Study) Randomized, Double-Blind, Placebo-Controlled Phase 2 Study of the Efficacy and Safety of Intravenous Pamrevlumab, a Monoclonal Antibody Against Connective Tissue Growth Factor (CTGF), in Hospitalized Patients with Acute COVID-19 Disease and he is interesetd. ti with Acute COVID-19 Disease Protocol #: FGCL-3019-098, Clinical Trials #: NCT [id number] Sponsor: www.fibrogen.com  ([place], [place], [place])   Nature conservation officer Features of Pamrevlumab (FG-3019) the study drug: a recombinant fully human IgG kappa monoclonal antibody that binds to CTGF and is being developed for treatment of diseases in which tissue fibrosis has a major pathogenic role. In particular, Inhibition of CTGF with pamrevlumab attenuates vascular leakage in multiple animal disease models.Therefore, hypothesis for its role in COVID-19 hospitalized patients.    D/w Wife - she is supportive of study but left decision to him and wanted me to d.w Grandaughter - spoke to grandaughter Clayton Sawyer - she reviewed [employer] website, and I emailed the consent. She is supportive of patient enrolling in the stud   - Intubae if worse   SIGNATURE    Dr. Brand Males, M.D., F.C.C.P,  Pulmonary and Critical Care Medicine Staff Physician, [place] Director - Interstitial Lung Disease  Program  Pulmonary [place] at [place], [place], [address]  Pager: [phone], If no answer or between  15:00h - 7:00h: call [phone]  [place] Telephone: [phone]  2:21 PM 02/14/2019

## 2019-02-14 NOTE — Progress Notes (Addendum)
PROGRESS NOTE                                                                                                                                                                                                             Patient Demographics:    Clayton Sawyer, is a 78 y.o. male, DOB - May 11, 1941, CXK:[id number]  Admit date - 02/07/2019   Admitting Physician Courage Denton Brick, MD  Outpatient Primary MD for the patient is Jodi Marble, MD  LOS - 1   No chief complaint on file.      Brief Narrative    78 y.o. male with history of  hypertension, prostate cancer, history of prior stroke  Presents from [facility] with shortness of breath, hypoxic, COVID-19 positive, wife was recently discharged COVID-19 infection, admitted to ICU for reck management of heated high flow nasal cannula.   Subjective:    Clayton Sawyer today reports dyspnea, dry cough, no chest pain , no nausea or vomiting .   Assessment  & Plan :    Principal Problem:   Acute respiratory disease due to COVID-19 virus Active Problems:   Presence of cardiac pacemaker   History of stroke   Essential (primary) hypertension   Atherosclerotic heart disease of native coronary artery without angina pectoris/PCI stents 09/2004, 03/2009 and 10/2011   Bil Pneumonia due to COVID-19 virus   Acute hypoxic respiratory failure due to COVID-19 for pneumonia -Patient currently hypoxic, requiring needed high flow nasal cannula, at 40% liters per minute, with FiO2 of 100%. -Continue with IV Solu-Medrol. -Continue with IV Remdesivir -Give Actemra 7/15 at [facility], will await for repeat labs, follow on CRP, LFTs to see if appropriate to repeat dosing. -Encouraged to prone, use incentive spirometry -need to follow inflammatory markers, CRP significantly better at 18.5 yesterday -Continue with zinc and vitamin C   COVID-19 Labs  Recent Labs    02/11/2019 0200 02/21/2019 0524  FERRITIN 146  --   LDH 384*  --    CRP 18.5* 18.5*    Lab Results  Component Value Date   SARSCOV2NAA POSITIVE (A) 02/23/2019   SARSCOV2NAA Detected (A) 02/08/2019    Positive blood culture Patient has 1+ blood cultures, most likely contaminant, given history of total knee replacement, pacemaker, will repeat blood cultures and monitor off antibiotics  H/O CVA - stable, patient is apparently  intolerant to statins, continue Plavix 75 mg daily  HTN -stable, continue Toprol-XL 25 mg daily  H/o CAD - prior PCI/stent on 09/2004, 03/2009 on 10/2011-double, no ACS type symptoms, continue Toprol-XL 25 mg daily and Plavix 35 mg daily, patient is apparently intolerant to statins  History of chronotropic incompetence/status post pacemaker placement -stable at this time   Code Status : Full Code  Family Communication  : will call family today  Disposition Plan  : pending further work up  Barriers For Discharge : On high oxygen requirement, IV steroids, and Remdesivir  Consults  :  PCCM  Procedures  : None  DVT Prophylaxis  :  Lovenox  Lab Results  Component Value Date   PLT 208 02/03/2019    Antibiotics  :    Anti-infectives (From admission, onward)   Start     Dose/Rate Route Frequency Ordered Stop   02/14/19 0700  remdesivir 100 mg in sodium chloride 0.9 % 250 mL IVPB     100 mg 500 mL/hr over 30 Minutes Intravenous Every 24 hours 02/14/19 0010 02/18/19 0659        Objective:   Vitals:   02/14/19 1000 02/14/19 1020 02/14/19 1100 02/14/19 1200  BP:  (!) 146/61 (!) 130/92   Pulse: 95 94 94 99  Resp: (!) 30  (!) 34 (!) 27  Temp:      TempSrc:      SpO2: (!) 87%  (!) 88% 91%  Weight:      Height:        Wt Readings from Last 3 Encounters:  02/25/2019 105.7 kg  02/16/2019 106.1 kg  07/16/18 108.5 kg     Intake/Output Summary (Last 24 hours) at 02/14/2019 1221 Last data filed at 02/14/2019 0700 Gross per 24 hour  Intake 253 ml  Output 325 ml  Net -72 ml     Physical Exam  Awake Alert,  Oriented X 3, No new F.N deficits, Normal affect Symmetrical Chest wall movement, Good air movement bilaterally, scattered rales RRR,No Gallops,Rubs or new Murmurs, No Parasternal Heave +ve B.Sounds, Abd Soft, No tenderness, No rebound - guarding or rigidity. No Cyanosis, Clubbing or edema, No new Rash or bruise      Data Review:    CBC Recent Labs  Lab 02/06/2019 0200  WBC 9.9  HGB 15.0  HCT 44.9  PLT 208  MCV 90.2  MCH 30.1  MCHC 33.4  RDW 14.1  LYMPHSABS 0.6*  MONOABS 0.6  EOSABS 0.0  BASOSABS 0.0    Chemistries  Recent Labs  Lab 02/25/2019 0200  NA 135  K 3.9  CL 99  CO2 22  GLUCOSE 139*  BUN 18  CREATININE 1.10  CALCIUM 8.0*  AST 32  ALT 16  ALKPHOS 69  BILITOT 1.0   ------------------------------------------------------------------------------------------------------------------ Recent Labs    02/11/2019 0200  TRIG 156*    Lab Results  Component Value Date   HGBA1C 5.9 09/22/2012   ------------------------------------------------------------------------------------------------------------------ Recent Labs    02/10/2019 0200  TSH 2.233   ------------------------------------------------------------------------------------------------------------------ Recent Labs    01/30/2019 0200  FERRITIN 146    Coagulation profile No results for input(s): INR, PROTIME in the last 168 hours.  No results for input(s): DDIMER in the last 72 hours.  Cardiac Enzymes No results for input(s): CKMB, TROPONINI, MYOGLOBIN in the last 168 hours.  Invalid input(s): CK ------------------------------------------------------------------------------------------------------------------ No results found for: BNP  Inpatient Medications  Scheduled Meds: . clopidogrel  75 mg Oral Daily  . enoxaparin (LOVENOX) injection  40 mg Subcutaneous Q12H  . insulin aspart  0-20 Units Subcutaneous TID WC  . insulin aspart  0-5 Units Subcutaneous QHS  . methylPREDNISolone sodium  succinate  60 mg Intravenous Q12H  . metoprolol succinate  25 mg Oral Daily  . pantoprazole  20 mg Oral Daily  . sodium chloride flush  3 mL Intravenous Q12H  . ascorbic acid  500 mg Oral Daily  . zinc sulfate  220 mg Oral Daily   Continuous Infusions: . sodium chloride    . remdesivir 100 mg in NS 250 mL Stopped (02/14/19 0641)  . tocilizumab (ACTEMRA) IV     PRN Meds:.sodium chloride, acetaminophen **OR** acetaminophen, albuterol, hydrALAZINE, nitroGLYCERIN, ondansetron **OR** ondansetron (ZOFRAN) IV, polyethylene glycol, sodium chloride flush, traZODone  Micro Results Recent Results (from the past 240 hour(s))  Novel Coronavirus, NAA ([facility])     Status: Abnormal   Collection Time: 02/08/19  9:29 AM  Result Value Ref Range Status   SARS-CoV-2, NAA Detected (A) Not Detected Final    Comment: Testing was performed using the cobas(R) SARS-CoV-2 test. This test was developed and its performance characteristics determined by [employer]. This test has not been FDA cleared or approved. This test has been authorized by FDA under an Emergency Use Authorization (EUA). This test is only authorized for the duration of time the declaration that circumstances exist justifying the authorization of the emergency use of in vitro diagnostic tests for detection of SARS-CoV-2 virus and/or diagnosis of COVID-19 infection under section 564(b)(1) of the Act, 21 U.S.C. 638VFI-4(P)(3), unless the authorization is terminated or revoked sooner. When diagnostic testing is negative, the possibility of a false negative result should be considered in the context of a patient's recent exposures and the presence of clinical signs and symptoms consistent with COVID-19. An individual without symptoms of COVID-19 and who is not shedding SARS-CoV-2 virus would expect to have a negati ve (not detected) result in this assay.   SARS Coronavirus 2 [facility] order, Performed in [place] hospital lab)      Status: Abnormal   Collection Time: 02/12/2019  2:00 AM   Specimen: Nasopharyngeal Swab  Result Value Ref Range Status   SARS Coronavirus 2 POSITIVE (A) NEGATIVE Final    Comment: CRITICAL RESULT CALLED TO, READ BACK BY AND VERIFIED WITH: AMY SMITH @333  02/11/2019 TTG  (NOTE) If result is NEGATIVE SARS-CoV-2 target nucleic acids are NOT DETECTED. The SARS-CoV-2 RNA is generally detectable in upper and lower  respiratory specimens during the acute phase of infection. The lowest  concentration of SARS-CoV-2 viral copies this assay can detect is 250  copies / mL. A negative result does not preclude SARS-CoV-2 infection  and should not be used as the sole basis for treatment or other  patient management decisions.  A negative result may occur with  improper specimen collection / handling, submission of specimen other  than nasopharyngeal swab, presence of viral mutation(s) within the  areas targeted by this assay, and inadequate number of viral copies  (<250 copies / mL). A negative result must be combined with clinical  observations, patient history, and epidemiological information. If result is POSITIVE SARS-CoV-2 target nucleic acids are DETECTED.  The SARS-CoV-2 RNA is generally detectable in upper and lower  respiratory specimens during the acute phase of infection.  Positive  results are indicative of active infection with SARS-CoV-2.  Clinical  correlation with patient history and other diagnostic information is  necessary to determine patient infection status.  Positive results do  not rule out bacterial infection or co-infection with other viruses. If result is PRESUMPTIVE POSTIVE SARS-CoV-2 nucleic acids MAY BE PRESENT.   A presumptive positive result was obtained on the submitted specimen  and confirmed on repeat testing.  While 2019 novel coronavirus  (SARS-CoV-2) nucleic acids may be present in the submitted sample  additional confirmatory testing may be necessary for  epidemiological  and / or clinical management purposes  to differentiate between  SARS-CoV-2 and other Sarbecovirus currently known to infect humans.  If clinically indicated additional testing with an alternate test  methodology [phone])  is advised. The SARS-CoV-2 RNA is generally  detectable in upper and lower respiratory specimens during the acute  phase of infection. The expected result is Negative. Fact Sheet for Patients:  StrictlyIdeas.no Fact Sheet for Healthcare Providers: BankingDealers.co.za This test is not yet approved or cleared by the [place] FDA and has been authorized for detection and/or diagnosis of SARS-CoV-2 by FDA under an Emergency Use Authorization (EUA).  This EUA will remain in effect (meaning this test can be used) for the duration of the COVID-19 declaration under Section 564(b)(1) of the Act, 21 U.S.C. section 360bbb-3(b)(1), unless the authorization is terminated or revoked sooner. Performed at [facility], [address] [place], [place] [address]   Blood Culture (routine x 2)     Status: None (Preliminary result)   Collection Time: 02/20/2019  2:01 AM   Specimen: BLOOD  Result Value Ref Range Status   Specimen Description BLOOD LEFT ASSIST CONTROL  Final   Special Requests   Final    BOTTLES DRAWN AEROBIC AND ANAEROBIC Blood Culture results may not be optimal due to an excessive volume of blood received in culture bottles   Culture   Final    NO GROWTH 1 DAY Performed at [facility], [address], [place], [place] [address]    Report Status PENDING  Incomplete  Blood Culture (routine x 2)     Status: None (Preliminary result)   Collection Time: 02/06/2019  3:44 AM   Specimen: BLOOD  Result Value Ref Range Status   Specimen Description BLOOD RIGHT ASSIST CONTROL  Final   Special Requests   Final    BOTTLES DRAWN AEROBIC AND ANAEROBIC Blood Culture adequate volume    Culture  Setup Time   Final    Organism ID to follow GRAM POSITIVE COCCI AEROBIC BOTTLE ONLY CRITICAL RESULT CALLED TO, READ BACK BY AND VERIFIED WITH: [name] CHAPPELL ON 02/14/2019 AT 11:26 [place]. Performed at [facility], [address] [place],  [address]    Culture [facility] POSITIVE COCCI  Final   Report Status PENDING  Incomplete  Blood Culture ID Panel (Reflexed)     Status: Abnormal   Collection Time: 02/07/2019  3:44 AM  Result Value Ref Range Status   Enterococcus species NOT DETECTED NOT DETECTED Final   Listeria monocytogenes NOT DETECTED NOT DETECTED Final   Staphylococcus species DETECTED (A) NOT DETECTED Final    Comment: Methicillin (oxacillin) resistant coagulase negative staphylococcus. Possible blood culture contaminant (unless isolated from more than one blood culture draw or clinical case suggests pathogenicity). No antibiotic treatment is indicated for blood  culture contaminants. CRITICAL RESULT CALLED TO, READ BACK BY AND VERIFIED WITH:  [name] CHAPPELL ON 02/14/2019 AT 11:26. TIK    Staphylococcus aureus (BCID) NOT DETECTED NOT DETECTED Final   Methicillin resistance DETECTED (A) NOT DETECTED Final    Comment: CRITICAL RESULT CALLED TO, READ BACK BY AND VERIFIED  WITH: [name] CHAPPELL ON 02/14/2019 AT 11:26. TIK    Streptococcus species NOT DETECTED NOT DETECTED Final   Streptococcus agalactiae NOT DETECTED NOT DETECTED Final   Streptococcus pneumoniae NOT DETECTED NOT DETECTED Final   Streptococcus pyogenes NOT DETECTED NOT DETECTED Final   Acinetobacter baumannii NOT DETECTED NOT DETECTED Final   Enterobacteriaceae species NOT DETECTED NOT DETECTED Final   Enterobacter cloacae complex NOT DETECTED NOT DETECTED Final   Escherichia coli NOT DETECTED NOT DETECTED Final   Klebsiella oxytoca NOT DETECTED NOT DETECTED Final   Klebsiella pneumoniae NOT DETECTED NOT DETECTED Final   Proteus species NOT DETECTED NOT DETECTED Final   Serratia marcescens NOT  DETECTED NOT DETECTED Final   Haemophilus influenzae NOT DETECTED NOT DETECTED Final   Neisseria meningitidis NOT DETECTED NOT DETECTED Final   Pseudomonas aeruginosa NOT DETECTED NOT DETECTED Final   Candida albicans NOT DETECTED NOT DETECTED Final   Candida glabrata NOT DETECTED NOT DETECTED Final   Candida krusei NOT DETECTED NOT DETECTED Final   Candida parapsilosis NOT DETECTED NOT DETECTED Final   Candida tropicalis NOT DETECTED NOT DETECTED Final    Comment: Performed at [facility], [address] [place], [place] [address]  MRSA PCR Screening     Status: None   Collection Time: 03/01/2019  4:51 AM   Specimen: Nasopharyngeal  Result Value Ref Range Status   MRSA by PCR NEGATIVE NEGATIVE Final    Comment:        The GeneXpert MRSA Assay (FDA approved for NASAL specimens only), is one component of a comprehensive MRSA colonization surveillance program. It is not intended to diagnose MRSA infection nor to guide or monitor treatment for MRSA infections. Performed at [facility], [address], [place], [place] [address]     Radiology Reports Portable Chest 1 View  Result Date: 02/14/2019 CLINICAL DATA:  Dyspnea EXAM: PORTABLE CHEST 1 VIEW COMPARISON:  02/26/2019 FINDINGS: Cardiac shadow is stable. Pacing device is again seen. Patchy parenchymal opacities are noted primarily within the right lung but to a lesser degree in the left lung base slightly progressed when compare with the prior study. No bony abnormality is noted. IMPRESSION: Slight progression of parenchymal opacities particularly on the right. Electronically Signed   By: Inez Catalina M.D.   On: 02/14/2019 09:04   Dg Chest Port 1 View  Result Date: 02/17/2019 CLINICAL DATA:  Cough diarrhea nausea and fever EXAM: PORTABLE CHEST 1 VIEW COMPARISON:  04/11/2016 FINDINGS: Right-sided pacing device as before. Patchy bilateral somewhat peripheral airspace opacities. No pleural effusion. Normal heart  size. No pneumothorax. IMPRESSION: Low lung volumes. Patchy bilateral ill-defined airspace opacities, concerning for pneumonia. Consider atypical or viral pneumonia in the appropriate clinical setting. Electronically Signed   By: Donavan Foil M.D.   On: 02/25/2019 02:31   [place] Ekg Site Rite  Result Date: 02/14/2019 If Site Rite image not attached, placement could not be confirmed due to current cardiac rhythm.   Time Spent in minutes  35 minutes   Phillips Climes M.D on 02/14/2019 at 12:21 PM  Between 7am to 7pm - Pager - [phone]  After 7pm go to [email or web address] - password [facility]  [facility] -  Office  [phone]

## 2019-02-14 NOTE — Progress Notes (Signed)
PHARMACY - PHYSICIAN COMMUNICATION CRITICAL VALUE ALERT - BLOOD CULTURE IDENTIFICATION (BCID)  Clayton Sawyer is an 78 y.o. male who presented to [facility] on 02/10/2019 with a chief complaint of COVID 19 Pneumonia.  Assessment: No source of infection noted, suspect possible contaminant with only 1/4 bottles +GPC, but will evaluate.  PMH includes OA s/p right total knee replacement 09/26/2011 and Cardiac pacemaker implanted on 06/11/2009.  WBC 9.9, Afebrile, PCT 0.35  Name of physician (or Provider) Contacted: Dr. Waldron [facility]  Current antibiotics: none 7/15 Azith/CTX x1 7/15 Remdesivir >> 7/19 7/15 Actemra x1 7/16 Actemra  Changes to prescribed antibiotics recommended: None   Results for orders placed or performed during the hospital encounter of 02/20/2019  Blood Culture ID Panel (Reflexed) (Collected: 02/27/2019  3:44 AM)  Result Value Ref Range   Enterococcus species NOT DETECTED NOT DETECTED   Listeria monocytogenes NOT DETECTED NOT DETECTED   Staphylococcus species DETECTED (A) NOT DETECTED   Staphylococcus aureus (BCID) NOT DETECTED NOT DETECTED   Methicillin resistance DETECTED (A) NOT DETECTED   Streptococcus species NOT DETECTED NOT DETECTED   Streptococcus agalactiae NOT DETECTED NOT DETECTED   Streptococcus pneumoniae NOT DETECTED NOT DETECTED   Streptococcus pyogenes NOT DETECTED NOT DETECTED   Acinetobacter baumannii NOT DETECTED NOT DETECTED   Enterobacteriaceae species NOT DETECTED NOT DETECTED   Enterobacter cloacae complex NOT DETECTED NOT DETECTED   Escherichia coli NOT DETECTED NOT DETECTED   Klebsiella oxytoca NOT DETECTED NOT DETECTED   Klebsiella pneumoniae NOT DETECTED NOT DETECTED   Proteus species NOT DETECTED NOT DETECTED   Serratia marcescens NOT DETECTED NOT DETECTED   Haemophilus influenzae NOT DETECTED NOT DETECTED   Neisseria meningitidis NOT DETECTED NOT DETECTED   Pseudomonas aeruginosa NOT DETECTED NOT DETECTED   Candida albicans NOT DETECTED NOT  DETECTED   Candida glabrata NOT DETECTED NOT DETECTED   Candida krusei NOT DETECTED NOT DETECTED   Candida parapsilosis NOT DETECTED NOT DETECTED   Candida tropicalis NOT DETECTED NOT DETECTED   Gretta Arab PharmD, BCPS Clinical pharmacist phone 7am- 5pm: [phone] 02/14/2019 11:41 AM

## 2019-02-14 NOTE — Research (Signed)
   End of research infusion note  Throughout infusion monitored and checked on patient via telepresence.Tolerated it fine. Infusion ended at 19.32 02/14/2019  - RN confirmed every drop has been flushed and fully infused into patient. I personally visualzied bag and tubing and confirmed 100% of content infused. Route was Left picc line.  Clayton Sawyer expressed he is doing well  Vitals at 7:40 PM 0 130/64, RR 38, HR 87 and pulse ox 94% on 40L Heated high flow nasal cannula 70%, Temp is 97.4  We will continue to monitor him per protocol for 1h post infusion termination     SIGNATURE    Dr. Brand Males, M.D., F.C.C.P, ACRP-CPI Pulmonary and Critical Care Medicine Research Investigator, [facility] [place] Staff Physician, [place] Director - Interstitial Lung Disease  Program  Pulmonary [facility] and [facility] @ [place], [place], [address]  Pager: [phone], If no answer or between  15:00h - 7:00h: call [phone] Telephone: [phone]  7:41 PM 02/14/2019

## 2019-02-14 NOTE — Progress Notes (Signed)
Pt educated on the importance of the prone position. Pt stated he did not think he could fully lay in the prone position but would lay on his side and and try to lean more toward his belly. By doing this the pt was able to maintain a 02 of 87% to 90% on the [place] 100%. Will continue to monitor pt.

## 2019-02-14 NOTE — Research (Signed)
Consent note   - discussed the below study with Clayton Sawyer [birth year] -> gave ICF to read - > rechecked 1h later -> then discussed with wife and grandaughter Clayton Sawyer. They are supportive of his interest in participating . Patient expressed desire to participate. All questions answered. He signed ICF - RN present for signature discucssion    - Post ICF vitals  -  Temp 97.75F, Pulse o  93% on 80% fio2, 40 L HHFNC, BP 138/59, RR  26. HR 103     SIGNATURE    Dr. Brand Males, M.D., F.C.C.P, ACRP-CPI Pulmonary and Critical Care Medicine Research Investigator, [facility] [place] Staff Physician, [place] Director - Interstitial Lung Disease  Program  Pulmonary [facility] and [facility] @ [place], [place], [address]  Pager: [phone], If no answer or between  15:00h - 7:00h: call [phone] Telephone: [phone]  3:54 PM 02/14/2019  /////////////////////////////    Title: [id number] (FibroGen Study) Randomized, Double-Blind, Placebo-Controlled Phase 2 Study of the Efficacy and Safety of Intravenous Pamrevlumab, a Monoclonal Antibody Against Connective Tissue Growth Factor (CTGF), in Hospitalized Patients with Acute COVID-19 Disease .  alized Patients with Acute COVID-19 Disease  Protocol #: FGCL-3019-098, Clinical Trials #: [id number] Sponsor: [email or web address]  ([place], [place], [place])    Nature conservation officer Features of Pamrevlumab (FG-3019) the study drug: a recombinant fully human IgG kappa monoclonal antibody that binds to CTGF and is being developed for treatment of diseases in which tissue fibrosis has a major pathogenic role. In particular, Inhibition of CTGF with pamrevlumab attenuates vascular leakage in multiple animal disease models.Therefore, hypothesis for its role in COVID-19 hospitalized patients.  Half-life: 58-141 hours; t1/2 increases with increasing doses  Key Inclusion  Criteria:  . Age 40-85 years . Confirmed SARS-CoV-2 infection by a FDA-authorized diagnostic test (e.g., polymerase chain reaction [PCR] or other approved assay from any specimen source; note: a positive serology/antibody test for SARS-CoV-2 does NOT qualify as evidence of acute COVID-19 disease) . Respiratory compromise requiring hospitalization for COVID-19 disease as evidenced by at least one (or more) of the following criteria: . Interstitial pneumonia on CXR or HRCT (findings of consolidation or ground glass opacities), OR . Peripheral capillary oxygen saturation (SpO2) < 94% on room air, OR . Requiring non-invasive supplemental oxygen (e.g., nasal cannula, face mask) to maintain SpO2  . Not requiring mechanical ventilation and/or extracorporeal membrane oxygenation (ECMO) use at time of randomization . Subject (or legally authorized representative) able to understand and sign a written informed consent form   Key Exclusion Criteria  . Male subjects who are pregnant or nursing . Participation in a clinical trial with another investigational drug for COVID-19 disease (eg: [facility] Expanded Access Program Plasma protocol) . Anticipated discharge from the hospital or transfer to another hospital or long-term care facility which is not a study site within 72 hours of randomization . History of allergic or anaphylactic reaction to human, humanized, chimeric or murine monoclonal antibodies   Key Protocol Features . Screening: Up to 2 days . Treatment: Up to 28 Days (4 infusions) - incl. as outpatient if discharged from hospital < 28 days, and patient agrees to return . For patients unwilling/unable to return: remote assessment of safety and VS  . Post-Treatment Follow-up: 4 weeks after last dose      Interactions No known drug interactions.     Safety Data - Psychologist, forensic (based  on Edition 17.0, dated 05/25/2017)  624 subjects have been exposed to pamrevlumab, 270 with  IPF The most common TEAEs in all subjects with IPF: Cough, fatigue, dyspnea, upper respiratory tract infection, bronchitis, nasopharyngitis No known effect on qtc prolongation, renal or hepatic issues   Phase 1 study Study [id number], n=21 Enrolled 21 subjects with IPF No dose-limiting toxicities All adverse events were considered mild to moderate 76% of subjects experienced at least 1 TEAE The most common TEAEs: Pyrexia (n=3, 14% of subjects) Cough (n=3, 14% of subjects) Dyspnea (n=3, 14% of subjects) Respiratory tract infection (n=2, 9% of subjects)   Phase 2 study Study [id number], n=90 Enrolled 90 subjects with IPF 14 deaths occurred, 13 were deemed to be related to IPF 20% of subjects experienced a TEAE that led to study drug discontinuation IPF and respiratory failure were the two most common reasons for discontinuation; occurring in 8% and 3% of patients, respectively The most common TEAEs: Cough (n=34, 38% of subjects) Dyspnea (n=24, 27% of subjects) Fatigue (n=24, 27% of subjects) Nasopharyngitis (n=20, 22% of subjects) Respiratory tract infection (n=19, 21% of subjects) Bronchitis (n=18, 20% of subjects)   Phase 2 study Study [id number], n=103 103 subjects enrolled with IPF 9 deaths occurred; 4 deemed related to IPF, 5 related to other respiratory causes 18% of subjects experienced a TEAE that led to study drug discontinuation The most common TEAEs: Cough (n=48, 47% of subjects) Respiratory tract infection (n=39, 38% of subjects) IPF (n=32, 31% of subjects) Dysnpea (n=30, 29% of subjects) Sinusitis (n=21, 21% of subjects) Fatigue (n=20, 19% of subjects)   Overall, pamrevlumab has been well tolerated. Infusion-related reactions have been reported at a rate that is consistent with other human monoclonal antibodies. Based on the mechanism of action of pamrevlumab, by inhibiting CTGF, there was some concern that this would cause impaired wound healing or  impaired bone fracture healing. However, there were no serious adverse events reported in any study relating to these two issues.     ...................................................  1. Scientific Purpose  Clinical research is designed to produce generalizable knowledge and to answer questions about the safety and efficacy of intervention(s) under study in order to determine whether or not they may be useful for the care of future patients.  2. Study Procedures  Participation in a trial may involve procedures or tests, in addition to the intervention(s) under study, that are intended only or primarily to generate scientific knowledge and that are otherwise not necessary for patient care.   3. Uncertainty  For intervention(s) under study in clinical research, there often is less knowledge and more uncertainty about the risks and benefits to a population of trial participants than there is when a doctor offers a patient standard interventions.   4. Adherence to Protocol  Administration of the intervention(s) under study is typically based on a strict protocol with defined dose, scheduling, and use or avoidance of concurrent medications, compared to administration of standard interventions.  5. Clinician as Investigator  Clinicians who are in health care settings provide treatment; in a clinical trial setting, they are also investigating safety and efficacy of an intervention. In otherwise your doctor or nurse practitioner can be wearing 2 hats - one as care giver another as Company secretary  6. Patient as Visual merchandiser Subject  Patients participating in research trials are research subjects or volunteers. In other words participating in research is 100% voluntary and at one's own free weill. The decision to participate or not participate will NOT affect  patient care and the doctor-patient relationship in any way

## 2019-02-15 LAB — GLUCOSE, CAPILLARY
Glucose-Capillary: 149 mg/dL — ABNORMAL HIGH (ref 70–99)
Glucose-Capillary: 233 mg/dL — ABNORMAL HIGH (ref 70–99)
Glucose-Capillary: 147 mg/dL — ABNORMAL HIGH (ref 70–99)
Glucose-Capillary: 197 mg/dL — ABNORMAL HIGH (ref 70–99)

## 2019-02-15 LAB — COMPREHENSIVE METABOLIC PANEL
ALT: 20 U/L (ref 0–44)
AST: 37 U/L (ref 15–41)
Albumin: 2.7 g/dL — ABNORMAL LOW (ref 3.5–5.0)
Alkaline Phosphatase: 58 U/L (ref 38–126)
Anion gap: 12 (ref 5–15)
BUN: 30 mg/dL — ABNORMAL HIGH (ref 8–23)
CO2: 23 mmol/L (ref 22–32)
Calcium: 8.6 mg/dL — ABNORMAL LOW (ref 8.9–10.3)
Chloride: 105 mmol/L (ref 98–111)
Creatinine, Ser: 1.07 mg/dL (ref 0.61–1.24)
GFR calc Af Amer: >60 mL/min (ref >60)
GFR calc non Af Amer: >60 mL/min (ref >60)
Glucose, Bld: 155 mg/dL — ABNORMAL HIGH (ref 70–99)
Potassium: 4.2 mmol/L (ref 3.5–5.1)
Sodium: 140 mmol/L (ref 135–145)
Total Bilirubin: 0.5 mg/dL (ref 0.3–1.2)
Total Protein: 5.9 g/dL — ABNORMAL LOW (ref 6.5–8.1)

## 2019-02-15 LAB — CBC
HCT: 43.2 % (ref 39.0–52.0)
Hemoglobin: 14.5 g/dL (ref 13.0–17.0)
MCH: 30.2 pg (ref 26.0–34.0)
MCHC: 33.6 g/dL (ref 30.0–36.0)
MCV: 90 fL (ref 80.0–100.0)
Platelets: 280 10*3/uL (ref 150–400)
RBC: 4.8 MIL/uL (ref 4.22–5.81)
RDW: 14.1 % (ref 11.5–15.5)
WBC: 13.2 10*3/uL — ABNORMAL HIGH (ref 4.0–10.5)
nRBC: 0 % (ref 0.0–0.2)

## 2019-02-15 LAB — C-REACTIVE PROTEIN: CRP: 5.1 mg/dL — ABNORMAL HIGH (ref <1.0)

## 2019-02-15 LAB — STREP PNEUMONIAE URINARY ANTIGEN: Strep Pneumo Urinary Antigen: NEGATIVE

## 2019-02-15 LAB — FERRITIN: Ferritin: 170 ng/mL (ref 24–336)

## 2019-02-15 LAB — D-DIMER, QUANTITATIVE: D-Dimer, Quant: 1.9 ug/mL-FEU — ABNORMAL HIGH (ref 0.00–0.50)

## 2019-02-15 LAB — LACTIC ACID, PLASMA: Lactic Acid, Venous: 1.9 mmol/L (ref 0.5–1.9)

## 2019-02-15 LAB — MAGNESIUM: Magnesium: 1.9 mg/dL (ref 1.7–2.4)

## 2019-02-15 LAB — PHOSPHORUS: Phosphorus: 3.3 mg/dL (ref 2.5–4.6)

## 2019-02-15 MED ORDER — MAGNESIUM SULFATE 2 GM/50ML IV SOLN
2.0000 g | Freq: Once | INTRAVENOUS | Status: AC
  Administered 2019-02-15: 2 g via INTRAVENOUS
  Filled 2019-02-15: qty 50

## 2019-02-15 MED ORDER — POLYETHYLENE GLYCOL 3350 17 G PO PACK
17.0000 g | PACK | Freq: Two times a day (BID) | ORAL | Status: DC
  Administered 2019-02-15 – 2019-02-26 (×15): 17 g via ORAL
  Filled 2019-02-15 (×16): qty 1

## 2019-02-15 MED ORDER — ENSURE ENLIVE PO LIQD
237.0000 mL | Freq: Three times a day (TID) | ORAL | Status: DC
  Administered 2019-02-15 – 2019-02-26 (×17): 237 mL via ORAL
  Filled 2019-02-15 (×28): qty 237

## 2019-02-15 MED ORDER — FUROSEMIDE 10 MG/ML IJ SOLN
40.0000 mg | Freq: Once | INTRAMUSCULAR | Status: AC
  Administered 2019-02-15: 40 mg via INTRAVENOUS
  Filled 2019-02-15: qty 4

## 2019-02-15 NOTE — Consult Note (Addendum)
Name: Clayton Sawyer MRN: [id number] DOB: 19-Nov-1940    ADMISSION DATE:  02/27/2019 CONSULTATION DATE:  02/25/2019  REFERRING MD :  Dr. Marcille Blanco  CHIEF COMPLAINT:  Cough  BRIEF PATIENT DESCRIPTION:  Mr. Clayton Sawyer is a 78 year old male with a past medical history as listed below who who presents to [facility] ED on 01/31/2019 with complaints of progressive tachypnea, cough, generalized weakness, nausea, diarrhea, and fever.  Of note the patient's wife is currently hospitalized at [facility] secondary to COVID-19 infection.  He reports that he was tested on Friday but has not yet received his results.  Upon presentation to the ED he was noted to be hypoxic with O2 sats 84% on room air and tachypnea with respiratory rate of 24.  Given his work of breathing and hypoxia he was placed on nonrebreather and given 10 mg Decadron IV. Initial work-up in the ED revealed WBC 9.9, lymphocytopenia, glucose 139, LDH 384, triglycerides 156, ferritin 146, fibrinogen 748, fibrin derivatives 1244, lactic acid 1.9, procalcitonin 0.35.  His SARS-CoV-2 PCR is positive in the ED. Chest x-ray reveals patchy bilateral opacities concerning for pneumonia.  Currently [facility] has no bed availability, therefore he is admitted to stepdown unit for further work-up and treatment of acute hypoxic respiratory failure in the setting of COVID-19 infection and questionable secondary pneumonia.  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   SIGNIFICANT EVENTS  7/15>>Admission to Stepdown 7/15>> COVID-19 PCR Positive 7/16 On HHFNC . Says tummy time helps.Had breakfast.. Left Picc. S/p research drug [email or web address]  STUDIES:  N/A  CULTURES: SARS-CoV-2 PCR 7/15>> positive Blood x2 7/15>> Coag neg staph on BCID Strep pneumo urinary antigen 7/15>> Legionella urinary antigen 7/15>>  ANTIBIOTICS: Tocilizumab 7/15  [facility] ER  Remdesivir 7/15>> Solumedrol 02/14/2019 >> Research Drug [email or web address]  (CTGF Mab Pamrevlumab) v Placebo 02/14/2019 >>      SUBJECTIVE:   7/17 - s/p research drug yesterday. Tis AM - hypoxemia better 45L/100% to 35L/65%. Also did prone. Feeling better. Currently pulse ox 89%. Taste is comin back. CRP much better; Marland Kitchen D-dimer better. Walked around in bed    VITAL SIGNS: Temp:  [97.4 F (36.3 C)-98.6 F (37 C)] 98.6 F (37 C) (07/17 0800) Pulse Rate:  [44-150] 93 (07/17 1000) Resp:  [21-35] 24 (07/17 1000) BP: (92-154)/(43-89) 146/68 (07/17 1000) SpO2:  [75 %-100 %] 90 % (07/17 1000) FiO2 (%):  [65 %-100 %] 65 % (07/17 0738)     General Appearance:  Looks beeter Head:  Normocephalic, without obvious abnormality, atraumatic Eyes:  PERRL - yes, conjunctiva/corneas - clear     Ears:  Normal external ear canals, both ears Nose:  G tube - no but has HHFNC Throat:  ETT TUBE - no , OG tube - no Neck:  Supple,  No enlargement/tenderness/nodules Lungs: Clear to auscultation bilaterally but at base has crackles Heart:  S1 and S2 normal, no murmur, CVP - no.  Pressors - no Abdomen:  Soft, no masses, no organomegaly Genitalia / Rectal:  Not done Extremities:  Extremities- intact Skin:  ntact in exposed areas .  Sacral area - x Neurologic:  Sedation - nonne -> RASS - +1, sitting and talking . Moves all 4s - yes. CAM-ICU - neg . Orientation - x3+        LABS    Results for Clayton Sawyer (MRN [id number]) as of 02/15/2019 11:27  Ref. Range 02/21/2019 02:00 02/14/2019 05:24 02/15/2019 05:30  CRP Latest Ref Range: <1.0 mg/dL 18.5 (H) 18.5 (H) 5.1 (H)  Results for Clayton Sawyer (MRN [id number]) as of 02/15/2019 11:27  Ref. Range 02/14/2019 18:55 02/15/2019 05:30  D-Dimer, Quant Latest Ref Range: 0.00 - 0.50 ug/mL-FEU 2.23 (H) 1.90 (H)    PULMONARY No results for input(s): PHART, PCO2ART, PO2ART, HCO3, TCO2, O2SAT in the last 168 hours.  Invalid input(s): PCO2, PO2  CBC Recent Labs  Lab 02/17/2019 0200 02/14/19 1855 02/15/19 0530  HGB 15.0 15.6 14.5  HCT 44.9 47.9 43.2  WBC 9.9 14.9* 13.2*  PLT 208 332 280    COAGULATION No results for input(s): INR in the last 168 hours.  CARDIAC  No results for input(s): TROPONINI in the last 168 hours. No results for input(s): PROBNP in the last 168 hours.   CHEMISTRY Recent Labs  Lab 02/15/2019 0200 02/15/19 0530  NA 135 140  K 3.9 4.2  CL 99 105  CO2 22 23  GLUCOSE 139* 155*  BUN 18 30*  CREATININE 1.10 1.07  CALCIUM 8.0* 8.6*  MG  --  1.9  PHOS  --  3.3   Estimated Creatinine Clearance: 68.2 mL/min (by C-G formula based on SCr of 1.07 mg/dL).   LIVER Recent Labs  Lab 02/18/2019 0200 02/15/19 0530  AST 32 37  ALT 16 20  ALKPHOS 69 58  BILITOT 1.0 0.5  PROT 6.8 5.9*  ALBUMIN 3.4* 2.7*     INFECTIOUS Recent Labs  Lab 02/24/2019 0200 02/11/2019 0524 02/15/19 0530  LATICACIDVEN 1.9 1.6 1.9  PROCALCITON 0.35  --   --      ENDOCRINE CBG (last 3)  Recent Labs    02/14/19 1651 02/14/19 2222 02/15/19 0750  GLUCAP 132* 149* 149*         IMAGING x48h  - image(s) personally visualized  -   highlighted in bold Portable Chest 1 View  Result Date: 02/14/2019 CLINICAL DATA:  Dyspnea EXAM: PORTABLE CHEST 1 VIEW COMPARISON:  02/14/2019 FINDINGS: Cardiac shadow is stable. Pacing device is again seen. Patchy parenchymal opacities are noted primarily within the  right lung but to a lesser degree in the left lung base slightly progressed when compare with the prior study. No bony abnormality is noted. IMPRESSION: Slight progression of parenchymal opacities particularly on the right. Electronically Signed   By: Inez Catalina M.D.   On: 02/14/2019 09:04   [place] Ekg Site Rite  Result Date: 02/14/2019 If Site Rite image not attached, placement could not be confirmed due to current cardiac rhythm.       ASSESSMENT / PLAN:  Acute Hypoxic Respiratory Failure secondary to COVID-19 infection & ? Pneumonia     02/15/2019 - > happy severely hypoxemic but significantly better   PLAN\ -Supplemental O2 as needed to maintain O2 sats>92% -IV Solu-Medrol daily per triad -Remdesivir - research drug - d1 complete 7/16 . Pending - D14, D21, D28 (can be done as opd if dishcarged earlier)  -Maintain Euvovolemia as able - ccheck cxr and d-dimer 02/16/19    Family updates: Grandaughter - Ms Carleene Overlie update over phone by CCM MD  SIGNATURE    Dr. Belva Crome  Chase Caller, M.D., F.C.C.P,  Pulmonary and Critical Care Medicine Staff Physician, [place] Director - Interstitial Lung Disease  Program  Pulmonary [place] [facility] [place], [place], [address]  Pager: [phone], If no answer or between  15:00h - 7:00h: call [phone] Telephone: [phone]  11:26 AM 02/15/2019

## 2019-02-15 NOTE — Progress Notes (Signed)
Spoke with pt's son Coralyn Mark over the phone and answered any questions he had and updated him. Pt also spoke with his son.

## 2019-02-15 NOTE — Progress Notes (Signed)
PROGRESS NOTE                                                                                                                                                                                                             Patient Demographics:    Clayton Sawyer, is a 78 y.o. male, DOB - 03/29/1941, YKZ:[id number]  Admit date - 02/11/2019   Admitting Physician Courage Denton Brick, MD  Outpatient Primary MD for the patient is Jodi Marble, MD  LOS - 2   No chief complaint on file.      Brief Narrative    78 y.o. male with history of  hypertension, prostate cancer, history of prior stroke  Presents from [facility] with shortness of breath, hypoxic, COVID-19 positive, wife was recently discharged COVID-19 infection, admitted to ICU for  management of heated high flow nasal cannula.   Subjective:    Clayton Sawyer today reports he had a good night sleep, has been compliant with pronating, denies any chest pain, nausea or vomiting .   Assessment  & Plan :    Principal Problem:   Acute respiratory disease due to COVID-19 virus Active Problems:   Presence of cardiac pacemaker   History of stroke   Essential (primary) hypertension   Atherosclerotic heart disease of native coronary artery without angina pectoris/PCI stents 09/2004, 03/2009 and 10/2011   Bil Pneumonia due to COVID-19 virus   Acute hypoxic respiratory failure due to COVID-19 for pneumonia -Patient currently hypoxic, requiring needed high flow nasal cannula, he has improved oxygen requirements today, he is on FiO2 65%, and 35 L high flow nasal cannula -Continue with IV Solu-Medrol. -Continue with IV Remdesivir - research drug - d1 complete 7/16 . Pending - D14, D21, D28 (can be done as opd if dishcarged earlier) - received actemra 7/[facility] at [facility],  -Encouraged to prone, use incentive spirometry -Continue to follow inflammatory markers closely -Continue with zinc and vitamin C   COVID-19 Labs   Recent Labs    02/25/2019 0200 02/04/2019 0524 02/14/19 1855 02/15/19 0530  DDIMER  --   --  2.23* 1.90*  FERRITIN 146  --  209 170  LDH 384*  --   --   --   CRP 18.5* 18.5*  --  5.1*    Lab Results  Component Value Date   SARSCOV2NAA POSITIVE (A)  02/26/2019   SARSCOV2NAA Detected (A) 02/08/2019    Positive blood culture Patient has 1+ blood cultures, most likely contaminant, given history of total knee replacement, pacemaker, will repeat blood cultures and monitor off antibiotics  H/O CVA - stable, patient is apparently intolerant to statins, continue Plavix 75 mg daily  HTN -stable, continue Toprol-XL 25 mg daily  H/o CAD - prior PCI/stent on 09/2004, 03/2009 on 10/2011-double, no ACS type symptoms, continue Toprol-XL 25 mg daily and Plavix 35 mg daily, patient is apparently intolerant to statins  History of chronotropic incompetence/status post pacemaker placement -stable at this time   Code Status : Full Code  Family Communication  : Family been called and updated by pulmonary 7/16  Disposition Plan  : pending further work up  Barriers For Discharge : On high oxygen requirement, IV steroids, and Remdesivir  Consults  :  PCCM  Procedures  : None  DVT Prophylaxis  :  Lovenox  Lab Results  Component Value Date   PLT 280 02/15/2019    Antibiotics  :    Anti-infectives (From admission, onward)   Start     Dose/Rate Route Frequency Ordered Stop   02/14/19 0700  remdesivir 100 mg in sodium chloride 0.9 % 250 mL IVPB     100 mg 500 mL/hr over 30 Minutes Intravenous Every 24 hours 02/14/19 0010 02/18/19 0659        Objective:   Vitals:   02/15/19 1149 02/15/19 1200 02/15/19 1300 02/15/19 1400  BP:  (!) 148/74 (!) 142/67 136/62  Pulse: 94 (!) 32 66 (!) 103  Resp: (!) 22 (!) 37 (!) 32 (!) 39  Temp:      TempSrc:      SpO2: 90% (!) 85% (!) 89% (!) 83%  Weight:      Height:        Wt Readings from Last 3 Encounters:  02/01/2019 105.7 kg  02/06/2019 106.1  kg  07/16/18 108.5 kg     Intake/Output Summary (Last 24 hours) at 02/15/2019 1430 Last data filed at 02/15/2019 1413 Gross per 24 hour  Intake 1570 ml  Output 1625 ml  Net -55 ml     Physical Exam  Awake Alert, Oriented X 3, No new F.N deficits, Normal affect Symmetrical Chest wall movement, Good air movement bilaterally, sacttered rales RRR,No Gallops,Rubs or new Murmurs, No Parasternal Heave +ve B.Sounds, Abd Soft, No tenderness, No rebound - guarding or rigidity. No Cyanosis, Clubbing or edema, No new Rash or bruise       Data Review:    CBC Recent Labs  Lab 02/21/2019 0200 02/14/19 1855 02/15/19 0530  WBC 9.9 14.9* 13.2*  HGB 15.0 15.6 14.5  HCT 44.9 47.9 43.2  PLT 208 332 280  MCV 90.2 90.2 90.0  MCH 30.1 29.4 30.2  MCHC 33.4 32.6 33.6  RDW 14.1 14.1 14.1  LYMPHSABS 0.6*  --   --   MONOABS 0.6  --   --   EOSABS 0.0  --   --   BASOSABS 0.0  --   --     Chemistries  Recent Labs  Lab 02/16/2019 0200 02/15/19 0530  NA 135 140  K 3.9 4.2  CL 99 105  CO2 22 23  GLUCOSE 139* 155*  BUN 18 30*  CREATININE 1.10 1.07  CALCIUM 8.0* 8.6*  MG  --  1.9  AST 32 37  ALT 16 20  ALKPHOS 69 58  BILITOT 1.0 0.5   ------------------------------------------------------------------------------------------------------------------ Recent Labs  02/16/2019 0200  TRIG 156*    Lab Results  Component Value Date   HGBA1C 5.9 09/22/2012   ------------------------------------------------------------------------------------------------------------------ Recent Labs    02/23/2019 0200  TSH 2.233   ------------------------------------------------------------------------------------------------------------------ Recent Labs    02/14/19 1855 02/15/19 0530  FERRITIN 209 170    Coagulation profile No results for input(s): INR, PROTIME in the last 168 hours.  Recent Labs    02/14/19 1855 02/15/19 0530  DDIMER 2.23* 1.90*    Cardiac Enzymes No results for  input(s): CKMB, TROPONINI, MYOGLOBIN in the last 168 hours.  Invalid input(s): CK ------------------------------------------------------------------------------------------------------------------ No results found for: BNP  Inpatient Medications  Scheduled Meds: . Chlorhexidine Gluconate Cloth  6 each Topical Daily  . clopidogrel  75 mg Oral Daily  . enoxaparin (LOVENOX) injection  40 mg Subcutaneous Q12H  . feeding supplement (ENSURE ENLIVE)  237 mL Oral TID BM  . insulin aspart  0-20 Units Subcutaneous TID WC  . insulin aspart  0-5 Units Subcutaneous QHS  . methylPREDNISolone sodium succinate  60 mg Intravenous Q12H  . metoprolol succinate  25 mg Oral Daily  . pantoprazole  20 mg Oral Daily  . polyethylene glycol  17 g Oral BID  . sodium chloride flush  10-40 mL Intracatheter Q12H  . sodium chloride flush  3 mL Intravenous Q12H  . pamrevlumab or placebo  35 mg/kg Intravenous Q7 days   Followed by  . [START ON 03/14/2019] pamrevlumab or placebo  35 mg/kg Intravenous Once  . ascorbic acid  500 mg Oral Daily  . zinc sulfate  220 mg Oral Daily   Continuous Infusions: . sodium chloride    . magnesium sulfate bolus IVPB 2 g (02/15/19 1403)  . remdesivir 100 mg in NS 250 mL 100 mg (02/15/19 0759)   PRN Meds:.sodium chloride, acetaminophen **OR** acetaminophen, albuterol, hydrALAZINE, lip balm, nitroGLYCERIN, ondansetron **OR** ondansetron (ZOFRAN) IV, polyethylene glycol, sodium chloride flush, sodium chloride flush, traZODone  Micro Results Recent Results (from the past 240 hour(s))  Novel Coronavirus, NAA ([facility])     Status: Abnormal   Collection Time: 02/08/19  9:29 AM  Result Value Ref Range Status   SARS-CoV-2, NAA Detected (A) Not Detected Final    Comment: Testing was performed using the cobas(R) SARS-CoV-2 test. This test was developed and its performance characteristics determined by [employer]. This test has not been FDA cleared or approved. This test has  been authorized by FDA under an Emergency Use Authorization (EUA). This test is only authorized for the duration of time the declaration that circumstances exist justifying the authorization of the emergency use of in vitro diagnostic tests for detection of SARS-CoV-2 virus and/or diagnosis of COVID-19 infection under section 564(b)(1) of the Act, 21 U.S.C. 944HQP-5(F)(1), unless the authorization is terminated or revoked sooner. When diagnostic testing is negative, the possibility of a false negative result should be considered in the context of a patient's recent exposures and the presence of clinical signs and symptoms consistent with COVID-19. An individual without symptoms of COVID-19 and who is not shedding SARS-CoV-2 virus would expect to have a negati ve (not detected) result in this assay.   SARS Coronavirus 2 [facility] order, Performed in [facility] hospital lab)     Status: Abnormal   Collection Time: 02/09/2019  2:00 AM   Specimen: Nasopharyngeal Swab  Result Value Ref Range Status   SARS Coronavirus 2 POSITIVE (A) NEGATIVE Final    Comment: CRITICAL RESULT CALLED TO, READ BACK BY AND VERIFIED WITH: AMY SMITH @333   02/17/2019 TTG  (NOTE) If result is NEGATIVE SARS-CoV-2 target nucleic acids are NOT DETECTED. The SARS-CoV-2 RNA is generally detectable in upper and lower  respiratory specimens during the acute phase of infection. The lowest  concentration of SARS-CoV-2 viral copies this assay can detect is 250  copies / mL. A negative result does not preclude SARS-CoV-2 infection  and should not be used as the sole basis for treatment or other  patient management decisions.  A negative result may occur with  improper specimen collection / handling, submission of specimen other  than nasopharyngeal swab, presence of viral mutation(s) within the  areas targeted by this assay, and inadequate number of viral copies  (<250 copies / mL). A negative result must be combined with  clinical  observations, patient history, and epidemiological information. If result is POSITIVE SARS-CoV-2 target nucleic acids are DETECTED.  The SARS-CoV-2 RNA is generally detectable in upper and lower  respiratory specimens during the acute phase of infection.  Positive  results are indicative of active infection with SARS-CoV-2.  Clinical  correlation with patient history and other diagnostic information is  necessary to determine patient infection status.  Positive results do  not rule out bacterial infection or co-infection with other viruses. If result is PRESUMPTIVE POSTIVE SARS-CoV-2 nucleic acids MAY BE PRESENT.   A presumptive positive result was obtained on the submitted specimen  and confirmed on repeat testing.  While 2019 novel coronavirus  (SARS-CoV-2) nucleic acids may be present in the submitted sample  additional confirmatory testing may be necessary for epidemiological  and / or clinical management purposes  to differentiate between  SARS-CoV-2 and other Sarbecovirus currently known to infect humans.  If clinically indicated additional testing with an alternate test  methodology [phone])  is advised. The SARS-CoV-2 RNA is generally  detectable in upper and lower respiratory specimens during the acute  phase of infection. The expected result is Negative. Fact Sheet for Patients:  StrictlyIdeas.no Fact Sheet for Healthcare Providers: BankingDealers.co.za This test is not yet approved or cleared by the [place] FDA and has been authorized for detection and/or diagnosis of SARS-CoV-2 by FDA under an Emergency Use Authorization (EUA).  This EUA will remain in effect (meaning this test can be used) for the duration of the COVID-19 declaration under Section 564(b)(1) of the Act, 21 U.S.C. section 360bbb-3(b)(1), unless the authorization is terminated or revoked sooner. Performed at [facility], [address] [place], [place] [address]   Blood Culture (routine x 2)     Status: None (Preliminary result)   Collection Time: 02/12/2019  2:01 AM   Specimen: BLOOD  Result Value Ref Range Status   Specimen Description BLOOD LEFT ASSIST CONTROL  Final   Special Requests   Final    BOTTLES DRAWN AEROBIC AND ANAEROBIC Blood Culture results may not be optimal due to an excessive volume of blood received in culture bottles   Culture   Final    NO GROWTH 2 DAYS Performed at [facility], [address] [place], [place] [address]    Report Status PENDING  Incomplete  Blood Culture (routine x 2)     Status: None (Preliminary result)   Collection Time: 02/22/2019  3:44 AM   Specimen: BLOOD  Result Value Ref Range Status   Specimen Description   Final    BLOOD RIGHT ANTECUBITAL Performed at [facility], [place] [address] [place], [place] [address]    Special Requests   Final  BOTTLES DRAWN AEROBIC AND ANAEROBIC Blood Culture adequate volume Performed at [facility], [address] [place], [place] [address]    Culture  Setup Time   Final    GRAM POSITIVE COCCI AEROBIC BOTTLE ONLY CRITICAL RESULT CALLED TO, READ BACK BY AND VERIFIED WITH: [name] CHAPPELL ON 02/14/2019 AT 11:26 [place]. Performed at [facility], [place] [address] [place], [place] [address]    Culture GRAM POSITIVE COCCI  Final   Report Status PENDING  Incomplete  Blood Culture ID Panel (Reflexed)     Status: Abnormal   Collection Time: 02/21/2019  3:44 AM  Result Value Ref Range Status   Enterococcus species NOT DETECTED NOT DETECTED Final   Listeria monocytogenes NOT DETECTED NOT DETECTED Final   Staphylococcus species DETECTED (A) NOT DETECTED Final    Comment: Methicillin (oxacillin) resistant coagulase negative staphylococcus. Possible blood culture contaminant (unless isolated from more than one blood culture draw or clinical case suggests pathogenicity). No antibiotic treatment is indicated for  blood  culture contaminants. CRITICAL RESULT CALLED TO, READ BACK BY AND VERIFIED WITH:  [name] CHAPPELL ON 02/14/2019 AT 11:26. TIK    Staphylococcus aureus (BCID) NOT DETECTED NOT DETECTED Final   Methicillin resistance DETECTED (A) NOT DETECTED Final    Comment: CRITICAL RESULT CALLED TO, READ BACK BY AND VERIFIED WITH: [name] CHAPPELL ON 02/14/2019 AT 11:26. TIK    Streptococcus species NOT DETECTED NOT DETECTED Final   Streptococcus agalactiae NOT DETECTED NOT DETECTED Final   Streptococcus pneumoniae NOT DETECTED NOT DETECTED Final   Streptococcus pyogenes NOT DETECTED NOT DETECTED Final   Acinetobacter baumannii NOT DETECTED NOT DETECTED Final   Enterobacteriaceae species NOT DETECTED NOT DETECTED Final   Enterobacter cloacae complex NOT DETECTED NOT DETECTED Final   Escherichia coli NOT DETECTED NOT DETECTED Final   Klebsiella oxytoca NOT DETECTED NOT DETECTED Final   Klebsiella pneumoniae NOT DETECTED NOT DETECTED Final   Proteus species NOT DETECTED NOT DETECTED Final   Serratia marcescens NOT DETECTED NOT DETECTED Final   Haemophilus influenzae NOT DETECTED NOT DETECTED Final   Neisseria meningitidis NOT DETECTED NOT DETECTED Final   Pseudomonas aeruginosa NOT DETECTED NOT DETECTED Final   Candida albicans NOT DETECTED NOT DETECTED Final   Candida glabrata NOT DETECTED NOT DETECTED Final   Candida krusei NOT DETECTED NOT DETECTED Final   Candida parapsilosis NOT DETECTED NOT DETECTED Final   Candida tropicalis NOT DETECTED NOT DETECTED Final    Comment: Performed at [facility], [address] [place], [place] [address]  MRSA PCR Screening     Status: None   Collection Time: 02/21/2019  4:51 AM   Specimen: Nasopharyngeal  Result Value Ref Range Status   MRSA by PCR NEGATIVE NEGATIVE Final    Comment:        The GeneXpert MRSA Assay (FDA approved for NASAL specimens only), is one component of a comprehensive MRSA colonization surveillance program. It is not  intended to diagnose MRSA infection nor to guide or monitor treatment for MRSA infections. Performed at [facility], [address] [place], [place] [address]   Culture, blood (routine x 2)     Status: None (Preliminary result)   Collection Time: 02/14/19  6:50 PM   Specimen: BLOOD RIGHT HAND  Result Value Ref Range Status   Specimen Description   Final    BLOOD RIGHT HAND Performed at [place] [address], [place], [place] [address]    Special Requests  Final    BOTTLES DRAWN AEROBIC ONLY Blood Culture adequate volume Performed at [facility] [address], [place], [place] [address]    Culture   Final    NO GROWTH < 24 HOURS Performed at [place] [address] [place], [place] [address]    Report Status PENDING  Incomplete  Culture, blood (routine x 2)     Status: None (Preliminary result)   Collection Time: 02/14/19  6:55 PM   Specimen: BLOOD  Result Value Ref Range Status   Specimen Description   Final    BLOOD BLOOD RIGHT FOREARM Performed at [facility] [address], [place], [place] [address]    Special Requests   Final    BOTTLES DRAWN AEROBIC ONLY Blood Culture results may not be optimal due to an excessive volume of blood received in culture bottles Performed at [facility] [address] [place], [place] [address]    Culture   Final    NO GROWTH < 24 HOURS Performed at [facility] [address] [place], [place] [address]    Report Status PENDING  Incomplete    Radiology Reports Portable Chest 1 View  Result Date: 02/14/2019 CLINICAL DATA:  Dyspnea EXAM: PORTABLE CHEST 1 VIEW COMPARISON:  02/08/2019 FINDINGS: Cardiac shadow is stable. Pacing device is again seen. Patchy parenchymal opacities are noted primarily within the right lung but to a lesser degree in the left lung base slightly progressed when compare with the prior study. No  bony abnormality is noted. IMPRESSION: Slight progression of parenchymal opacities particularly on the right. Electronically Signed   By: Inez Catalina M.D.   On: 02/14/2019 09:04   Dg Chest Port 1 View  Result Date: 02/03/2019 CLINICAL DATA:  Cough diarrhea nausea and fever EXAM: PORTABLE CHEST 1 VIEW COMPARISON:  04/11/2016 FINDINGS: Right-sided pacing device as before. Patchy bilateral somewhat peripheral airspace opacities. No pleural effusion. Normal heart size. No pneumothorax. IMPRESSION: Low lung volumes. Patchy bilateral ill-defined airspace opacities, concerning for pneumonia. Consider atypical or viral pneumonia in the appropriate clinical setting. Electronically Signed   By: Donavan Foil M.D.   On: 02/15/2019 02:31   [place] Ekg Site Rite  Result Date: 02/14/2019 If Site Rite image not attached, placement could not be confirmed due to current cardiac rhythm.     Phillips Climes M.D on 02/15/2019 at 2:30 PM  Between 7am to 7pm - Pager - [phone]  After 7pm go to [email or web address] - password [facility]  [facility] -  Office  [phone]

## 2019-02-15 NOTE — Research (Signed)
   Late addendum  This represents that patient was monitored per protocol post infusion for 1h and was doing well   SIGNATURE    Dr. Brand Males, M.D., F.C.C.P, ACRP-CPI Pulmonary and Critical Care Medicine Research Investigator, [facility] [place] Staff Physician, [place] Director - Interstitial Lung Disease  Program  [facility]ulmonary [facility] [place], [place], [address]  Pager: [phone], If no answer or between  15:00h - 7:00h: call [phone] Telephone: [phone]  8:26 AM 02/15/2019

## 2019-02-16 ENCOUNTER — Inpatient Hospital Stay (HOSPITAL_COMMUNITY): Payer: Medicare Other

## 2019-02-16 LAB — CBC
HCT: 44.8 % (ref 39.0–52.0)
Hemoglobin: 14.7 g/dL (ref 13.0–17.0)
MCH: 29.4 pg (ref 26.0–34.0)
MCHC: 32.8 g/dL (ref 30.0–36.0)
MCV: 89.6 fL (ref 80.0–100.0)
Platelets: 296 10*3/uL (ref 150–400)
RBC: 5 MIL/uL (ref 4.22–5.81)
RDW: 14 % (ref 11.5–15.5)
WBC: 13.9 10*3/uL — ABNORMAL HIGH (ref 4.0–10.5)
nRBC: 0 % (ref 0.0–0.2)

## 2019-02-16 LAB — COMPREHENSIVE METABOLIC PANEL
ALT: 22 U/L (ref 0–44)
AST: 36 U/L (ref 15–41)
Albumin: 2.9 g/dL — ABNORMAL LOW (ref 3.5–5.0)
Alkaline Phosphatase: 65 U/L (ref 38–126)
Anion gap: 13 (ref 5–15)
BUN: 36 mg/dL — ABNORMAL HIGH (ref 8–23)
CO2: 25 mmol/L (ref 22–32)
Calcium: 8.6 mg/dL — ABNORMAL LOW (ref 8.9–10.3)
Chloride: 102 mmol/L (ref 98–111)
Creatinine, Ser: 1.2 mg/dL (ref 0.61–1.24)
GFR calc Af Amer: >60 mL/min (ref >60)
GFR calc non Af Amer: 58 mL/min — ABNORMAL LOW (ref >60)
Glucose, Bld: 151 mg/dL — ABNORMAL HIGH (ref 70–99)
Potassium: 4.4 mmol/L (ref 3.5–5.1)
Sodium: 140 mmol/L (ref 135–145)
Total Bilirubin: 0.7 mg/dL (ref 0.3–1.2)
Total Protein: 5.7 g/dL — ABNORMAL LOW (ref 6.5–8.1)

## 2019-02-16 LAB — MAGNESIUM: Magnesium: 2.2 mg/dL (ref 1.7–2.4)

## 2019-02-16 LAB — PHOSPHORUS: Phosphorus: 2.5 mg/dL (ref 2.5–4.6)

## 2019-02-16 LAB — CULTURE, BLOOD (ROUTINE X 2): Special Requests: ADEQUATE

## 2019-02-16 LAB — C-REACTIVE PROTEIN: CRP: 2.5 mg/dL — ABNORMAL HIGH (ref <1.0)

## 2019-02-16 LAB — D-DIMER, QUANTITATIVE: D-Dimer, Quant: 1.57 ug/mL-FEU — ABNORMAL HIGH (ref 0.00–0.50)

## 2019-02-16 LAB — GLUCOSE, CAPILLARY
Glucose-Capillary: 149 mg/dL — ABNORMAL HIGH (ref 70–99)
Glucose-Capillary: 270 mg/dL — ABNORMAL HIGH (ref 70–99)
Glucose-Capillary: 152 mg/dL — ABNORMAL HIGH (ref 70–99)
Glucose-Capillary: 204 mg/dL — ABNORMAL HIGH (ref 70–99)

## 2019-02-16 LAB — FERRITIN: Ferritin: 169 ng/mL (ref 24–336)

## 2019-02-16 MED ORDER — IPRATROPIUM-ALBUTEROL 20-100 MCG/ACT IN AERS
2.0000 | INHALATION_SPRAY | Freq: Four times a day (QID) | RESPIRATORY_TRACT | Status: DC
  Administered 2019-02-16 – 2019-02-18 (×8): 2 via RESPIRATORY_TRACT
  Filled 2019-02-16: qty 4

## 2019-02-16 MED ORDER — VANCOMYCIN HCL 10 G IV SOLR
2000.0000 mg | Freq: Once | INTRAVENOUS | Status: AC
  Administered 2019-02-16: 2000 mg via INTRAVENOUS
  Filled 2019-02-16: qty 2000

## 2019-02-16 MED ORDER — ALPRAZOLAM 0.5 MG PO TABS
0.5000 mg | ORAL_TABLET | Freq: Three times a day (TID) | ORAL | Status: DC | PRN
  Administered 2019-02-16 – 2019-02-26 (×11): 0.5 mg via ORAL
  Filled 2019-02-16 (×11): qty 1

## 2019-02-16 MED ORDER — ALPRAZOLAM 0.5 MG PO TABS
1.0000 mg | ORAL_TABLET | Freq: Once | ORAL | Status: AC
  Administered 2019-02-16: 1 mg via ORAL
  Filled 2019-02-16: qty 2

## 2019-02-16 MED ORDER — VANCOMYCIN HCL 10 G IV SOLR
1500.0000 mg | INTRAVENOUS | Status: DC
  Administered 2019-02-17: 11:00:00 1500 mg via INTRAVENOUS
  Filled 2019-02-16: qty 1500

## 2019-02-16 NOTE — Progress Notes (Signed)
Pharmacy Antibiotic Note  Clayton Sawyer is a 78 y.o. male admitted on 02/24/2019 with COVID 19.  Pharmacy has been consulted for Vancomycin dosing. MD adding Vanc as CONS in 1/4 [facility] Bcx and now GPC in 1/2 from here - MRSA PCR negative so both could be contaminants. No fever and WBC only slightly elevated.  Plan: Vancomycin 2000mg  IV now then 1500 mg IV Q 24 hrs. Goal AUC 400-550. Expected AUC: 470 SCr used: 1.2, Vd coefficient: 0.5 Will f/u renal function, micro data, and pt's clinical condition Vanc levels prn   Height: 5\' 9"  (175.3 cm) Weight: 233 lb 0.4 oz (105.7 kg) IBW/kg (Calculated) : 70.7  Temp (24hrs), Avg:97.7 F (36.5 C), Min:97.4 F (36.3 C), Max:98.3 F (36.8 C)  Recent Labs  Lab 02/22/2019 0200 02/07/2019 0524 02/14/19 1855 02/15/19 0530 02/16/19 0500  WBC 9.9  --  14.9* 13.2* 13.9*  CREATININE 1.10  --   --  1.07 1.20  LATICACIDVEN 1.9 1.6  --  1.9  --     Estimated Creatinine Clearance: 60.8 mL/min (by C-G formula based on SCr of 1.2 mg/dL).    Allergies  Allergen Reactions  . Lisinopril Cough  . Losartan Rash  . Statins Other (See Comments)    Body aches    Antimicrobials this admission: 7/15 Azith/CTX x1 ([facility]) 7/15 Remdesivir >> 7/19 7/15 Actemra x1 ([place]) 7/18 Vanc >>  Dose adjustments this admission: N/A  Microbiology results: 7/15 SARS Coronavirus 2: positive 7/15 BCx [facility]): 1/4 bottles CONS +mec A - likely contaminant 7/16 BCx: GPC in 1/2 7/15 MRSA PCR: neg  Thank you for allowing pharmacy to be a part of this patient's care.  Sherlon Handing, PharmD, BCPS Clinical pharmacist 02/16/2019 9:30 AM

## 2019-02-16 NOTE — Progress Notes (Signed)
PROGRESS NOTE                                                                                                                                                                                                             Patient Demographics:    Clayton Sawyer, is a 78 y.o. male, DOB - [birth year], GYB:[id number]  Admit date - 02/23/2019   Admitting Physician Courage Denton Brick, MD  Outpatient Primary MD for the patient is Jodi Marble, MD  LOS - 3   No chief complaint on file.      Brief Narrative    78 y.o. male with history of  hypertension, prostate cancer, history of prior stroke  Presents from [facility] with shortness of breath, hypoxic, COVID-19 positive, wife was recently discharged COVID-19 infection, admitted to ICU for  management of heated high flow nasal cannula.   Subjective:    Clayton Sawyer today ports he is more exhausted, denies any chest pain, nausea or vomiting, reports he has been compliant with proning .    Assessment  & Plan :    Principal Problem:   Acute respiratory disease due to COVID-19 virus Active Problems:   Presence of cardiac pacemaker   History of stroke   Essential (primary) hypertension   Atherosclerotic heart disease of native coronary artery without angina pectoris/PCI stents 09/2004, 03/2009 and 10/2011   Bil Pneumonia due to COVID-19 virus   Acute hypoxic respiratory failure due to COVID-19 for pneumonia  FiO2 (%):  [55 %-100 %] 80 %  IV Solu-Medrol. IV Remdesivir Actemra received at [facility] 7/15 - research drug - d1 complete 7/16 . Pending - D14, D21, D28 (can be done as opd if dishcarged earlier)  -He remains significantly hypoxic today, actually had increased oxygen requirement, he is on 4 L high flow nasal cannula, 80% FiO2. -Was encouraged to prone and use incentive spirometry -Continue to follow inflammatory markers closely -Continue with zinc and vitamin C   COVID-19 Labs  Recent Labs   02/14/19 1855 02/15/19 0530 02/16/19 0500  DDIMER 2.23* 1.90* 1.57*  FERRITIN 209 170 169  CRP  --  5.1* 2.5*    Lab Results  Component Value Date   SARSCOV2NAA POSITIVE (A) 02/01/2019   SARSCOV2NAA Detected (A) 02/08/2019    Positive blood culture -Patient had 1/4  positive blood culture and [place] ED  at 7/15, repeat 1/4 blood culture on 7/16 as well growing gram-positive cocci, but it does appear to be samples likely contaminant, especially with the first 1 being staph coag negative, will await further specification of second positive blood cultures while covered empirically with vancomycin, if it is a contaminant will DC antibiotics, discussed with ID.  H/O CVA - stable, patient is apparently intolerant to statins, continue Plavix 75 mg daily  HTN -stable, continue Toprol-XL 25 mg daily  H/o CAD - prior PCI/stent on 09/2004, 03/2009 on 10/2011-double, no ACS type symptoms, continue Toprol-XL 25 mg daily and Plavix 35 mg daily, patient is apparently intolerant to statins  History of chronotropic incompetence/status post pacemaker placement -stable at this time   Code Status : Full Code  Family Communication  : Family been called and updated by pulmonary  Disposition Plan  : pending further work up  Barriers For Discharge : On high oxygen requirement, IV steroids, and Remdesivir  Consults  :  PCCM  Procedures  : None  DVT Prophylaxis  :  Lovenox  Lab Results  Component Value Date   PLT 296 02/16/2019    Antibiotics  :    Anti-infectives (From admission, onward)   Start     Dose/Rate Route Frequency Ordered Stop   02/17/19 1000  vancomycin (VANCOCIN) 1,500 mg in sodium chloride 0.9 % 500 mL IVPB     1,500 mg 250 mL/hr over 120 Minutes Intravenous Every 24 hours 02/16/19 0934     02/16/19 1000  vancomycin (VANCOCIN) 2,000 mg in sodium chloride 0.9 % 500 mL IVPB     2,000 mg 250 mL/hr over 120 Minutes Intravenous  Once 02/16/19 0934     02/14/19 0700   remdesivir 100 mg in sodium chloride 0.9 % 250 mL IVPB     100 mg 500 mL/hr over 30 Minutes Intravenous Every 24 hours 02/14/19 0010 02/18/19 0659        Objective:   Vitals:   02/16/19 0500 02/16/19 0600 02/16/19 0815 02/16/19 0841  BP: (!) 86/45 (!) 101/53    Pulse: 85 (!) 52    Resp: (!) 30 (!) 26    Temp:    (!) 97.5 F (36.4 C)  TempSrc:    Oral  SpO2: 93% 92% (!) 83%   Weight:      Height:        Wt Readings from Last 3 Encounters:  02/01/2019 105.7 kg  02/04/2019 106.1 kg  07/16/18 108.5 kg     Intake/Output Summary (Last 24 hours) at 02/16/2019 1148 Last data filed at 02/16/2019 0300 Gross per 24 hour  Intake 694.99 ml  Output 1600 ml  Net -905.01 ml     Physical Exam  Awake Alert, Oriented X 3, currently on prone position Symmetrical Chest wall movement, Good air movement bilaterally, CTAB RRR,No Gallops,Rubs or new Murmurs, No Parasternal Heave +ve B.Sounds, Abd Soft, No tenderness, No rebound - guarding or rigidity. No Cyanosis, Clubbing or edema, No new Rash or bruise        Data Review:    CBC Recent Labs  Lab 02/02/2019 0200 02/14/19 1855 02/15/19 0530 02/16/19 0500  WBC 9.9 14.9* 13.2* 13.9*  HGB 15.0 15.6 14.5 14.7  HCT 44.9 47.9 43.2 44.8  PLT 208 332 280 296  MCV 90.2 90.2 90.0 89.6  MCH 30.1 29.4 30.2 29.4  MCHC 33.4 32.6 33.6 32.8  RDW 14.1 14.1 14.1 14.0  LYMPHSABS 0.6*  --   --   --  MONOABS 0.6  --   --   --   EOSABS 0.0  --   --   --   BASOSABS 0.0  --   --   --     Chemistries  Recent Labs  Lab 02/21/2019 0200 02/15/19 0530 02/16/19 0500  NA 135 140 140  K 3.9 4.2 4.4  CL 99 105 102  CO2 22 23 25   GLUCOSE 139* 155* 151*  BUN 18 30* 36*  CREATININE 1.10 1.07 1.20  CALCIUM 8.0* 8.6* 8.6*  MG  --  1.9 2.2  AST 32 37 36  ALT 16 20 22   ALKPHOS 69 58 65  BILITOT 1.0 0.5 0.7   ------------------------------------------------------------------------------------------------------------------ No results for input(s):  CHOL, HDL, LDLCALC, TRIG, CHOLHDL, LDLDIRECT in the last 72 hours.  Lab Results  Component Value Date   HGBA1C 5.9 09/22/2012   ------------------------------------------------------------------------------------------------------------------ No results for input(s): TSH, T4TOTAL, T3FREE, THYROIDAB in the last 72 hours.  Invalid input(s): FREET3 ------------------------------------------------------------------------------------------------------------------ Recent Labs    02/15/19 0530 02/16/19 0500  FERRITIN 170 169    Coagulation profile No results for input(s): INR, PROTIME in the last 168 hours.  Recent Labs    02/15/19 0530 02/16/19 0500  DDIMER 1.90* 1.57*    Cardiac Enzymes No results for input(s): CKMB, TROPONINI, MYOGLOBIN in the last 168 hours.  Invalid input(s): CK ------------------------------------------------------------------------------------------------------------------ No results found for: BNP  Inpatient Medications  Scheduled Meds:  Chlorhexidine Gluconate Cloth  6 each Topical Daily   clopidogrel  75 mg Oral Daily   enoxaparin (LOVENOX) injection  40 mg Subcutaneous Q12H   feeding supplement (ENSURE ENLIVE)  237 mL Oral TID BM   insulin aspart  0-20 Units Subcutaneous TID WC   insulin aspart  0-5 Units Subcutaneous QHS   methylPREDNISolone sodium succinate  60 mg Intravenous Q12H   metoprolol succinate  25 mg Oral Daily   pantoprazole  20 mg Oral Daily   polyethylene glycol  17 g Oral BID   sodium chloride flush  10-40 mL Intracatheter Q12H   sodium chloride flush  3 mL Intravenous Q12H   pamrevlumab or placebo  35 mg/kg Intravenous Q7 days   Followed by   Derrill Memo ON 03/14/2019] pamrevlumab or placebo  35 mg/kg Intravenous Once   ascorbic acid  500 mg Oral Daily   zinc sulfate  220 mg Oral Daily   Continuous Infusions:  sodium chloride     remdesivir 100 mg in NS 250 mL 100 mg (02/16/19 0641)   [START ON 02/17/2019]  vancomycin     vancomycin 2,000 mg (02/16/19 1045)   PRN Meds:.sodium chloride, acetaminophen **OR** acetaminophen, albuterol, ALPRAZolam, hydrALAZINE, lip balm, nitroGLYCERIN, ondansetron **OR** ondansetron (ZOFRAN) IV, polyethylene glycol, sodium chloride flush, sodium chloride flush, traZODone  Micro Results Recent Results (from the past 240 hour(s))  Novel Coronavirus, NAA ([facility])     Status: Abnormal   Collection Time: 02/08/19  9:29 AM  Result Value Ref Range Status   SARS-CoV-2, NAA Detected (A) Not Detected Final    Comment: Testing was performed using the cobas(R) SARS-CoV-2 test. This test was developed and its performance characteristics determined by [employer]. This test has not been FDA cleared or approved. This test has been authorized by FDA under an Emergency Use Authorization (EUA). This test is only authorized for the duration of time the declaration that circumstances exist justifying the authorization of the emergency use of in vitro diagnostic tests for detection of SARS-CoV-2 virus and/or diagnosis of COVID-19 infection under section  564(b)(1) of the Act, 21 U.S.C. 161WRU-0(A)(5), unless the authorization is terminated or revoked sooner. When diagnostic testing is negative, the possibility of a false negative result should be considered in the context of a patient's recent exposures and the presence of clinical signs and symptoms consistent with COVID-19. An individual without symptoms of COVID-19 and who is not shedding SARS-CoV-2 virus would expect to have a negati ve (not detected) result in this assay.   SARS Coronavirus 2 [facility] order, Performed in [place] hospital lab)     Status: Abnormal   Collection Time: 02/08/2019  2:00 AM   Specimen: Nasopharyngeal Swab  Result Value Ref Range Status   SARS Coronavirus 2 POSITIVE (A) NEGATIVE Final    Comment: CRITICAL RESULT CALLED TO, READ BACK BY AND VERIFIED WITH: AMY SMITH @333  02/18/2019 TTG   (NOTE) If result is NEGATIVE SARS-CoV-2 target nucleic acids are NOT DETECTED. The SARS-CoV-2 RNA is generally detectable in upper and lower  respiratory specimens during the acute phase of infection. The lowest  concentration of SARS-CoV-2 viral copies this assay can detect is 250  copies / mL. A negative result does not preclude SARS-CoV-2 infection  and should not be used as the sole basis for treatment or other  patient management decisions.  A negative result may occur with  improper specimen collection / handling, submission of specimen other  than nasopharyngeal swab, presence of viral mutation(s) within the  areas targeted by this assay, and inadequate number of viral copies  (<250 copies / mL). A negative result must be combined with clinical  observations, patient history, and epidemiological information. If result is POSITIVE SARS-CoV-2 target nucleic acids are DETECTED.  The SARS-CoV-2 RNA is generally detectable in upper and lower  respiratory specimens during the acute phase of infection.  Positive  results are indicative of active infection with SARS-CoV-2.  Clinical  correlation with patient history and other diagnostic information is  necessary to determine patient infection status.  Positive results do  not rule out bacterial infection or co-infection with other viruses. If result is PRESUMPTIVE POSTIVE SARS-CoV-2 nucleic acids MAY BE PRESENT.   A presumptive positive result was obtained on the submitted specimen  and confirmed on repeat testing.  While 2019 novel coronavirus  (SARS-CoV-2) nucleic acids may be present in the submitted sample  additional confirmatory testing may be necessary for epidemiological  and / or clinical management purposes  to differentiate between  SARS-CoV-2 and other Sarbecovirus currently known to infect humans.  If clinically indicated additional testing with an alternate test  methodology [phone])  is advised. The SARS-CoV-2 RNA is  generally  detectable in upper and lower respiratory specimens during the acute  phase of infection. The expected result is Negative. Fact Sheet for Patients:  StrictlyIdeas.no Fact Sheet for Healthcare Providers: BankingDealers.co.za This test is not yet approved or cleared by the [place] FDA and has been authorized for detection and/or diagnosis of SARS-CoV-2 by FDA under an Emergency Use Authorization (EUA).  This EUA will remain in effect (meaning this test can be used) for the duration of the COVID-19 declaration under Section 564(b)(1) of the Act, 21 U.S.C. section 360bbb-3(b)(1), unless the authorization is terminated or revoked sooner. Performed at [facility], [address] [place], [place] [address]   Blood Culture (routine x 2)     Status: None (Preliminary result)   Collection Time: 02/05/2019  2:01 AM   Specimen: BLOOD  Result Value Ref Range Status   Specimen Description BLOOD  LEFT ASSIST CONTROL  Final   Special Requests   Final    BOTTLES DRAWN AEROBIC AND ANAEROBIC Blood Culture results may not be optimal due to an excessive volume of blood received in culture bottles   Culture   Final    NO GROWTH 3 DAYS Performed at [facility], [address] [place], [place] [address]    Report Status PENDING  Incomplete  Blood Culture (routine x 2)     Status: Abnormal   Collection Time: 02/07/2019  3:44 AM   Specimen: BLOOD  Result Value Ref Range Status   Specimen Description   Final    BLOOD RIGHT ANTECUBITAL Performed at [facility], [place] [address], [place], [place] [address]    Special Requests   Final    BOTTLES DRAWN AEROBIC AND ANAEROBIC Blood Culture adequate volume Performed at [facility], [address] [place], [place] [address]    Culture  Setup Time   Final    GRAM POSITIVE COCCI AEROBIC BOTTLE ONLY CRITICAL RESULT CALLED TO, READ BACK BY AND VERIFIED WITH: [name]  CHAPPELL ON 02/14/2019 AT 11:26 [place].    Culture (A)  Final    STAPHYLOCOCCUS SPECIES (COAGULASE NEGATIVE) THE SIGNIFICANCE OF ISOLATING THIS ORGANISM FROM A SINGLE SET OF BLOOD CULTURES WHEN MULTIPLE SETS ARE DRAWN IS UNCERTAIN. PLEASE NOTIFY THE MICROBIOLOGY DEPARTMENT WITHIN ONE WEEK IF SPECIATION AND SENSITIVITIES ARE REQUIRED. Performed at [facility], [place] [address], [place], [place] [address]    Report Status 02/16/2019 FINAL  Final  Blood Culture ID Panel (Reflexed)     Status: Abnormal   Collection Time: 02/12/2019  3:44 AM  Result Value Ref Range Status   Enterococcus species NOT DETECTED NOT DETECTED Final   Listeria monocytogenes NOT DETECTED NOT DETECTED Final   Staphylococcus species DETECTED (A) NOT DETECTED Final    Comment: Methicillin (oxacillin) resistant coagulase negative staphylococcus. Possible blood culture contaminant (unless isolated from more than one blood culture draw or clinical case suggests pathogenicity). No antibiotic treatment is indicated for blood  culture contaminants. CRITICAL RESULT CALLED TO, READ BACK BY AND VERIFIED WITH:  [name] CHAPPELL ON 02/14/2019 AT 11:26. TIK    Staphylococcus aureus (BCID) NOT DETECTED NOT DETECTED Final   Methicillin resistance DETECTED (A) NOT DETECTED Final    Comment: CRITICAL RESULT CALLED TO, READ BACK BY AND VERIFIED WITH: [name] CHAPPELL ON 02/14/2019 AT 11:26. TIK    Streptococcus species NOT DETECTED NOT DETECTED Final   Streptococcus agalactiae NOT DETECTED NOT DETECTED Final   Streptococcus pneumoniae NOT DETECTED NOT DETECTED Final   Streptococcus pyogenes NOT DETECTED NOT DETECTED Final   Acinetobacter baumannii NOT DETECTED NOT DETECTED Final   Enterobacteriaceae species NOT DETECTED NOT DETECTED Final   Enterobacter cloacae complex NOT DETECTED NOT DETECTED Final   Escherichia coli NOT DETECTED NOT DETECTED Final   Klebsiella oxytoca NOT DETECTED NOT DETECTED Final   Klebsiella pneumoniae NOT DETECTED  NOT DETECTED Final   Proteus species NOT DETECTED NOT DETECTED Final   Serratia marcescens NOT DETECTED NOT DETECTED Final   Haemophilus influenzae NOT DETECTED NOT DETECTED Final   Neisseria meningitidis NOT DETECTED NOT DETECTED Final   Pseudomonas aeruginosa NOT DETECTED NOT DETECTED Final   Candida albicans NOT DETECTED NOT DETECTED Final   Candida glabrata NOT DETECTED NOT DETECTED Final   Candida krusei NOT DETECTED NOT DETECTED Final   Candida parapsilosis NOT DETECTED NOT DETECTED Final   Candida tropicalis NOT DETECTED NOT DETECTED Final  Comment: Performed at [facility], [address] [place], [place] [address]  MRSA PCR Screening     Status: None   Collection Time: 02/03/2019  4:51 AM   Specimen: Nasopharyngeal  Result Value Ref Range Status   MRSA by PCR NEGATIVE NEGATIVE Final    Comment:        The GeneXpert MRSA Assay (FDA approved for NASAL specimens only), is one component of a comprehensive MRSA colonization surveillance program. It is not intended to diagnose MRSA infection nor to guide or monitor treatment for MRSA infections. Performed at [facility], [address] [place], [place] [address]   Culture, blood (routine x 2)     Status: None (Preliminary result)   Collection Time: 02/14/19  6:50 PM   Specimen: BLOOD RIGHT HAND  Result Value Ref Range Status   Specimen Description   Final    BLOOD RIGHT HAND Performed at [place] [address], [place], [place] [address]    Special Requests   Final    BOTTLES DRAWN AEROBIC ONLY Blood Culture adequate volume Performed at [place] [address] [place], [place] [address]    Culture  Setup Time   Final    AEROBIC BOTTLE ONLY GRAM POSITIVE COCCI CRITICAL VALUE NOTED.  VALUE IS CONSISTENT WITH PREVIOUSLY REPORTED AND CALLED VALUE. Performed at [facility], [place] [address], [place], [place] [address]    Culture GRAM POSITIVE  COCCI  Final   Report Status PENDING  Incomplete  Culture, blood (routine x 2)     Status: None (Preliminary result)   Collection Time: 02/14/19  6:55 PM   Specimen: BLOOD RIGHT FOREARM  Result Value Ref Range Status   Specimen Description   Final    BLOOD RIGHT FOREARM Performed at [facility], [place] [address], [place], [place] [address]    Special Requests   Final    BOTTLES DRAWN AEROBIC ONLY Blood Culture results may not be optimal due to an excessive volume of blood received in culture bottles Performed at [place] [address] [place], [place] [address]    Culture   Final    NO GROWTH < 24 HOURS Performed at [place] [address], [place], [place] [address]    Report Status PENDING  Incomplete    Radiology Reports Dg Chest Port 1 View  Result Date: 02/16/2019 CLINICAL DATA:  78 year old male currently admitted with COVID-19 viral pneumonia EXAM: PORTABLE CHEST 1 VIEW COMPARISON:  Prior chest x-ray 02/14/2019 FINDINGS: Right upper extremity subclavian approach cardiac rhythm maintenance device remains in stable position with leads projecting over the right atrium and right ventricle. The patient is not intubated. Interval placement of a left upper extremity PICC. The catheter tip overlies the cavoatrial junction. Perhaps slightly improved inspiratory volumes with improving aeration in the right mid and lower lung. Persistent patchy airspace opacities in both lower lobes and the left mid lung. No pneumothorax. No significant pleural effusion. No acute osseous abnormality. IMPRESSION: 1. Interval placement of a left upper extremity PICC. The catheter tip overlies the superior cavoatrial junction. 2. Slightly improved inspiratory volumes and improved aeration in the right mid and lower lung. 3. Persistent multifocal patchy airspace opacities most notable in the left mid lung and bilateral bases. Electronically Signed   By: Jacqulynn Cadet M.D.    On: 02/16/2019 10:15   Portable Chest 1 View  Result Date: 02/14/2019 CLINICAL DATA:  Dyspnea EXAM: PORTABLE CHEST 1 VIEW COMPARISON:  02/17/2019 FINDINGS: Cardiac shadow is stable. Pacing device is again seen. Patchy parenchymal opacities are noted primarily within the right lung but to a lesser degree in the left lung base slightly progressed when compare with the prior study. No bony abnormality is noted. IMPRESSION: Slight progression of parenchymal opacities particularly on the right. Electronically Signed   By: Inez Catalina M.D.   On: 02/14/2019 09:04   Dg Chest Port 1 View  Result Date: 02/07/2019 CLINICAL DATA:  Cough diarrhea nausea and fever EXAM: PORTABLE CHEST 1 VIEW COMPARISON:  04/11/2016 FINDINGS: Right-sided pacing device as before. Patchy bilateral somewhat peripheral airspace opacities. No pleural effusion. Normal heart size. No pneumothorax. IMPRESSION: Low lung volumes. Patchy bilateral ill-defined airspace opacities, concerning for pneumonia. Consider atypical or viral pneumonia in the appropriate clinical setting. Electronically Signed   By: Donavan Foil M.D.   On: 02/09/2019 02:31   [place] Ekg Site Rite  Result Date: 02/14/2019 If Site Rite image not attached, placement could not be confirmed due to current cardiac rhythm.     Phillips Climes M.D on 02/16/2019 at 11:48 AM  Between 7am to 7pm - Pager - [phone]  After 7pm go to [email or web address] - password [facility]  [facility] -  Office  [phone]

## 2019-02-16 NOTE — Progress Notes (Signed)
I provided update to patients wife

## 2019-02-16 NOTE — Progress Notes (Addendum)
Name: Clayton Sawyer MRN: [id number] DOB: [birth year]    ADMISSION DATE:  01/30/2019 CONSULTATION DATE:  02/11/2019  REFERRING MD :  Dr. Marcille Blanco  CHIEF COMPLAINT:  Cough  BRIEF PATIENT DESCRIPTION:  Clayton Sawyer is a 78 year old male with a past medical history as listed below who who presents to [facility] ED on 03/01/2019 with complaints of progressive tachypnea, cough, generalized weakness, nausea, diarrhea, and fever.  Of note the patient's wife is currently hospitalized at [facility] secondary to COVID-19 infection.  He reports that he was tested on Friday but has not yet received his results.  Upon presentation to the ED he was noted to be hypoxic with O2 sats 84% on room air and tachypnea with respiratory rate of 24.  Given his work of breathing and hypoxia he was placed on nonrebreather and given 10 mg Decadron IV. Initial work-up in the ED revealed WBC 9.9, lymphocytopenia, glucose 139, LDH 384, triglycerides 156, ferritin 146, fibrinogen 748, fibrin derivatives 1244, lactic acid 1.9, procalcitonin 0.35.  His SARS-CoV-2 PCR is positive in the ED. Chest x-ray reveals patchy bilateral opacities concerning for pneumonia.  Currently [facility] has no bed availability, therefore he is admitted to stepdown unit for further work-up and treatment of acute hypoxic respiratory failure in the setting of COVID-19 infection and questionable secondary pneumonia.  PCCM is consulted for further management   has a past medical history of Abdominal hernia without obstruction or gangrene (05/22/2014), Atherosclerotic heart disease of native coronary artery without angina pectoris (07/22/2011), Calculus of kidney (09/26/2011), Chronotropic incompetence (72/04/4708), Diastolic dysfunction (62/83/6629), Essential (primary) hypertension (07/22/2011), History of stroke (09/26/2011), Hyperlipidemia (07/22/2011), Hypertension, Malignant neoplasm of prostate ([place]) (03/04/2013), MI, old, Migraine without status migrainosus, not  intractable (05/22/2014), Osteoarthritis (09/26/2011), Presence of cardiac pacemaker (07/21/2011), and Varicella without complication (47/65/4650).   SIGNIFICANT EVENTS  7/15>>Admission to Stepdown 7/15>> COVID-19 PCR Positive 7/16 On [facility]NC . Says tummy time helps.Had breakfast.. Left Picc. S/p research drug [email or web address]  7/17 - s/p research drug yesterday. Tis AM - hypoxemia better 45L/100% to 35L/65%. Also did prone. Feeling better. Currently pulse ox 89%. Taste is comin back. CRP much better; Marland Kitchen D-dimer better. Walked around in bed   STUDIES:  N/A  CULTURES: SARS-CoV-2 PCR 7/15>> positive Blood x2 7/15>> Coag neg staph on BCID MRSA PCR 7/15 - negative Strep pneumo urinary antigen 7/15>> neg Legionella urinary antigen 7/15>>  ANTIBIOTICS: Tocilizumab 7/15  [facility] ER  Remdesivir 7/15>> Solumedrol 02/14/2019 >> Research Drug -  [email or web address]  (CT[facility] Mab Pamrevlumab) v Placebo 02/14/2019 >>   SUBJECTIVE:    7/18 -he feels he is somewhat declined.  He is currently on 35 L high flow nasal cannula at 80% FiO2 which is slightly worse than yesterday.  He does have some anxiety.  No fever.  Review of labs indicate improvement in CRP, ferritin, chest x-ray and d-dimer.  VITAL SIGNS: Temp:  [97.4 F (36.3 C)-97.7 F (36.5 C)] 97.5 F (36.4 C) (07/18 0841) Pulse Rate:  [52-103] 52 (07/18 0600) Resp:  [20-39] 26 (07/18 0600) BP: (86-138)/(45-110) 101/53 (07/18 0600) SpO2:  [83 %-96 %] 83 % (07/18 0815) FiO2 (%):  [55 %-100 %] 80 % (07/18 0815)    General Appearance:  Oveerweight.PRONE NI BED Head:  Normocephalic, without obvious abnormality, atraumatic Eyes:  PERRL - yes, conjunctiva/corneas - cler     Ears:  Normal external ear canals, both ears Nose:  G tube - no bu thas HHFNC Throat:  ETT TUBE - no , OG tube -  no Neck:  Supple,  No enlargement/tenderness/nodules Lungs: Clear to auscultation bilaterally,  Heart:  S1 and S2 normal, no murmur, CVP - no.  Pressors - no  Abdomen:  Soft, no masses, no organomegaly Genitalia / Rectal:  Not done Extremities:  Extremities- intact Skin:  ntact in exposed areas .  Neurologic:  Sedation - none -> RASS - +1 . Moves all 4s - yes. CAM-ICU - neg . Orientation - x3+         LABS    Results for Clayton Sawyer, Clayton Sawyer (MRN [id number]) as of 02/15/2019 11:27  Ref. Range 03/01/2019 02:00 02/14/2019 05:24 02/15/2019 05:30  CRP Latest Ref Range: <1.0 mg/dL 18.5 (H) 18.5 (H) 5.1 (H)  Results for Clayton Sawyer, Clayton Sawyer (MRN [id number]) as of 02/15/2019 11:27  Ref. Range 02/14/2019 18:55 02/15/2019 05:30  D-Dimer, Quant Latest Ref Range: 0.00 - 0.50 ug/mL-FEU 2.23 (H) 1.90 (H)    PULMONARY No results for input(s): PHART, PCO2ART, PO2ART, HCO3, TCO2, O2SAT in the last 168 hours.  Invalid input(s): PCO2, PO2  CBC Recent Labs  Lab 02/14/19 1855 02/15/19 0530 02/16/19 0500  HGB 15.6 14.5 14.7  HCT 47.9 43.2 44.8  WBC 14.9* 13.2* 13.9*  PLT 332 280 296    COAGULATION No results for input(s): INR in the last 168 hours.  CARDIAC  No results for input(s): TROPONINI in the last 168 hours. No results for input(s): PROBNP in the last 168 hours.   CHEMISTRY Recent Labs  Lab 02/18/2019 0200 02/15/19 0530 02/16/19 0500  NA 135 140 140  K 3.9 4.2 4.4  CL 99 105 102  CO2 22 23 25   GLUCOSE 139* 155* 151*  BUN 18 30* 36*  CREATININE 1.10 1.07 1.20  CALCIUM 8.0* 8.6* 8.6*  MG  --  1.9 2.2  PHOS  --  3.3 2.5   Estimated Creatinine Clearance: 60.8 mL/min (by C-G formula based on SCr of 1.2 mg/dL).   LIVER Recent Labs  Lab 02/23/2019 0200 02/15/19 0530 02/16/19 0500  AST 32 37 36  ALT 16 20 22   ALKPHOS 69 58 65  BILITOT 1.0 0.5 0.7  PROT 6.8 5.9* 5.7*  ALBUMIN 3.4* 2.7* 2.9*     INFECTIOUS Recent Labs  Lab 02/12/2019 0200 02/23/2019 0524 02/15/19 0530  LATICACIDVEN 1.9 1.6 1.9  PROCALCITON 0.35  --   --      ENDOCRINE CBG (last 3)  Recent Labs    02/15/19 2102 02/16/19 0821 02/16/19 1221  GLUCAP 197*  149* 270*         IMAGING x48h  - image(s) personally visualized  -   highlighted in bold Dg Chest Port 1 View  Result Date: 02/16/2019 CLINICAL DATA:  78 year old male currently admitted with COVID-19 viral pneumonia EXAM: PORTABLE CHEST 1 VIEW COMPARISON:  Prior chest x-ray 02/14/2019 FINDINGS: Right upper extremity subclavian approach cardiac rhythm maintenance device remains in stable position with leads projecting over the right atrium and right ventricle. The patient is not intubated. Interval placement of a left upper extremity PICC. The catheter tip overlies the cavoatrial junction. Perhaps slightly improved inspiratory volumes with improving aeration in the right mid and lower lung. Persistent patchy airspace opacities in both lower lobes and the left mid lung. No pneumothorax. No significant pleural effusion. No acute osseous abnormality. IMPRESSION: 1. Interval placement of a left upper extremity PICC. The catheter tip overlies the superior cavoatrial junction. 2. Slightly improved inspiratory volumes and improved aeration in the right mid and lower lung. 3. Persistent multifocal patchy  airspace opacities most notable in the left mid lung and bilateral bases. Electronically Signed   By: Jacqulynn Cadet M.D.   On: 02/16/2019 10:15        ASSESSMENT / PLAN:  Acute Hypoxic Respiratory Failure secondary to COVID-19 infection & ? Pneumonia  -Remdesivir - Steroids -  research drug - d1 complete 7/16    02/16/2019 - > happy severely hypoxemic - slightly worse but biomarkers better  PLAN\ -Supplemental O2 as needed to maintain O2 sats>88% -IV Solu-Medrol daily per triad -Remdesivir per triad - research drug -  Pending - D14, D21, D28 (can be done as opd if dishcarged earlier)  -Maintain Euvovolemia as able - combivent mdi q6h at request for grandaughter   Coagulase Neg Staph In 1 of 4 blood culture 7/15  - empiric vanc because he has pacer    Family updates:Grandaughte  Greggory Stallion updated -she asked about convalescent plasma  - program no longer available at [facility]  + explained therapeutic misconception + ineligibility to participate in plasma protocol because he is already on Fibrogen trial  Discussed code status -> grandaughter says not to talk to patient 02/16/2019 about code status due to lower mood . She says she is not sure what his wishes would be but she will talk to rest of family -> she called back 3:10 PM - full code per family  SIGNATURE    Dr. Brand Males, M.D., F.C.C.P,  Pulmonary and Critical Care Medicine Staff Physician, [place] Director - Interstitial Lung Disease  Program  Pulmonary [place] [facility] [place], [place], [address]  Pager: [phone], If no answer or between  15:00h - 7:00h: call [phone] Telephone: [phone]  1:58 PM 02/16/2019

## 2019-02-17 LAB — COMPREHENSIVE METABOLIC PANEL
ALT: 27 U/L (ref 0–44)
AST: 39 U/L (ref 15–41)
Albumin: 2.6 g/dL — ABNORMAL LOW (ref 3.5–5.0)
Alkaline Phosphatase: 60 U/L (ref 38–126)
Anion gap: 8 (ref 5–15)
BUN: 34 mg/dL — ABNORMAL HIGH (ref 8–23)
CO2: 27 mmol/L (ref 22–32)
Calcium: 8.2 mg/dL — ABNORMAL LOW (ref 8.9–10.3)
Chloride: 106 mmol/L (ref 98–111)
Creatinine, Ser: 0.99 mg/dL (ref 0.61–1.24)
GFR calc Af Amer: >60 mL/min (ref >60)
GFR calc non Af Amer: >60 mL/min (ref >60)
Glucose, Bld: 155 mg/dL — ABNORMAL HIGH (ref 70–99)
Potassium: 4.8 mmol/L (ref 3.5–5.1)
Sodium: 141 mmol/L (ref 135–145)
Total Bilirubin: 0.6 mg/dL (ref 0.3–1.2)
Total Protein: 5.1 g/dL — ABNORMAL LOW (ref 6.5–8.1)

## 2019-02-17 LAB — C-REACTIVE PROTEIN: CRP: 1 mg/dL — ABNORMAL HIGH (ref <1.0)

## 2019-02-17 LAB — LEGIONELLA PNEUMOPHILA SEROGP 1 UR AG: L. pneumophila Serogp 1 Ur Ag: NEGATIVE

## 2019-02-17 LAB — CULTURE, BLOOD (ROUTINE X 2): Special Requests: ADEQUATE

## 2019-02-17 LAB — FERRITIN: Ferritin: 122 ng/mL (ref 24–336)

## 2019-02-17 LAB — D-DIMER, QUANTITATIVE: D-Dimer, Quant: 2.21 ug/mL-FEU — ABNORMAL HIGH (ref 0.00–0.50)

## 2019-02-17 LAB — CBC
HCT: 43.2 % (ref 39.0–52.0)
Hemoglobin: 14.3 g/dL (ref 13.0–17.0)
MCH: 30.1 pg (ref 26.0–34.0)
MCHC: 33.1 g/dL (ref 30.0–36.0)
MCV: 90.9 fL (ref 80.0–100.0)
Platelets: 228 10*3/uL (ref 150–400)
RBC: 4.75 MIL/uL (ref 4.22–5.81)
RDW: 14.3 % (ref 11.5–15.5)
WBC: 12.5 10*3/uL — ABNORMAL HIGH (ref 4.0–10.5)
nRBC: 0.2 % (ref 0.0–0.2)

## 2019-02-17 LAB — GLUCOSE, CAPILLARY
Glucose-Capillary: 139 mg/dL — ABNORMAL HIGH (ref 70–99)
Glucose-Capillary: 229 mg/dL — ABNORMAL HIGH (ref 70–99)
Glucose-Capillary: 121 mg/dL — ABNORMAL HIGH (ref 70–99)
Glucose-Capillary: 141 mg/dL — ABNORMAL HIGH (ref 70–99)

## 2019-02-17 LAB — MAGNESIUM: Magnesium: 2.4 mg/dL (ref 1.7–2.4)

## 2019-02-17 LAB — PHOSPHORUS: Phosphorus: 3.5 mg/dL (ref 2.5–4.6)

## 2019-02-17 MED ORDER — TOCILIZUMAB 400 MG/20ML IV SOLN
800.0000 mg | Freq: Once | INTRAVENOUS | Status: AC
  Administered 2019-02-17: 13:00:00 800 mg via INTRAVENOUS
  Filled 2019-02-17: qty 40

## 2019-02-17 NOTE — Progress Notes (Signed)
PROGRESS NOTE                                                                                                                                                                                                             Patient Demographics:    Clayton Sawyer, is a 78 y.o. male, DOB - 11-17-1940, MGQ:[id number]  Admit date - 02/22/2019   Admitting Physician Courage Denton Brick, MD  Outpatient Primary MD for the patient is Jodi Marble, MD  LOS - 4   No chief complaint on file.      Brief Narrative    78 y.o. male with history of  hypertension, prostate cancer, history of prior stroke  Presents from [facility] with shortness of breath, hypoxic, COVID-19 positive, wife was recently discharged COVID-19 infection, admitted to ICU for  management of heated high flow nasal cannula.   Subjective:    Clayton Sawyer today reports was able to do pruning overnight, he did have increased oxygen requirement overnight, denies chest pain, reports dyspnea.    Assessment  & Plan :    Principal Problem:   Acute respiratory disease due to COVID-19 virus Active Problems:   Presence of cardiac pacemaker   History of stroke   Essential (primary) hypertension   Atherosclerotic heart disease of native coronary artery without angina pectoris/PCI stents 09/2004, 03/2009 and 10/2011   Bil Pneumonia due to COVID-19 virus   Acute hypoxic respiratory failure due to COVID-19 for pneumonia  FiO2 (%):  [80 %-100 %] 90 %  IV Solu-Medrol. IV Remdesivir Actemra received at [facility] 7/15, repeat again today - research drug - d1 complete 7/16 . Pending - D14, D21, D28 (can be done as opd if dishcarged earlier)  -[place] significantly hypoxic today, with increased oxygen requirement to 45 L high flow nasal cannula, and FiO2 of 90%, will repeat Actemra today . -Was encouraged to prone and use incentive spirometry -Continue to follow inflammatory markers closely -Continue with zinc and  vitamin C   COVID-19 Labs  Recent Labs    02/15/19 0530 02/16/19 0500 02/17/19 0500  DDIMER 1.90* 1.57* 2.21*  FERRITIN 170 169 122  CRP 5.1* 2.5* 1.0*    Lab Results  Component Value Date   SARSCOV2NAA POSITIVE (A) 02/15/2019   SARSCOV2NAA Detected (A) 02/08/2019    Positive blood culture -patient had 1/4 positive for  staph coag negative at [place], and another 1/2 bottles positive for Staphylococcus hominis, both are contaminant, will DC vancomycin .  H/O CVA - stable, patient is apparently intolerant to statins, continue Plavix 75 mg daily  HTN -stable, continue Toprol-XL 25 mg daily  H/o CAD - prior PCI/stent on 09/2004, 03/2009 on 10/2011-double, no ACS type symptoms, continue Toprol-XL 25 mg daily and Plavix 35 mg daily, patient is apparently intolerant to statins  History of chronotropic incompetence/status post pacemaker placement -stable at this time   Code Status : Full Code  Family Communication  : Family been called and updated by pulmonary  Disposition Plan  : pending further work up  Consults  :  PCCM  Procedures  : None  DVT Prophylaxis  :  Lovenox  Lab Results  Component Value Date   PLT 228 02/17/2019    Antibiotics  :    Anti-infectives (From admission, onward)   Start     Dose/Rate Route Frequency Ordered Stop   02/17/19 1000  vancomycin (VANCOCIN) 1,500 mg in sodium chloride 0.9 % 500 mL IVPB     1,500 mg 250 mL/hr over 120 Minutes Intravenous Every 24 hours 02/16/19 0934     02/16/19 1000  vancomycin (VANCOCIN) 2,000 mg in sodium chloride 0.9 % 500 mL IVPB     2,000 mg 250 mL/hr over 120 Minutes Intravenous  Once 02/16/19 0934 02/16/19 1250   02/14/19 0700  remdesivir 100 mg in sodium chloride 0.9 % 250 mL IVPB     100 mg 500 mL/hr over 30 Minutes Intravenous Every 24 hours 02/14/19 0010 02/17/19 0854        Objective:   Vitals:   02/17/19 0725 02/17/19 0800 02/17/19 1000 02/17/19 1100  BP:   (!) 131/106 92/76  Pulse:    96 (!) 109  Resp:      Temp:  98 F (36.7 C)    TempSrc:  Axillary    SpO2: 91%  90% 93%  Weight:      Height:        Wt Readings from Last 3 Encounters:  02/14/2019 105.7 kg  01/31/2019 106.1 kg  07/16/18 108.5 kg     Intake/Output Summary (Last 24 hours) at 02/17/2019 1233 Last data filed at 02/17/2019 1230 Gross per 24 hour  Intake 1030 ml  Output 700 ml  Net 330 ml     Physical Exam  Awake Alert, Oriented X 3, No new F.N deficits, Normal affect Symmetrical Chest wall movement, Good air movement bilaterally, fine scattered rails RRR,No Gallops,Rubs or new Murmurs, No Parasternal Heave +ve B.Sounds, Abd Soft, No tenderness, No rebound - guarding or rigidity. No Cyanosis, Clubbing or edema, No new Rash or bruise         Data Review:    CBC Recent Labs  Lab 02/10/2019 0200 02/14/19 1855 02/15/19 0530 02/16/19 0500 02/17/19 0500  WBC 9.9 14.9* 13.2* 13.9* 12.5*  HGB 15.0 15.6 14.5 14.7 14.3  HCT 44.9 47.9 43.2 44.8 43.2  PLT 208 332 280 296 228  MCV 90.2 90.2 90.0 89.6 90.9  MCH 30.1 29.4 30.2 29.4 30.1  MCHC 33.4 32.6 33.6 32.8 33.1  RDW 14.1 14.1 14.1 14.0 14.3  LYMPHSABS 0.6*  --   --   --   --   MONOABS 0.6  --   --   --   --   EOSABS 0.0  --   --   --   --   BASOSABS 0.0  --   --   --   --  Chemistries  Recent Labs  Lab 02/01/2019 0200 02/15/19 0530 02/16/19 0500 02/17/19 0500  NA 135 140 140 141  K 3.9 4.2 4.4 4.8  CL 99 105 102 106  CO2 22 23 25 27   GLUCOSE 139* 155* 151* 155*  BUN 18 30* 36* 34*  CREATININE 1.10 1.07 1.20 0.99  CALCIUM 8.0* 8.6* 8.6* 8.2*  MG  --  1.9 2.2 2.4  AST 32 37 36 39  ALT 16 20 22 27   ALKPHOS 69 58 65 60  BILITOT 1.0 0.5 0.7 0.6   ------------------------------------------------------------------------------------------------------------------ No results for input(s): CHOL, HDL, LDLCALC, TRIG, CHOLHDL, LDLDIRECT in the last 72 hours.  Lab Results  Component Value Date   HGBA1C 5.9 09/22/2012    ------------------------------------------------------------------------------------------------------------------ No results for input(s): TSH, T4TOTAL, T3FREE, THYROIDAB in the last 72 hours.  Invalid input(s): FREET3 ------------------------------------------------------------------------------------------------------------------ Recent Labs    02/16/19 0500 02/17/19 0500  FERRITIN 169 122    Coagulation profile No results for input(s): INR, PROTIME in the last 168 hours.  Recent Labs    02/16/19 0500 02/17/19 0500  DDIMER 1.57* 2.21*    Cardiac Enzymes No results for input(s): CKMB, TROPONINI, MYOGLOBIN in the last 168 hours.  Invalid input(s): CK ------------------------------------------------------------------------------------------------------------------ No results found for: BNP  Inpatient Medications  Scheduled Meds:  Chlorhexidine Gluconate Cloth  6 each Topical Daily   clopidogrel  75 mg Oral Daily   enoxaparin (LOVENOX) injection  40 mg Subcutaneous Q12H   feeding supplement (ENSURE ENLIVE)  237 mL Oral TID BM   insulin aspart  0-20 Units Subcutaneous TID WC   insulin aspart  0-5 Units Subcutaneous QHS   Ipratropium-Albuterol  2 puff Inhalation Q6H   methylPREDNISolone sodium succinate  60 mg Intravenous Q12H   metoprolol succinate  25 mg Oral Daily   pantoprazole  20 mg Oral Daily   polyethylene glycol  17 g Oral BID   sodium chloride flush  10-40 mL Intracatheter Q12H   sodium chloride flush  3 mL Intravenous Q12H   pamrevlumab or placebo  35 mg/kg Intravenous Q7 days   Followed by   Derrill Memo ON 03/14/2019] pamrevlumab or placebo  35 mg/kg Intravenous Once   ascorbic acid  500 mg Oral Daily   zinc sulfate  220 mg Oral Daily   Continuous Infusions:  sodium chloride     tocilizumab (ACTEMRA) IV     vancomycin 1,500 mg (02/17/19 1056)   PRN Meds:.sodium chloride, acetaminophen **OR** acetaminophen, albuterol, ALPRAZolam,  hydrALAZINE, lip balm, nitroGLYCERIN, ondansetron **OR** ondansetron (ZOFRAN) IV, polyethylene glycol, sodium chloride flush, sodium chloride flush, traZODone  Micro Results Recent Results (from the past 240 hour(s))  Novel Coronavirus, NAA ([facility])     Status: Abnormal   Collection Time: 02/08/19  9:29 AM  Result Value Ref Range Status   SARS-CoV-2, NAA Detected (A) Not Detected Final    Comment: Testing was performed using the cobas(R) SARS-CoV-2 test. This test was developed and its performance characteristics determined by [employer]. This test has not been FDA cleared or approved. This test has been authorized by FDA under an Emergency Use Authorization (EUA). This test is only authorized for the duration of time the declaration that circumstances exist justifying the authorization of the emergency use of in vitro diagnostic tests for detection of SARS-CoV-2 virus and/or diagnosis of COVID-19 infection under section 564(b)(1) of the Act, 21 U.S.C. 831DVV-6(H)(6), unless the authorization is terminated or revoked sooner. When diagnostic testing is negative, the possibility of a false negative result should be  considered in the context of a patient's recent exposures and the presence of clinical signs and symptoms consistent with COVID-19. An individual without symptoms of COVID-19 and who is not shedding SARS-CoV-2 virus would expect to have a negati ve (not detected) result in this assay.   SARS Coronavirus 2 [facility] order, Performed in [place] hospital lab)     Status: Abnormal   Collection Time: 01/30/2019  2:00 AM   Specimen: Nasopharyngeal Swab  Result Value Ref Range Status   SARS Coronavirus 2 POSITIVE (A) NEGATIVE Final    Comment: CRITICAL RESULT CALLED TO, READ BACK BY AND VERIFIED WITH: AMY SMITH @333  02/04/2019 TTG  (NOTE) If result is NEGATIVE SARS-CoV-2 target nucleic acids are NOT DETECTED. The SARS-CoV-2 RNA is generally detectable in upper and  lower  respiratory specimens during the acute phase of infection. The lowest  concentration of SARS-CoV-2 viral copies this assay can detect is 250  copies / mL. A negative result does not preclude SARS-CoV-2 infection  and should not be used as the sole basis for treatment or other  patient management decisions.  A negative result may occur with  improper specimen collection / handling, submission of specimen other  than nasopharyngeal swab, presence of viral mutation(s) within the  areas targeted by this assay, and inadequate number of viral copies  (<250 copies / mL). A negative result must be combined with clinical  observations, patient history, and epidemiological information. If result is POSITIVE SARS-CoV-2 target nucleic acids are DETECTED.  The SARS-CoV-2 RNA is generally detectable in upper and lower  respiratory specimens during the acute phase of infection.  Positive  results are indicative of active infection with SARS-CoV-2.  Clinical  correlation with patient history and other diagnostic information is  necessary to determine patient infection status.  Positive results do  not rule out bacterial infection or co-infection with other viruses. If result is PRESUMPTIVE POSTIVE SARS-CoV-2 nucleic acids MAY BE PRESENT.   A presumptive positive result was obtained on the submitted specimen  and confirmed on repeat testing.  While 2019 novel coronavirus  (SARS-CoV-2) nucleic acids may be present in the submitted sample  additional confirmatory testing may be necessary for epidemiological  and / or clinical management purposes  to differentiate between  SARS-CoV-2 and other Sarbecovirus currently known to infect humans.  If clinically indicated additional testing with an alternate test  methodology [phone])  is advised. The SARS-CoV-2 RNA is generally  detectable in upper and lower respiratory specimens during the acute  phase of infection. The expected result is  Negative. Fact Sheet for Patients:  StrictlyIdeas.no Fact Sheet for Healthcare Providers: BankingDealers.co.za This test is not yet approved or cleared by the [place] FDA and has been authorized for detection and/or diagnosis of SARS-CoV-2 by FDA under an Emergency Use Authorization (EUA).  This EUA will remain in effect (meaning this test can be used) for the duration of the COVID-19 declaration under Section 564(b)(1) of the Act, 21 U.S.C. section 360bbb-3(b)(1), unless the authorization is terminated or revoked sooner. Performed at [facility], [address], [place], [place] [address]   Blood Culture (routine x 2)     Status: None (Preliminary result)   Collection Time: 02/12/2019  2:01 AM   Specimen: BLOOD  Result Value Ref Range Status   Specimen Description   Final    BLOOD LEFT ANTECUBITAL Performed at [facility], [place] [address] [place], [place] [address]    Special Requests   Final  BOTTLES DRAWN AEROBIC AND ANAEROBIC Blood Culture results may not be optimal due to an excessive volume of blood received in culture bottles   Culture   Final    NO GROWTH 4 DAYS Performed at [facility], [address] [place], [place] [address]    Report Status PENDING  Incomplete  Blood Culture (routine x 2)     Status: Abnormal   Collection Time: 02/01/2019  3:44 AM   Specimen: BLOOD  Result Value Ref Range Status   Specimen Description   Final    BLOOD RIGHT ANTECUBITAL Performed at [facility], [place] [address], [place], [place] [address]    Special Requests   Final    BOTTLES DRAWN AEROBIC AND ANAEROBIC Blood Culture adequate volume Performed at [facility], [place] [address] [place], [place] [address]    Culture  Setup Time   Final    GRAM POSITIVE COCCI AEROBIC BOTTLE ONLY CRITICAL RESULT CALLED TO, READ BACK BY AND VERIFIED WITH: [name] CHAPPELL ON 02/14/2019 AT 11:26 [place].     Culture (A)  Final    STAPHYLOCOCCUS SPECIES (COAGULASE NEGATIVE) THE SIGNIFICANCE OF ISOLATING THIS ORGANISM FROM A SINGLE SET OF BLOOD CULTURES WHEN MULTIPLE SETS ARE DRAWN IS UNCERTAIN. PLEASE NOTIFY THE MICROBIOLOGY DEPARTMENT WITHIN ONE WEEK IF SPECIATION AND SENSITIVITIES ARE REQUIRED. Performed at [facility], [place] [address], [place], [place] [address]    Report Status 02/16/2019 FINAL  Final  Blood Culture ID Panel (Reflexed)     Status: Abnormal   Collection Time: 02/11/2019  3:44 AM  Result Value Ref Range Status   Enterococcus species NOT DETECTED NOT DETECTED Final   Listeria monocytogenes NOT DETECTED NOT DETECTED Final   Staphylococcus species DETECTED (A) NOT DETECTED Final    Comment: Methicillin (oxacillin) resistant coagulase negative staphylococcus. Possible blood culture contaminant (unless isolated from more than one blood culture draw or clinical case suggests pathogenicity). No antibiotic treatment is indicated for blood  culture contaminants. CRITICAL RESULT CALLED TO, READ BACK BY AND VERIFIED WITH:  [name] CHAPPELL ON 02/14/2019 AT 11:26. TIK    Staphylococcus aureus (BCID) NOT DETECTED NOT DETECTED Final   Methicillin resistance DETECTED (A) NOT DETECTED Final    Comment: CRITICAL RESULT CALLED TO, READ BACK BY AND VERIFIED WITH: [name] CHAPPELL ON 02/14/2019 AT 11:26. TIK    Streptococcus species NOT DETECTED NOT DETECTED Final   Streptococcus agalactiae NOT DETECTED NOT DETECTED Final   Streptococcus pneumoniae NOT DETECTED NOT DETECTED Final   Streptococcus pyogenes NOT DETECTED NOT DETECTED Final   Acinetobacter baumannii NOT DETECTED NOT DETECTED Final   Enterobacteriaceae species NOT DETECTED NOT DETECTED Final   Enterobacter cloacae complex NOT DETECTED NOT DETECTED Final   Escherichia coli NOT DETECTED NOT DETECTED Final   Klebsiella oxytoca NOT DETECTED NOT DETECTED Final   Klebsiella pneumoniae NOT DETECTED NOT DETECTED Final   Proteus species NOT  DETECTED NOT DETECTED Final   Serratia marcescens NOT DETECTED NOT DETECTED Final   Haemophilus influenzae NOT DETECTED NOT DETECTED Final   Neisseria meningitidis NOT DETECTED NOT DETECTED Final   Pseudomonas aeruginosa NOT DETECTED NOT DETECTED Final   Candida albicans NOT DETECTED NOT DETECTED Final   Candida glabrata NOT DETECTED NOT DETECTED Final   Candida krusei NOT DETECTED NOT DETECTED Final   Candida parapsilosis NOT DETECTED NOT DETECTED Final   Candida tropicalis NOT DETECTED NOT DETECTED Final    Comment: Performed at [facility], [address] [place], [place] [address]  MRSA PCR Screening     Status: None   Collection Time: 02/24/2019  4:51 AM   Specimen: Nasopharyngeal  Result Value Ref Range Status   MRSA by PCR NEGATIVE NEGATIVE Final    Comment:        The GeneXpert MRSA Assay (FDA approved for NASAL specimens only), is one component of a comprehensive MRSA colonization surveillance program. It is not intended to diagnose MRSA infection nor to guide or monitor treatment for MRSA infections. Performed at [facility], [place] [place], [place] [address]   Culture, blood (routine x 2)     Status: None (Preliminary result)   Collection Time: 02/14/19  6:50 PM   Specimen: BLOOD RIGHT HAND  Result Value Ref Range Status   Specimen Description   Final    BLOOD RIGHT HAND Performed at [place] [address], [place], [place] [address]    Special Requests   Final    BOTTLES DRAWN AEROBIC ONLY Blood Culture adequate volume Performed at [place] [address], [place], [place] [address]    Culture  Setup Time   Final    AEROBIC BOTTLE ONLY GRAM POSITIVE COCCI CRITICAL VALUE NOTED.  VALUE IS CONSISTENT WITH PREVIOUSLY REPORTED AND CALLED VALUE.    Culture   Final    GRAM POSITIVE COCCI IDENTIFICATION TO FOLLOW Performed at [facility], [place] [address] [place], [place]  [address]    Report Status PENDING  Incomplete  Culture, blood (routine x 2)     Status: None (Preliminary result)   Collection Time: 02/14/19  6:55 PM   Specimen: BLOOD RIGHT FOREARM  Result Value Ref Range Status   Specimen Description   Final    BLOOD RIGHT FOREARM Performed at [facility], [place] [address], [place], [place] [address]    Special Requests   Final    BOTTLES DRAWN AEROBIC ONLY Blood Culture results may not be optimal due to an excessive volume of blood received in culture bottles Performed at [place] [address] [place],  [address]    Culture   Final    NO GROWTH 3 DAYS Performed at [facility], [place] [address], [place],  [address]    Report Status PENDING  Incomplete    Radiology Reports Dg Chest Port 1 View  Result Date: 02/16/2019 CLINICAL DATA:  78 year old male currently admitted with COVID-19 viral pneumonia EXAM: PORTABLE CHEST 1 VIEW COMPARISON:  Prior chest x-ray 02/14/2019 FINDINGS: Right upper extremity subclavian approach cardiac rhythm maintenance device remains in stable position with leads projecting over the right atrium and right ventricle. The patient is not intubated. Interval placement of a left upper extremity PICC. The catheter tip overlies the cavoatrial junction. Perhaps slightly improved inspiratory volumes with improving aeration in the right mid and lower lung. Persistent patchy airspace opacities in both lower lobes and the left mid lung. No pneumothorax. No significant pleural effusion. No acute osseous abnormality. IMPRESSION: 1. Interval placement of a left upper extremity PICC. The catheter tip overlies the superior cavoatrial junction. 2. Slightly improved inspiratory volumes and improved aeration in the right mid and lower lung. 3. Persistent multifocal patchy airspace opacities most notable in the left mid lung and bilateral bases. Electronically Signed   By: Jacqulynn Cadet M.D.   On:  02/16/2019 10:15   Portable Chest 1 View  Result Date: 02/14/2019 CLINICAL DATA:  Dyspnea EXAM: PORTABLE CHEST 1 VIEW  COMPARISON:  02/23/2019 FINDINGS: Cardiac shadow is stable. Pacing device is again seen. Patchy parenchymal opacities are noted primarily within the right lung but to a lesser degree in the left lung base slightly progressed when compare with the prior study. No bony abnormality is noted. IMPRESSION: Slight progression of parenchymal opacities particularly on the right. Electronically Signed   By: Inez Catalina M.D.   On: 02/14/2019 09:04   Dg Chest Port 1 View  Result Date: 02/09/2019 CLINICAL DATA:  Cough diarrhea nausea and fever EXAM: PORTABLE CHEST 1 VIEW COMPARISON:  04/11/2016 FINDINGS: Right-sided pacing device as before. Patchy bilateral somewhat peripheral airspace opacities. No pleural effusion. Normal heart size. No pneumothorax. IMPRESSION: Low lung volumes. Patchy bilateral ill-defined airspace opacities, concerning for pneumonia. Consider atypical or viral pneumonia in the appropriate clinical setting. Electronically Signed   By: Donavan Foil M.D.   On: 01/30/2019 02:31   [place] Ekg Site Rite  Result Date: 02/14/2019 If Site Rite image not attached, placement could not be confirmed due to current cardiac rhythm.     Phillips Climes M.D on 02/17/2019 at 12:33 PM  Between 7am to 7pm - Pager - [phone]  After 7pm go to [email or web address] - password [facility]  [facility] -  Office  [phone]

## 2019-02-17 NOTE — Progress Notes (Signed)
Pharmacy Antibiotic Note  Clayton Sawyer is a 78 y.o. male admitted on 01/30/2019 with COVID 19.  Pharmacy has been consulted for Vancomycin dosing. MD adding Vanc as CONS in 1/4 [facility] Bcx and now [facility]PC in 1/2 from here - MRSA PCR negative so both could be contaminants. No fever and WBC only slightly elevated.  Confirmed with labs this morning about the second set of blood cultures. It's also 1/4 bottle with a species of CNS (staph homonis). Therefore it's likely a contaminant again. D/w Dr Waldron Labs and we will dc vanc  Plan:  Dc vanc  Height: 5\' 9"  (175.3 cm) Weight: 233 lb 0.4 oz (105.7 kg) IBW/kg (Calculated) : 70.7  Temp (24hrs), Avg:98.1 F (36.7 C), Min:97.8 F (36.6 C), Max:98.6 F (37 C)  Recent Labs  Lab 02/21/2019 0200 02/05/2019 0524 02/14/19 1855 02/15/19 0530 02/16/19 0500 02/17/19 0500  WBC 9.9  --  14.9* 13.2* 13.9* 12.5*  CREATININE 1.10  --   --  1.07 1.20 0.99  LATICACIDVEN 1.9 1.6  --  1.9  --   --     Estimated Creatinine Clearance: 73.7 mL/min (by C-G formula based on SCr of 0.99 mg/dL).    Allergies  Allergen Reactions  . Lisinopril Cough  . Losartan Rash  . Statins Other (See Comments)    Body aches    Antimicrobials this admission: 7/15 Azith/CTX x1 [facility]) 7/15 Remdesivir >> 7/19 7/15 Actemra x1 ([place]) 7/18 Vanc >>7/19  Dose adjustments this admission: N/A  Microbiology results: 7/15 SARS Coronavirus 2: positive 7/15 BCx [facility]): 1/4 bottles CONS +mec A - likely contaminant 7/16 BCx: 1/4 bottles staph homonis 7/15 MRSA PCR: neg  Onnie Boer, PharmD, BCIDP, AAHIVP, CPP Infectious Disease Pharmacist 02/17/2019 1:41 PM

## 2019-02-17 NOTE — Progress Notes (Signed)
Name: Clayton Sawyer MRN: [id number] DOB: 12/03/1940    ADMISSION DATE:  01/30/2019 CONSULTATION DATE:  02/26/2019  REFERRING MD :  Dr. Marcille Blanco  CHIEF COMPLAINT:  Cough  BRIEF PATIENT DESCRIPTION:  Clayton Sawyer is a 78 year old male with a past medical history as listed below who who presents to [facility] ED on 02/12/2019 with complaints of progressive tachypnea, cough, generalized weakness, nausea, diarrhea, and fever.  Of note the patient's wife is currently hospitalized at [facility] secondary to COVID-19 infection.  He reports that he was tested on Friday but has not yet received his results.  Upon presentation to the ED he was noted to be hypoxic with O2 sats 84% on room air and tachypnea with respiratory rate of 24.  Given his work of breathing and hypoxia he was placed on nonrebreather and given 10 mg Decadron IV. Initial work-up in the ED revealed WBC 9.9, lymphocytopenia, glucose 139, LDH 384, triglycerides 156, ferritin 146, fibrinogen 748, fibrin derivatives 1244, lactic acid 1.9, procalcitonin 0.35.  His SARS-CoV-2 PCR is positive in the ED. Chest x-ray reveals patchy bilateral opacities concerning for pneumonia.  Currently [facility] has no bed availability, therefore he is admitted to stepdown unit for further work-up and treatment of acute hypoxic respiratory failure in the setting of COVID-19 infection and questionable secondary pneumonia.  PCCM is consulted for further management   has a past medical history of Abdominal hernia without obstruction or gangrene (05/22/2014), Atherosclerotic heart disease of native coronary artery without angina pectoris (07/22/2011), Calculus of kidney (09/26/2011), Chronotropic incompetence (92/42/6834), Diastolic dysfunction (19/62/2297), Essential (primary) hypertension (07/22/2011), History of stroke (09/26/2011), Hyperlipidemia (07/22/2011), Hypertension, Malignant neoplasm of prostate ([place]) (03/04/2013), MI, old, Migraine without status migrainosus, not  intractable (05/22/2014), Osteoarthritis (09/26/2011), Presence of cardiac pacemaker (07/21/2011), and Varicella without complication (98/92/1194).   SIGNIFICANT EVENTS  7/15>>Admission to Stepdown 7/15>> COVID-19 PCR Positive 7/16 On HHFNC . Says tummy time helps.Had breakfast.. Left Picc. S/p research drug [email or web address]  7/17 - s/p research drug yesterday. Tis AM - hypoxemia better 45L/100% to 35L/65%. Also did prone. Feeling better. Currently pulse ox 89%. Taste is comin back. CRP much better; Marland Kitchen D-dimer better. Walked around in bed   7/18 -he feels he is somewhat declined.  He is currently on 35 L high flow nasal cannula at 80% FiO2 which is slightly worse than yesterday.  He does have some anxiety.  No fever.  Review of labs indicate improvement in CRP, ferritin, chest x-ray and d-dimer. FULL CODE per family   STUDIES:  N/A  CULTURES: SARS-CoV-2 PCR 7/15>> positive Blood x2 7/15>> Coag neg staph on BCID MRSA PCR 7/15 - negative Strep pneumo urinary antigen 7/15>> neg Legionella urinary antigen 7/15>>  ANTIBIOTICS: Tocilizumab 7/15  [facility] ER  Remdesivir 7/15>> Solumedrol 02/14/2019 >> Research Drug -  [email or web address]  (CTGF Mab Pamrevlumab) v Placebo 02/14/2019 (Q1 week x 4 weeks) >> (02/21/2019 planned)   SUBJECTIVE:    02/17/2019  - CRP down to 1.0. FErriting down to 122 but havhing worsening hypoxemia. 45L / 90% on HHFNC.Anxious to go home. Somewhat in denial about several week recovery wit high mortality risk. Deflected questions about goals of care. Talked about grandaughter and great grand kids. Worked as a [employer] manager .  VITAL SIGNS: Temp:  [97.8 F (36.6 C)-98.6 F (37 C)] 98 F (36.7 C) (07/19 0800) Pulse Rate:  [51-112] 109 (07/19 1100) Resp:  [15-24] 15 (07/19 0400) BP: (91-142)/(31-129) 92/76 (07/19 1100) SpO2:  [82 %-97 %] 93 % (07/19 1100)  FiO2 (%):  [80 %-100 %] 90 % (07/19 0725)    General Appearance:  Looks chronic unwell. Sittig In chair. [place]:   Normocephalic, without obvious abnormality, atraumatic Eyes:  PERRL - yes, conjunctiva/corneas - clear     Ears:  Normal external ear canals, both ears Nose:  G tube - no but HAs HHFNC Throat:  ETT TUBE - no , OG tube - no Neck:  Supple,  No enlargement/tenderness/nodules Lungs: Clear to auscultation bilaterally but at base has crackles Heart:  S1 and S2 normal, no murmur, CVP - no.  Pressors - no Abdomen:  Soft, no masses, no organomegaly Genitalia / Rectal:  Not done Extremities:  Extremities- intact Skin:  ntact in exposed areas . Sacral area - normal Neurologic:  Sedation - none -> RASS - +1 . Moves all 4s - yes. CAM-ICU - neg . Orientation - x3+             ASSESSMENT / PLAN:  Acute Hypoxic Respiratory Failure secondary to COVID-19 infection & ? Pneumonia  -Remdesivir - Steroids -  research drug - d1 complete 7/16  (next dose 7/23)   02/17/2019 - > severe hypoxemia ("happy") getting worse though biomarkes better  PLAN\ -Supplemental O2 as needed to maintain O2 sats>88% - Repeat Tocilizumab 02/17/2019 - off-label 2nd dose - grandaguther okay with this. Discussed risisk bnefits, off label use, uncertain data, ongoing tials, negative RCT x 3, positive retrospective studies  -IV Solu-Medrol daily per triad -Remdesivir per triad - Fibrogen research drug -  Pending - D14, D21, D28 (can be done as opd if dishcarged earlier) - next dose 02/21/2019  -Maintain Euvovolemia as able - combivent mdi q6h at request for grandaughter   Coagulase Neg Staph In 1 of 4 blood culture 7/15  - empiric vanc because he has pacer      Best practice:  Diet: regular Pain/Anxiety/Delirium protocol (if indicated): none VAP protocol (if indicated): none DVT prophylaxis: per tRH GI prophylaxis: pert TRH Glucose control: per TRH Mobility: as able Code Status: full Family Communication: by ccm MD - [place]  Disposition: ICU   SIGNATURE    Dr. Brand Males, M.D., F.C.C.P,   Pulmonary and Critical Care Medicine Staff Physician, [place] Director - Interstitial Lung Disease  Program  Pulmonary [place] at [place], [place], [address]  Pager: [phone], If no answer or between  15:00h - 7:00h: call [phone] Telephone: [phone]  11:28 AM 02/17/2019    LABS    PULMONARY No results for input(s): PHART, PCO2ART, PO2ART, HCO3, TCO2, O2SAT in the last 168 hours.  Invalid input(s): PCO2, PO2  CBC Recent Labs  Lab 02/15/19 0530 02/16/19 0500 02/17/19 0500  HGB 14.5 14.7 14.3  HCT 43.2 44.8 43.2  WBC 13.2* 13.9* 12.5*  PLT 280 296 228    COAGULATION No results for input(s): INR in the last 168 hours.  CARDIAC  No results for input(s): TROPONINI in the last 168 hours. No results for input(s): PROBNP in the last 168 hours.   CHEMISTRY Recent Labs  Lab 02/07/2019 0200 02/15/19 0530 02/16/19 0500 02/17/19 0500  NA 135 140 140 141  K 3.9 4.2 4.4 4.8  CL 99 105 102 106  CO2 22 23 25 27   GLUCOSE 139* 155* 151* 155*  BUN 18 30* 36* 34*  CREATININE 1.10 1.07 1.20 0.99  CALCIUM 8.0* 8.6* 8.6* 8.2*  MG  --  1.9  2.2 2.4  PHOS  --  3.3 2.5 3.5   Estimated Creatinine Clearance: 73.7 mL/min (by C-G formula based on SCr of 0.99 mg/dL).   LIVER Recent Labs  Lab 03/01/2019 0200 02/15/19 0530 02/16/19 0500 02/17/19 0500  AST 32 37 36 39  ALT 16 20 22 27   ALKPHOS 69 58 65 60  BILITOT 1.0 0.5 0.7 0.6  PROT 6.8 5.9* 5.7* 5.1*  ALBUMIN 3.4* 2.7* 2.9* 2.6*     INFECTIOUS Recent Labs  Lab 02/01/2019 0200 02/24/2019 0524 02/15/19 0530  LATICACIDVEN 1.9 1.6 1.9  PROCALCITON 0.35  --   --      ENDOCRINE CBG (last 3)  Recent Labs    02/16/19 1706 02/16/19 2102 02/17/19 0804  GLUCAP 152* 204* 139*         IMAGING x48h  - image(s) personally visualized  -   highlighted in bold Dg Chest Port 1 View  Result Date: 02/16/2019 CLINICAL DATA:  78 year old male  currently admitted with COVID-19 viral pneumonia EXAM: PORTABLE CHEST 1 VIEW COMPARISON:  Prior chest x-ray 02/14/2019 FINDINGS: Right upper extremity subclavian approach cardiac rhythm maintenance device remains in stable position with leads projecting over the right atrium and right ventricle. The patient is not intubated. Interval placement of a left upper extremity PICC. The catheter tip overlies the cavoatrial junction. Perhaps slightly improved inspiratory volumes with improving aeration in the right mid and lower lung. Persistent patchy airspace opacities in both lower lobes and the left mid lung. No pneumothorax. No significant pleural effusion. No acute osseous abnormality. IMPRESSION: 1. Interval placement of a left upper extremity PICC. The catheter tip overlies the superior cavoatrial junction. 2. Slightly improved inspiratory volumes and improved aeration in the right mid and lower lung. 3. Persistent multifocal patchy airspace opacities most notable in the left mid lung and bilateral bases. Electronically Signed   By: Jacqulynn Cadet M.D.   On: 02/16/2019 10:15

## 2019-02-18 ENCOUNTER — Inpatient Hospital Stay (HOSPITAL_COMMUNITY): Payer: Medicare Other

## 2019-02-18 DIAGNOSIS — J1289 Other viral pneumonia: Secondary | ICD-10-CM

## 2019-02-18 LAB — D-DIMER, QUANTITATIVE: D-Dimer, Quant: 7.68 ug/mL-FEU — ABNORMAL HIGH (ref 0.00–0.50)

## 2019-02-18 LAB — COMPREHENSIVE METABOLIC PANEL
ALT: 35 U/L (ref 0–44)
AST: 42 U/L — ABNORMAL HIGH (ref 15–41)
Albumin: 2.6 g/dL — ABNORMAL LOW (ref 3.5–5.0)
Alkaline Phosphatase: 67 U/L (ref 38–126)
Anion gap: 9 (ref 5–15)
BUN: 35 mg/dL — ABNORMAL HIGH (ref 8–23)
CO2: 25 mmol/L (ref 22–32)
Calcium: 8.1 mg/dL — ABNORMAL LOW (ref 8.9–10.3)
Chloride: 107 mmol/L (ref 98–111)
Creatinine, Ser: 1.08 mg/dL (ref 0.61–1.24)
GFR calc Af Amer: >60 mL/min (ref >60)
GFR calc non Af Amer: >60 mL/min (ref >60)
Glucose, Bld: 144 mg/dL — ABNORMAL HIGH (ref 70–99)
Potassium: 4.8 mmol/L (ref 3.5–5.1)
Sodium: 141 mmol/L (ref 135–145)
Total Bilirubin: 0.7 mg/dL (ref 0.3–1.2)
Total Protein: 5.1 g/dL — ABNORMAL LOW (ref 6.5–8.1)

## 2019-02-18 LAB — GLUCOSE, CAPILLARY
Glucose-Capillary: 133 mg/dL — ABNORMAL HIGH (ref 70–99)
Glucose-Capillary: 249 mg/dL — ABNORMAL HIGH (ref 70–99)
Glucose-Capillary: 170 mg/dL — ABNORMAL HIGH (ref 70–99)
Glucose-Capillary: 192 mg/dL — ABNORMAL HIGH (ref 70–99)

## 2019-02-18 LAB — CULTURE, BLOOD (ROUTINE X 2): Culture: NO GROWTH

## 2019-02-18 LAB — CBC
HCT: 44.8 % (ref 39.0–52.0)
Hemoglobin: 15 g/dL (ref 13.0–17.0)
MCH: 30.4 pg (ref 26.0–34.0)
MCHC: 33.5 g/dL (ref 30.0–36.0)
MCV: 90.7 fL (ref 80.0–100.0)
Platelets: 226 10*3/uL (ref 150–400)
RBC: 4.94 MIL/uL (ref 4.22–5.81)
RDW: 14.4 % (ref 11.5–15.5)
WBC: 14.5 10*3/uL — ABNORMAL HIGH (ref 4.0–10.5)
nRBC: 0.2 % (ref 0.0–0.2)

## 2019-02-18 LAB — PHOSPHORUS: Phosphorus: 3.9 mg/dL (ref 2.5–4.6)

## 2019-02-18 LAB — C-REACTIVE PROTEIN: CRP: <0.8 mg/dL (ref <1.0)

## 2019-02-18 LAB — MAGNESIUM: Magnesium: 2.2 mg/dL (ref 1.7–2.4)

## 2019-02-18 LAB — FERRITIN: Ferritin: 134 ng/mL (ref 24–336)

## 2019-02-18 MED ORDER — IPRATROPIUM-ALBUTEROL 20-100 MCG/ACT IN AERS
2.0000 | INHALATION_SPRAY | RESPIRATORY_TRACT | Status: DC | PRN
  Filled 2019-02-18: qty 4

## 2019-02-18 MED ORDER — PANTOPRAZOLE SODIUM 40 MG PO TBEC
40.0000 mg | DELAYED_RELEASE_TABLET | Freq: Every day | ORAL | Status: DC
  Administered 2019-02-18 – 2019-02-27 (×10): 40 mg via ORAL
  Filled 2019-02-18 (×10): qty 1

## 2019-02-18 NOTE — Progress Notes (Signed)
Updated pt's wife about current status and treatment plan.  Chesley Mires, MD [facility] Pulmonary/Critical Care 02/18/2019, 4:46 PM

## 2019-02-18 NOTE — Progress Notes (Signed)
Pt has been consistently complaining that he cannot sleep for the duration of my shift. Trazadone & Xanax were given to pt @2114  and 2259. Pt self-proned @2115 , O2 sats 99-100%. At roughly 2339, pt became very anxious while self-proning. He was frustrated with the many cords & O2 tubing & became extremely tachypneic & restless. Pt was helped into a L side-lying position & NRB was also placed on pt. Pt's O2 sats remained in the low 80's for less than 1hr. Pt A&O and laying comfortably in bed, no anxiety or tachypnea at that time. Elink MD was called to see if we could help ease the pt's anxiety and help him sleep by increasing a current PRN dose of medication or adding a new medication. MD adamantly stated that pt is hypoxic & confused d/t current O2 sat 82%. This RN explained this is normal for the pt after an anxiety attack & that it takes roughly 1-2hrs for his sats to stabilize. MD ordered stat CXR & spoke of possibly intubating the pt.   Pt is currently resting in bed, on 60L 100% HFNC and O2 sats are 90%. CXR results back with no new changes. Will continue to monitor pt & will wait to hear from Dr. Oletta Darter.

## 2019-02-18 NOTE — Progress Notes (Signed)
Spoke with pt's granddaughter Apolonio Schneiders via the phone and updated her and answered any questions she had.

## 2019-02-18 NOTE — Progress Notes (Signed)
PROGRESS NOTE                                                                                                                                                                                                             Patient Demographics:    Clayton Sawyer, is a 78 y.o. male, DOB - [birth year], MBW:[id number]  Admit date - 03/01/2019   Admitting Physician Courage Denton Brick, MD  Outpatient Primary MD for the patient is Jodi Marble, MD  LOS - 5   No chief complaint on file.      Brief Narrative    78 y.o. male with history of  hypertension, prostate cancer, history of prior stroke  Presents from [facility] with shortness of breath, hypoxic, COVID-19 positive, wife was recently discharged COVID-19 infection, admitted to ICU for  management of heated high flow nasal cannula.   Subjective:    Vassie Moment had poor night sleep, bridle, denies any nausea or vomiting .   Assessment  & Plan :    Principal Problem:   Acute respiratory disease due to COVID-19 virus Active Problems:   Presence of cardiac pacemaker   History of stroke   Essential (primary) hypertension   Atherosclerotic heart disease of native coronary artery without angina pectoris/PCI stents 09/2004, 03/2009 and 10/2011   Bil Pneumonia due to COVID-19 virus   Acute hypoxic respiratory failure due to COVID-19 for pneumonia  FiO2 (%):  [90 %-100 %] 100 %  IV Solu-Medrol. IV Remdesivir Actemra received at [facility] 7/15, repeated on 7/19 - research drug - d1 complete 7/16 . Pending - D14, D21, D28 (can be done as opd if dishcarged earlier)  -He remains with significant oxygen requirement, had increased requirement from heated high flow nasal cannula, he is on 50 L high flow heated nasal cannula 100% FiO2 . -Was encouraged to prone and use incentive spirometry -Continue to follow inflammatory markers closely -Continue with zinc and vitamin C -D-dimer significantly increasing up, continue  with COVID-19 DVT prophylaxis protocol, Lovenox 40 mg subcu twice daily   COVID-19 Labs  Recent Labs    02/16/19 0500 02/17/19 0500 02/18/19 0500  DDIMER 1.57* 2.21* 7.68*  FERRITIN 169 122 134  CRP 2.5* 1.0* <0.8    Lab Results  Component Value Date   SARSCOV2NAA POSITIVE (A) 02/20/2019   SARSCOV2NAA Detected (A) 02/08/2019  Positive blood culture -patient had 1/4 positive for staph coag negative at [place], and another 1/2 bottles positive for Staphylococcus hominis, both are contaminant, will DC vancomycin .  H/O CVA - stable, patient is apparently intolerant to statins, continue Plavix 75 mg daily  HTN -stable, continue Toprol-XL 25 mg daily  H/o CAD - prior PCI/stent on 09/2004, 03/2009 on 10/2011-double, no ACS type symptoms, continue Toprol-XL 25 mg daily and Plavix 35 mg daily, patient is apparently intolerant to statins  History of chronotropic incompetence/status post pacemaker placement -stable at this time   Code Status : Full Code  Family Communication  : Family been called and updated by pulmonary  Disposition Plan  : pending further work up  Consults  :  PCCM  Procedures  : None  DVT Prophylaxis  :  Lovenox  Lab Results  Component Value Date   PLT 226 02/18/2019    Antibiotics  :    Anti-infectives (From admission, onward)   Start     Dose/Rate Route Frequency Ordered Stop   02/17/19 1000  vancomycin (VANCOCIN) 1,500 mg in sodium chloride 0.9 % 500 mL IVPB  Status:  Discontinued     1,500 mg 250 mL/hr over 120 Minutes Intravenous Every 24 hours 02/16/19 0934 02/17/19 1338   02/16/19 1000  vancomycin (VANCOCIN) 2,000 mg in sodium chloride 0.9 % 500 mL IVPB     2,000 mg 250 mL/hr over 120 Minutes Intravenous  Once 02/16/19 0934 02/16/19 1250   02/14/19 0700  remdesivir 100 mg in sodium chloride 0.9 % 250 mL IVPB     100 mg 500 mL/hr over 30 Minutes Intravenous Every 24 hours 02/14/19 0010 02/17/19 1625        Objective:   Vitals:    02/18/19 0700 02/18/19 0743 02/18/19 0800 02/18/19 0955  BP: (!) 128/54  125/74 125/89  Pulse: (!) 53  100 (!) 105  Resp:      Temp:  (!) 97.5 F (36.4 C)    TempSrc:  Oral    SpO2: 93%  90%   Weight:      Height:        Wt Readings from Last 3 Encounters:  02/04/2019 105.7 kg  03/01/2019 106.1 kg  07/16/18 108.5 kg     Intake/Output Summary (Last 24 hours) at 02/18/2019 1328 Last data filed at 02/18/2019 0959 Gross per 24 hour  Intake 620 ml  Output 900 ml  Net -280 ml     Physical Exam  Awake Alert, Oriented X 3, No new F.N deficits, Normal affect Symmetrical Chest wall movement, Good air movement bilaterally, scattered rales RRR,No Gallops,Rubs or new Murmurs, No Parasternal Heave +ve B.Sounds, Abd Soft, No tenderness, No rebound - guarding or rigidity. No Cyanosis, Clubbing or edema, No new Rash or bruise         Data Review:    CBC Recent Labs  Lab 02/24/2019 0200 02/14/19 1855 02/15/19 0530 02/16/19 0500 02/17/19 0500 02/18/19 0500  WBC 9.9 14.9* 13.2* 13.9* 12.5* 14.5*  HGB 15.0 15.6 14.5 14.7 14.3 15.0  HCT 44.9 47.9 43.2 44.8 43.2 44.8  PLT 208 332 280 296 228 226  MCV 90.2 90.2 90.0 89.6 90.9 90.7  MCH 30.1 29.4 30.2 29.4 30.1 30.4  MCHC 33.4 32.6 33.6 32.8 33.1 33.5  RDW 14.1 14.1 14.1 14.0 14.3 14.4  LYMPHSABS 0.6*  --   --   --   --   --   MONOABS 0.6  --   --   --   --   --  EOSABS 0.0  --   --   --   --   --   BASOSABS 0.0  --   --   --   --   --     Chemistries  Recent Labs  Lab 02/02/2019 0200 02/15/19 0530 02/16/19 0500 02/17/19 0500 02/18/19 0500  NA 135 140 140 141 141  K 3.9 4.2 4.4 4.8 4.8  CL 99 105 102 106 107  CO2 22 23 25 27 25   GLUCOSE 139* 155* 151* 155* 144*  BUN 18 30* 36* 34* 35*  CREATININE 1.10 1.07 1.20 0.99 1.08  CALCIUM 8.0* 8.6* 8.6* 8.2* 8.1*  MG  --  1.9 2.2 2.4 2.2  AST 32 37 36 39 42*  ALT 16 20 22 27  35  ALKPHOS 69 58 65 60 67  BILITOT 1.0 0.5 0.7 0.6 0.7    ------------------------------------------------------------------------------------------------------------------ No results for input(s): CHOL, HDL, LDLCALC, TRIG, CHOLHDL, LDLDIRECT in the last 72 hours.  Lab Results  Component Value Date   HGBA1C 5.9 09/22/2012   ------------------------------------------------------------------------------------------------------------------ No results for input(s): TSH, T4TOTAL, T3FREE, THYROIDAB in the last 72 hours.  Invalid input(s): FREET3 ------------------------------------------------------------------------------------------------------------------ Recent Labs    02/17/19 0500 02/18/19 0500  FERRITIN 122 134    Coagulation profile No results for input(s): INR, PROTIME in the last 168 hours.  Recent Labs    02/17/19 0500 02/18/19 0500  DDIMER 2.21* 7.68*    Cardiac Enzymes No results for input(s): CKMB, TROPONINI, MYOGLOBIN in the last 168 hours.  Invalid input(s): CK ------------------------------------------------------------------------------------------------------------------ No results found for: BNP  Inpatient Medications  Scheduled Meds:  Chlorhexidine Gluconate Cloth  6 each Topical Daily   clopidogrel  75 mg Oral Daily   enoxaparin (LOVENOX) injection  40 mg Subcutaneous Q12H   feeding supplement (ENSURE ENLIVE)  237 mL Oral TID BM   insulin aspart  0-20 Units Subcutaneous TID WC   insulin aspart  0-5 Units Subcutaneous QHS   Ipratropium-Albuterol  2 puff Inhalation Q6H   methylPREDNISolone sodium succinate  60 mg Intravenous Q12H   metoprolol succinate  25 mg Oral Daily   pantoprazole  40 mg Oral Daily   polyethylene glycol  17 g Oral BID   sodium chloride flush  10-40 mL Intracatheter Q12H   sodium chloride flush  3 mL Intravenous Q12H   pamrevlumab or placebo  35 mg/kg Intravenous Q7 days   Followed by   Derrill Memo ON 03/14/2019] pamrevlumab or placebo  35 mg/kg Intravenous Once    ascorbic acid  500 mg Oral Daily   zinc sulfate  220 mg Oral Daily   Continuous Infusions:  sodium chloride     PRN Meds:.sodium chloride, acetaminophen **OR** acetaminophen, albuterol, ALPRAZolam, hydrALAZINE, lip balm, nitroGLYCERIN, ondansetron **OR** ondansetron (ZOFRAN) IV, polyethylene glycol, sodium chloride flush, sodium chloride flush, traZODone  Micro Results Recent Results (from the past 240 hour(s))  SARS Coronavirus 2 [facility] order, Performed in [place] hospital lab)     Status: Abnormal   Collection Time: 02/10/2019  2:00 AM   Specimen: Nasopharyngeal Swab  Result Value Ref Range Status   SARS Coronavirus 2 POSITIVE (A) NEGATIVE Final    Comment: CRITICAL RESULT CALLED TO, READ BACK BY AND VERIFIED WITH: AMY SMITH @333  02/05/2019 TTG  (NOTE) If result is NEGATIVE SARS-CoV-2 target nucleic acids are NOT DETECTED. The SARS-CoV-2 RNA is generally detectable in upper and lower  respiratory specimens during the acute phase of infection. The lowest  concentration of SARS-CoV-2 viral copies this assay can detect  is 250  copies / mL. A negative result does not preclude SARS-CoV-2 infection  and should not be used as the sole basis for treatment or other  patient management decisions.  A negative result may occur with  improper specimen collection / handling, submission of specimen other  than nasopharyngeal swab, presence of viral mutation(s) within the  areas targeted by this assay, and inadequate number of viral copies  (<250 copies / mL). A negative result must be combined with clinical  observations, patient history, and epidemiological information. If result is POSITIVE SARS-CoV-2 target nucleic acids are DETECTED.  The SARS-CoV-2 RNA is generally detectable in upper and lower  respiratory specimens during the acute phase of infection.  Positive  results are indicative of active infection with SARS-CoV-2.  Clinical  correlation with patient history and other  diagnostic information is  necessary to determine patient infection status.  Positive results do  not rule out bacterial infection or co-infection with other viruses. If result is PRESUMPTIVE POSTIVE SARS-CoV-2 nucleic acids MAY BE PRESENT.   A presumptive positive result was obtained on the submitted specimen  and confirmed on repeat testing.  While 2019 novel coronavirus  (SARS-CoV-2) nucleic acids may be present in the submitted sample  additional confirmatory testing may be necessary for epidemiological  and / or clinical management purposes  to differentiate between  SARS-CoV-2 and other Sarbecovirus currently known to infect humans.  If clinically indicated additional testing with an alternate test  methodology [phone])  is advised. The SARS-CoV-2 RNA is generally  detectable in upper and lower respiratory specimens during the acute  phase of infection. The expected result is Negative. Fact Sheet for Patients:  StrictlyIdeas.no Fact Sheet for Healthcare Providers: BankingDealers.co.za This test is not yet approved or cleared by the [place] FDA and has been authorized for detection and/or diagnosis of SARS-CoV-2 by FDA under an Emergency Use Authorization (EUA).  This EUA will remain in effect (meaning this test can be used) for the duration of the COVID-19 declaration under Section 564(b)(1) of the Act, 21 U.S.C. section 360bbb-3(b)(1), unless the authorization is terminated or revoked sooner. Performed at [facility], [address] [place], [place] [address]   Blood Culture (routine x 2)     Status: None   Collection Time: 01/31/2019  2:01 AM   Specimen: BLOOD  Result Value Ref Range Status   Specimen Description   Final    BLOOD LEFT ANTECUBITAL Performed at [facility], [place] [address], [place], [place] [address]    Special Requests   Final    BOTTLES DRAWN AEROBIC AND ANAEROBIC Blood Culture results  may not be optimal due to an excessive volume of blood received in culture bottles   Culture   Final    NO GROWTH 5 DAYS Performed at [facility], [address] [place], [place] [address]    Report Status 02/18/2019 FINAL  Final  Blood Culture (routine x 2)     Status: Abnormal   Collection Time: 02/25/2019  3:44 AM   Specimen: BLOOD  Result Value Ref Range Status   Specimen Description   Final    BLOOD RIGHT ANTECUBITAL Performed at [facility], [place] [address], [place], [place] [address]    Special Requests   Final    BOTTLES DRAWN AEROBIC AND ANAEROBIC Blood Culture adequate volume Performed at [facility], [address], [place], [place] [address]    Culture  Setup Time   Final  GRAM POSITIVE COCCI AEROBIC BOTTLE ONLY CRITICAL RESULT CALLED TO, READ BACK BY AND VERIFIED WITH: [name] CHAPPELL ON 02/14/2019 AT 11:26 [place].    Culture (A)  Final    STAPHYLOCOCCUS SPECIES (COAGULASE NEGATIVE) THE SIGNIFICANCE OF ISOLATING THIS ORGANISM FROM A SINGLE SET OF BLOOD CULTURES WHEN MULTIPLE SETS ARE DRAWN IS UNCERTAIN. PLEASE NOTIFY THE MICROBIOLOGY DEPARTMENT WITHIN ONE WEEK IF SPECIATION AND SENSITIVITIES ARE REQUIRED. Performed at [facility], [place] [address], [place], [place] [address]    Report Status 02/16/2019 FINAL  Final  Blood Culture ID Panel (Reflexed)     Status: Abnormal   Collection Time: 02/16/2019  3:44 AM  Result Value Ref Range Status   Enterococcus species NOT DETECTED NOT DETECTED Final   Listeria monocytogenes NOT DETECTED NOT DETECTED Final   Staphylococcus species DETECTED (A) NOT DETECTED Final    Comment: Methicillin (oxacillin) resistant coagulase negative staphylococcus. Possible blood culture contaminant (unless isolated from more than one blood culture draw or clinical case suggests pathogenicity). No antibiotic treatment is indicated for blood  culture contaminants. CRITICAL RESULT CALLED TO, READ BACK BY AND VERIFIED  WITH:  [name] CHAPPELL ON 02/14/2019 AT 11:26. TIK    Staphylococcus aureus (BCID) NOT DETECTED NOT DETECTED Final   Methicillin resistance DETECTED (A) NOT DETECTED Final    Comment: CRITICAL RESULT CALLED TO, READ BACK BY AND VERIFIED WITH: [name] CHAPPELL ON 02/14/2019 AT 11:26. TIK    Streptococcus species NOT DETECTED NOT DETECTED Final   Streptococcus agalactiae NOT DETECTED NOT DETECTED Final   Streptococcus pneumoniae NOT DETECTED NOT DETECTED Final   Streptococcus pyogenes NOT DETECTED NOT DETECTED Final   Acinetobacter baumannii NOT DETECTED NOT DETECTED Final   Enterobacteriaceae species NOT DETECTED NOT DETECTED Final   Enterobacter cloacae complex NOT DETECTED NOT DETECTED Final   Escherichia coli NOT DETECTED NOT DETECTED Final   Klebsiella oxytoca NOT DETECTED NOT DETECTED Final   Klebsiella pneumoniae NOT DETECTED NOT DETECTED Final   Proteus species NOT DETECTED NOT DETECTED Final   Serratia marcescens NOT DETECTED NOT DETECTED Final   Haemophilus influenzae NOT DETECTED NOT DETECTED Final   Neisseria meningitidis NOT DETECTED NOT DETECTED Final   Pseudomonas aeruginosa NOT DETECTED NOT DETECTED Final   Candida albicans NOT DETECTED NOT DETECTED Final   Candida glabrata NOT DETECTED NOT DETECTED Final   Candida krusei NOT DETECTED NOT DETECTED Final   Candida parapsilosis NOT DETECTED NOT DETECTED Final   Candida tropicalis NOT DETECTED NOT DETECTED Final    Comment: Performed at [facility], [address] [place], [place] [address]  MRSA PCR Screening     Status: None   Collection Time: 02/28/2019  4:51 AM   Specimen: Nasopharyngeal  Result Value Ref Range Status   MRSA by PCR NEGATIVE NEGATIVE Final    Comment:        The GeneXpert MRSA Assay (FDA approved for NASAL specimens only), is one component of a comprehensive MRSA colonization surveillance program. It is not intended to diagnose MRSA infection nor to guide or monitor treatment for MRSA  infections. Performed at [facility], [address] [place], [place] [address]   Culture, blood (routine x 2)     Status: Abnormal   Collection Time: 02/14/19  6:50 PM   Specimen: BLOOD RIGHT HAND  Result Value Ref Range Status   Specimen Description   Final    BLOOD RIGHT HAND Performed at [facility] [address], [place], [place] [address]  Special Requests   Final    BOTTLES DRAWN AEROBIC ONLY Blood Culture adequate volume Performed at [facility] [address], [place], [place] [address]    Culture  Setup Time   Final    AEROBIC BOTTLE ONLY GRAM POSITIVE COCCI CRITICAL VALUE NOTED.  VALUE IS CONSISTENT WITH PREVIOUSLY REPORTED AND CALLED VALUE.    Culture (A)  Final    STAPHYLOCOCCUS SPECIES (COAGULASE NEGATIVE) THE SIGNIFICANCE OF ISOLATING THIS ORGANISM FROM A SINGLE SET OF BLOOD CULTURES WHEN MULTIPLE SETS ARE DRAWN IS UNCERTAIN. PLEASE NOTIFY THE MICROBIOLOGY DEPARTMENT WITHIN ONE WEEK IF SPECIATION AND SENSITIVITIES ARE REQUIRED. Performed at [facility], [place] [address], [place], [place] [address]    Report Status 02/17/2019 FINAL  Final  Culture, blood (routine x 2)     Status: None (Preliminary result)   Collection Time: 02/14/19  6:55 PM   Specimen: BLOOD RIGHT FOREARM  Result Value Ref Range Status   Specimen Description   Final    BLOOD RIGHT FOREARM Performed at [facility], [place] [address] [place], [place] [address]    Special Requests   Final    BOTTLES DRAWN AEROBIC ONLY Blood Culture results may not be optimal due to an excessive volume of blood received in culture bottles Performed at [facility] [address], [place], [place] [address]    Culture   Final    NO GROWTH 4 DAYS Performed at [facility], [place] [address], [place], [place] [address]    Report Status PENDING  Incomplete    Radiology Reports Dg Chest Port 1 View  Result Date:  02/18/2019 CLINICAL DATA:  77 year old male with hypoxia. EXAM: PORTABLE CHEST 1 VIEW COMPARISON:  Chest radiograph dated 02/16/2019 FINDINGS: Shallow inspiration. Bilateral lower lung field airspace densities, left greater right similar to prior radiograph. There is no pneumothorax. No large effusion. Stable cardiac silhouette. Right-sided pacemaker device and left-sided PICC in similar position. No acute osseous pathology. IMPRESSION: No significant interval change in the bilateral lower lung field airspace densities. Electronically Signed   By: Anner Crete M.D.   On: 02/18/2019 01:04   Dg Chest Port 1 View  Result Date: 02/16/2019 CLINICAL DATA:  78 year old male currently admitted with COVID-19 viral pneumonia EXAM: PORTABLE CHEST 1 VIEW COMPARISON:  Prior chest x-ray 02/14/2019 FINDINGS: Right upper extremity subclavian approach cardiac rhythm maintenance device remains in stable position with leads projecting over the right atrium and right ventricle. The patient is not intubated. Interval placement of a left upper extremity PICC. The catheter tip overlies the cavoatrial junction. Perhaps slightly improved inspiratory volumes with improving aeration in the right mid and lower lung. Persistent patchy airspace opacities in both lower lobes and the left mid lung. No pneumothorax. No significant pleural effusion. No acute osseous abnormality. IMPRESSION: 1. Interval placement of a left upper extremity PICC. The catheter tip overlies the superior cavoatrial junction. 2. Slightly improved inspiratory volumes and improved aeration in the right mid and lower lung. 3. Persistent multifocal patchy airspace opacities most notable in the left mid lung and bilateral bases. Electronically Signed   By: Jacqulynn Cadet M.D.   On: 02/16/2019 10:15   Portable Chest 1 View  Result Date: 02/14/2019 CLINICAL DATA:  Dyspnea EXAM: PORTABLE CHEST 1 VIEW COMPARISON:  02/19/2019 FINDINGS: Cardiac shadow is stable. Pacing  device is again seen. Patchy parenchymal opacities are noted primarily within the right lung but to a lesser degree in the left  lung base slightly progressed when compare with the prior study. No bony abnormality is noted. IMPRESSION: Slight progression of parenchymal opacities particularly on the right. Electronically Signed   By: Inez Catalina M.D.   On: 02/14/2019 09:04   Dg Chest Port 1 View  Result Date: 02/18/2019 CLINICAL DATA:  Cough diarrhea nausea and fever EXAM: PORTABLE CHEST 1 VIEW COMPARISON:  04/11/2016 FINDINGS: Right-sided pacing device as before. Patchy bilateral somewhat peripheral airspace opacities. No pleural effusion. Normal heart size. No pneumothorax. IMPRESSION: Low lung volumes. Patchy bilateral ill-defined airspace opacities, concerning for pneumonia. Consider atypical or viral pneumonia in the appropriate clinical setting. Electronically Signed   By: Donavan Foil M.D.   On: 01/30/2019 02:31   [place] Ekg Site Rite  Result Date: 02/14/2019 If Site Rite image not attached, placement could not be confirmed due to current cardiac rhythm.     Phillips Climes M.D on 02/18/2019 at 1:28 PM  Between 7am to 7pm - Pager - [phone]  After 7pm go to [email or web address] - password [facility]  [facility] -  Office  [phone]

## 2019-02-18 NOTE — Progress Notes (Signed)
Spoke with Pt's wife Enid Derry over the phone and updated her on her husband's day and plan of care. She was very thankful for all of the care he is getting.

## 2019-02-18 NOTE — Progress Notes (Signed)
NAME:  Clayton Sawyer, MRN:  [id number], DOB:  1941/07/26, LOS: 5 ADMISSION DATE:  02/17/2019, CONSULTATION DATE:  02/20/2019 REFERRING MD:  Dr. Marcille Blanco, Hospitalist, CHIEF COMPLAINT:  Cough   Brief History   78 yo male presented to [facility] with shortness of breath, cough, weakness, nausea, diarrhea, and fever.  Spouse recently hospitalized at [facility] with SARS Coronavirus 2.  Found to have pulmonary infiltrates and hypoxia from SARS Coronavirus 2 and transferred to [facility].  Past Medical History  CAD, Nephrolithiasis, DiastolicCHF, HTN, CVA, HLD, Prostate cancer, Migraine HA, OA, s/p PM, Shingles  Significant Hospital Events   7/15 Admit, transfer to [facility], solumedrol, remdesivir, tocilizumab 7/16 enrolled in fibrinogen research drug  7/19 tocilizumab  Consults:    Procedures:    Significant Diagnostic Tests:    Micro Data:  SARS Coronavirus 2 7/15 >> Positive Blood 7/16 >> Coag neg Staph  Antimicrobials:  Vancomycin 7/18 >> 7/19  Interim history/subjective:  Remains on high flow oxygen.  Feels he is keeping up with ventilatory status.  Denies chest pain.  Objective   Blood pressure 125/89, pulse (!) 105, temperature (!) 97.5 F (36.4 C), temperature source Oral, resp. rate (!) 26, height 5\' 9"  (1.753 m), weight 105.7 kg, SpO2 90 %.    FiO2 (%):  [90 %-100 %] 100 %   Intake/Output Summary (Last 24 hours) at 02/18/2019 1403 Last data filed at 02/18/2019 4854 Gross per 24 hour  Intake 620 ml  Output 900 ml  Net -280 ml   Filed Weights   02/22/2019 2152  Weight: 105.7 kg    Examination:  General - alert Eyes - pupils reactive ENT - no sinus tenderness, no stridor Cardiac - regular rate/rhythm, no murmur Chest - b/l rhonchi, using some accessory muscles, speaking in full sentences Abdomen - soft, non tender, + bowel sounds Extremities - no cyanosis, clubbing, or edema Skin - no rashes Neuro - normal strength, moves extremities, follows commands  CXR (reviewed by me) -  bilateral ASD   Resolved Hospital Problem list     Assessment & Plan:   Acute hypoxic respiratory failure from COVID PNA. - continue solumedrol - next dose of fibrinogen research drug due 7/23 - prn BDs - f/u CXR intermittently - prone positing as able - bronchial hygiene - continue lovenox  Coag Neg Staph in blood culture. - contaminated - agree with plan to d/c ABx  Hx of CAD, HTN, CVA. - continue plavix, toprol  Steroid induced hyperglycemia. - SSI  Best practice:  Diet: heart healthy, carb modified DVT prophylaxis: Lovenox GI prophylaxis: Protonix Mobility: OOB to chair Code Status: full  Disposition: ICU  Labs    CMP Latest Ref Rng & Units 02/18/2019 02/17/2019 02/16/2019  Glucose 70 - 99 mg/dL 144(H) 155(H) 151(H)  BUN 8 - 23 mg/dL 35(H) 34(H) 36(H)  Creatinine 0.61 - 1.24 mg/dL 1.08 0.99 1.20  Sodium 135 - 145 mmol/L 141 141 140  Potassium 3.5 - 5.1 mmol/L 4.8 4.8 4.4  Chloride 98 - 111 mmol/L 107 106 102  CO2 22 - 32 mmol/L 25 27 25   Calcium 8.9 - 10.3 mg/dL 8.1(L) 8.2(L) 8.6(L)  Total Protein 6.5 - 8.1 g/dL 5.1(L) 5.1(L) 5.7(L)  Total Bilirubin 0.3 - 1.2 mg/dL 0.7 0.6 0.7  Alkaline Phos 38 - 126 U/L 67 60 65  AST 15 - 41 U/L 42(H) 39 36  ALT 0 - 44 U/L 35 27 22   CBC Latest Ref Rng & Units 02/18/2019 02/17/2019 02/16/2019  WBC 4.0 -  10.5 K/uL 14.5(H) 12.5(H) 13.9(H)  Hemoglobin 13.0 - 17.0 g/dL 15.0 14.3 14.7  Hematocrit 39.0 - 52.0 % 44.8 43.2 44.8  Platelets 150 - 400 K/uL 226 228 296   ABG No results found for: PHART, PCO2ART, PO2ART, HCO3, TCO2, ACIDBASEDEF, O2SAT  CBG (last 3)  Recent Labs    02/17/19 2111 02/18/19 0739 02/18/19 1156  GLUCAP 141* 133* 249*   CC time 31 minutes  D/w Dr. Waldron Labs  Chesley Mires, MD [place] 02/18/2019, 2:20 PM

## 2019-02-18 NOTE — Progress Notes (Signed)
[name] DOB: 1941/01/09 MRN: [id number]   Date of Service  02/18/2019  HPI/Events of Note  Hypoxia - Sat - 82%  eICU Interventions  Will order: 1. Portable CXR now.      Intervention Category Major Interventions: Hypoxemia - evaluation and management  Nathania Waldman [place] 02/18/2019, 12:41 AM

## 2019-02-19 DIAGNOSIS — Z8673 Personal history of transient ischemic attack (TIA), and cerebral infarction without residual deficits: Secondary | ICD-10-CM

## 2019-02-19 LAB — CULTURE, BLOOD (ROUTINE X 2): Culture: NO GROWTH

## 2019-02-19 LAB — COMPREHENSIVE METABOLIC PANEL
ALT: 43 U/L (ref 0–44)
AST: 34 U/L (ref 15–41)
Albumin: 2.9 g/dL — ABNORMAL LOW (ref 3.5–5.0)
Alkaline Phosphatase: 67 U/L (ref 38–126)
Anion gap: 9 (ref 5–15)
BUN: 35 mg/dL — ABNORMAL HIGH (ref 8–23)
CO2: 25 mmol/L (ref 22–32)
Calcium: 8.3 mg/dL — ABNORMAL LOW (ref 8.9–10.3)
Chloride: 106 mmol/L (ref 98–111)
Creatinine, Ser: 1.06 mg/dL (ref 0.61–1.24)
GFR calc Af Amer: >60 mL/min (ref >60)
GFR calc non Af Amer: >60 mL/min (ref >60)
Glucose, Bld: 167 mg/dL — ABNORMAL HIGH (ref 70–99)
Potassium: 4.9 mmol/L (ref 3.5–5.1)
Sodium: 140 mmol/L (ref 135–145)
Total Bilirubin: 0.8 mg/dL (ref 0.3–1.2)
Total Protein: 5.4 g/dL — ABNORMAL LOW (ref 6.5–8.1)

## 2019-02-19 LAB — CBC
HCT: 47.8 % (ref 39.0–52.0)
Hemoglobin: 15.5 g/dL (ref 13.0–17.0)
MCH: 29.5 pg (ref 26.0–34.0)
MCHC: 32.4 g/dL (ref 30.0–36.0)
MCV: 91 fL (ref 80.0–100.0)
Platelets: 246 10*3/uL (ref 150–400)
RBC: 5.25 MIL/uL (ref 4.22–5.81)
RDW: 14.6 % (ref 11.5–15.5)
WBC: 16.7 10*3/uL — ABNORMAL HIGH (ref 4.0–10.5)
nRBC: 0.1 % (ref 0.0–0.2)

## 2019-02-19 LAB — GLUCOSE, CAPILLARY
Glucose-Capillary: 155 mg/dL — ABNORMAL HIGH (ref 70–99)
Glucose-Capillary: 192 mg/dL — ABNORMAL HIGH (ref 70–99)
Glucose-Capillary: 240 mg/dL — ABNORMAL HIGH (ref 70–99)
Glucose-Capillary: 144 mg/dL — ABNORMAL HIGH (ref 70–99)

## 2019-02-19 LAB — TYPE AND SCREEN
ABO/RH(D): A POS
Antibody Screen: NEGATIVE

## 2019-02-19 LAB — BRAIN NATRIURETIC PEPTIDE: B Natriuretic Peptide: 66.1 pg/mL (ref 0.0–100.0)

## 2019-02-19 LAB — ABO/RH: ABO/RH(D): A POS

## 2019-02-19 LAB — C-REACTIVE PROTEIN: CRP: <0.8 mg/dL (ref <1.0)

## 2019-02-19 LAB — FERRITIN: Ferritin: 124 ng/mL (ref 24–336)

## 2019-02-19 LAB — D-DIMER, QUANTITATIVE: D-Dimer, Quant: 9.51 ug/mL-FEU — ABNORMAL HIGH (ref 0.00–0.50)

## 2019-02-19 MED ORDER — ORAL CARE MOUTH RINSE
15.0000 mL | Freq: Two times a day (BID) | OROMUCOSAL | Status: DC
  Administered 2019-02-20 – 2019-02-26 (×12): 15 mL via OROMUCOSAL

## 2019-02-19 MED ORDER — GUAIFENESIN-CODEINE 100-10 MG/5ML PO SOLN
10.0000 mL | Freq: Every evening | ORAL | Status: DC | PRN
  Administered 2019-02-19 – 2019-02-25 (×6): 10 mL via ORAL
  Filled 2019-02-19 (×6): qty 10

## 2019-02-19 NOTE — Progress Notes (Signed)
NAME:  Clayton Sawyer, MRN:  [id number], DOB:  1940-11-13, LOS: 6 ADMISSION DATE:  02/07/2019, CONSULTATION DATE:  02/04/2019 REFERRING MD:  Dr. Marcille Blanco, Hospitalist, CHIEF COMPLAINT:  Cough   Brief History   78 yo male presented to [facility] with shortness of breath, cough, weakness, nausea, diarrhea, and fever.  Spouse recently hospitalized at [facility] with SARS Coronavirus 2.  Found to have pulmonary infiltrates and hypoxia from SARS Coronavirus 2 and transferred to [facility].  Past Medical History  CAD, Nephrolithiasis, DiastolicCHF, HTN, CVA, HLD, Prostate cancer, Migraine HA, OA, s/p PM, Shingles  Significant Hospital Events   7/15 Admit, transfer to [facility], solumedrol, remdesivir, tocilizumab 7/16 enrolled in fibrinogen research drug  7/19 tocilizumab  Consults:    Procedures:    Significant Diagnostic Tests:    Micro Data:  SARS Coronavirus 2 7/15 >> Positive Blood 7/16 >> Coag neg Staph  Antimicrobials:  Vancomycin 7/18 >> 7/19  Interim history/subjective:  Remains on high flow oxygen.  Feels like he has more energy today.  Can get deep breaths when he is sitting up in chair.  Objective   Blood pressure 131/65, pulse 64, temperature 97.9 F (36.6 C), temperature source Axillary, resp. rate (!) 24, height 5\' 9"  (1.753 m), weight 105.7 kg, SpO2 (!) 89 %.    FiO2 (%):  [90 %-100 %] 90 %   Intake/Output Summary (Last 24 hours) at 02/19/2019 1040 Last data filed at 02/19/2019 0900 Gross per 24 hour  Intake 960 ml  Output 950 ml  Net 10 ml   Filed Weights   02/21/2019 2152  Weight: 105.7 kg    Examination:  General - alert, WOB better Eyes - pupils reactive ENT - no sinus tenderness, no stridor Cardiac - regular rate/rhythm, no murmur Chest - scattered rhonchi Abdomen - soft, non tender, + bowel sounds Extremities - no cyanosis, clubbing, or edema Skin - no rashes Neuro - normal strength, moves extremities, follows commands   Resolved Hospital Problem list   Coag neg  Staph in Blood culture from contaminated specimen  Assessment & Plan:   Acute hypoxic respiratory failure from COVID PNA. - allow for permissive hypoxia - intubate only if he needs ventilatory assistance - day 7 of solumedrol - next dose of fibrinogen research drug due 7/23 - prn BDs - f/u CXR intermittently - incentive spirometry - mobilize as able - prone positioning as able when in bed - continue lovenox  Hx of CAD, HTN, CVA. - continue plavix, toprol  Steroid induced hyperglycemia. - SSI  Best practice:  Diet: heart healthy, carb modified DVT prophylaxis: Lovenox GI prophylaxis: Protonix Mobility: OOB to chair Code Status: full  Disposition: ICU  Labs    CMP Latest Ref Rng & Units 02/19/2019 02/18/2019 02/17/2019  Glucose 70 - 99 mg/dL 167(H) 144(H) 155(H)  BUN 8 - 23 mg/dL 35(H) 35(H) 34(H)  Creatinine 0.61 - 1.24 mg/dL 1.06 1.08 0.99  Sodium 135 - 145 mmol/L 140 141 141  Potassium 3.5 - 5.1 mmol/L 4.9 4.8 4.8  Chloride 98 - 111 mmol/L 106 107 106  CO2 22 - 32 mmol/L 25 25 27   Calcium 8.9 - 10.3 mg/dL 8.3(L) 8.1(L) 8.2(L)  Total Protein 6.5 - 8.1 g/dL 5.4(L) 5.1(L) 5.1(L)  Total Bilirubin 0.3 - 1.2 mg/dL 0.8 0.7 0.6  Alkaline Phos 38 - 126 U/L 67 67 60  AST 15 - 41 U/L 34 42(H) 39  ALT 0 - 44 U/L 43 35 27   CBC Latest Ref Rng & Units  02/19/2019 02/18/2019 02/17/2019  WBC 4.0 - 10.5 K/uL 16.7(H) 14.5(H) 12.5(H)  Hemoglobin 13.0 - 17.0 g/dL 15.5 15.0 14.3  Hematocrit 39.0 - 52.0 % 47.8 44.8 43.2  Platelets 150 - 400 K/uL 246 226 228   ABG No results found for: PHART, PCO2ART, PO2ART, HCO3, TCO2, ACIDBASEDEF, O2SAT  CBG (last 3)  Recent Labs    02/18/19 1620 02/18/19 1959 02/19/19 0745  GLUCAP 170* 192* 155*   D/w Dr. Tollie Eth, MD [place] 02/19/2019, 10:40 AM

## 2019-02-19 NOTE — Progress Notes (Signed)
Spoke with patients wife Clayton Sawyer.  Updated on pt status.  Been up in chair all day, ate all breakfast and currently napping.  Able to wean O2 slightly.  All questions answered.

## 2019-02-19 NOTE — Progress Notes (Signed)
PROGRESS NOTE                                                                                                                                                                                                             Patient Demographics:    Clayton Sawyer, is a 78 y.o. male, DOB - 1941-04-15, LFY:[id number]  Admit date - 03/01/2019   Admitting Physician Courage Denton Brick, MD  Outpatient Primary MD for the patient is Jodi Marble, MD  LOS - 6   No chief complaint on file.      Brief Narrative    78 y.o. male with history of  hypertension, prostate cancer, history of prior stroke  Presents from [facility] with shortness of breath, hypoxic, COVID-19 positive, wife was recently discharged COVID-19 infection, admitted to ICU for  management of heated high flow nasal cannula.   Subjective:    Clayton Sawyer report he had a good night sleep, he did sleep on the chair as he feels better sitting up, no nausea or vomiting  Assessment  & Plan :    Principal Problem:   Acute respiratory disease due to COVID-19 virus Active Problems:   Presence of cardiac pacemaker   History of stroke   Essential (primary) hypertension   Atherosclerotic heart disease of native coronary artery without angina pectoris/PCI stents 09/2004, 03/2009 and 10/2011   Bil Pneumonia due to COVID-19 virus   Acute hypoxic respiratory failure due to COVID-19 for pneumonia  FiO2 (%):  [90 %-100 %] 90 %  IV Solu-Medrol. IV Remdesivir Actemra received at [facility] 7/15, repeated on 7/19 - research drug - d1 complete 7/16 . Pending - D14, D21, D28 (can be done as opd if dishcarged earlier)  -Patient remains with significant oxygen requirement, he is on 15 L high flow nasal cannula, FiO2 of 100% on heated high flow .  She did report he is pronating as much as he is able to, as well using incentive spirometry, but report he is more comfortable sitting up, where he remains sitting up most of the  day yesterday, he even slept on the recliner. -Continue to follow inflammatory markers closely -Continue with zinc and vitamin C -D-dimer significantly increasing up, continue with COVID-19 DVT prophylaxis protocol, Lovenox 40 mg subcu twice daily   COVID-19 Labs  Recent Labs    02/17/19 0500  02/18/19 0500 02/19/19 0500  DDIMER 2.21* 7.68* 9.51*  FERRITIN 122 134 124  CRP 1.0* <0.8 <0.8    Lab Results  Component Value Date   SARSCOV2NAA POSITIVE (A) 02/04/2019   SARSCOV2NAA Detected (A) 02/08/2019    Positive blood culture -patient had 1/4 positive for staph coag negative at [facility], and another 1/2 bottles positive for Staphylococcus hominis, both are contaminant, stopped vancomycin  H/O CVA - stable, patient is apparently intolerant to statins, continue Plavix 75 mg daily  HTN -stable, continue Toprol-XL 25 mg daily  H/o CAD - prior PCI/stent on 09/2004, 03/2009 on 10/2011-double, no ACS type symptoms, continue Toprol-XL 25 mg daily and Plavix 35 mg daily, patient is apparently intolerant to statins  History of chronotropic incompetence/status post pacemaker placement -stable at this time   Code Status : Full Code  Family Communication  : Family been called and updated by pulmonary  Disposition Plan  : Remains in ICU  Consults  :  PCCM  Procedures  : None  DVT Prophylaxis  :  Lovenox  Lab Results  Component Value Date   PLT 246 02/19/2019    Antibiotics  :    Anti-infectives (From admission, onward)   Start     Dose/Rate Route Frequency Ordered Stop   02/17/19 1000  vancomycin (VANCOCIN) 1,500 mg in sodium chloride 0.9 % 500 mL IVPB  Status:  Discontinued     1,500 mg 250 mL/hr over 120 Minutes Intravenous Every 24 hours 02/16/19 0934 02/17/19 1338   02/16/19 1000  vancomycin (VANCOCIN) 2,000 mg in sodium chloride 0.9 % 500 mL IVPB     2,000 mg 250 mL/hr over 120 Minutes Intravenous  Once 02/16/19 0934 02/16/19 1250   02/14/19 0700  remdesivir 100  mg in sodium chloride 0.9 % 250 mL IVPB     100 mg 500 mL/hr over 30 Minutes Intravenous Every 24 hours 02/14/19 0010 02/17/19 1625        Objective:   Vitals:   02/19/19 1131 02/19/19 1200 02/19/19 1300 02/19/19 1400  BP: (!) 143/82 126/80 (!) 149/78   Pulse: (!) 113 (!) 110 87 79  Resp: (!) 27     Temp:  97.7 F (36.5 C)    TempSrc:  Axillary    SpO2: 91% (!) 88% 92% (!) 85%  Weight:      Height:        Wt Readings from Last 3 Encounters:  02/16/2019 105.7 kg  02/27/2019 106.1 kg  07/16/18 108.5 kg     Intake/Output Summary (Last 24 hours) at 02/19/2019 1434 Last data filed at 02/19/2019 0900 Gross per 24 hour  Intake 720 ml  Output 700 ml  Net 20 ml     Physical Exam  Awake Alert, Oriented X 3, No new F.N deficits, Normal affect Symmetrical Chest wall movement, Good air movement bilaterally, diffuse scattered rales RRR,No Gallops,Rubs or new Murmurs, No Parasternal Heave +ve B.Sounds, Abd Soft, No tenderness, No rebound - guarding or rigidity. No Cyanosis, Clubbing or edema, No new Rash or bruise          Data Review:    CBC Recent Labs  Lab 02/22/2019 0200  02/15/19 0530 02/16/19 0500 02/17/19 0500 02/18/19 0500 02/19/19 0500  WBC 9.9   < > 13.2* 13.9* 12.5* 14.5* 16.7*  HGB 15.0   < > 14.5 14.7 14.3 15.0 15.5  HCT 44.9   < > 43.2 44.8 43.2 44.8 47.8  PLT 208   < > 280 296 228  226 246  MCV 90.2   < > 90.0 89.6 90.9 90.7 91.0  MCH 30.1   < > 30.2 29.4 30.1 30.4 29.5  MCHC 33.4   < > 33.6 32.8 33.1 33.5 32.4  RDW 14.1   < > 14.1 14.0 14.3 14.4 14.6  LYMPHSABS 0.6*  --   --   --   --   --   --   MONOABS 0.6  --   --   --   --   --   --   EOSABS 0.0  --   --   --   --   --   --   BASOSABS 0.0  --   --   --   --   --   --    < > = values in this interval not displayed.    Chemistries  Recent Labs  Lab 02/15/19 0530 02/16/19 0500 02/17/19 0500 02/18/19 0500 02/19/19 0500  NA 140 140 141 141 140  K 4.2 4.4 4.8 4.8 4.9  CL 105 102 106 107 106   CO2 23 25 27 25 25   GLUCOSE 155* 151* 155* 144* 167*  BUN 30* 36* 34* 35* 35*  CREATININE 1.07 1.20 0.99 1.08 1.06  CALCIUM 8.6* 8.6* 8.2* 8.1* 8.3*  MG 1.9 2.2 2.4 2.2  --   AST 37 36 39 42* 34  ALT 20 22 27  35 43  ALKPHOS 58 65 60 67 67  BILITOT 0.5 0.7 0.6 0.7 0.8   ------------------------------------------------------------------------------------------------------------------ No results for input(s): CHOL, HDL, LDLCALC, TRIG, CHOLHDL, LDLDIRECT in the last 72 hours.  Lab Results  Component Value Date   HGBA1C 5.9 09/22/2012   ------------------------------------------------------------------------------------------------------------------ No results for input(s): TSH, T4TOTAL, T3FREE, THYROIDAB in the last 72 hours.  Invalid input(s): FREET3 ------------------------------------------------------------------------------------------------------------------ Recent Labs    02/18/19 0500 02/19/19 0500  FERRITIN 134 124    Coagulation profile No results for input(s): INR, PROTIME in the last 168 hours.  Recent Labs    02/18/19 0500 02/19/19 0500  DDIMER 7.68* 9.51*    Cardiac Enzymes No results for input(s): CKMB, TROPONINI, MYOGLOBIN in the last 168 hours.  Invalid input(s): CK ------------------------------------------------------------------------------------------------------------------    Component Value Date/Time   BNP 66.1 02/19/2019 0500    Inpatient Medications  Scheduled Meds:  Chlorhexidine Gluconate Cloth  6 each Topical Daily   clopidogrel  75 mg Oral Daily   enoxaparin (LOVENOX) injection  40 mg Subcutaneous Q12H   feeding supplement (ENSURE ENLIVE)  237 mL Oral TID BM   insulin aspart  0-20 Units Subcutaneous TID WC   insulin aspart  0-5 Units Subcutaneous QHS   methylPREDNISolone sodium succinate  60 mg Intravenous Q12H   metoprolol succinate  25 mg Oral Daily   pantoprazole  40 mg Oral Daily   polyethylene glycol  17 g Oral  BID   sodium chloride flush  10-40 mL Intracatheter Q12H   sodium chloride flush  3 mL Intravenous Q12H   pamrevlumab or placebo  35 mg/kg Intravenous Q7 days   Followed by   Derrill Memo ON 03/14/2019] pamrevlumab or placebo  35 mg/kg Intravenous Once   ascorbic acid  500 mg Oral Daily   zinc sulfate  220 mg Oral Daily   Continuous Infusions:  sodium chloride     PRN Meds:.sodium chloride, acetaminophen **OR** acetaminophen, ALPRAZolam, guaiFENesin-codeine, hydrALAZINE, Ipratropium-Albuterol, lip balm, nitroGLYCERIN, ondansetron **OR** ondansetron (ZOFRAN) IV, polyethylene glycol, sodium chloride flush, sodium chloride flush, traZODone  Micro Results Recent Results (from the  past 240 hour(s))  SARS Coronavirus 2 [facility] order, Performed in [facility] hospital lab)     Status: Abnormal   Collection Time: 02/03/2019  2:00 AM   Specimen: Nasopharyngeal Swab  Result Value Ref Range Status   SARS Coronavirus 2 POSITIVE (A) NEGATIVE Final    Comment: CRITICAL RESULT CALLED TO, READ BACK BY AND VERIFIED WITH: AMY SMITH @333  02/17/2019 TTG  (NOTE) If result is NEGATIVE SARS-CoV-2 target nucleic acids are NOT DETECTED. The SARS-CoV-2 RNA is generally detectable in upper and lower  respiratory specimens during the acute phase of infection. The lowest  concentration of SARS-CoV-2 viral copies this assay can detect is 250  copies / mL. A negative result does not preclude SARS-CoV-2 infection  and should not be used as the sole basis for treatment or other  patient management decisions.  A negative result may occur with  improper specimen collection / handling, submission of specimen other  than nasopharyngeal swab, presence of viral mutation(s) within the  areas targeted by this assay, and inadequate number of viral copies  (<250 copies / mL). A negative result must be combined with clinical  observations, patient history, and epidemiological information. If result is POSITIVE SARS-CoV-2  target nucleic acids are DETECTED.  The SARS-CoV-2 RNA is generally detectable in upper and lower  respiratory specimens during the acute phase of infection.  Positive  results are indicative of active infection with SARS-CoV-2.  Clinical  correlation with patient history and other diagnostic information is  necessary to determine patient infection status.  Positive results do  not rule out bacterial infection or co-infection with other viruses. If result is PRESUMPTIVE POSTIVE SARS-CoV-2 nucleic acids MAY BE PRESENT.   A presumptive positive result was obtained on the submitted specimen  and confirmed on repeat testing.  While 2019 novel coronavirus  (SARS-CoV-2) nucleic acids may be present in the submitted sample  additional confirmatory testing may be necessary for epidemiological  and / or clinical management purposes  to differentiate between  SARS-CoV-2 and other Sarbecovirus currently known to infect humans.  If clinically indicated additional testing with an alternate test  methodology [phone])  is advised. The SARS-CoV-2 RNA is generally  detectable in upper and lower respiratory specimens during the acute  phase of infection. The expected result is Negative. Fact Sheet for Patients:  StrictlyIdeas.no Fact Sheet for Healthcare Providers: BankingDealers.co.za This test is not yet approved or cleared by the [place] FDA and has been authorized for detection and/or diagnosis of SARS-CoV-2 by FDA under an Emergency Use Authorization (EUA).  This EUA will remain in effect (meaning this test can be used) for the duration of the COVID-19 declaration under Section 564(b)(1) of the Act, 21 U.S.C. section 360bbb-3(b)(1), unless the authorization is terminated or revoked sooner. Performed at [facility], [address] [place], [place] [address]   Blood Culture (routine x 2)     Status: None   Collection Time: 02/12/2019   2:01 AM   Specimen: BLOOD  Result Value Ref Range Status   Specimen Description   Final    BLOOD LEFT ANTECUBITAL Performed at [facility], [place] [address], [place], [place] [address]    Special Requests   Final    BOTTLES DRAWN AEROBIC AND ANAEROBIC Blood Culture results may not be optimal due to an excessive volume of blood received in culture bottles   Culture   Final    NO GROWTH 5 DAYS Performed at [facility], [place]  [address], [place], [place] [address]    Report Status 02/18/2019 FINAL  Final  Blood Culture (routine x 2)     Status: Abnormal   Collection Time: 02/27/2019  3:44 AM   Specimen: BLOOD  Result Value Ref Range Status   Specimen Description   Final    BLOOD RIGHT ANTECUBITAL Performed at [facility], [place] [address] [place], [place] [address]    Special Requests   Final    BOTTLES DRAWN AEROBIC AND ANAEROBIC Blood Culture adequate volume Performed at [facility], [address] [place], [place] [address]    Culture  Setup Time   Final    GRAM POSITIVE COCCI AEROBIC BOTTLE ONLY CRITICAL RESULT CALLED TO, READ BACK BY AND VERIFIED WITH: [name] CHAPPELL ON 02/14/2019 AT 11:26 [place].    Culture (A)  Final    STAPHYLOCOCCUS SPECIES (COAGULASE NEGATIVE) THE SIGNIFICANCE OF ISOLATING THIS ORGANISM FROM A SINGLE SET OF BLOOD CULTURES WHEN MULTIPLE SETS ARE DRAWN IS UNCERTAIN. PLEASE NOTIFY THE MICROBIOLOGY DEPARTMENT WITHIN ONE WEEK IF SPECIATION AND SENSITIVITIES ARE REQUIRED. Performed at [facility], [place] [address], [place], [place] [address]    Report Status 02/16/2019 FINAL  Final  Blood Culture ID Panel (Reflexed)     Status: Abnormal   Collection Time: 02/20/2019  3:44 AM  Result Value Ref Range Status   Enterococcus species NOT DETECTED NOT DETECTED Final   Listeria monocytogenes NOT DETECTED NOT DETECTED Final   Staphylococcus species DETECTED (A) NOT DETECTED Final    Comment: Methicillin (oxacillin) resistant coagulase  negative staphylococcus. Possible blood culture contaminant (unless isolated from more than one blood culture draw or clinical case suggests pathogenicity). No antibiotic treatment is indicated for blood  culture contaminants. CRITICAL RESULT CALLED TO, READ BACK BY AND VERIFIED WITH:  [name] CHAPPELL ON 02/14/2019 AT 11:26. TIK    Staphylococcus aureus (BCID) NOT DETECTED NOT DETECTED Final   Methicillin resistance DETECTED (A) NOT DETECTED Final    Comment: CRITICAL RESULT CALLED TO, READ BACK BY AND VERIFIED WITH: [name] CHAPPELL ON 02/14/2019 AT 11:26. TIK    Streptococcus species NOT DETECTED NOT DETECTED Final   Streptococcus agalactiae NOT DETECTED NOT DETECTED Final   Streptococcus pneumoniae NOT DETECTED NOT DETECTED Final   Streptococcus pyogenes NOT DETECTED NOT DETECTED Final   Acinetobacter baumannii NOT DETECTED NOT DETECTED Final   Enterobacteriaceae species NOT DETECTED NOT DETECTED Final   Enterobacter cloacae complex NOT DETECTED NOT DETECTED Final   Escherichia coli NOT DETECTED NOT DETECTED Final   Klebsiella oxytoca NOT DETECTED NOT DETECTED Final   Klebsiella pneumoniae NOT DETECTED NOT DETECTED Final   Proteus species NOT DETECTED NOT DETECTED Final   Serratia marcescens NOT DETECTED NOT DETECTED Final   Haemophilus influenzae NOT DETECTED NOT DETECTED Final   Neisseria meningitidis NOT DETECTED NOT DETECTED Final   Pseudomonas aeruginosa NOT DETECTED NOT DETECTED Final   Candida albicans NOT DETECTED NOT DETECTED Final   Candida glabrata NOT DETECTED NOT DETECTED Final   Candida krusei NOT DETECTED NOT DETECTED Final   Candida parapsilosis NOT DETECTED NOT DETECTED Final   Candida tropicalis NOT DETECTED NOT DETECTED Final    Comment: Performed at [facility], [address] [place], [place] [address]  MRSA PCR Screening     Status: None   Collection Time: 02/12/2019  4:51 AM   Specimen: Nasopharyngeal  Result Value Ref Range Status   MRSA by PCR  NEGATIVE NEGATIVE Final    Comment:  The GeneXpert MRSA Assay (FDA approved for NASAL specimens only), is one component of a comprehensive MRSA colonization surveillance program. It is not intended to diagnose MRSA infection nor to guide or monitor treatment for MRSA infections. Performed at [facility], [address] [place], [place] [address]   Culture, blood (routine x 2)     Status: Abnormal   Collection Time: 02/14/19  6:50 PM   Specimen: BLOOD RIGHT HAND  Result Value Ref Range Status   Specimen Description   Final    BLOOD RIGHT HAND Performed at [facility] [address] [place], [place] [address]    Special Requests   Final    BOTTLES DRAWN AEROBIC ONLY Blood Culture adequate volume Performed at [facility] [address] [place], [place] [address]    Culture  Setup Time   Final    AEROBIC BOTTLE ONLY GRAM POSITIVE COCCI CRITICAL VALUE NOTED.  VALUE IS CONSISTENT WITH PREVIOUSLY REPORTED AND CALLED VALUE.    Culture (A)  Final    STAPHYLOCOCCUS SPECIES (COAGULASE NEGATIVE) THE SIGNIFICANCE OF ISOLATING THIS ORGANISM FROM A SINGLE SET OF BLOOD CULTURES WHEN MULTIPLE SETS ARE DRAWN IS UNCERTAIN. PLEASE NOTIFY THE MICROBIOLOGY DEPARTMENT WITHIN ONE WEEK IF SPECIATION AND SENSITIVITIES ARE REQUIRED. Performed at [facility], [place] [address] [place], [place] [address]    Report Status 02/17/2019 FINAL  Final  Culture, blood (routine x 2)     Status: None   Collection Time: 02/14/19  6:55 PM   Specimen: BLOOD RIGHT FOREARM  Result Value Ref Range Status   Specimen Description   Final    BLOOD RIGHT FOREARM Performed at [facility], [place] [address], [place], [place] [address]    Special Requests   Final    BOTTLES DRAWN AEROBIC ONLY Blood Culture results may not be optimal due to an excessive volume of blood received in culture bottles Performed at [facility]  [address] [place], [place] [address]    Culture   Final    NO GROWTH 5 DAYS Performed at [facility], [place] [address], [place], [place] [address]    Report Status 02/19/2019 FINAL  Final    Radiology Reports Dg Chest Port 1 View  Result Date: 02/18/2019 CLINICAL DATA:  78 year old male with hypoxia. EXAM: PORTABLE CHEST 1 VIEW COMPARISON:  Chest radiograph dated 02/16/2019 FINDINGS: Shallow inspiration. Bilateral lower lung field airspace densities, left greater right similar to prior radiograph. There is no pneumothorax. No large effusion. Stable cardiac silhouette. Right-sided pacemaker device and left-sided PICC in similar position. No acute osseous pathology. IMPRESSION: No significant interval change in the bilateral lower lung field airspace densities. Electronically Signed   By: Anner Crete M.D.   On: 02/18/2019 01:04   Dg Chest Port 1 View  Result Date: 02/16/2019 CLINICAL DATA:  78 year old male currently admitted with COVID-19 viral pneumonia EXAM: PORTABLE CHEST 1 VIEW COMPARISON:  Prior chest x-ray 02/14/2019 FINDINGS: Right upper extremity subclavian approach cardiac rhythm maintenance device remains in stable position with leads projecting over the right atrium and right ventricle. The patient is not intubated. Interval placement of a left upper extremity PICC. The catheter tip overlies the cavoatrial junction. Perhaps slightly improved inspiratory volumes with improving aeration in the right mid and lower lung. Persistent patchy airspace opacities in both lower lobes and the left mid lung. No pneumothorax. No significant pleural effusion. No acute osseous abnormality. IMPRESSION: 1. Interval placement  of a left upper extremity PICC. The catheter tip overlies the superior cavoatrial junction. 2. Slightly improved inspiratory volumes and improved aeration in the right mid and lower lung. 3. Persistent multifocal patchy airspace opacities most notable in the left mid lung and  bilateral bases. Electronically Signed   By: Jacqulynn Cadet M.D.   On: 02/16/2019 10:15   Portable Chest 1 View  Result Date: 02/14/2019 CLINICAL DATA:  Dyspnea EXAM: PORTABLE CHEST 1 VIEW COMPARISON:  02/02/2019 FINDINGS: Cardiac shadow is stable. Pacing device is again seen. Patchy parenchymal opacities are noted primarily within the right lung but to a lesser degree in the left lung base slightly progressed when compare with the prior study. No bony abnormality is noted. IMPRESSION: Slight progression of parenchymal opacities particularly on the right. Electronically Signed   By: Inez Catalina M.D.   On: 02/14/2019 09:04   Dg Chest Port 1 View  Result Date: 02/04/2019 CLINICAL DATA:  Cough diarrhea nausea and fever EXAM: PORTABLE CHEST 1 VIEW COMPARISON:  04/11/2016 FINDINGS: Right-sided pacing device as before. Patchy bilateral somewhat peripheral airspace opacities. No pleural effusion. Normal heart size. No pneumothorax. IMPRESSION: Low lung volumes. Patchy bilateral ill-defined airspace opacities, concerning for pneumonia. Consider atypical or viral pneumonia in the appropriate clinical setting. Electronically Signed   By: Donavan Foil M.D.   On: 02/12/2019 02:31   [place] Ekg Site Rite  Result Date: 02/14/2019 If Site Rite image not attached, placement could not be confirmed due to current cardiac rhythm.     Phillips Climes M.D on 02/19/2019 at 2:34 PM  Between 7am to 7pm - Pager - [phone]  After 7pm go to [email or web address] - password [facility]  [facility] -  Office  [phone]

## 2019-02-19 NOTE — Progress Notes (Signed)
Patient's wife Enid Derry called to tell him goodnight on his cell phone and wanted to speak with RN.  Updated wife on patient's status and care for tonight. All questions answered and then Enid Derry spoke back with patient.

## 2019-02-20 LAB — COMPREHENSIVE METABOLIC PANEL
ALT: 50 U/L — ABNORMAL HIGH (ref 0–44)
AST: 36 U/L (ref 15–41)
Albumin: 3.1 g/dL — ABNORMAL LOW (ref 3.5–5.0)
Alkaline Phosphatase: 68 U/L (ref 38–126)
Anion gap: 10 (ref 5–15)
BUN: 36 mg/dL — ABNORMAL HIGH (ref 8–23)
CO2: 27 mmol/L (ref 22–32)
Calcium: 8.5 mg/dL — ABNORMAL LOW (ref 8.9–10.3)
Chloride: 104 mmol/L (ref 98–111)
Creatinine, Ser: 1.08 mg/dL (ref 0.61–1.24)
GFR calc Af Amer: >60 mL/min (ref >60)
GFR calc non Af Amer: >60 mL/min (ref >60)
Glucose, Bld: 142 mg/dL — ABNORMAL HIGH (ref 70–99)
Potassium: 4.8 mmol/L (ref 3.5–5.1)
Sodium: 141 mmol/L (ref 135–145)
Total Bilirubin: 1.3 mg/dL — ABNORMAL HIGH (ref 0.3–1.2)
Total Protein: 5.7 g/dL — ABNORMAL LOW (ref 6.5–8.1)

## 2019-02-20 LAB — CBC
HCT: 50.4 % (ref 39.0–52.0)
Hemoglobin: 16.3 g/dL (ref 13.0–17.0)
MCH: 29.5 pg (ref 26.0–34.0)
MCHC: 32.3 g/dL (ref 30.0–36.0)
MCV: 91.1 fL (ref 80.0–100.0)
Platelets: 233 10*3/uL (ref 150–400)
RBC: 5.53 MIL/uL (ref 4.22–5.81)
RDW: 14.8 % (ref 11.5–15.5)
WBC: 17.5 10*3/uL — ABNORMAL HIGH (ref 4.0–10.5)
nRBC: 0.1 % (ref 0.0–0.2)

## 2019-02-20 LAB — GLUCOSE, CAPILLARY
Glucose-Capillary: 132 mg/dL — ABNORMAL HIGH (ref 70–99)
Glucose-Capillary: 262 mg/dL — ABNORMAL HIGH (ref 70–99)
Glucose-Capillary: 121 mg/dL — ABNORMAL HIGH (ref 70–99)
Glucose-Capillary: 164 mg/dL — ABNORMAL HIGH (ref 70–99)

## 2019-02-20 LAB — D-DIMER, QUANTITATIVE: D-Dimer, Quant: 6.16 ug/mL-FEU — ABNORMAL HIGH (ref 0.00–0.50)

## 2019-02-20 LAB — FERRITIN: Ferritin: 119 ng/mL (ref 24–336)

## 2019-02-20 MED ORDER — SALINE SPRAY 0.65 % NA SOLN
1.0000 | NASAL | Status: DC | PRN
  Administered 2019-02-20: 1 via NASAL
  Filled 2019-02-20: qty 44

## 2019-02-20 NOTE — Progress Notes (Signed)
NAME:  Clayton Sawyer, MRN:  [id number], DOB:  [birth year], LOS: 7 ADMISSION DATE:  01/30/2019, CONSULTATION DATE:  02/08/2019 REFERRING MD:  Dr. Marcille Blanco, Hospitalist, CHIEF COMPLAINT:  Cough   Brief History   78 yo male presented to [facility] with shortness of breath, cough, weakness, nausea, diarrhea, and fever.  Spouse recently hospitalized at [facility] with SARS Coronavirus 2.  Found to have pulmonary infiltrates and hypoxia from SARS Coronavirus 2 and transferred to [facility].  Past Medical History  CAD, Nephrolithiasis, DiastolicCHF, HTN, CVA, HLD, Prostate cancer, Migraine HA, OA, s/p PM, Shingles  Significant Hospital Events   7/15 Admit, transfer to [facility] 7/16 enrolled in fibrinogen research drug  7/19 tocilizumab  Consults:    Procedures:    Significant Diagnostic Tests:    Micro Data:  SARS Coronavirus 2 7/15 >> Positive Blood 7/16 >> Coag neg Staph   Antimicrobials:  Remdesivir 7/14 >> 7/19 Tocilizumab 7/15 Solumedrol 7/16 >>  Tocilizumab 7/19  Interim history/subjective:  Trying to keep up with incentive spirometry.  Not much cough.  Appetite okay.  Objective   Blood pressure (!) 156/69, pulse 72, temperature 97.8 F (36.6 C), temperature source Axillary, resp. rate (!) 25, height 5\' 9"  (1.753 m), weight 105.7 kg, SpO2 91 %.    FiO2 (%):  [90 %-100 %] 90 %   Intake/Output Summary (Last 24 hours) at 02/20/2019 1135 Last data filed at 02/20/2019 0900 Gross per 24 hour  Intake 240 ml  Output 1125 ml  Net -885 ml   Filed Weights   02/16/2019 2152  Weight: 105.7 kg    Examination:  General - alert Eyes - pupils reactive ENT - no sinus tenderness, no stridor Cardiac - regular rate/rhythm, no murmur Chest - scattered rhonchi Abdomen - soft, non tender, + bowel sounds Extremities - no cyanosis, clubbing, or edema Skin - no rashes Neuro - normal strength, moves extremities, follows commands   Resolved Hospital Problem list   Coag neg Staph in Blood culture from  contaminated specimen  Assessment & Plan:   Acute hypoxic respiratory failure from COVID PNA. - ferritin, CRP trending down - allowing for permissive hypoxia - intubate only if needs ventilatory assistance - day 8 of solumedrol - next dose of fibrinogen research drug due 7/23 - prn BDs - f/u CXR intermittently - incentive spirometry - prone position as able - mobilize as able - continue lovenox  Hx of CAD, HTN, CVA. - continue plavix, toprol  Steroid induced hyperglycemia. - SSI  Best practice:  Diet: heart healthy, carb modified DVT prophylaxis: Lovenox GI prophylaxis: Protonix Mobility: OOB to chair Code Status: full  Disposition: ICU  Labs    CMP Latest Ref Rng & Units 02/20/2019 02/19/2019 02/18/2019  Glucose 70 - 99 mg/dL 142(H) 167(H) 144(H)  BUN 8 - 23 mg/dL 36(H) 35(H) 35(H)  Creatinine 0.61 - 1.24 mg/dL 1.08 1.06 1.08  Sodium 135 - 145 mmol/L 141 140 141  Potassium 3.5 - 5.1 mmol/L 4.8 4.9 4.8  Chloride 98 - 111 mmol/L 104 106 107  CO2 22 - 32 mmol/L 27 25 25   Calcium 8.9 - 10.3 mg/dL 8.5(L) 8.3(L) 8.1(L)  Total Protein 6.5 - 8.1 g/dL 5.7(L) 5.4(L) 5.1(L)  Total Bilirubin 0.3 - 1.2 mg/dL 1.3(H) 0.8 0.7  Alkaline Phos 38 - 126 U/L 68 67 67  AST 15 - 41 U/L 36 34 42(H)  ALT 0 - 44 U/L 50(H) 43 35   CBC Latest Ref Rng & Units 02/20/2019 02/19/2019 02/18/2019  WBC 4.0 -  10.5 K/uL 17.5(H) 16.7(H) 14.5(H)  Hemoglobin 13.0 - 17.0 g/dL 16.3 15.5 15.0  Hematocrit 39.0 - 52.0 % 50.4 47.8 44.8  Platelets 150 - 400 K/uL 233 246 226    CBG (last 3)  Recent Labs    02/19/19 2149 02/20/19 0739 02/20/19 1123  GLUCAP 144* 132* 262*   D/w Dr. Eston Esters, MD [place] 02/20/2019, 11:35 AM

## 2019-02-20 NOTE — Progress Notes (Signed)
Spoke with pt's wife and granddaughter separately by phone.  Updated them about current status and treatment plan.  Explained that some of his inflammatory markers are improving, and he is not requiring as much supplemental oxygen compare to earlier in the week.  Explained that he still has a long way to go for recovery, but that he might be heading in the right direction now.  Also explained that he will be getting next dose of study medication on 7/23.  Chesley Mires, MD [facility] Pulmonary/Critical Care 02/20/2019, 1:31 PM

## 2019-02-20 NOTE — Progress Notes (Signed)
Pt's wife Enid Derry called and update on pt's condition and plan of care. She thanked [place] for the care her husband is getting here at [facility].

## 2019-02-20 NOTE — Progress Notes (Signed)
PROGRESS NOTE  Clayton Sawyer LPF:[id number] DOB: [birth year] DOA: 02/06/2019  PCP: Jodi Marble, MD  Brief History/Interval Summary: 78 y.o.malewith history of  hypertension, prostate cancer, history of prior stroke  Presents from [facility] with shortness of breath, hypoxic, COVID-19 positive, wife was recently discharged COVID-19 infection, admitted to ICU for  management of heated high flow nasal cannula.  Reason for Visit: Acute respiratory disease due to COVID-19  Consultants: Pulmonology  Procedures: None yet  Antibiotics: Anti-infectives (From admission, onward)   Start     Dose/Rate Route Frequency Ordered Stop   02/17/19 1000  vancomycin (VANCOCIN) 1,500 mg in sodium chloride 0.9 % 500 mL IVPB  Status:  Discontinued     1,500 mg 250 mL/hr over 120 Minutes Intravenous Every 24 hours 02/16/19 0934 02/17/19 1338   02/16/19 1000  vancomycin (VANCOCIN) 2,000 mg in sodium chloride 0.9 % 500 mL IVPB     2,000 mg 250 mL/hr over 120 Minutes Intravenous  Once 02/16/19 0934 02/16/19 1250   02/14/19 0700  remdesivir 100 mg in sodium chloride 0.9 % 250 mL IVPB     100 mg 500 mL/hr over 30 Minutes Intravenous Every 24 hours 02/14/19 0010 02/17/19 1625       Subjective/Interval History: Patient states that he is feeling about the same.  Denies any worsening of his symptoms.  No chest pain.  No nausea vomiting.  Continues to have a dry cough at times.    Assessment/Plan:  Acute Hypoxic Resp. Failure due to Acute Covid 19 Viral Illness  COVID-19 Labs  Recent Labs    02/18/19 0500 02/19/19 0500 02/20/19 0500  DDIMER 7.68* 9.51* 6.16*  FERRITIN 134 124 119  CRP <0.8 <0.8  --     Lab Results  Component Value Date   SARSCOV2NAA POSITIVE (A) 01/31/2019   SARSCOV2NAA Detected (A) 02/08/2019     Fever: Has been afebrile. Oxygen requirements: On heated high flow nasal cannula.  40 L/min.  80% FiO2.  Saturating in the late 80s to early 90s. Antibiotics: Not on any  antibiotics currently Remdesivir: Completed course of Remdesivir Steroids: On Solu-Medrol 60 mg every 12 hours Diuretics: Not getting diuretics on a scheduled basis Actemra: Has received 2 doses Studies: Enrolled into fibrogen study Vitamin C and Zinc: Continue DVT Prophylaxis:  Lovenox 40 mg every 12 hours  Patient continues to have high oxygen requirements.  He is however feeling slightly better.  He does not appear to be in any distress.  Inflammatory markers have improved.  D-dimer improved to 6.16.  Ferritin 119.  Patient has completed course of Remdesivir.  He is still on steroids.  He has received 2 doses of Actemra.  He has been enrolled into the fibrogen research study by the principal investigator, Dr. Chase Caller.  Pulmonology is following. Patient encouraged to be in prone positioning as much as possible.  Encourage incentive spirometry.  Leukocytosis is most likely due to steroids.  Bacteremia 1 out of 4 blood culture sets were positive for coag negative staph.  Another 1 out of 2 bottles were positive for Staphylococcus hominis.  Both are contaminants.  Vancomycin was discontinued.  Hyperglycemia due to steroids Continue SSI.  Monitor CBGs.  History of stroke Patient is stable.  Apparently intolerant to statins.  Continue Plavix.  Essential hypertension Blood pressure stable.  He is on Toprol XL.  History of coronary artery disease Has had stent placement previously.  Denies any chest pain.  Continue beta-blocker and antiplatelet agents.  Patient is  intolerant to statins.  DVT Prophylaxis: Lovenox PUD Prophylaxis: On Protonix Code Status: Full code Family Communication: Discussed with the patient Disposition Plan: Management as outlined above.  Will remain in the ICU for now.   Medications:  Scheduled: . Chlorhexidine Gluconate Cloth  6 each Topical Daily  . clopidogrel  75 mg Oral Daily  . enoxaparin (LOVENOX) injection  40 mg Subcutaneous Q12H  . feeding supplement  (ENSURE ENLIVE)  237 mL Oral TID BM  . insulin aspart  0-20 Units Subcutaneous TID WC  . insulin aspart  0-5 Units Subcutaneous QHS  . mouth rinse  15 mL Mouth Rinse BID  . methylPREDNISolone sodium succinate  60 mg Intravenous Q12H  . metoprolol succinate  25 mg Oral Daily  . pantoprazole  40 mg Oral Daily  . polyethylene glycol  17 g Oral BID  . sodium chloride flush  10-40 mL Intracatheter Q12H  . sodium chloride flush  3 mL Intravenous Q12H  . pamrevlumab or placebo  35 mg/kg Intravenous Q7 days   Followed by  . [START ON 03/14/2019] pamrevlumab or placebo  35 mg/kg Intravenous Once  . ascorbic acid  500 mg Oral Daily  . zinc sulfate  220 mg Oral Daily   Continuous: . sodium chloride     IHK:VQQVZD chloride, acetaminophen **OR** acetaminophen, ALPRAZolam, guaiFENesin-codeine, hydrALAZINE, Ipratropium-Albuterol, lip balm, nitroGLYCERIN, ondansetron **OR** ondansetron (ZOFRAN) IV, polyethylene glycol, sodium chloride, sodium chloride flush, sodium chloride flush, traZODone   Objective:  Vital Signs  Vitals:   02/20/19 0746 02/20/19 0800 02/20/19 0900 02/20/19 1135  BP: (!) 144/98 (!) 151/108 (!) 156/69 (!) 158/78  Pulse: 87 (!) 58 72 98  Resp: (!) 25   (!) 27  Temp:  97.8 F (36.6 C)    TempSrc:  Axillary    SpO2: 92% 91% 91% (!) 88%  Weight:      Height:        Intake/Output Summary (Last 24 hours) at 02/20/2019 1155 Last data filed at 02/20/2019 0900 Gross per 24 hour  Intake 240 ml  Output 1125 ml  Net -885 ml   Filed Weights   02/10/2019 2152  Weight: 105.7 kg    General appearance: Awake alert.  In no distress Resp: Coarse breath sounds bilaterally.  Crackles heard bilateral bases.  Rhonchi heard in the right lung.  Normal effort. Cardio: S1-S2 is normal regular.  No S3-S4.  No rubs murmurs or bruit GI: Abdomen is soft.  Nontender nondistended.  Bowel sounds are present normal.  No masses organomegaly Extremities: No edema.  Full range of motion of lower  extremities. Neurologic: Alert and oriented x3.  No focal neurological deficits.    Lab Results:  Data Reviewed: I have personally reviewed following labs and imaging studies  CBC: Recent Labs  Lab 02/16/19 0500 02/17/19 0500 02/18/19 0500 02/19/19 0500 02/20/19 0500  WBC 13.9* 12.5* 14.5* 16.7* 17.5*  HGB 14.7 14.3 15.0 15.5 16.3  HCT 44.8 43.2 44.8 47.8 50.4  MCV 89.6 90.9 90.7 91.0 91.1  PLT 296 228 226 246 638    Basic Metabolic Panel: Recent Labs  Lab 02/15/19 0530 02/16/19 0500 02/17/19 0500 02/18/19 0500 02/19/19 0500 02/20/19 0500  NA 140 140 141 141 140 141  K 4.2 4.4 4.8 4.8 4.9 4.8  CL 105 102 106 107 106 104  CO2 23 25 27 25 25 27   GLUCOSE 155* 151* 155* 144* 167* 142*  BUN 30* 36* 34* 35* 35* 36*  CREATININE 1.07 1.20 0.99 1.08 1.06  1.08  CALCIUM 8.6* 8.6* 8.2* 8.1* 8.3* 8.5*  MG 1.9 2.2 2.4 2.2  --   --   PHOS 3.3 2.5 3.5 3.9  --   --     GFR: Estimated Creatinine Clearance: 67.5 mL/min (by C-G formula based on SCr of 1.08 mg/dL).  Liver Function Tests: Recent Labs  Lab 02/16/19 0500 02/17/19 0500 02/18/19 0500 02/19/19 0500 02/20/19 0500  AST 36 39 42* 34 36  ALT 22 27 35 43 50*  ALKPHOS 65 60 67 67 68  BILITOT 0.7 0.6 0.7 0.8 1.3*  PROT 5.7* 5.1* 5.1* 5.4* 5.7*  ALBUMIN 2.9* 2.6* 2.6* 2.9* 3.1*     CBG: Recent Labs  Lab 02/19/19 1149 02/19/19 1642 02/19/19 2149 02/20/19 0739 02/20/19 1123  GLUCAP 192* 240* 144* 132* 262*    Anemia Panel: Recent Labs    02/19/19 0500 02/20/19 0500  FERRITIN 124 119    Recent Results (from the past 240 hour(s))  SARS Coronavirus 2 [facility] order, Performed in [place] hospital lab)     Status: Abnormal   Collection Time: 02/24/2019  2:00 AM   Specimen: Nasopharyngeal Swab  Result Value Ref Range Status   SARS Coronavirus 2 POSITIVE (A) NEGATIVE Final    Comment: CRITICAL RESULT CALLED TO, READ BACK BY AND VERIFIED WITH: AMY SMITH @333  02/12/2019 TTG  (NOTE) If result is  NEGATIVE SARS-CoV-2 target nucleic acids are NOT DETECTED. The SARS-CoV-2 RNA is generally detectable in upper and lower  respiratory specimens during the acute phase of infection. The lowest  concentration of SARS-CoV-2 viral copies this assay can detect is 250  copies / mL. A negative result does not preclude SARS-CoV-2 infection  and should not be used as the sole basis for treatment or other  patient management decisions.  A negative result may occur with  improper specimen collection / handling, submission of specimen other  than nasopharyngeal swab, presence of viral mutation(s) within the  areas targeted by this assay, and inadequate number of viral copies  (<250 copies / mL). A negative result must be combined with clinical  observations, patient history, and epidemiological information. If result is POSITIVE SARS-CoV-2 target nucleic acids are DETECTED.  The SARS-CoV-2 RNA is generally detectable in upper and lower  respiratory specimens during the acute phase of infection.  Positive  results are indicative of active infection with SARS-CoV-2.  Clinical  correlation with patient history and other diagnostic information is  necessary to determine patient infection status.  Positive results do  not rule out bacterial infection or co-infection with other viruses. If result is PRESUMPTIVE POSTIVE SARS-CoV-2 nucleic acids MAY BE PRESENT.   A presumptive positive result was obtained on the submitted specimen  and confirmed on repeat testing.  While 2019 novel coronavirus  (SARS-CoV-2) nucleic acids may be present in the submitted sample  additional confirmatory testing may be necessary for epidemiological  and / or clinical management purposes  to differentiate between  SARS-CoV-2 and other Sarbecovirus currently known to infect humans.  If clinically indicated additional testing with an alternate test  methodology [phone])  is advised. The SARS-CoV-2 RNA is generally  detectable  in upper and lower respiratory specimens during the acute  phase of infection. The expected result is Negative. Fact Sheet for Patients:  StrictlyIdeas.no Fact Sheet for Healthcare Providers: BankingDealers.co.za This test is not yet approved or cleared by the [place] FDA and has been authorized for detection and/or diagnosis of SARS-CoV-2 by FDA under an Emergency Use Authorization (  EUA).  This EUA will remain in effect (meaning this test can be used) for the duration of the COVID-19 declaration under Section 564(b)(1) of the Act, 21 U.S.C. section 360bbb-3(b)(1), unless the authorization is terminated or revoked sooner. Performed at [facility], [address] [place], [place] [address]   Blood Culture (routine x 2)     Status: None   Collection Time: 02/22/2019  2:01 AM   Specimen: BLOOD  Result Value Ref Range Status   Specimen Description   Final    BLOOD LEFT ANTECUBITAL Performed at [facility], [place] [address] [place], [place] [address]    Special Requests   Final    BOTTLES DRAWN AEROBIC AND ANAEROBIC Blood Culture results may not be optimal due to an excessive volume of blood received in culture bottles   Culture   Final    NO GROWTH 5 DAYS Performed at [facility], [address] [place], [place] [address]    Report Status 02/18/2019 FINAL  Final  Blood Culture (routine x 2)     Status: Abnormal   Collection Time: 02/23/2019  3:44 AM   Specimen: BLOOD  Result Value Ref Range Status   Specimen Description   Final    BLOOD RIGHT ANTECUBITAL Performed at [facility], [place] [address] [place], [place] [address]    Special Requests   Final    BOTTLES DRAWN AEROBIC AND ANAEROBIC Blood Culture adequate volume Performed at [facility], [address] [place], [place] [address]    Culture  Setup Time   Final    GRAM POSITIVE COCCI AEROBIC BOTTLE ONLY CRITICAL RESULT CALLED  TO, READ BACK BY AND VERIFIED WITH: [name] CHAPPELL ON 02/14/2019 AT 11:26 [place].    Culture (A)  Final    STAPHYLOCOCCUS SPECIES (COAGULASE NEGATIVE) THE SIGNIFICANCE OF ISOLATING THIS ORGANISM FROM A SINGLE SET OF BLOOD CULTURES WHEN MULTIPLE SETS ARE DRAWN IS UNCERTAIN. PLEASE NOTIFY THE MICROBIOLOGY DEPARTMENT WITHIN ONE WEEK IF SPECIATION AND SENSITIVITIES ARE REQUIRED. Performed at [facility], [place] [employer] [address] [place],  [address]    Report Status 02/16/2019 FINAL  Final  Blood Culture ID Panel (Reflexed)     Status: Abnormal   Collection Time: 02/05/2019  3:44 AM  Result Value Ref Range Status   Enterococcus species NOT DETECTED NOT DETECTED Final   Listeria monocytogenes NOT DETECTED NOT DETECTED Final   Staphylococcus species DETECTED (A) NOT DETECTED Final    Comment: Methicillin (oxacillin) resistant coagulase negative staphylococcus. Possible blood culture contaminant (unless isolated from more than one blood culture draw or clinical case suggests pathogenicity). No antibiotic treatment is indicated for blood  culture contaminants. CRITICAL RESULT CALLED TO, READ BACK BY AND VERIFIED WITH:  [name] CHAPPELL ON 02/14/2019 AT 11:26. TIK    Staphylococcus aureus (BCID) NOT DETECTED NOT DETECTED Final   Methicillin resistance DETECTED (A) NOT DETECTED Final    Comment: CRITICAL RESULT CALLED TO, READ BACK BY AND VERIFIED WITH: [name] CHAPPELL ON 02/14/2019 AT 11:26. TIK    Streptococcus species NOT DETECTED NOT DETECTED Final   Streptococcus agalactiae NOT DETECTED NOT DETECTED Final   Streptococcus pneumoniae NOT DETECTED NOT DETECTED Final   Streptococcus pyogenes NOT DETECTED NOT DETECTED Final   Acinetobacter baumannii NOT DETECTED NOT DETECTED Final   Enterobacteriaceae species NOT DETECTED NOT DETECTED Final   Enterobacter cloacae complex NOT DETECTED NOT DETECTED Final   Escherichia coli NOT DETECTED NOT DETECTED Final   Klebsiella oxytoca NOT  DETECTED NOT DETECTED  Final   Klebsiella pneumoniae NOT DETECTED NOT DETECTED Final   Proteus species NOT DETECTED NOT DETECTED Final   Serratia marcescens NOT DETECTED NOT DETECTED Final   Haemophilus influenzae NOT DETECTED NOT DETECTED Final   Neisseria meningitidis NOT DETECTED NOT DETECTED Final   Pseudomonas aeruginosa NOT DETECTED NOT DETECTED Final   Candida albicans NOT DETECTED NOT DETECTED Final   Candida glabrata NOT DETECTED NOT DETECTED Final   Candida krusei NOT DETECTED NOT DETECTED Final   Candida parapsilosis NOT DETECTED NOT DETECTED Final   Candida tropicalis NOT DETECTED NOT DETECTED Final    Comment: Performed at [facility], [address] [place], [place] [address]  MRSA PCR Screening     Status: None   Collection Time: 02/18/2019  4:51 AM   Specimen: Nasopharyngeal  Result Value Ref Range Status   MRSA by PCR NEGATIVE NEGATIVE Final    Comment:        The GeneXpert MRSA Assay (FDA approved for NASAL specimens only), is one component of a comprehensive MRSA colonization surveillance program. It is not intended to diagnose MRSA infection nor to guide or monitor treatment for MRSA infections. Performed at [facility], [address] [place], [place] [address]   Culture, blood (routine x 2)     Status: Abnormal   Collection Time: 02/14/19  6:50 PM   Specimen: BLOOD RIGHT HAND  Result Value Ref Range Status   Specimen Description   Final    BLOOD RIGHT HAND Performed at [place] [address], [place], [place] [address]    Special Requests   Final    BOTTLES DRAWN AEROBIC ONLY Blood Culture adequate volume Performed at [place] [address] [place], [place] [address]    Culture  Setup Time   Final    AEROBIC BOTTLE ONLY GRAM POSITIVE COCCI CRITICAL VALUE NOTED.  VALUE IS CONSISTENT WITH PREVIOUSLY REPORTED AND CALLED VALUE.    Culture (A)  Final    STAPHYLOCOCCUS SPECIES (COAGULASE NEGATIVE) THE  SIGNIFICANCE OF ISOLATING THIS ORGANISM FROM A SINGLE SET OF BLOOD CULTURES WHEN MULTIPLE SETS ARE DRAWN IS UNCERTAIN. PLEASE NOTIFY THE MICROBIOLOGY DEPARTMENT WITHIN ONE WEEK IF SPECIATION AND SENSITIVITIES ARE REQUIRED. Performed at [facility], [place] [address], [place], [place] [address]    Report Status 02/17/2019 FINAL  Final  Culture, blood (routine x 2)     Status: None   Collection Time: 02/14/19  6:55 PM   Specimen: BLOOD RIGHT FOREARM  Result Value Ref Range Status   Specimen Description   Final    BLOOD RIGHT FOREARM Performed at [facility], [place] [address] [place], [place] [address]    Special Requests   Final    BOTTLES DRAWN AEROBIC ONLY Blood Culture results may not be optimal due to an excessive volume of blood received in culture bottles Performed at [place] [address], [place], [place] [address]    Culture   Final    NO GROWTH 5 DAYS Performed at [facility], [place] [address] [place],  [address]    Report Status 02/19/2019 FINAL  Final      Radiology Studies: No results found.     LOS: 7 days   Charae Depaolis [employer] on [email or web address]  02/20/2019, 11:55 AM

## 2019-02-20 NOTE — Research (Signed)
Title: [id number] (FibroGen Study) Randomized, Double-Blind, Placebo-Controlled Phase 2 Study of the Efficacy and Safety of Intravenous Pamrevlumab, a Monoclonal Antibody Against Connective Tissue Growth Factor (CTGF), in Hospitalized Patients with Acute COVID-19 Disease .  alized Patients with Acute COVID-19 Disease  Protocol #: [id number], Clinical Trials #: [id number] Sponsor: www.fibrogen.com  ([place], [place], [place])  Scientist, physiological / Electrical engineer note : This visit for Subject Clayton Sawyer with DOB: 05-19-1941 on 02/20/2019 for the above protocol is Visit/Encounter # Day 7 infusion  and is for purpose of research . Subjectexpressed continued interest and consent in continuing as a study subject. Subject confirmed that there was no change in contact information (e.g. address, telephone, email). Subject thanked for participation in research and contribution to science.   All procedures completed per the above mentioned protocol. Refer to the subjects paper source binder for details of the visit. Subject tolerated infusion well, given over 1 hour. No new AEs noted. Next infusion is scheduled for February 27, 2019.   Signed by  Jon Billings, [address], [place] [facility]  [place], [place] 1:11 PM 02/20/2019

## 2019-02-21 DIAGNOSIS — R0600 Dyspnea, unspecified: Secondary | ICD-10-CM | POA: Diagnosis present

## 2019-02-21 DIAGNOSIS — I2510 Atherosclerotic heart disease of native coronary artery without angina pectoris: Secondary | ICD-10-CM

## 2019-02-21 DIAGNOSIS — R0689 Other abnormalities of breathing: Secondary | ICD-10-CM

## 2019-02-21 LAB — COMPREHENSIVE METABOLIC PANEL
ALT: 47 U/L — ABNORMAL HIGH (ref 0–44)
AST: 32 U/L (ref 15–41)
Albumin: 3 g/dL — ABNORMAL LOW (ref 3.5–5.0)
Alkaline Phosphatase: 61 U/L (ref 38–126)
Anion gap: 8 (ref 5–15)
BUN: 34 mg/dL — ABNORMAL HIGH (ref 8–23)
CO2: 28 mmol/L (ref 22–32)
Calcium: 8.2 mg/dL — ABNORMAL LOW (ref 8.9–10.3)
Chloride: 100 mmol/L (ref 98–111)
Creatinine, Ser: 1.06 mg/dL (ref 0.61–1.24)
GFR calc Af Amer: >60 mL/min (ref >60)
GFR calc non Af Amer: >60 mL/min (ref >60)
Glucose, Bld: 144 mg/dL — ABNORMAL HIGH (ref 70–99)
Potassium: 5.1 mmol/L (ref 3.5–5.1)
Sodium: 136 mmol/L (ref 135–145)
Total Bilirubin: 1.2 mg/dL (ref 0.3–1.2)
Total Protein: 5.6 g/dL — ABNORMAL LOW (ref 6.5–8.1)

## 2019-02-21 LAB — CBC WITH DIFFERENTIAL/PLATELET
Abs Immature Granulocytes: 1.01 10*3/uL — ABNORMAL HIGH (ref 0.00–0.07)
Basophils Absolute: 0.1 10*3/uL (ref 0.0–0.1)
Basophils Relative: 1 %
Eosinophils Absolute: 0 10*3/uL (ref 0.0–0.5)
Eosinophils Relative: 0 %
HCT: 48.9 % (ref 39.0–52.0)
Hemoglobin: 15.8 g/dL (ref 13.0–17.0)
Immature Granulocytes: 6 %
Lymphocytes Relative: 3 %
Lymphs Abs: 0.6 10*3/uL — ABNORMAL LOW (ref 0.7–4.0)
MCH: 29.7 pg (ref 26.0–34.0)
MCHC: 32.3 g/dL (ref 30.0–36.0)
MCV: 91.9 fL (ref 80.0–100.0)
Monocytes Absolute: 0.5 10*3/uL (ref 0.1–1.0)
Monocytes Relative: 3 %
Neutro Abs: 16.1 10*3/uL — ABNORMAL HIGH (ref 1.7–7.7)
Neutrophils Relative %: 87 %
Platelets: 206 10*3/uL (ref 150–400)
RBC: 5.32 MIL/uL (ref 4.22–5.81)
RDW: 15.4 % (ref 11.5–15.5)
WBC: 18.4 10*3/uL — ABNORMAL HIGH (ref 4.0–10.5)
nRBC: 0.2 % (ref 0.0–0.2)

## 2019-02-21 LAB — GLUCOSE, CAPILLARY
Glucose-Capillary: 154 mg/dL — ABNORMAL HIGH (ref 70–99)
Glucose-Capillary: 201 mg/dL — ABNORMAL HIGH (ref 70–99)
Glucose-Capillary: 108 mg/dL — ABNORMAL HIGH (ref 70–99)
Glucose-Capillary: 167 mg/dL — ABNORMAL HIGH (ref 70–99)

## 2019-02-21 LAB — D-DIMER, QUANTITATIVE: D-Dimer, Quant: 3.46 ug/mL-FEU — ABNORMAL HIGH (ref 0.00–0.50)

## 2019-02-21 MED ORDER — FUROSEMIDE 10 MG/ML IJ SOLN
40.0000 mg | Freq: Once | INTRAMUSCULAR | Status: AC
  Administered 2019-02-21: 40 mg via INTRAVENOUS
  Filled 2019-02-21: qty 4

## 2019-02-21 MED ORDER — METHYLPREDNISOLONE SODIUM SUCC 125 MG IJ SOLR
60.0000 mg | INTRAMUSCULAR | Status: DC
  Administered 2019-02-22 – 2019-02-26 (×5): 60 mg via INTRAVENOUS
  Filled 2019-02-21 (×5): qty 2

## 2019-02-21 NOTE — Progress Notes (Signed)
Called patient's wife and updated her on patient's move to the floor.

## 2019-02-21 NOTE — Progress Notes (Addendum)
PROGRESS NOTE  Clayton Sawyer SNK:[id number] DOB: 08/19/1940 DOA: 02/28/2019  PCP: Jodi Marble, MD  Brief History/Interval Summary: 78 y.o.malewith history of  hypertension, prostate cancer, history of prior stroke  Presents from [facility] with shortness of breath, hypoxic, COVID-19 positive, wife was recently discharged COVID-19 infection, admitted to ICU for  management of heated high flow nasal cannula.  Reason for Visit: Acute respiratory disease due to COVID-19  Consultants: Pulmonology  Procedures: None yet  Antibiotics: Anti-infectives (From admission, onward)   Start     Dose/Rate Route Frequency Ordered Stop   02/17/19 1000  vancomycin (VANCOCIN) 1,500 mg in sodium chloride 0.9 % 500 mL IVPB  Status:  Discontinued     1,500 mg 250 mL/hr over 120 Minutes Intravenous Every 24 hours 02/16/19 0934 02/17/19 1338   02/16/19 1000  vancomycin (VANCOCIN) 2,000 mg in sodium chloride 0.9 % 500 mL IVPB     2,000 mg 250 mL/hr over 120 Minutes Intravenous  Once 02/16/19 0934 02/16/19 1250   02/14/19 0700  remdesivir 100 mg in sodium chloride 0.9 % 250 mL IVPB     100 mg 500 mL/hr over 30 Minutes Intravenous Every 24 hours 02/14/19 0010 02/17/19 1625       Subjective/Interval History: Patient states that he is feeling slightly better.  Still short of breath.  Still coughing.  No chest pain.  No nausea vomiting.   Assessment/Plan:  Acute Hypoxic Resp. Failure due to Acute Covid 19 Viral Illness  COVID-19 Labs  Recent Labs    02/19/19 0500 02/20/19 0500 02/21/19 0611  DDIMER 9.51* 6.16* 3.46*  FERRITIN 124 119  --   CRP <0.8  --   --     Lab Results  Component Value Date   SARSCOV2NAA POSITIVE (A) 02/10/2019   SARSCOV2NAA Detected (A) 02/08/2019     Fever: Afebrile the last few days Oxygen requirements: Remains on heated HF [place].  15 L/min.  100% FiO2.  Saturating in the late 80s to early 90s.   Antibiotics: Not on any antibiotics currently Remdesivir:  Completed course of Remdesivir Steroids: On Solu-Medrol 60 mg every 12 hours.  Consider tapering starting today or tomorrow. Diuretics: Not getting diuretics on a scheduled basis Actemra: Has received 2 doses Vitamin C and Zinc: Continue DVT Prophylaxis:  Lovenox 40 mg every 12 hours  Research studies: Enrolled into fibrogen study by Dr. Chase Caller  Patient continues to have high oxygen requirements.  He is however feeling slightly better.  Inflammatory markers had improved.  D-dimer improved to 3.46.  Patient has completed course of Remdesivir.  He is still on steroids which will be tapered down slowly.  He has received 2 doses of Actemra.  Pulmonology is following.  Incentive spirometry.  Prone positioning as much as possible.  PT and OT evaluation.  Leukocytosis is most likely due to steroids.  Bacteremia 1 out of 4 blood culture sets were positive for coag negative staph.  Another 1 out of 2 bottles were positive for Staphylococcus hominis.  Both are contaminants.  Vancomycin was discontinued.  Hyperglycemia due to steroids Continue SSI.  CBG should improve as steroid is tapered down.    History of stroke Patient is stable.  Apparently intolerant to statins.  Continue Plavix.  Essential hypertension Blood pressure is reasonably well controlled.  Continue metoprolol.    History of coronary artery disease Stable.  Has had stent placement previously.  Continue beta-blocker and antiplatelet agents.  Patient is intolerant to statins.  DVT Prophylaxis: Lovenox  PUD Prophylaxis: On Protonix Code Status: Full code Family Communication: Discussed with the patient Disposition Plan: Management as outlined above.  Patient was able to wean down to 15 L of oxygen by HFNC.  Patient wishes to go to the medical floor instead of staying the ICU.  Discussed with Dr. Lake Bells.  Okay for transfer to progressive care unit.   Medications:  Scheduled: . Chlorhexidine Gluconate Cloth  6 each Topical Daily   . clopidogrel  75 mg Oral Daily  . enoxaparin (LOVENOX) injection  40 mg Subcutaneous Q12H  . feeding supplement (ENSURE ENLIVE)  237 mL Oral TID BM  . insulin aspart  0-20 Units Subcutaneous TID WC  . insulin aspart  0-5 Units Subcutaneous QHS  . mouth rinse  15 mL Mouth Rinse BID  . methylPREDNISolone sodium succinate  60 mg Intravenous Q12H  . metoprolol succinate  25 mg Oral Daily  . pantoprazole  40 mg Oral Daily  . polyethylene glycol  17 g Oral BID  . sodium chloride flush  10-40 mL Intracatheter Q12H  . sodium chloride flush  3 mL Intravenous Q12H  . pamrevlumab or placebo  35 mg/kg Intravenous Q7 days   Followed by  . [START ON 03/14/2019] pamrevlumab or placebo  35 mg/kg Intravenous Once  . ascorbic acid  500 mg Oral Daily  . zinc sulfate  220 mg Oral Daily   Continuous: . sodium chloride     ESP:QZRAQT chloride, acetaminophen **OR** acetaminophen, ALPRAZolam, guaiFENesin-codeine, hydrALAZINE, Ipratropium-Albuterol, lip balm, nitroGLYCERIN, ondansetron **OR** ondansetron (ZOFRAN) IV, polyethylene glycol, sodium chloride, sodium chloride flush, sodium chloride flush, traZODone   Objective:  Vital Signs  Vitals:   02/21/19 0600 02/21/19 0700 02/21/19 0748 02/21/19 0800  BP: 136/90 133/76 133/76 (!) 146/76  Pulse: 85 (!) 49 75 (!) 53  Resp:   (!) 24   Temp:      TempSrc:      SpO2: (!) 84% (!) 86% 91% 91%  Weight:      Height:        Intake/Output Summary (Last 24 hours) at 02/21/2019 0840 Last data filed at 02/21/2019 0252 Gross per 24 hour  Intake 730 ml  Output 1400 ml  Net -670 ml   Filed Weights   02/03/2019 2152  Weight: 105.7 kg    General appearance: Awake alert.  In no distress Resp: Noted to be tachypneic at rest.  No use of accessory muscles.  Coarse breath sounds bilaterally.  No wheezing or rhonchi heard today.   Cardio: S1-S2 is normal regular.  No S3-S4.  No rubs murmurs or bruit GI: Abdomen is soft.  Nontender nondistended.  Bowel sounds are  present normal.  No masses organomegaly Extremities: No edema.  Full range of motion of lower extremities. Neurologic: Alert and oriented x3.  No focal neurological deficits.    Lab Results:  Data Reviewed: I have personally reviewed following labs and imaging studies  CBC: Recent Labs  Lab 02/17/19 0500 02/18/19 0500 02/19/19 0500 02/20/19 0500 02/21/19 0611  WBC 12.5* 14.5* 16.7* 17.5* 18.4*  NEUTROABS  --   --   --   --  16.1*  HGB 14.3 15.0 15.5 16.3 15.8  HCT 43.2 44.8 47.8 50.4 48.9  MCV 90.9 90.7 91.0 91.1 91.9  PLT 228 226 246 233 622    Basic Metabolic Panel: Recent Labs  Lab 02/15/19 0530 02/16/19 0500 02/17/19 0500 02/18/19 0500 02/19/19 0500 02/20/19 0500 02/21/19 0611  NA 140 140 141 141 140 141 136  K 4.2 4.4 4.8 4.8 4.9 4.8 5.1  CL 105 102 106 107 106 104 100  CO2 23 25 27 25 25 27 28   GLUCOSE 155* 151* 155* 144* 167* 142* 144*  BUN 30* 36* 34* 35* 35* 36* 34*  CREATININE 1.07 1.20 0.99 1.08 1.06 1.08 1.06  CALCIUM 8.6* 8.6* 8.2* 8.1* 8.3* 8.5* 8.2*  MG 1.9 2.2 2.4 2.2  --   --   --   PHOS 3.3 2.5 3.5 3.9  --   --   --     GFR: Estimated Creatinine Clearance: 68.8 mL/min (by C-G formula based on SCr of 1.06 mg/dL).  Liver Function Tests: Recent Labs  Lab 02/17/19 0500 02/18/19 0500 02/19/19 0500 02/20/19 0500 02/21/19 0611  AST 39 42* 34 36 32  ALT 27 35 43 50* 47*  ALKPHOS 60 67 67 68 61  BILITOT 0.6 0.7 0.8 1.3* 1.2  PROT 5.1* 5.1* 5.4* 5.7* 5.6*  ALBUMIN 2.6* 2.6* 2.9* 3.1* 3.0*     CBG: Recent Labs  Lab 02/20/19 0739 02/20/19 1123 02/20/19 1552 02/20/19 2102 02/21/19 0803  GLUCAP 132* 262* 121* 164* 154*    Anemia Panel: Recent Labs    02/19/19 0500 02/20/19 0500  FERRITIN 124 119    Recent Results (from the past 240 hour(s))  SARS Coronavirus 2 [facility] order, Performed in [place] hospital lab)     Status: Abnormal   Collection Time: 01/31/2019  2:00 AM   Specimen: Nasopharyngeal Swab  Result Value Ref  Range Status   SARS Coronavirus 2 POSITIVE (A) NEGATIVE Final    Comment: CRITICAL RESULT CALLED TO, READ BACK BY AND VERIFIED WITH: AMY SMITH @333  02/04/2019 TTG  (NOTE) If result is NEGATIVE SARS-CoV-2 target nucleic acids are NOT DETECTED. The SARS-CoV-2 RNA is generally detectable in upper and lower  respiratory specimens during the acute phase of infection. The lowest  concentration of SARS-CoV-2 viral copies this assay can detect is 250  copies / mL. A negative result does not preclude SARS-CoV-2 infection  and should not be used as the sole basis for treatment or other  patient management decisions.  A negative result may occur with  improper specimen collection / handling, submission of specimen other  than nasopharyngeal swab, presence of viral mutation(s) within the  areas targeted by this assay, and inadequate number of viral copies  (<250 copies / mL). A negative result must be combined with clinical  observations, patient history, and epidemiological information. If result is POSITIVE SARS-CoV-2 target nucleic acids are DETECTED.  The SARS-CoV-2 RNA is generally detectable in upper and lower  respiratory specimens during the acute phase of infection.  Positive  results are indicative of active infection with SARS-CoV-2.  Clinical  correlation with patient history and other diagnostic information is  necessary to determine patient infection status.  Positive results do  not rule out bacterial infection or co-infection with other viruses. If result is PRESUMPTIVE POSTIVE SARS-CoV-2 nucleic acids MAY BE PRESENT.   A presumptive positive result was obtained on the submitted specimen  and confirmed on repeat testing.  While 2019 novel coronavirus  (SARS-CoV-2) nucleic acids may be present in the submitted sample  additional confirmatory testing may be necessary for epidemiological  and / or clinical management purposes  to differentiate between  SARS-CoV-2 and other  Sarbecovirus currently known to infect humans.  If clinically indicated additional testing with an alternate test  methodology [phone]  is advised. The SARS-CoV-2 RNA is generally  detectable in  upper and lower respiratory specimens during the acute  phase of infection. The expected result is Negative. Fact Sheet for Patients:  StrictlyIdeas.no Fact Sheet for Healthcare Providers: BankingDealers.co.za This test is not yet approved or cleared by the [place] FDA and has been authorized for detection and/or diagnosis of SARS-CoV-2 by FDA under an Emergency Use Authorization (EUA).  This EUA will remain in effect (meaning this test can be used) for the duration of the COVID-19 declaration under Section 564(b)(1) of the Act, 21 U.S.C. section 360bbb-3(b)(1), unless the authorization is terminated or revoked sooner. Performed at [facility], [address] [place], [place] [address]   Blood Culture (routine x 2)     Status: None   Collection Time: 02/12/2019  2:01 AM   Specimen: BLOOD  Result Value Ref Range Status   Specimen Description   Final    BLOOD LEFT ANTECUBITAL Performed at [facility], [place] [address], [place], [place] [address]    Special Requests   Final    BOTTLES DRAWN AEROBIC AND ANAEROBIC Blood Culture results may not be optimal due to an excessive volume of blood received in culture bottles   Culture   Final    NO GROWTH 5 DAYS Performed at [facility], [address] [place], [place] [address]    Report Status 02/18/2019 FINAL  Final  Blood Culture (routine x 2)     Status: Abnormal   Collection Time: 02/20/2019  3:44 AM   Specimen: BLOOD  Result Value Ref Range Status   Specimen Description   Final    BLOOD RIGHT ANTECUBITAL Performed at [facility], [place] [address], [place], [place] [address]    Special Requests   Final    BOTTLES DRAWN AEROBIC AND ANAEROBIC Blood Culture  adequate volume Performed at [facility], [address] [place], [place] [address]    Culture  Setup Time   Final    GRAM POSITIVE COCCI AEROBIC BOTTLE ONLY CRITICAL RESULT CALLED TO, READ BACK BY AND VERIFIED WITH: [name] CHAPPELL ON 02/14/2019 AT 11:26 [place].    Culture (A)  Final    STAPHYLOCOCCUS SPECIES (COAGULASE NEGATIVE) THE SIGNIFICANCE OF ISOLATING THIS ORGANISM FROM A SINGLE SET OF BLOOD CULTURES WHEN MULTIPLE SETS ARE DRAWN IS UNCERTAIN. PLEASE NOTIFY THE MICROBIOLOGY DEPARTMENT WITHIN ONE WEEK IF SPECIATION AND SENSITIVITIES ARE REQUIRED. Performed at [facility], [place] [address], [place], [place] [address]    Report Status 02/16/2019 FINAL  Final  Blood Culture ID Panel (Reflexed)     Status: Abnormal   Collection Time: 02/01/2019  3:44 AM  Result Value Ref Range Status   Enterococcus species NOT DETECTED NOT DETECTED Final   Listeria monocytogenes NOT DETECTED NOT DETECTED Final   Staphylococcus species DETECTED (A) NOT DETECTED Final    Comment: Methicillin (oxacillin) resistant coagulase negative staphylococcus. Possible blood culture contaminant (unless isolated from more than one blood culture draw or clinical case suggests pathogenicity). No antibiotic treatment is indicated for blood  culture contaminants. CRITICAL RESULT CALLED TO, READ BACK BY AND VERIFIED WITH:  [name] CHAPPELL ON 02/14/2019 AT 11:26. TIK    Staphylococcus aureus (BCID) NOT DETECTED NOT DETECTED Final   Methicillin resistance DETECTED (A) NOT DETECTED Final    Comment: CRITICAL RESULT CALLED TO, READ BACK BY AND VERIFIED WITH: [name] CHAPPELL ON 02/14/2019 AT 11:26. TIK    Streptococcus species NOT DETECTED NOT DETECTED Final   Streptococcus agalactiae NOT DETECTED NOT DETECTED Final  Streptococcus pneumoniae NOT DETECTED NOT DETECTED Final   Streptococcus pyogenes NOT DETECTED NOT DETECTED Final   Acinetobacter baumannii NOT DETECTED NOT DETECTED Final   Enterobacteriaceae  species NOT DETECTED NOT DETECTED Final   Enterobacter cloacae complex NOT DETECTED NOT DETECTED Final   Escherichia coli NOT DETECTED NOT DETECTED Final   Klebsiella oxytoca NOT DETECTED NOT DETECTED Final   Klebsiella pneumoniae NOT DETECTED NOT DETECTED Final   Proteus species NOT DETECTED NOT DETECTED Final   Serratia marcescens NOT DETECTED NOT DETECTED Final   Haemophilus influenzae NOT DETECTED NOT DETECTED Final   Neisseria meningitidis NOT DETECTED NOT DETECTED Final   Pseudomonas aeruginosa NOT DETECTED NOT DETECTED Final   Candida albicans NOT DETECTED NOT DETECTED Final   Candida glabrata NOT DETECTED NOT DETECTED Final   Candida krusei NOT DETECTED NOT DETECTED Final   Candida parapsilosis NOT DETECTED NOT DETECTED Final   Candida tropicalis NOT DETECTED NOT DETECTED Final    Comment: Performed at [facility], [address] [place], [place] [address]  MRSA PCR Screening     Status: None   Collection Time: 02/01/2019  4:51 AM   Specimen: Nasopharyngeal  Result Value Ref Range Status   MRSA by PCR NEGATIVE NEGATIVE Final    Comment:        The GeneXpert MRSA Assay (FDA approved for NASAL specimens only), is one component of a comprehensive MRSA colonization surveillance program. It is not intended to diagnose MRSA infection nor to guide or monitor treatment for MRSA infections. Performed at [facility], [address] [place], [place] [address]   Culture, blood (routine x 2)     Status: Abnormal   Collection Time: 02/14/19  6:50 PM   Specimen: BLOOD RIGHT HAND  Result Value Ref Range Status   Specimen Description   Final    BLOOD RIGHT HAND Performed at [place] [address] [place], [place] [address]    Special Requests   Final    BOTTLES DRAWN AEROBIC ONLY Blood Culture adequate volume Performed at [place] [address], [place], [place] [address]    Culture  Setup Time   Final     AEROBIC BOTTLE ONLY GRAM POSITIVE COCCI CRITICAL VALUE NOTED.  VALUE IS CONSISTENT WITH PREVIOUSLY REPORTED AND CALLED VALUE.    Culture (A)  Final    STAPHYLOCOCCUS SPECIES (COAGULASE NEGATIVE) THE SIGNIFICANCE OF ISOLATING THIS ORGANISM FROM A SINGLE SET OF BLOOD CULTURES WHEN MULTIPLE SETS ARE DRAWN IS UNCERTAIN. PLEASE NOTIFY THE MICROBIOLOGY DEPARTMENT WITHIN ONE WEEK IF SPECIATION AND SENSITIVITIES ARE REQUIRED. Performed at [facility], [place] [address], [place], [place] [address]    Report Status 02/17/2019 FINAL  Final  Culture, blood (routine x 2)     Status: None   Collection Time: 02/14/19  6:55 PM   Specimen: BLOOD RIGHT FOREARM  Result Value Ref Range Status   Specimen Description   Final    BLOOD RIGHT FOREARM Performed at [facility], [place] [address], [place], [place] [address]    Special Requests   Final    BOTTLES DRAWN AEROBIC ONLY Blood Culture results may not be optimal due to an excessive volume of blood received in culture bottles Performed at [place] [address] [place], [place] [address]    Culture   Final    NO GROWTH 5 DAYS Performed at [facility], [place] [address], [place],  [address]  Report Status 02/19/2019 FINAL  Final      Radiology Studies: No results found.     LOS: 8 days   Matai Carpenito [employer] on [email or web address]  02/21/2019, 8:40 AM

## 2019-02-21 NOTE — Progress Notes (Signed)
NAME:  Clayton Sawyer, MRN:  [id number], DOB:  1941/03/22, LOS: 8 ADMISSION DATE:  02/25/2019, CONSULTATION DATE:  February 13 2019 REFERRING MD:  Dr. Marcille Blanco, CHIEF COMPLAINT:  Cough   Brief History   78 year old male admitted on February 13, 2019 in the setting of severe acute respiratory failure with hypoxemia from SARS-CoV-2.   Past Medical History  Coronary artery disease Diastolic heart failure Hypertension CVA in past Hyperlipidemia History of prostate cancer Obstructive sleep apnea  Significant Hospital Events   7/15 Admit, transfer to [facility] 7/16 enrolled in fibrinogen research drug   Consults:  Pulmonary and critical care medicine  Procedures:    Significant Diagnostic Tests:    Micro Data:  7/10, 7/15 SARS-CoV-2 positive 7/16 blood > coag neg staph in 1/4  Antimicrobials:  Ceftriaxone July 14 Azithromycin July 15 Remdesivir July 15 > July 19 Actemra July 15, July 19 Vancomycin July 18, July 19 Solumedrol July 14 Pamrevlumab vs placebo, next scheduled infusion July 29  Interim history/subjective:  Feels better Oxygen needs improving   Objective   Blood pressure (!) 144/70, pulse 96, temperature 98 F (36.7 C), temperature source Axillary, resp. rate (!) 24, height 5\' 9"  (1.753 m), weight 105.7 kg, SpO2 91 %.    FiO2 (%):  [100 %] 100 %   Intake/Output Summary (Last 24 hours) at 02/21/2019 1428 Last data filed at 02/21/2019 1400 Gross per 24 hour  Intake 20 ml  Output 1625 ml  Net -1605 ml   Filed Weights   02/27/2019 2152  Weight: 105.7 kg    Examination:  General:  Resting comfortably in chair HENT: NCAT OP clear PULM: Crackles bases B, normal effort CV: RRR, no mgr GI: BS+, soft, nontender MSK: normal bulk and tone Neuro: awake, alert, no distress, MAEW  July 20 chest x-ray images independently reviewed showing low lung volumes, infiltrate left greater than right, dual-chamber pacemaker  Resolved Hospital Problem list     Assessment &  Plan:  Severe acute respiratory failure with hypoxemia in setting of COVID-19 pneumonia: Oxygenation improving Continue high flow nasal cannula Tolerate periods of hypoxemia, goal at rest is greater than 85% SaO2, with movement ideally above 75% Decision for intubation should be based on a change in mental status or physical evidence of ventilatory failure such as nasal flaring, accessory muscle use, paradoxical breathing Out of bed to chair as able Prone positioning while in bed Continue low-dose Solu-Medrol Continue study protocol per Pamrevlumab study  Out of ICU today, PCCM available PRN  Best practice:  Diet: regular diet Pain/Anxiety/Delirium protocol (if indicated): n/a VAP protocol (if indicated): na DVT prophylaxis: lovenox GI prophylaxis: pantoprazole Glucose control: SSI Mobility: bed rest Code Status: full Family Communication:  Per TRH Disposition: move to PCU today  Labs   CBC: Recent Labs  Lab 02/17/19 0500 02/18/19 0500 02/19/19 0500 02/20/19 0500 02/21/19 0611  WBC 12.5* 14.5* 16.7* 17.5* 18.4*  NEUTROABS  --   --   --   --  16.1*  HGB 14.3 15.0 15.5 16.3 15.8  HCT 43.2 44.8 47.8 50.4 48.9  MCV 90.9 90.7 91.0 91.1 91.9  PLT 228 226 246 233 175    Basic Metabolic Panel: Recent Labs  Lab 02/15/19 0530 02/16/19 0500 02/17/19 0500 02/18/19 0500 02/19/19 0500 02/20/19 0500 02/21/19 0611  NA 140 140 141 141 140 141 136  K 4.2 4.4 4.8 4.8 4.9 4.8 5.1  CL 105 102 106 107 106 104 100  CO2 23 25 27 25  25  27 28  GLUCOSE 155* 151* 155* 144* 167* 142* 144*  BUN 30* 36* 34* 35* 35* 36* 34*  CREATININE 1.07 1.20 0.99 1.08 1.06 1.08 1.06  CALCIUM 8.6* 8.6* 8.2* 8.1* 8.3* 8.5* 8.2*  MG 1.9 2.2 2.4 2.2  --   --   --   PHOS 3.3 2.5 3.5 3.9  --   --   --    GFR: Estimated Creatinine Clearance: 68.8 mL/min (by C-G formula based on SCr of 1.06 mg/dL). Recent Labs  Lab 02/15/19 0530  02/18/19 0500 02/19/19 0500 02/20/19 0500 02/21/19 0611  WBC 13.2*    < > 14.5* 16.7* 17.5* 18.4*  LATICACIDVEN 1.9  --   --   --   --   --    < > = values in this interval not displayed.    Liver Function Tests: Recent Labs  Lab 02/17/19 0500 02/18/19 0500 02/19/19 0500 02/20/19 0500 02/21/19 0611  AST 39 42* 34 36 32  ALT 27 35 43 50* 47*  ALKPHOS 60 67 67 68 61  BILITOT 0.6 0.7 0.8 1.3* 1.2  PROT 5.1* 5.1* 5.4* 5.7* 5.6*  ALBUMIN 2.6* 2.6* 2.9* 3.1* 3.0*   No results for input(s): LIPASE, AMYLASE in the last 168 hours. No results for input(s): AMMONIA in the last 168 hours.  ABG No results found for: PHART, PCO2ART, PO2ART, HCO3, TCO2, ACIDBASEDEF, O2SAT   Coagulation Profile: No results for input(s): INR, PROTIME in the last 168 hours.  Cardiac Enzymes: No results for input(s): CKTOTAL, CKMB, CKMBINDEX, TROPONINI in the last 168 hours.  HbA1C: Hemoglobin A1C  Date/Time Value Ref Range Status  09/22/2012 02:49 AM 5.9 4.2 - 6.3 % Final    Comment:    The American Diabetes Association recommends that a primary goal of therapy should be <7% and that physicians should reevaluate the treatment regimen in patients with HbA1c values consistently >8%.     CBG: Recent Labs  Lab 02/20/19 1123 02/20/19 1552 02/20/19 2102 02/21/19 0803 02/21/19 1244  GLUCAP 262* 121* 164* 154* 201*     Critical care time: n/a     Roselie Awkward, MD [place] [facility] Pager: [phone] Cell: [phone] If no response, call [phone]

## 2019-02-21 NOTE — Progress Notes (Signed)
Patient having persistent hypoxia (79 to 82 percent) on max high flow nasal cannula.  Patient currently eating which probably is not helping.  Patient showing no signs of increased work of breathing.  Dr. [place] Pink notified, with suggestion to prone patient once he is done eating.  Patient told the plan and is adamantly refusing prone position.

## 2019-02-22 LAB — COMPREHENSIVE METABOLIC PANEL
ALT: 49 U/L — ABNORMAL HIGH (ref 0–44)
AST: 36 U/L (ref 15–41)
Albumin: 2.9 g/dL — ABNORMAL LOW (ref 3.5–5.0)
Alkaline Phosphatase: 55 U/L (ref 38–126)
Anion gap: 10 (ref 5–15)
BUN: 36 mg/dL — ABNORMAL HIGH (ref 8–23)
CO2: 28 mmol/L (ref 22–32)
Calcium: 8.1 mg/dL — ABNORMAL LOW (ref 8.9–10.3)
Chloride: 99 mmol/L (ref 98–111)
Creatinine, Ser: 1.17 mg/dL (ref 0.61–1.24)
GFR calc Af Amer: >60 mL/min (ref >60)
GFR calc non Af Amer: 59 mL/min — ABNORMAL LOW (ref >60)
Glucose, Bld: 104 mg/dL — ABNORMAL HIGH (ref 70–99)
Potassium: 4.3 mmol/L (ref 3.5–5.1)
Sodium: 137 mmol/L (ref 135–145)
Total Bilirubin: 1.4 mg/dL — ABNORMAL HIGH (ref 0.3–1.2)
Total Protein: 5.2 g/dL — ABNORMAL LOW (ref 6.5–8.1)

## 2019-02-22 LAB — GLUCOSE, CAPILLARY
Glucose-Capillary: 112 mg/dL — ABNORMAL HIGH (ref 70–99)
Glucose-Capillary: 184 mg/dL — ABNORMAL HIGH (ref 70–99)
Glucose-Capillary: 138 mg/dL — ABNORMAL HIGH (ref 70–99)
Glucose-Capillary: 133 mg/dL — ABNORMAL HIGH (ref 70–99)

## 2019-02-22 LAB — D-DIMER, QUANTITATIVE: D-Dimer, Quant: 3.2 ug/mL-FEU — ABNORMAL HIGH (ref 0.00–0.50)

## 2019-02-22 MED ORDER — ALPRAZOLAM 0.5 MG PO TABS
0.5000 mg | ORAL_TABLET | Freq: Two times a day (BID) | ORAL | Status: DC
  Administered 2019-02-22 – 2019-02-25 (×6): 0.5 mg via ORAL
  Filled 2019-02-22 (×6): qty 1

## 2019-02-22 NOTE — Progress Notes (Signed)
PROGRESS NOTE  Clayton Sawyer UJW:[id number] DOB: Dec 29, 1940 DOA: 02/04/2019 PCP: Jodi Marble, MD   LOS: 9 days   Brief Narrative / Interim history: 78 year old male with hypertension, prostate cancer, prior CVA who was admitted to the hospital 02/11/2019 from [facility] with profound hypoxic respiratory failure in the setting of COVID-19, and admitted to the ICU requiring heated high flow nasal cannula.  Patient was transferred to the floor on 7/23 as he wished to get out of the ICU and met criteria for the floor requiring 15 L high flow nasal cannula  Subjective: Comfortable however upon minimal movement in bed became quite short of breath and tachypneic.  He denies any chest pain, denies any abdominal pain, nausea or vomiting.  He has a good appetite.  Assessment & Plan: Principal Problem:   Acute respiratory disease due to COVID-19 virus Active Problems:   Presence of cardiac pacemaker   History of stroke   Essential (primary) hypertension   Atherosclerotic heart disease of native coronary artery without angina pectoris/PCI stents 09/2004, 03/2009 and 10/2011   COVID-19 virus infection   Dyspnea and respiratory abnormalities  Principal Problem Acute Hypoxic Respiratory Failure due to Covid-19 Viral Illness -Patient was admitted to the ICU initially requiring heated high flow nasal cannula, was able to be weaned down to 15 L high flow and then transferred to the floor on 7/20 3 PM. -Respiratory status remains quite tenuous this morning, he is maintaining upper 80s on 15 L high flow but on minimal movement in the bed he desats into the lower 70s, become short of breath, tachypneic and takes team few good minutes to recover -Patient has finished a course of Remdesivir, is currently on a steroid taper, received Actemra x2 doses -Continue prone positioning as able -Inflammatory markers improving  FiO2 (%):  [100 %] 100 %   COVID-19 Labs  Recent Labs    02/20/19 0500 02/21/19 0611  02/22/19 0500  DDIMER 6.16* 3.46* 3.20*  FERRITIN 119  --   --     Lab Results  Component Value Date   SARSCOV2NAA POSITIVE (A) 02/02/2019   SARSCOV2NAA Detected (A) 02/08/2019   Of note, patient was enrolled into fibrogen study by Dr. Lynford Citizen  Active Problems History of CVA -Continue Plavix, intolerant to statins  Essential hypertension -Blood pressure controlled, continue metoprolol  Hyperglycemia due to steroids -On sliding scale, CBGs stable  Bacteremia -1/4 bottles with coag negative staph: Another bottle with staph hominis, considered contaminants.  He was briefly on vancomycin which is now been discontinued  History of CAD with prior PCI -Continue beta-blocker, Plavix, he denies any chest pains  History of prostate cancer -Outpatient follow-up, currently under surveillance  Dual-lead pacemaker -Followed at [facility] for chronotropic incompetence   Scheduled Meds: . Chlorhexidine Gluconate Cloth  6 each Topical Daily  . clopidogrel  75 mg Oral Daily  . enoxaparin (LOVENOX) injection  40 mg Subcutaneous Q12H  . feeding supplement (ENSURE ENLIVE)  237 mL Oral TID BM  . insulin aspart  0-20 Units Subcutaneous TID WC  . insulin aspart  0-5 Units Subcutaneous QHS  . mouth rinse  15 mL Mouth Rinse BID  . methylPREDNISolone sodium succinate  60 mg Intravenous Q24H  . metoprolol succinate  25 mg Oral Daily  . pantoprazole  40 mg Oral Daily  . polyethylene glycol  17 g Oral BID  . sodium chloride flush  10-40 mL Intracatheter Q12H  . sodium chloride flush  3 mL Intravenous Q12H  . pamrevlumab  or placebo  35 mg/kg Intravenous Q7 days   Followed by  . [START ON 03/14/2019] pamrevlumab or placebo  35 mg/kg Intravenous Once  . ascorbic acid  500 mg Oral Daily  . zinc sulfate  220 mg Oral Daily   Continuous Infusions: . sodium chloride     PRN Meds:.sodium chloride, acetaminophen **OR** acetaminophen, ALPRAZolam, guaiFENesin-codeine, hydrALAZINE, Ipratropium-Albuterol,  lip balm, nitroGLYCERIN, ondansetron **OR** ondansetron (ZOFRAN) IV, polyethylene glycol, sodium chloride, sodium chloride flush, sodium chloride flush, traZODone  DVT prophylaxis: Lovenox Code Status: Full code Family Communication: d/w patient  Disposition Plan: TBD  Consultants:   PCCM  Procedures:   None   Antimicrobials:  None currently  Ceftriaxone July 14 Azithromycin July 15 Remdesivir July 15 > July 19 Actemra July 15, July 19 Vancomycin July 18, July 19 Solumedrol July 14 Pamrevlumab vs placebo, next scheduled infusion July 29  Objective: Vitals:   02/22/19 0435 02/22/19 0512 02/22/19 0756 02/22/19 0949  BP:  137/75 131/65 124/65  Pulse: 69  (!) 58 80  Resp: (!) 25  (!) 31   Temp:  97.6 F (36.4 C) 97.7 F (36.5 C)   TempSrc:  Oral Axillary   SpO2: (!) 89%  (!) 84%   Weight:      Height:        Intake/Output Summary (Last 24 hours) at 02/22/2019 1024 Last data filed at 02/22/2019 0950 Gross per 24 hour  Intake 10 ml  Output 2300 ml  Net -2290 ml   Filed Weights   02/24/2019 2152  Weight: 105.7 kg    Examination:  Constitutional: Tachypneic Eyes: PERRL, lids and conjunctivae normal ENMT: Mucous membranes are moist. No oropharyngeal exudates Neck: normal, supple Respiratory: Diffuse bilateral rhonchi, coarse breath sounds, no wheezing, tachypneic, shallow respirations Cardiovascular: Regular rate and rhythm, no murmurs / rubs / gallops. No LE edema.  Abdomen: no tenderness. Bowel sounds positive.  Musculoskeletal: no clubbing / cyanosis. No joint deformity upper and lower extremities. Skin: no rashes Neurologic: CN 2-12 grossly intact. Strength 5/5 in all 4.  Psychiatric: Normal judgment and insight. Alert and oriented x 3.    Data Reviewed: I have independently reviewed following labs and imaging studies  CXR - bilateral basilar infiltrates  CBC: Recent Labs  Lab 02/17/19 0500 02/18/19 0500 02/19/19 0500 02/20/19 0500 02/21/19 0611   WBC 12.5* 14.5* 16.7* 17.5* 18.4*  NEUTROABS  --   --   --   --  16.1*  HGB 14.3 15.0 15.5 16.3 15.8  HCT 43.2 44.8 47.8 50.4 48.9  MCV 90.9 90.7 91.0 91.1 91.9  PLT 228 226 246 233 680   Basic Metabolic Panel: Recent Labs  Lab 02/16/19 0500 02/17/19 0500 02/18/19 0500 02/19/19 0500 02/20/19 0500 02/21/19 0611 02/22/19 0500  NA 140 141 141 140 141 136 137  K 4.4 4.8 4.8 4.9 4.8 5.1 4.3  CL 102 106 107 106 104 100 99  CO2 _0 GLUCOSE 151* 155* 144* 167* 142* 144* 104*  BUN 36* 34* 35* 35* 36* 34* 36*  CREATININE 1.20 0.99 1.08 1.06 1.08 1.06 1.17  CALCIUM 8.6* 8.2* 8.1* 8.3* 8.5* 8.2* 8.1*  MG 2.2 2.4 2.2  --   --   --   --   PHOS 2.5 3.5 3.9  --   --   --   --    GFR: Estimated Creatinine Clearance: 62.3 mL/min (by C-G formula based on SCr of 1.17 mg/dL). Liver Function Tests: Recent Labs  Lab 02/18/19 0500 02/19/19 0500 02/20/19 0500 02/21/19 0611 02/22/19 0500  AST 42* 34 36 32 36  ALT 35 43 50* 47* 49*  ALKPHOS 67 67 68 61 55  BILITOT 0.7 0.8 1.3* 1.2 1.4*  PROT 5.1* 5.4* 5.7* 5.6* 5.2*  ALBUMIN 2.6* 2.9* 3.1* 3.0* 2.9*   No results for input(s): LIPASE, AMYLASE in the last 168 hours. No results for input(s): AMMONIA in the last 168 hours. Coagulation Profile: No results for input(s): INR, PROTIME in the last 168 hours. Cardiac Enzymes: No results for input(s): CKTOTAL, CKMB, CKMBINDEX, TROPONINI in the last 168 hours. BNP (last 3 results) No results for input(s): PROBNP in the last 8760 hours. HbA1C: No results for input(s): HGBA1C in the last 72 hours. CBG: Recent Labs  Lab 02/21/19 0803 02/21/19 1244 02/21/19 1631 02/21/19 2022 02/22/19 0758  GLUCAP 154* 201* 108* 167* 112*   Lipid Profile: No results for input(s): CHOL, HDL, LDLCALC, TRIG, CHOLHDL, LDLDIRECT in the last 72 hours. Thyroid Function Tests: No results for input(s): TSH, T4TOTAL, FREET4, T3FREE, THYROIDAB in the last 72 hours. Anemia Panel: Recent Labs     02/20/19 0500  FERRITIN 119   Urine analysis:    Component Value Date/Time   COLORURINE YELLOW (A) 12/07/2017 0008   APPEARANCEUR CLEAR (A) 12/07/2017 0008   APPEARANCEUR Clear 04/18/2013 2216   LABSPEC 1.021 12/07/2017 0008   LABSPEC 1.025 04/18/2013 2216   PHURINE 5.0 12/07/2017 0008   GLUCOSEU NEGATIVE 12/07/2017 0008   GLUCOSEU Negative 04/18/2013 2216   HGBUR NEGATIVE 12/07/2017 0008   BILIRUBINUR NEGATIVE 12/07/2017 0008   BILIRUBINUR Negative 04/18/2013 2216   KETONESUR NEGATIVE 12/07/2017 0008   PROTEINUR NEGATIVE 12/07/2017 0008   NITRITE NEGATIVE 12/07/2017 0008   LEUKOCYTESUR NEGATIVE 12/07/2017 0008   LEUKOCYTESUR Negative 04/18/2013 2216   Sepsis Labs: Invalid input(s): PROCALCITONIN, LACTICIDVEN  Recent Results (from the past 240 hour(s))  SARS Coronavirus 2 [facility] order, Performed in [place] hospital lab)     Status: Abnormal   Collection Time: 03/01/2019  2:00 AM   Specimen: Nasopharyngeal Swab  Result Value Ref Range Status   SARS Coronavirus 2 POSITIVE (A) NEGATIVE Final    Comment: CRITICAL RESULT CALLED TO, READ BACK BY AND VERIFIED WITH: AMY SMITH _0  02/03/2019 TTG  (NOTE) If result is NEGATIVE SARS-CoV-2 target nucleic acids are NOT DETECTED. The SARS-CoV-2 RNA is generally detectable in upper and lower  respiratory specimens during the acute phase of infection. The lowest  concentration of SARS-CoV-2 viral copies this assay can detect is 250  copies / mL. A negative result does not preclude SARS-CoV-2 infection  and should not be used as the sole basis for treatment or other  patient management decisions.  A negative result may occur with  improper specimen collection / handling, submission of specimen other  than nasopharyngeal swab, presence of viral mutation(s) within the  areas targeted by this assay, and inadequate number of viral copies  (<250 copies / mL). A negative result must be combined with clinical  observations, patient  history, and epidemiological information. If result is POSITIVE SARS-CoV-2 target nucleic acids are DETECTED.  The SARS-CoV-2 RNA is generally detectable in upper and lower  respiratory specimens during the acute phase of infection.  Positive  results are indicative of active infection with SARS-CoV-2.  Clinical  correlation with patient history and other diagnostic information is  necessary to determine patient infection status.  Positive results do  not rule out bacterial infection or co-infection with other viruses.  If result is PRESUMPTIVE POSTIVE SARS-CoV-2 nucleic acids MAY BE PRESENT.   A presumptive positive result was obtained on the submitted specimen  and confirmed on repeat testing.  While 2019 novel coronavirus  (SARS-CoV-2) nucleic acids may be present in the submitted sample  additional confirmatory testing may be necessary for epidemiological  and / or clinical management purposes  to differentiate between  SARS-CoV-2 and other Sarbecovirus currently known to infect humans.  If clinically indicated additional testing with an alternate test  methodology [phone])  is advised. The SARS-CoV-2 RNA is generally  detectable in upper and lower respiratory specimens during the acute  phase of infection. The expected result is Negative. Fact Sheet for Patients:  StrictlyIdeas.no Fact Sheet for Healthcare Providers: BankingDealers.co.za This test is not yet approved or cleared by the [place] FDA and has been authorized for detection and/or diagnosis of SARS-CoV-2 by FDA under an Emergency Use Authorization (EUA).  This EUA will remain in effect (meaning this test can be used) for the duration of the COVID-19 declaration under Section 564(b)(1) of the Act, 21 U.S.C. section 360bbb-3(b)(1), unless the authorization is terminated or revoked sooner. Performed at [facility], [address] [place], [place] [address]    Blood Culture (routine x 2)     Status: None   Collection Time: 02/20/2019  2:01 AM   Specimen: BLOOD  Result Value Ref Range Status   Specimen Description   Final    BLOOD LEFT ANTECUBITAL Performed at [facility], [place] [address] [place], [place] [address]    Special Requests   Final    BOTTLES DRAWN AEROBIC AND ANAEROBIC Blood Culture results may not be optimal due to an excessive volume of blood received in culture bottles   Culture   Final    NO GROWTH 5 DAYS Performed at [facility], [address] [place], [place] [address]    Report Status 02/18/2019 FINAL  Final  Blood Culture (routine x 2)     Status: Abnormal   Collection Time: 02/20/2019  3:44 AM   Specimen: BLOOD  Result Value Ref Range Status   Specimen Description   Final    BLOOD RIGHT ANTECUBITAL Performed at [facility], [place] [address], [place], [place] [address]    Special Requests   Final    BOTTLES DRAWN AEROBIC AND ANAEROBIC Blood Culture adequate volume Performed at [facility], [address] [place], [place] [address]    Culture  Setup Time   Final    GRAM POSITIVE COCCI AEROBIC BOTTLE ONLY CRITICAL RESULT CALLED TO, READ BACK BY AND VERIFIED WITH: [name] CHAPPELL ON 02/14/2019 AT 11:26 [place].    Culture (A)  Final    STAPHYLOCOCCUS SPECIES (COAGULASE NEGATIVE) THE SIGNIFICANCE OF ISOLATING THIS ORGANISM FROM A SINGLE SET OF BLOOD CULTURES WHEN MULTIPLE SETS ARE DRAWN IS UNCERTAIN. PLEASE NOTIFY THE MICROBIOLOGY DEPARTMENT WITHIN ONE WEEK IF SPECIATION AND SENSITIVITIES ARE REQUIRED. Performed at [facility], [place] [address], [place], [place] [address]    Report Status 02/16/2019 FINAL  Final  Blood Culture ID Panel (Reflexed)     Status: Abnormal   Collection Time: 02/19/2019  3:44 AM  Result Value Ref Range Status   Enterococcus species NOT DETECTED NOT DETECTED Final   Listeria monocytogenes NOT DETECTED NOT DETECTED Final   Staphylococcus species DETECTED  (A) NOT DETECTED Final    Comment: Methicillin (oxacillin) resistant coagulase negative staphylococcus. Possible blood culture contaminant (unless isolated from  more than one blood culture draw or clinical case suggests pathogenicity). No antibiotic treatment is indicated for blood  culture contaminants. CRITICAL RESULT CALLED TO, READ BACK BY AND VERIFIED WITH:  [name] CHAPPELL ON 02/14/2019 AT 11:26. TIK    Staphylococcus aureus (BCID) NOT DETECTED NOT DETECTED Final   Methicillin resistance DETECTED (A) NOT DETECTED Final    Comment: CRITICAL RESULT CALLED TO, READ BACK BY AND VERIFIED WITH: [name] CHAPPELL ON 02/14/2019 AT 11:26. TIK    Streptococcus species NOT DETECTED NOT DETECTED Final   Streptococcus agalactiae NOT DETECTED NOT DETECTED Final   Streptococcus pneumoniae NOT DETECTED NOT DETECTED Final   Streptococcus pyogenes NOT DETECTED NOT DETECTED Final   Acinetobacter baumannii NOT DETECTED NOT DETECTED Final   Enterobacteriaceae species NOT DETECTED NOT DETECTED Final   Enterobacter cloacae complex NOT DETECTED NOT DETECTED Final   Escherichia coli NOT DETECTED NOT DETECTED Final   Klebsiella oxytoca NOT DETECTED NOT DETECTED Final   Klebsiella pneumoniae NOT DETECTED NOT DETECTED Final   Proteus species NOT DETECTED NOT DETECTED Final   Serratia marcescens NOT DETECTED NOT DETECTED Final   Haemophilus influenzae NOT DETECTED NOT DETECTED Final   Neisseria meningitidis NOT DETECTED NOT DETECTED Final   Pseudomonas aeruginosa NOT DETECTED NOT DETECTED Final   Candida albicans NOT DETECTED NOT DETECTED Final   Candida glabrata NOT DETECTED NOT DETECTED Final   Candida krusei NOT DETECTED NOT DETECTED Final   Candida parapsilosis NOT DETECTED NOT DETECTED Final   Candida tropicalis NOT DETECTED NOT DETECTED Final    Comment: Performed at [facility], [address] [place], [place] [address]  MRSA PCR Screening     Status: None   Collection Time: 02/12/2019  4:51 AM    Specimen: Nasopharyngeal  Result Value Ref Range Status   MRSA by PCR NEGATIVE NEGATIVE Final    Comment:        The GeneXpert MRSA Assay (FDA approved for NASAL specimens only), is one component of a comprehensive MRSA colonization surveillance program. It is not intended to diagnose MRSA infection nor to guide or monitor treatment for MRSA infections. Performed at [facility], [address] [place], [place] [address]   Culture, blood (routine x 2)     Status: Abnormal   Collection Time: 02/14/19  6:50 PM   Specimen: BLOOD RIGHT HAND  Result Value Ref Range Status   Specimen Description   Final    BLOOD RIGHT HAND Performed at [place] [address], [place], [place] [address]    Special Requests   Final    BOTTLES DRAWN AEROBIC ONLY Blood Culture adequate volume Performed at [place] [address], [place], [place] [address]    Culture  Setup Time   Final    AEROBIC BOTTLE ONLY GRAM POSITIVE COCCI CRITICAL VALUE NOTED.  VALUE IS CONSISTENT WITH PREVIOUSLY REPORTED AND CALLED VALUE.    Culture (A)  Final    STAPHYLOCOCCUS SPECIES (COAGULASE NEGATIVE) THE SIGNIFICANCE OF ISOLATING THIS ORGANISM FROM A SINGLE SET OF BLOOD CULTURES WHEN MULTIPLE SETS ARE DRAWN IS UNCERTAIN. PLEASE NOTIFY THE MICROBIOLOGY DEPARTMENT WITHIN ONE WEEK IF SPECIATION AND SENSITIVITIES ARE REQUIRED. Performed at [facility], [place] [address] [place], [place] [address]    Report Status 02/17/2019 FINAL  Final  Culture, blood (routine x 2)     Status: None   Collection Time: 02/14/19  6:55 PM   Specimen: BLOOD RIGHT FOREARM  Result Value Ref Range Status  Specimen Description   Final    BLOOD RIGHT FOREARM Performed at [facility], [place] [address] [place], [place] [address]    Special Requests   Final    BOTTLES DRAWN AEROBIC ONLY Blood Culture results may not be optimal due to an excessive volume of blood received in  culture bottles Performed at [place] [address], [place], [place] [address]    Culture   Final    NO GROWTH 5 DAYS Performed at [facility], [place] [address], [place], [place] [address]    Report Status 02/19/2019 FINAL  Final      Radiology Studies: No results found.  Marzetta Board, MD, PhD [facility]  Contact via  [email or web address]  [place] P: [phone] F: [phone]

## 2019-02-22 NOTE — Evaluation (Signed)
Physical Therapy Evaluation Patient Details Name: Clayton Sawyer MRN: [id number] DOB: Jun 03, 1941 Today's Date: 02/22/2019   History of Present Illness  Pt is a 78 y.o. male admitted 02/28/2019 with severe acute respiratory failure with hypoxemia from SARS-CoV-2. PMH includes CAD, HTN, HF, CVA, OSA.    Clinical Impression  Pt presents with an overall decrease in functional mobility secondary to above. PTA, pt independent, drives and pastors a church; his wife was recently d/c home from [facility]. Today, pt moving well, but limited by cardiopulmonary status (see vitals below) requiring prolonged seated rest breaks to recover from minimal activity. Educ on IS use. Pt motivated to participate and return to PLOF. Pt would benefit from continued acute PT services to maximize functional mobility and independence prior to d/c with HHPT services.  SpO2 down to 77% on 15L O2 HFNC, returning to 93% with prolonged seated rest and pursed lip breathing RR up to 45 HR 79-96 BP 126/65     Follow Up Recommendations Home health PT;Supervision - Intermittent    Equipment Recommendations  None recommended by PT    Recommendations for Other Services       Precautions / Restrictions Precautions Precautions: Fall Precaution Comments: monitor O2 Restrictions Weight Bearing Restrictions: No      Mobility  Bed Mobility               General bed mobility comments: Received sitting in recliner  Transfers Overall transfer level: Needs assistance Equipment used: None Transfers: Sit to/from Stand Sit to Stand: Min guard         General transfer comment: 3x sit<>stand with min guard for balance, pt reliant on UE support to push into standing  Ambulation/Gait Ambulation/Gait assistance: Min guard Gait Distance (Feet): 24 Feet Assistive device: 1 person hand held assist Gait Pattern/deviations: Step-through pattern;Decreased stride length Gait velocity: Decreased   General Gait  Details: Amb forwards/backwards with HHA and min guard for balance; SpO2 down to 77% on 15L O2 HFNC requiring seated rest and pursed lip breathing, returning to 89-93%  Stairs            Wheelchair Mobility    Modified Rankin (Stroke Patients Only)       Balance Overall balance assessment: Needs assistance   Sitting balance-Leahy Scale: Good       Standing balance-Leahy Scale: Fair                               Pertinent Vitals/Pain Pain Assessment: No/denies pain    Home Living Family/patient expects to be discharged to:: Private residence Living Arrangements: Spouse/significant other Available Help at Discharge: Family;Available 24 hours/day Type of Home: House Home Access: Stairs to enter Entrance Stairs-Rails: Right Entrance Stairs-Number of Steps: 2-3 Home Layout: One level Home Equipment: Walker - 2 wheels Additional Comments: Wife recently returned home after admission to [facility]    Prior Function Level of Independence: Independent         Comments: Pastor at a church in [place]        Extremity/Trunk Assessment   Upper Extremity Assessment Upper Extremity Assessment: Overall WFL for tasks assessed    Lower Extremity Assessment Lower Extremity Assessment: Overall WFL for tasks assessed(5/5 throughout)       Communication   Communication: HOH  Cognition Arousal/Alertness: Awake/alert Behavior During Therapy: WFL for tasks assessed/performed Overall Cognitive Status: Within Functional Limits for tasks assessed  General Comments: WFL for simple tasks; did not formally assess      General Comments General comments (skin integrity, edema, etc.): Pt demonstrating incorrect technique for incentive spirometer, requiring education and practice with frequent cues, able to perform better but still doing so with quick breath in reaching 500-765mL    Exercises Other  Exercises Other Exercises: ankle pumps, LAQ, seated marching (pt reports familiar with exercises from participating in outpatient cardiac rehab)   Assessment/Plan    PT Assessment Patient needs continued PT services  PT Problem List Decreased activity tolerance;Decreased balance;Decreased mobility;Cardiopulmonary status limiting activity       PT Treatment Interventions DME instruction;Gait training;Stair training;Functional mobility training;Therapeutic activities;Therapeutic exercise;Balance training;Patient/family education    PT Goals (Current goals can be found in the Care Plan section)  Acute Rehab PT Goals Patient Stated Goal: Return home to wife PT Goal Formulation: With patient Time For Goal Achievement: 03/08/19 Potential to Achieve Goals: Good    Frequency Min 3X/week   Barriers to discharge        Co-evaluation               AM-PAC PT "6 Clicks" Mobility  Outcome Measure Help needed turning from your back to your side while in a flat bed without using bedrails?: A Little Help needed moving from lying on your back to sitting on the side of a flat bed without using bedrails?: A Little Help needed moving to and from a bed to a chair (including a wheelchair)?: A Little Help needed standing up from a chair using your arms (e.g., wheelchair or bedside chair)?: A Little Help needed to walk in hospital room?: A Little Help needed climbing 3-5 steps with a railing? : A Little 6 Click Score: 18    End of Session Equipment Utilized During Treatment: Oxygen Activity Tolerance: Patient tolerated treatment well;Treatment limited secondary to medical complications (Comment) Patient left: in chair;with call bell/phone within reach Nurse Communication: Mobility status PT Visit Diagnosis: Other abnormalities of gait and mobility (R26.89)    Time: 1102-1117 PT Time Calculation (min) (ACUTE ONLY): 26 min   Charges:   PT Evaluation $PT Eval Moderate Complexity: 1  Mod PT Treatments $Gait Training: 8-22 mins      Mabeline Caras, PT, DPT Acute Rehabilitation Services  Pager [phone] Office [place] 02/22/2019, 1:58 PM

## 2019-02-22 NOTE — Progress Notes (Signed)
Spoke to patient's granddaughter Clayton Sawyer, she was given a detailed update about his progress and status. Patient does have anxiety about shortness of breath and his oxygen status, he was given PRN xanax per order which seemed to help. He was also able to work with PT some this morning.   She also stated that her grandfather and grandmother prefer that she be the primary contact for updates (grandmother is also recovering from covid at home).  Clayton Sawyer is also bringing a pillow to him this weekend that will aid his ability to prone. She is agreeable to the plan, and was encouraged to call with any concerns. Will continue to monitor patient closely.

## 2019-02-22 NOTE — Plan of Care (Signed)
  Problem: Education: Goal: Knowledge of General Education information will improve Description: Including pain rating scale, medication(s)/side effects and non-pharmacologic comfort measures Outcome: Progressing   Problem: Health Behavior/Discharge Planning: Goal: Ability to manage health-related needs will improve Outcome: Progressing   Problem: Clinical Measurements: Goal: Ability to maintain clinical measurements within normal limits will improve Outcome: Progressing Goal: Will remain free from infection Outcome: Progressing Goal: Diagnostic test results will improve Outcome: Progressing Goal: Cardiovascular complication will be avoided Outcome: Progressing   Problem: Activity: Goal: Risk for activity intolerance will decrease Outcome: Progressing   Problem: Nutrition: Goal: Adequate nutrition will be maintained Outcome: Progressing   Problem: Coping: Goal: Level of anxiety will decrease Outcome: Progressing   Problem: Elimination: Goal: Will not experience complications related to bowel motility Outcome: Progressing Goal: Will not experience complications related to urinary retention Outcome: Progressing   Problem: Pain Managment: Goal: General experience of comfort will improve Outcome: Progressing   Problem: Safety: Goal: Ability to remain free from injury will improve Outcome: Progressing   Problem: Skin Integrity: Goal: Risk for impaired skin integrity will decrease Outcome: Progressing   Problem: Education: Goal: Knowledge of risk factors and measures for prevention of condition will improve Outcome: Progressing   Problem: Coping: Goal: Psychosocial and spiritual needs will be supported Outcome: Progressing   Problem: Respiratory: Goal: Will maintain a patent airway Outcome: Progressing Goal: Complications related to the disease process, condition or treatment will be avoided or minimized Outcome: Progressing

## 2019-02-22 NOTE — Progress Notes (Signed)
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  Pt seen on 15 L HFNC and was able to ambulate @ 10 feet to chair, desat to 82, requiring vc to slow RR, with eventual return of SpO2 to to 89/90. Pt states "I just get anxious". Began educating pt on visual imagery and relaxation techniques to incorporate into sessions. Pt requires overall min A with ADL tasks due to poor activity tolerance.  Pt discussed feelings of loss regarding his "roommate" in ICU who passed away yesterday. Provided active listening.  Will follow acutely and recommend [facility] for follow up.     02/22/19 1500  OT Visit Information  Last OT Received On 02/22/19  Assistance Needed +1  History of Present Illness Pt is a 78 y.o. male admitted 02/20/2019 with severe acute respiratory failure with hypoxemia from SARS-CoV-2. PMH includes CAD, HTN, HF, CVA, OSA.  Precautions  Precautions Fall  Precaution Comments monitor O2  Restrictions  Weight Bearing Restrictions No  Home Living  Family/patient expects to be discharged to: Private residence  Living Arrangements Spouse/significant other  Available Help at Discharge Family;Available 24 hours/day  Type of Home House  Home Access Stairs to enter  Entrance Stairs-Number of Steps 2-3  Entrance Stairs-Rails Right  Home Layout One level  Bathroom Shower/Tub Tub/shower unit (jacuzzi tub with shower)  Bathroom Toilet Handicapped height  Bathroom Accessibility Yes  How Accessible Accessible via walker  [place] - 2 wheels  Additional Comments Wife recently returned home after admission to [facility]  Prior Function  Level of Independence Independent  Comments Pastor at a church in [place]  Pain Assessment  Pain Assessment No/denies pain  Cognition  Arousal/Alertness Awake/alert  Behavior During Therapy Anxious  Overall Cognitive Status Within  Functional Limits for tasks assessed  Upper Extremity Assessment  Upper Extremity Assessment Generalized weakness  Lower Extremity Assessment  Lower Extremity Assessment Defer to PT evaluation  Cervical / Trunk Assessment  Cervical / Trunk Assessment Normal  ADL  Overall ADL's  Needs assistance/impaired  Eating/Feeding Independent  Grooming Set up;Sitting  Upper Body Bathing Minimal assistance;Sitting  Lower Body Bathing Minimal assistance;Sit to/from stand  Upper Body Dressing  Minimal assistance;Sitting  Lower Body Dressing Minimal assistance;Sit to/from Retail buyer Minimal assistance;Ambulation;RW;BSC (ambulated @ 10 ft)  Toileting- Clothing Manipulation and Hygiene Set up  Functional mobility during ADLs Rolling walker;Cueing for safety;Min guard  General ADL Comments Pt unsteady at times with gait, most likely rushing to get to chair due to feeling SOB  Vision- History  Baseline Vision/History Wears glasses  Bed Mobility  General bed mobility comments sitting in recliner  Transfers  Overall transfer level Needs assistance  Transfers Sit to/from Stand  Sit to Stand Min guard  Balance  Sitting balance-Leahy Scale Good  Standing balance-Leahy Scale Fair  Exercises  Exercises General Upper Extremity  General Exercises - Upper Extremity  Shoulder Flexion Strengthening;Both;10 reps;Seated;Theraband  Shoulder ABduction Strengthening;Both;10 reps;Seated;Theraband  Elbow Flexion Strengthening;10 reps;Seated;Theraband  Elbow Extension Strengthening;Both;10 reps;Seated;Theraband  Theraband Level (Shoulder Flexion) Level 1 (Yellow)  Theraband Level (Shoulder Abduction) Level 1 (Yellow)  Theraband Level (Elbow Flexion) Level 1 (Yellow)  Theraband Level (Elbow Extension) Level 1 (Yellow)  OT - End of Session  Equipment Utilized During Treatment Rolling walker;Oxygen (15L HFNC)  Activity Tolerance Patient limited by fatigue  Patient left in chair;with call bell/phone  within reach  Nurse  Communication Mobility status  OT Assessment  OT Recommendation/Assessment Patient needs continued OT Services  OT Visit Diagnosis Unsteadiness on feet (R26.81);Muscle weakness (generalized) (M62.81)  OT Problem List Decreased activity tolerance;Decreased strength;Decreased safety awareness;Decreased knowledge of use of DME or AE;Cardiopulmonary status limiting activity  OT Plan  OT Frequency (ACUTE ONLY) Min 3X/week  OT Treatment/Interventions (ACUTE ONLY) Self-care/ADL training;Therapeutic exercise;Neuromuscular education;Energy conservation;DME and/or AE instruction;Therapeutic activities;Patient/family education  AM-PAC OT "6 Clicks" Daily Activity Outcome Measure (Version 2)  Help from another person eating meals? 4  Help from another person taking care of personal grooming? 3  Help from another person toileting, which includes using toliet, bedpan, or urinal? 3  Help from another person bathing (including washing, rinsing, drying)? 3  Help from another person to put on and taking off regular upper body clothing? 3  Help from another person to put on and taking off regular lower body clothing? 3  6 Click Score 19  OT Recommendation  Follow Up Recommendations Home health OT;Supervision - Intermittent (pending progress)  OT Equipment 3 in 1 bedside commode  Individuals Consulted  Consulted and Agree with Results and Recommendations Patient  Acute Rehab OT Goals  Patient Stated Goal Return home to wife  OT Goal Formulation With patient  Time For Goal Achievement 03/08/19  Potential to Achieve Goals Good  OT Time Calculation  OT Start Time (ACUTE ONLY) 1250  OT Stop Time (ACUTE ONLY) 1330  OT Time Calculation (min) 40 min  OT General Charges  $OT Visit 1 Visit  OT Evaluation  $OT Eval Moderate Complexity 1 Mod  OT Treatments  $Self Care/Home Management  8-22 mins  $Therapeutic Exercise 8-22 mins  Written Expression  Dominant Hand Left  Maurie Boettcher, OT/L    Acute OT Clinical Specialist [place] Pager [phone] Office [phone]

## 2019-02-23 LAB — COMPREHENSIVE METABOLIC PANEL
ALT: 57 U/L — ABNORMAL HIGH (ref 0–44)
AST: 37 U/L (ref 15–41)
Albumin: 3.1 g/dL — ABNORMAL LOW (ref 3.5–5.0)
Alkaline Phosphatase: 65 U/L (ref 38–126)
Anion gap: 9 (ref 5–15)
BUN: 29 mg/dL — ABNORMAL HIGH (ref 8–23)
CO2: 28 mmol/L (ref 22–32)
Calcium: 8.4 mg/dL — ABNORMAL LOW (ref 8.9–10.3)
Chloride: 103 mmol/L (ref 98–111)
Creatinine, Ser: 0.97 mg/dL (ref 0.61–1.24)
GFR calc Af Amer: >60 mL/min (ref >60)
GFR calc non Af Amer: >60 mL/min (ref >60)
Glucose, Bld: 106 mg/dL — ABNORMAL HIGH (ref 70–99)
Potassium: 4.9 mmol/L (ref 3.5–5.1)
Sodium: 140 mmol/L (ref 135–145)
Total Bilirubin: 1.5 mg/dL — ABNORMAL HIGH (ref 0.3–1.2)
Total Protein: 5.5 g/dL — ABNORMAL LOW (ref 6.5–8.1)

## 2019-02-23 LAB — GLUCOSE, CAPILLARY
Glucose-Capillary: 96 mg/dL (ref 70–99)
Glucose-Capillary: 216 mg/dL — ABNORMAL HIGH (ref 70–99)
Glucose-Capillary: 145 mg/dL — ABNORMAL HIGH (ref 70–99)
Glucose-Capillary: 134 mg/dL — ABNORMAL HIGH (ref 70–99)
Glucose-Capillary: 125 mg/dL — ABNORMAL HIGH (ref 70–99)

## 2019-02-23 LAB — D-DIMER, QUANTITATIVE: D-Dimer, Quant: 2.5 ug/mL-FEU — ABNORMAL HIGH (ref 0.00–0.50)

## 2019-02-23 MED ORDER — PHENOL 1.4 % MT LIQD
1.0000 | OROMUCOSAL | Status: DC | PRN
  Administered 2019-02-23: 1 via OROMUCOSAL
  Filled 2019-02-23: qty 177

## 2019-02-23 MED ORDER — GUAIFENESIN-DM 100-10 MG/5ML PO SYRP
5.0000 mL | ORAL_SOLUTION | ORAL | Status: DC | PRN
  Administered 2019-02-23 – 2019-02-26 (×5): 5 mL via ORAL
  Filled 2019-02-23 (×5): qty 10

## 2019-02-23 MED ORDER — FUROSEMIDE 10 MG/ML IJ SOLN
40.0000 mg | Freq: Once | INTRAMUSCULAR | Status: AC
  Administered 2019-02-23: 11:00:00 40 mg via INTRAVENOUS
  Filled 2019-02-23: qty 4

## 2019-02-23 NOTE — Progress Notes (Signed)
Gherghe, MD was notified regarding the pt desatting to mid 70's on 15L/HFNC. This occurs with activity and when the pt is eating. The pt was able to recover to 88% after a few minutes. MD requested to be notified if the pt desats to the 70's and is unable to recover after a few minutes. I will continue to monitor.

## 2019-02-23 NOTE — Progress Notes (Signed)
PROGRESS NOTE  Clayton Sawyer:[id number] DOB: Jun 19, 1941 DOA: 02/24/2019 PCP: Clayton Marble, MD   LOS: 10 days   Brief Narrative / Interim history: 78 year old male with hypertension, prostate cancer, prior CVA who was admitted to the hospital 02/20/2019 from [facility] with profound hypoxic respiratory failure in the setting of COVID-19, and admitted to the ICU requiring heated high flow nasal cannula.  Patient was transferred to the floor on 7/23 as he wished to get out of the ICU and met criteria for the floor requiring 15 L high flow nasal cannula  Subjective: Appears slightly anxious, sitting in the chair, states that his breathing is improved.  He denies any chest pain, denies any abdominal pain, no nausea or vomiting.  Tells me he gets quite short of breath with minimal activity  Assessment & Plan: Principal Problem:   Acute respiratory disease due to COVID-19 virus Active Problems:   Presence of cardiac pacemaker   History of stroke   Essential (primary) hypertension   Atherosclerotic heart disease of native coronary artery without angina pectoris/PCI stents 09/2004, 03/2009 and 10/2011   COVID-19 virus infection   Dyspnea and respiratory abnormalities  Principal Problem Acute Hypoxic Respiratory Failure due to Covid-19 Viral Illness -Patient was admitted to the ICU initially requiring heated high flow nasal cannula, was able to be weaned down to 15 L high flow and then transferred to the floor on 7/23, PM. -Respiratory status remains tenuous, he is still on 15 L high flow nasal cannula, satting in the mid to upper 80s at rest and is satting into the 70s with minimal activity.  With that, he becomes quite short of breath and it takes him a few good minutes to recover.  He is unable to lay prone, granddaughter will bring him a special pillow and he will attempt to do that later today. -Discussed case with Dr. Lake Bells on 7/24, continue to closely monitor on the floor for now, added  Xanax for anxiety.  -Finished a course of Remdesivir, currently on steroids, inflammatory markers improving -We will give a dose of Lasix today for slightly elevated blood pressure -Low threshold to move back to ICU  COVID-19 Labs  Recent Labs    02/21/19 0611 02/22/19 0500 02/23/19 0529  DDIMER 3.46* 3.20* 2.50*    Lab Results  Component Value Date   SARSCOV2NAA POSITIVE (A) 02/28/2019   SARSCOV2NAA Detected (A) 02/08/2019   Of note, patient was enrolled into fibrogen study by Dr. Lynford Sawyer  Active Problems History of CVA -Continue Plavix, reported intolerance to statins  Essential hypertension -Continue metoprolol, blood pressure stable, Lasix today  Hyperglycemia due to steroids -On sliding scale, CBGs stable  Bacteremia -1/4 bottles with coag negative staph: Another bottle with staph hominis, considered contaminants.  He was briefly on vancomycin which is now been discontinued  History of CAD with prior PCI -No chest pain, continue beta-blocker, Plavix  History of prostate cancer -Outpatient follow-up, currently under surveillance  Dual-lead pacemaker -Follow[facility] at [facility] for chronotropic incompetence   Scheduled Meds: . ALPRAZolam  0.5 mg Oral BID  . Chlorhexidine Gluconate Cloth  6 each Topical Daily  . clopidogrel  75 mg Oral Daily  . enoxaparin (LOVENOX) injection  40 mg Subcutaneous Q12H  . feeding supplement (ENSURE ENLIVE)  237 mL Oral TID BM  . insulin aspart  0-20 Units Subcutaneous TID WC  . insulin aspart  0-5 Units Subcutaneous QHS  . mouth rinse  15 mL Mouth Rinse BID  . methylPREDNISolone sodium succinate  60 mg Intravenous Q24H  . metoprolol succinate  25 mg Oral Daily  . pantoprazole  40 mg Oral Daily  . polyethylene glycol  17 g Oral BID  . sodium chloride flush  10-40 mL Intracatheter Q12H  . sodium chloride flush  3 mL Intravenous Q12H  . pamrevlumab or placebo  35 mg/kg Intravenous Q7 days   Followed by  . [START ON 03/14/2019]  pamrevlumab or placebo  35 mg/kg Intravenous Once  . ascorbic acid  500 mg Oral Daily  . zinc sulfate  220 mg Oral Daily   Continuous Infusions: . sodium chloride     PRN Meds:.sodium chloride, acetaminophen **OR** acetaminophen, ALPRAZolam, guaiFENesin-codeine, guaiFENesin-dextromethorphan, hydrALAZINE, Ipratropium-Albuterol, lip balm, nitroGLYCERIN, ondansetron **OR** ondansetron (ZOFRAN) IV, phenol, polyethylene glycol, sodium chloride, sodium chloride flush, sodium chloride flush, traZODone  DVT prophylaxis: Lovenox Code Status: Full code Family Communication: d/w patient  Disposition Plan: TBD  Consultants:   PCCM  Procedures:   None   Antimicrobials:  None currently  Ceftriaxone July 14 Azithromycin July 15 Remdesivir July 15 > July 19 Actemra July 15, July 19 Vancomycin July 18, July 19 Solumedrol July 14 Pamrevlumab vs placebo, next scheduled infusion July 29  Objective: Vitals:   02/23/19 0435 02/23/19 0816 02/23/19 1119 02/23/19 1131  BP: 140/77 (!) 151/87  (!) 155/76  Pulse:  63 (!) 103   Resp:  (!) 25 (!) 30   Temp: (!) 97 F (36.1 C) (!) 97.5 F (36.4 C)  98.1 F (36.7 C)  TempSrc: Axillary Axillary  Axillary  SpO2:  (!) 85% (!) 89%   Weight:      Height:        Intake/Output Summary (Last 24 hours) at 02/23/2019 1136 Last data filed at 02/23/2019 0830 Gross per 24 hour  Intake 120 ml  Output 700 ml  Net -580 ml   Filed Weights   02/22/2019 2152  Weight: 105.7 kg    Examination:  Constitutional: Tachypneic but speaks in full sentences and at rest appears comfortable Eyes: No scleral icterus ENMT: Moist mucous membranes Neck: normal, supple Respiratory: Coarse breath sounds bilaterally, no wheezing, tachypneic, shallow breathing Cardiovascular: Regular rate and rhythm, no murmurs appreciated.  No peripheral edema Abdomen: Soft, NT, ND, positive bowel sounds Skin: No rashes seen Neurologic: No focal deficits, alert and oriented x3    Data Reviewed: I have independently reviewed following labs and imaging studies   CBC: Recent Labs  Lab 02/17/19 0500 02/18/19 0500 02/19/19 0500 02/20/19 0500 02/21/19 0611  WBC 12.5* 14.5* 16.7* 17.5* 18.4*  NEUTROABS  --   --   --   --  16.1*  HGB 14.3 15.0 15.5 16.3 15.8  HCT 43.2 44.8 47.8 50.4 48.9  MCV 90.9 90.7 91.0 91.1 91.9  PLT 228 226 246 233 601   Basic Metabolic Panel: Recent Labs  Lab 02/17/19 0500 02/18/19 0500 02/19/19 0500 02/20/19 0500 02/21/19 0611 02/22/19 0500 02/23/19 0529  NA 141 141 140 141 136 137 140  K 4.8 4.8 4.9 4.8 5.1 4.3 4.9  CL 106 107 106 104 100 99 103  CO2 27 25 25 27 28 28 28   GLUCOSE 155* 144* 167* 142* 144* 104* 106*  BUN 34* 35* 35* 36* 34* 36* 29*  CREATININE 0.99 1.08 1.06 1.08 1.06 1.17 0.97  CALCIUM 8.2* 8.1* 8.3* 8.5* 8.2* 8.1* 8.4*  MG 2.4 2.2  --   --   --   --   --   PHOS 3.5 3.9  --   --   --   --   --  GFR: Estimated Creatinine Clearance: 75.2 mL/min (by C-G formula based on SCr of 0.97 mg/dL). Liver Function Tests: Recent Labs  Lab 02/19/19 0500 02/20/19 0500 02/21/19 0611 02/22/19 0500 02/23/19 0529  AST 34 36 32 36 37  ALT 43 50* 47* 49* 57*  ALKPHOS 67 68 61 55 65  BILITOT 0.8 1.3* 1.2 1.4* 1.5*  PROT 5.4* 5.7* 5.6* 5.2* 5.5*  ALBUMIN 2.9* 3.1* 3.0* 2.9* 3.1*   No results for input(s): LIPASE, AMYLASE in the last 168 hours. No results for input(s): AMMONIA in the last 168 hours. Coagulation Profile: No results for input(s): INR, PROTIME in the last 168 hours. Cardiac Enzymes: No results for input(s): CKTOTAL, CKMB, CKMBINDEX, TROPONINI in the last 168 hours. BNP (last 3 results) No results for input(s): PROBNP in the last 8760 hours. HbA1C: No results for input(s): HGBA1C in the last 72 hours. CBG: Recent Labs  Lab 02/22/19 0758 02/22/19 1213 02/22/19 1655 02/22/19 2111 02/23/19 0812  GLUCAP 112* 184* 138* 133* 96   Lipid Profile: No results for input(s): CHOL, HDL, LDLCALC, TRIG,  CHOLHDL, LDLDIRECT in the last 72 hours. Thyroid Function Tests: No results for input(s): TSH, T4TOTAL, FREET4, T3FREE, THYROIDAB in the last 72 hours. Anemia Panel: No results for input(s): VITAMINB12, FOLATE, FERRITIN, TIBC, IRON, RETICCTPCT in the last 72 hours. Urine analysis:    Component Value Date/Time   COLORURINE YELLOW (A) 12/07/2017 0008   APPEARANCEUR CLEAR (A) 12/07/2017 0008   APPEARANCEUR Clear 04/18/2013 2216   LABSPEC 1.021 12/07/2017 0008   LABSPEC 1.025 04/18/2013 2216   PHURINE 5.0 12/07/2017 0008   GLUCOSEU NEGATIVE 12/07/2017 0008   GLUCOSEU Negative 04/18/2013 2216   HGBUR NEGATIVE 12/07/2017 0008   BILIRUBINUR NEGATIVE 12/07/2017 0008   BILIRUBINUR Negative 04/18/2013 2216   KETONESUR NEGATIVE 12/07/2017 0008   PROTEINUR NEGATIVE 12/07/2017 0008   NITRITE NEGATIVE 12/07/2017 0008   LEUKOCYTESUR NEGATIVE 12/07/2017 0008   LEUKOCYTESUR Negative 04/18/2013 2216   Sepsis Labs: Invalid input(s): PROCALCITONIN, LACTICIDVEN  Recent Results (from the past 240 hour(s))  Culture, blood (routine x 2)     Status: Abnormal   Collection Time: 02/14/19  6:50 PM   Specimen: BLOOD RIGHT HAND  Result Value Ref Range Status   Specimen Description   Final    BLOOD RIGHT HAND Performed at [facility] [address], [place], [place] [address]    Special Requests   Final    BOTTLES DRAWN AEROBIC ONLY Blood Culture adequate volume Performed at [facility] [address] [place], [place] [address]    Culture  Setup Time   Final    AEROBIC BOTTLE ONLY GRAM POSITIVE COCCI CRITICAL VALUE NOTED.  VALUE IS CONSISTENT WITH PREVIOUSLY REPORTED AND CALLED VALUE.    Culture (A)  Final    STAPHYLOCOCCUS SPECIES (COAGULASE NEGATIVE) THE SIGNIFICANCE OF ISOLATING THIS ORGANISM FROM A SINGLE SET OF BLOOD CULTURES WHEN MULTIPLE SETS ARE DRAWN IS UNCERTAIN. PLEASE NOTIFY THE MICROBIOLOGY DEPARTMENT WITHIN ONE WEEK IF SPECIATION AND  SENSITIVITIES ARE REQUIRED. Performed at [facility], [place] [address], [place], [place] [address]    Report Status 02/17/2019 FINAL  Final  Culture, blood (routine x 2)     Status: None   Collection Time: 02/14/19  6:55 PM   Specimen: BLOOD RIGHT FOREARM  Result Value Ref Range Status   Specimen Description   Final    BLOOD RIGHT FOREARM Performed at [facility], [place] [address], [place], [place] [address]  Special Requests   Final    BOTTLES DRAWN AEROBIC ONLY Blood Culture results may not be optimal due to an excessive volume of blood received in culture bottles Performed at [facility] [address], [place], [place] [address]    Culture   Final    NO GROWTH 5 DAYS Performed at [facility], [place] [address], [place], [place] [address]    Report Status 02/19/2019 FINAL  Final      Radiology Studies: No results found.  Marzetta Board, MD, PhD [facility]  Contact via  [email or web address]  [place] P: [phone] F: [phone]

## 2019-02-23 NOTE — Progress Notes (Signed)
Notified grandaughter, Apolonio Schneiders [phone]) of progress.  All questions were answered and this nurse's contact number shared for further communication.

## 2019-02-23 NOTE — Progress Notes (Signed)
The pt's Granddaughter was called and provided with a daily update regarding the pt's current status. Any questions or concerns were addressed.

## 2019-02-23 NOTE — Progress Notes (Signed)
Patient sats low 80's, placed on NRB, sats increased to 91-94% Instructed patient to wear NRB for a while before attempting to prone.  Patient in the chair at this time.

## 2019-02-24 ENCOUNTER — Inpatient Hospital Stay (HOSPITAL_COMMUNITY): Payer: Medicare Other

## 2019-02-24 DIAGNOSIS — E669 Obesity, unspecified: Secondary | ICD-10-CM | POA: Diagnosis present

## 2019-02-24 DIAGNOSIS — F419 Anxiety disorder, unspecified: Secondary | ICD-10-CM | POA: Diagnosis present

## 2019-02-24 LAB — COMPREHENSIVE METABOLIC PANEL
ALT: 70 U/L — ABNORMAL HIGH (ref 0–44)
AST: 44 U/L — ABNORMAL HIGH (ref 15–41)
Albumin: 3.3 g/dL — ABNORMAL LOW (ref 3.5–5.0)
Alkaline Phosphatase: 65 U/L (ref 38–126)
Anion gap: 11 (ref 5–15)
BUN: 33 mg/dL — ABNORMAL HIGH (ref 8–23)
CO2: 30 mmol/L (ref 22–32)
Calcium: 8.6 mg/dL — ABNORMAL LOW (ref 8.9–10.3)
Chloride: 98 mmol/L (ref 98–111)
Creatinine, Ser: 1.14 mg/dL (ref 0.61–1.24)
GFR calc Af Amer: >60 mL/min (ref >60)
GFR calc non Af Amer: >60 mL/min (ref >60)
Glucose, Bld: 109 mg/dL — ABNORMAL HIGH (ref 70–99)
Potassium: 4.5 mmol/L (ref 3.5–5.1)
Sodium: 139 mmol/L (ref 135–145)
Total Bilirubin: 1.8 mg/dL — ABNORMAL HIGH (ref 0.3–1.2)
Total Protein: 5.8 g/dL — ABNORMAL LOW (ref 6.5–8.1)

## 2019-02-24 LAB — POCT I-STAT 7, (LYTES, BLD GAS, ICA,H+H)
Acid-Base Excess: 4 mmol/L — ABNORMAL HIGH (ref 0.0–2.0)
Bicarbonate: 28.9 mmol/L — ABNORMAL HIGH (ref 20.0–28.0)
Calcium, Ion: 1.22 mmol/L (ref 1.15–1.40)
HCT: 53 % — ABNORMAL HIGH (ref 39.0–52.0)
Hemoglobin: 18 g/dL — ABNORMAL HIGH (ref 13.0–17.0)
O2 Saturation: 88 %
Patient temperature: 97.8
Potassium: 4.2 mmol/L (ref 3.5–5.1)
Sodium: 134 mmol/L — ABNORMAL LOW (ref 135–145)
TCO2: 30 mmol/L (ref 22–32)
pCO2 arterial: 41.8 mmHg (ref 32.0–48.0)
pH, Arterial: 7.446 (ref 7.350–7.450)
pO2, Arterial: 51 mmHg — ABNORMAL LOW (ref 83.0–108.0)

## 2019-02-24 LAB — CBC
HCT: 51.5 % (ref 39.0–52.0)
Hemoglobin: 17.1 g/dL — ABNORMAL HIGH (ref 13.0–17.0)
MCH: 30.5 pg (ref 26.0–34.0)
MCHC: 33.2 g/dL (ref 30.0–36.0)
MCV: 91.8 fL (ref 80.0–100.0)
Platelets: 147 10*3/uL — ABNORMAL LOW (ref 150–400)
RBC: 5.61 MIL/uL (ref 4.22–5.81)
RDW: 16 % — ABNORMAL HIGH (ref 11.5–15.5)
WBC: 17.5 10*3/uL — ABNORMAL HIGH (ref 4.0–10.5)
nRBC: 0 % (ref 0.0–0.2)

## 2019-02-24 LAB — PROCALCITONIN: Procalcitonin: <0.1 ng/mL

## 2019-02-24 LAB — BRAIN NATRIURETIC PEPTIDE: B Natriuretic Peptide: 72.6 pg/mL (ref 0.0–100.0)

## 2019-02-24 LAB — GLUCOSE, CAPILLARY
Glucose-Capillary: 110 mg/dL — ABNORMAL HIGH (ref 70–99)
Glucose-Capillary: 132 mg/dL — ABNORMAL HIGH (ref 70–99)
Glucose-Capillary: 181 mg/dL — ABNORMAL HIGH (ref 70–99)
Glucose-Capillary: 132 mg/dL — ABNORMAL HIGH (ref 70–99)

## 2019-02-24 LAB — D-DIMER, QUANTITATIVE: D-Dimer, Quant: 2.49 ug/mL-FEU — ABNORMAL HIGH (ref 0.00–0.50)

## 2019-02-24 LAB — MAGNESIUM: Magnesium: 2.4 mg/dL (ref 1.7–2.4)

## 2019-02-24 LAB — C-REACTIVE PROTEIN: CRP: <0.8 mg/dL (ref <1.0)

## 2019-02-24 LAB — LACTATE DEHYDROGENASE: LDH: 527 U/L — ABNORMAL HIGH (ref 98–192)

## 2019-02-24 LAB — FERRITIN: Ferritin: 110 ng/mL (ref 24–336)

## 2019-02-24 MED ORDER — MORPHINE SULFATE (PF) 2 MG/ML IV SOLN
2.0000 mg | Freq: Four times a day (QID) | INTRAVENOUS | Status: DC | PRN
  Administered 2019-02-24 – 2019-02-25 (×4): 2 mg via INTRAVENOUS
  Filled 2019-02-24 (×4): qty 1

## 2019-02-24 MED ORDER — TRAZODONE HCL 50 MG PO TABS
100.0000 mg | ORAL_TABLET | Freq: Every evening | ORAL | Status: DC | PRN
  Administered 2019-02-24: 22:00:00 100 mg via ORAL
  Filled 2019-02-24: qty 2

## 2019-02-24 NOTE — Plan of Care (Signed)
Called to bedside by RN. Pt desaturating. Needed NRB and 15 L/min to keep sats mid-80%. Pt absolutely refusing to be transferred back to ICU. States that he had a "bad experience" in ICU.  RN clarifies that pt had two other patients die while he was in the same ICU room.  2 RNs and myself repeatedly asked pt if he could be transferred to ICU. Repeatedly pt refuses to be transferred back to ICU.  Explained to patient that he if he refuses to be transferred to ICU due to worsening hypoxic respiratory failure, that he could die from respiratory failure.  Pt stated he understood but still refuses to be transferred.  Will start pt on heated high flow at 40 L/min and FiO2 100%.  Explained to patient that he if deteriorates, he will be intubated immediately on the floor and then transferred to ICU.  Discussed with RN and shift Freight forwarder. Will attempt to be as prepared as we can with this critically ill patient. Will attempt to have RSI medications near pt's bedside as well as intubation equipment.

## 2019-02-24 NOTE — Progress Notes (Signed)
Patient tolerating NRB well, attempted to place patient back in bed in prone position.  Patient desats immediately to 64% in prone position.  Immediately placed on 15L HFNC and NRB at the same time.  Patient in dangle position on side of bed with increased WOB.  RR 50, Sats 85%, MD, RT, Charge RN, ICU RN, and [facility] all notified and at bedside.  Crash cart moved closer to patient room.  Patient refusing to be moved to ICU.  Patient placed back in chair per request.  Will notify family.

## 2019-02-24 NOTE — Progress Notes (Signed)
PROGRESS NOTE  Clayton Sawyer ULA:[id number] DOB: 12/31/1940 DOA: 02/07/2019 PCP: Jodi Marble, MD  HPI/Recap of past 7 hours: 78 year old male with hypertension, prostate cancer, prior CVA who was admitted to the hospital 02/10/2019 from [facility] with profound hypoxic respiratory failure in the setting of COVID-19, and admitted to the ICU requiring heated high flow nasal cannula.  Patient was transferred to the floor on 7/23 as he wished to get out of the ICU and met criteria for the floor requiring 15 L high flow nasal cannula.  Overnight, patient became even more hypoxic requiring both 15 L high flow nasal cannula plus Venturi mask.  Attempts to prone only led to worsening oxygen desaturations.  When asked again if he would like to be moved back to the ICU, patient refused.  He this morning is quite anxious.  However after receiving Xanax plus mild dose of morphine to ease work of breathing, patient appears to be much more calm and oxygen saturations at 100% on 15 L plus Venturi mask.  He complains of shortness of breath and cough, although he says he does feel that both are better than the previous day.  Denies any pain.  Assessment/Plan: Principal Problem: Acute respiratory failure with hypoxia from acute respiratory disease due to COVID-19 virus: Patient still critically ill, however some of this may in part be due to anxiety.  He does make him mentally feel better to not be in the ICU.  He also seems to improve with mild doses of morphine which we will continue as needed to ease work of breathing.  Continue steroids.  He has finished Actemra and Remdisivir. Active Problems:   Presence of cardiac pacemaker   History of stroke: Continued on Plavix, intolerant to statins.  Hyperglycemia due to steroids -On sliding scale, CBGs stable  Bacteremia -1/4 bottles with coag negative staph: Another bottle with staph hominis, considered contaminants.  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  Has received dose of Lasix.  Code Status: Full code.  Patient is more calm.  Should his respiratory status worsen, will have discussion with him about the need for ICU and intubation and whether he wants to pursue that  Family Communication: Updated granddaughter by phone  Disposition Plan: Hopefully patient will recover from this, be able to be weaned off of oxygen   Consultants:  Critical care  Procedures:  None  Antimicrobials:  None currently  Ceftriaxone July 14 Azithromycin July 15 Remdesivir July 15 > July 19 Actemra July 15, July 19 Vancomycin July 18, July 19 Solumedrol July 14 Pamrevlumab vs placebo, next scheduled infusion July 29  DVT prophylaxis: Lovenox   Objective: Vitals:   02/24/19 0543 02/24/19 0755  BP: (!) 118/58   Pulse:    Resp: (!) 21   Temp: 97.9 F (36.6 C) (!) 97.5 F (36.4 C)  SpO2: 94%     Intake/Output Summary (Last 24 hours) at 02/24/2019 1345 Last data filed at 02/24/2019 1343 Gross per 24 hour  Intake 1080 ml  Output 1700 ml  Net -620 ml   Filed Weights   02/25/2019 2152  Weight: 105.7 kg   Body mass index is 34.41 kg/m.  Exam:  General: Alert and oriented x2, no acute distress  HEENT: Normocephalic and atraumatic, mucous membranes are slightly dry  Neck: Thick, narrow airway  Cardiovascular: Regular rate and rhythm, S1, S2  Respiratory: Scattered rhonchi at the bases  Abdomen: Soft, nontender, nondistended with positive bowel sounds  Musculoskeletal: No clubbing or cyanosis, trace pitting edema  Skin: No skin breaks, tears or lesions  Neuro: No  focal deficits  Psychiatry: Mildly anxious although much improved from previous.  No evidence of acute psychoses   Data Reviewed: CBC: Recent Labs  Lab 02/18/19 0500 02/19/19 0500 02/20/19 0500 02/21/19 0611 02/24/19 0137 02/24/19 0555  WBC 14.5* 16.7* 17.5* 18.4*  --  17.5*  NEUTROABS  --   --   --  16.1*  --   --   HGB 15.0 15.5 16.3 15.8 18.0* 17.1*  HCT 44.8 47.8 50.4 48.9 53.0* 51.5  MCV 90.7 91.0 91.1 91.9  --  91.8  PLT 226 246 233 206  --  517*   Basic Metabolic Panel: Recent Labs  Lab 02/18/19 0500  02/20/19 0500 02/21/19 0611 02/22/19 0500 02/23/19 0529 02/24/19 0137 02/24/19 0555  NA 141   < > 141 136 137 140 134* 139  K 4.8   < > 4.8 5.1 4.3 4.9 4.2 4.5  CL 107   < > 104 100 99 103  --  98  CO2 25   < > _0 --  30  GLUCOSE 144*   < > 142* 144* 104* 106*  --  109*  BUN 35*   < > 36* 34* 36* 29*  --  33*  CREATININE 1.08   < > 1.08 1.06 1.17 0.97  --  1.14  CALCIUM 8.1*   < > 8.5* 8.2* 8.1* 8.4*  --  8.6*  MG 2.2  --   --   --   --   --   --  2.4  PHOS 3.9  --   --   --   --   --   --   --    < > = values in this interval not displayed.   GFR: Estimated Creatinine Clearance: 64 mL/min (by C-G formula based on SCr of 1.14 mg/dL). Liver Function Tests: Recent Labs  Lab 02/20/19 0500 02/21/19 0611 02/22/19 0500 02/23/19 0529 02/24/19 0555  AST 36 32 36 37 44*  ALT 50* 47* 49* 57* 70*  ALKPHOS 68 61 55 65 65  BILITOT 1.3* 1.2 1.4* 1.5* 1.8*  PROT 5.7* 5.6* 5.2* 5.5* 5.8*  ALBUMIN 3.1* 3.0* 2.9* 3.1* 3.3*   No results for input(s): LIPASE, AMYLASE in the last 168 hours. No results for input(s): AMMONIA in the last 168 hours. Coagulation Profile: No results for input(s): INR, PROTIME in the last 168 hours. Cardiac Enzymes: No results for input(s): CKTOTAL, CKMB, CKMBINDEX, TROPONINI in the last 168 hours. BNP (last 3 results) No results for input(s): PROBNP in the last 8760 hours. HbA1C: No results for input(s): HGBA1C in the last 72  hours. CBG: Recent Labs  Lab 02/23/19 1606 02/23/19 1704 02/23/19 2112 02/24/19 0754 02/24/19 1141  GLUCAP 145* 134* 125* 110* 132*   Lipid Profile: No results for input(s): CHOL, HDL, LDLCALC, TRIG, CHOLHDL, LDLDIRECT in the last 72 hours. Thyroid Function Tests: No results for input(s): TSH, T4TOTAL, FREET4, T3FREE, THYROIDAB in the last 72 hours. Anemia Panel: Recent Labs    02/24/19 0555  FERRITIN 110   Urine analysis:    Component Value Date/Time  COLORURINE YELLOW (A) 12/07/2017 0008  ° APPEARANCEUR CLEAR (A) 12/07/2017 0008  ° APPEARANCEUR Clear 04/18/2013 2216  ° LABSPEC 1.021 12/07/2017 0008  ° LABSPEC 1.025 04/18/2013 2216  ° PHURINE 5.0 12/07/2017 0008  ° GLUCOSEU NEGATIVE 12/07/2017 0008  ° GLUCOSEU Negative 04/18/2013 2216  ° HGBUR NEGATIVE 12/07/2017 0008  ° BILIRUBINUR NEGATIVE 12/07/2017 0008  ° BILIRUBINUR Negative 04/18/2013 2216  ° KETONESUR NEGATIVE 12/07/2017 0008  ° PROTEINUR NEGATIVE 12/07/2017 0008  ° NITRITE NEGATIVE 12/07/2017 0008  ° LEUKOCYTESUR NEGATIVE 12/07/2017 0008  ° LEUKOCYTESUR Negative 04/18/2013 2216  ° °Sepsis Labs: °@LABRCNTIP(procalcitonin:4,lacticidven:4) ° °) °Recent Results (from the past 240 hour(s))  °Culture, blood (routine x 2)     Status: Abnormal  ° Collection Time: 02/14/19  6:50 PM  ° Specimen: BLOOD RIGHT HAND  °Result Value Ref Range Status  ° Specimen Description   Final  °  BLOOD RIGHT HAND °Performed at [facility], [address] [place], [place] [address] °  ° Special Requests   Final  °  BOTTLES DRAWN AEROBIC ONLY Blood Culture adequate volume °Performed at [facility], [address] [place], [place] [address] °  ° Culture  Setup Time   Final  °  AEROBIC BOTTLE ONLY °GRAM POSITIVE COCCI °CRITICAL VALUE NOTED.  VALUE IS CONSISTENT WITH PREVIOUSLY REPORTED AND CALLED VALUE. °  ° Culture (A)  Final  °  STAPHYLOCOCCUS SPECIES (COAGULASE NEGATIVE) °THE SIGNIFICANCE OF ISOLATING THIS ORGANISM  FROM A SINGLE SET OF BLOOD CULTURES WHEN MULTIPLE SETS ARE DRAWN IS UNCERTAIN. PLEASE NOTIFY THE MICROBIOLOGY DEPARTMENT WITHIN ONE WEEK IF SPECIATION AND SENSITIVITIES ARE REQUIRED. °Performed at [facility], [address] [place], [place] [address] °  ° Report Status 02/17/2019 FINAL  Final  °Culture, blood (routine x 2)     Status: None  ° Collection Time: 02/14/19  6:55 PM  ° Specimen: BLOOD RIGHT FOREARM  °Result Value Ref Range Status  ° Specimen Description   Final  °  BLOOD RIGHT FOREARM °Performed at [facility], [address] [place], [place] [address] °  ° Special Requests   Final  °  BOTTLES DRAWN AEROBIC ONLY Blood Culture results may not be optimal due to an excessive volume of blood received in culture bottles °Performed at [facility], [address] [place], [place] [address] °  ° Culture   Final  °  NO GROWTH 5 DAYS °Performed at [facility], [address] [place],  [address] °  ° Report Status 02/19/2019 FINAL  Final  °  ° ° °Studies: °Dg Chest Port 1 View ° °Result Date: 02/24/2019 °CLINICAL DATA:  77-year-old male with history of shortness of breath. EXAM: PORTABLE CHEST 1 VIEW COMPARISON:  Chest x-ray 02/18/2019. FINDINGS: There is a right upper extremity PICC with tip terminating in the superior cavoatrial junction. Right-sided pacemaker with leads projected over the expected locations of the right atrium and right ventricle. Lung volumes are low. Extensive multifocal interstitial and airspace disease throughout the mid to lower lungs bilaterally. Probable bibasilar areas of subsegmental atelectasis. No definite pleural effusions. No evidence of pulmonary edema. Heart size is upper limits of normal. Upper mediastinal contours are within normal limits. IMPRESSION: 1. Support apparatus, as above. 2. Multilobar pneumonia redemonstrated. 3. Low lung volumes. Electronically Signed   By: Daniel  Entrikin M.D.   On: 02/24/2019 06:37   ° ° °Scheduled Meds: °• ALPRAZolam  0.5 mg Oral BID  °•   Chlorhexidine Gluconate Cloth  6 each Topical Daily   clopidogrel  75 mg Oral Daily   enoxaparin (LOVENOX) injection  40 mg Subcutaneous Q12H   feeding supplement (ENSURE ENLIVE)  237 mL Oral TID BM   insulin aspart  0-20 Units Subcutaneous TID WC   insulin aspart  0-5 Units Subcutaneous QHS   mouth rinse  15 mL Mouth Rinse BID   methylPREDNISolone sodium succinate  60 mg Intravenous Q24H   metoprolol succinate  25 mg Oral Daily   pantoprazole  40 mg Oral Daily   polyethylene glycol  17 g Oral BID   sodium chloride flush  10-40 mL Intracatheter Q12H   sodium chloride flush  3 mL Intravenous Q12H   pamrevlumab or placebo  35 mg/kg Intravenous Q7 days   Followed by   Derrill Memo ON 03/13/2019] pamrevlumab or placebo  35 mg/kg Intravenous Once   ascorbic acid  500 mg Oral Daily   zinc sulfate  220 mg Oral Daily    Continuous Infusions:  sodium chloride       LOS: 11 days     Annita Brod, MD [facility]  To reach me or the doctor on call, go to: [email or web address] Password [email or web address]  02/24/2019, 1:45 PM

## 2019-02-24 NOTE — Progress Notes (Signed)
Notified grandaughter of progress.

## 2019-02-25 LAB — D-DIMER, QUANTITATIVE: D-Dimer, Quant: 1.81 ug/mL-FEU — ABNORMAL HIGH (ref 0.00–0.50)

## 2019-02-25 LAB — GLUCOSE, CAPILLARY
Glucose-Capillary: 95 mg/dL (ref 70–99)
Glucose-Capillary: 182 mg/dL — ABNORMAL HIGH (ref 70–99)
Glucose-Capillary: 161 mg/dL — ABNORMAL HIGH (ref 70–99)
Glucose-Capillary: 136 mg/dL — ABNORMAL HIGH (ref 70–99)

## 2019-02-25 LAB — C-REACTIVE PROTEIN: CRP: <0.8 mg/dL (ref <1.0)

## 2019-02-25 LAB — FERRITIN: Ferritin: 118 ng/mL (ref 24–336)

## 2019-02-25 MED ORDER — MORPHINE SULFATE (PF) 2 MG/ML IV SOLN
1.0000 mg | INTRAVENOUS | Status: DC
  Administered 2019-02-25 – 2019-02-27 (×12): 1 mg via INTRAVENOUS
  Filled 2019-02-25 (×11): qty 1

## 2019-02-25 MED ORDER — ALPRAZOLAM 0.5 MG PO TABS
0.5000 mg | ORAL_TABLET | Freq: Three times a day (TID) | ORAL | Status: DC
  Administered 2019-02-25 – 2019-02-26 (×4): 0.5 mg via ORAL
  Filled 2019-02-25 (×4): qty 1

## 2019-02-25 NOTE — Progress Notes (Addendum)
Occupational Therapy Treatment Patient Details Name: Clayton Sawyer MRN: [id number] DOB: 12/18/1940 Today's Date: 02/25/2019    History of present illness Pt is a 78 y.o. male admitted 02/12/2019 with severe acute respiratory failure with hypoxemia from SARS-CoV-2. PMH includes CAD, HTN, HF, CVA, OSA.   OT comments  Pt participated in therapeutic ex follow by self care session. Pt initially dropping into mid 70s with any exertion, taking 7 min to recover to 89 on 15 HFNC; RR 40s; HR 100 (Nelcor device used in addition to ear probe). Educated pt on use of visual imagery as relaxation technique to help with anxiety from feeling SOB. Second session of activity, pt drops to low 80s and rebound to 89 in 4 min. Pt then assisted with self care, dropping to high 70s but rebound to 89 in 3 min. Pt appears to be a mouth breather therefore offered the NRB, however pt states that the NRB makes him feel more anxious. Will try to schedule therapy sessions aroudn time for anxiety meds. Pt very appreciative.   Follow Up Recommendations  Home health OT;Supervision - Intermittent    Equipment Recommendations  3 in 1 bedside commode    Recommendations for Other Services      Precautions / Restrictions Precautions Precautions: Fall Precaution Comments: monitor O2       Mobility Bed Mobility               General bed mobility comments: sitting in recliner  Transfers Overall transfer level: Needs assistance Equipment used: Rolling walker (2 wheeled)(for suppport)                  Balance Overall balance assessment: Needs assistance   Sitting balance-Leahy Scale: Good       Standing balance-Leahy Scale: Fair                             ADL either performed or assessed with clinical judgement   ADL Overall ADL's : Needs assistance/impaired     Grooming: Set up;Sitting   Upper Body Bathing: Minimal assistance;Sitting   Lower Body Bathing: Minimal assistance;Sit  to/from stand Lower Body Bathing Details (indicate cue type and reason): pt washed groin area seated. Stood for bottom. Min A to thouroughly clean     Lower Body Dressing: Minimal assistance;Sit to/from stand   Toilet Transfer: Minimal assistance           Functional mobility during ADLs: Rolling walker;Cueing for safety;Minimal assistance General ADL Comments: Limited by fatigue adn 4/4 dypsnea     Vision       Perception     Praxis      Cognition Arousal/Alertness: Awake/alert Behavior During Therapy: Anxious Overall Cognitive Status: Within Functional Limits for tasks assessed                                          Exercises Exercises: Other exercises Other Exercises Other Exercises: Chair marching Other Exercises: sit - stand x 3 Other Exercises: LAQ x 25 each side 2 sets   Shoulder Instructions       General Comments  Pt like peace and quiet. His "calm place" is being with his buddies, drinking coffee and having fellowship.     Pertinent Vitals/ Pain        denies pain  Home Living  Prior Functioning/Environment              Frequency  Min 3X/week        Progress Toward Goals  OT Goals(current goals can now be found in the care plan section)  Progress towards OT goals: Progressing toward goals  Acute Rehab OT Goals Patient Stated Goal: to be able to waqlk into the bathroom OT Goal Formulation: With patient Time For Goal Achievement: 03/08/19 Potential to Achieve Goals: Good ADL Goals Pt Will Perform Lower Body Bathing: with modified independence;sit to/from stand Pt Will Perform Lower Body Dressing: with modified independence;sit to/from stand Pt Will Transfer to Toilet: with modified independence;ambulating Pt Will Perform Toileting - Clothing Manipulation and hygiene: with modified independence;sit to/from stand Additional ADL Goal #1: Pt will independently  verbalize 3 energy conservation strategies Additional ADL Goal #2: Pt will complete ADL task at sink level with SpO2 remaining above 90  Plan Discharge plan remains appropriate    Co-evaluation                 AM-PAC OT "6 Clicks" Daily Activity     Outcome Measure   Help from another person eating meals?: None Help from another person taking care of personal grooming?: A Little Help from another person toileting, which includes using toliet, bedpan, or urinal?: A Little Help from another person bathing (including washing, rinsing, drying)?: A Lot Help from another person to put on and taking off regular upper body clothing?: A Little Help from another person to put on and taking off regular lower body clothing?: A Lot 6 Click Score: 17    End of Session Equipment Utilized During Treatment: Oxygen(15 L)  OT Visit Diagnosis: Unsteadiness on feet (R26.81);Muscle weakness (generalized) (M62.81)   Activity Tolerance Patient tolerated treatment well   Patient Left in chair;with call bell/phone within reach   Nurse Communication Mobility status        Time: 1430-1530 OT Time Calculation (min): 60 min  Charges: OT General Charges $OT Visit: 1 Visit OT Treatments $Self Care/Home Management : 38-52 mins $Therapeutic Exercise: 8-22 mins  Maurie Boettcher, OT/L   Acute OT Clinical Specialist Acute Rehabilitation Services Pager [phone] Office [phone]    [facility] 02/25/2019, 3:36 PM

## 2019-02-25 NOTE — Progress Notes (Signed)
PROGRESS NOTE  Clayton Sawyer ESP:[id number] DOB: [birth year] DOA: 01/30/2019 PCP: Jodi Marble, MD  HPI/Recap of past 58 hours: 78 year old male with hypertension, prostate cancer, prior CVA who was admitted to the hospital 02/19/2019 from [facility] with profound hypoxic respiratory failure in the setting of COVID-19, and admitted to the ICU requiring heated high flow nasal cannula.  Patient was transferred to the floor on 7/23 as he wished to get out of the ICU and met criteria for the floor requiring 15 L high flow nasal cannula.  On night of 7/25, patient became even more hypoxic requiring both 15 L high flow nasal cannula plus Venturi mask.  Attempts to prone only led to worsening oxygen desaturations.  When asked again if he would like to be moved back to the ICU, patient refused.  Anxious the following morning.  However after receiving Xanax plus mild dose of morphine to ease work of breathing, patient appears to be much more calm and oxygen saturations at 100% on 15 L plus Venturi mask.    Doing much better today.  On 15 L Venturi mask alone.  Feels a little less anxious although still stressed.  Assessment/Plan: Principal Problem: Acute respiratory failure with hypoxia from acute respiratory disease due to COVID-19 virus: Patient still critically ill, however some of this may in part be due to anxiety.  It does make him mentally feel better to not be in the ICU.  He also seems to improve with mild doses of morphine which we will continue as needed to ease work of breathing.  Continue steroids.  He has finished Actemra and Remdisivir.  Suspect some of his breathing issues are related to anxiety, see below. Active Problems:   Presence of cardiac pacemaker   History of stroke: Continued on Plavix, intolerant to statins.  Hyperglycemia due to steroids -On sliding scale, CBGs stable  Bacteremia -1/4 bottles with coag negative staph: Another bottle with staph hominis, considered  contaminants.  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  Has received dose of Lasix.  Anxiety: No previous history of.  Likely some severe stress from being in the ICU and seeing the 2 other patients at their expired due to [place].  He seems to be responding well to scheduled doses of Xanax, have changed from twice daily to 3 times daily.  Have also added some low-dose scheduled morphine along with as needed.  We will look at adding an SSRI for long-term benefit.  Patient's granddaughter had requested that he be transferred to another hospital in hopes that the ICU there would not remind him of what had previously happened.  However, patient declined this.  Stated that he is happy where he currently is, just does not want to go back to the ICU or any ICU.  Code Status: Full code.  Patient is more calm.  Should his respiratory status worsen, will have discussion with him about the need for ICU and intubation and whether he wants to pursue that  Family Communication: Updated granddaughter by phone  Disposition Plan: Hopefully patient will recover from this, be able to be weaned down further from oxygen   Consultants:  Critical care  Procedures:  None  Antimicrobials:  None currently  Ceftriaxone July 14 Azithromycin July 15 Remdesivir July 15 > July 19 Actemra July 15, July 19 Vancomycin July 18, July 19 Solumedrol July 14 Pamrevlumab vs placebo, next scheduled infusion July 29  DVT prophylaxis: Lovenox   Objective: Vitals:   02/25/19 1041 02/25/19 1140  BP: 133/64   Pulse: (!) 105 97  Resp:  (!) 35  Temp:     SpO2:  90%    Intake/Output Summary (Last 24 hours) at 02/25/2019 1327 Last data filed at 02/25/2019 1047 Gross per 24 hour  Intake 610 ml  Output 800 ml  Net -190 ml   Filed Weights   02/20/2019 2152  Weight: 105.7 kg   Body mass index is 34.41 kg/m.  Exam:   General: Alert and oriented x2, anxious although less so  HEENT: Normocephalic and atraumatic, mucous membranes are slightly dry  Neck: Thick, narrow airway  Cardiovascular: Regular rate and rhythm, S1, S2, borderline tachycardia  Respiratory: Clear to auscultation bilaterally  Abdomen: Soft, nontender, nondistended with positive bowel sounds  Musculoskeletal: No clubbing or cyanosis, trace pitting edema  Skin: No skin breaks, tears or lesions  Neuro: No focal deficits  Psychiatry: Mildly anxious although much improved from previous.  No evidence of acute psychoses   Data Reviewed: CBC: Recent Labs  Lab 02/19/19 0500 02/20/19 0500 02/21/19 0611 02/24/19 0137 02/24/19 0555  WBC 16.7* 17.5* 18.4*  --  17.5*  NEUTROABS  --   --  16.1*  --   --   HGB 15.5 16.3 15.8 18.0* 17.1*  HCT 47.8 50.4 48.9 53.0* 51.5  MCV 91.0 91.1 91.9  --  91.8  PLT 246 233 206  --  683*   Basic Metabolic Panel: Recent Labs  Lab 02/20/19 0500 02/21/19 0611 02/22/19 0500 02/23/19 0529 02/24/19 0137 02/24/19 0555  NA 141 136 137 140 134* 139  K 4.8 5.1 4.3 4.9 4.2 4.5  CL 104 100 99 103  --  98  CO2 27 28 28 28   --  30  GLUCOSE 142* 144* 104* 106*  --  109*  BUN 36* 34* 36* 29*  --  33*  CREATININE 1.08 1.06 1.17 0.97  --  1.14  CALCIUM 8.5* 8.2* 8.1* 8.4*  --  8.6*  MG  --   --   --   --   --  2.4   GFR: Estimated Creatinine Clearance: 64 mL/min (by C-G formula based on SCr of 1.14 mg/dL). Liver Function Tests: Recent Labs  Lab 02/20/19 0500 02/21/19 0611 02/22/19 0500 02/23/19 0529 02/24/19 0555  AST 36 32 36 37 44*  ALT 50* 47* 49* 57* 70*  ALKPHOS 68 61 55 65 65  BILITOT 1.3* 1.2 1.4* 1.5* 1.8*  PROT  5.7* 5.6* 5.2* 5.5* 5.8*  ALBUMIN 3.1* 3.0* 2.9* 3.1* 3.3*   No results for input(s): LIPASE, AMYLASE in the last 168 hours. No results for input(s): AMMONIA in the last 168 hours. Coagulation Profile: No results for input(s): INR, PROTIME in the last 168 hours. Cardiac Enzymes: No results for input(s): CKTOTAL, CKMB, CKMBINDEX, TROPONINI in the last 168 hours. BNP (last 3 results) No results for input(s): PROBNP in the last 8760 hours. HbA1C: No results for input(s): HGBA1C in the last 72 hours. CBG: Recent Labs  Lab 02/24/19 1141 02/24/19 1749 02/24/19 2138 02/25/19 0808 02/25/19 1113  GLUCAP 132* 181* 132* 95 182*   Lipid Profile: No results for input(s): CHOL, HDL, LDLCALC, TRIG, CHOLHDL, LDLDIRECT in the last  72 hours. Thyroid Function Tests: No results for input(s): TSH, T4TOTAL, FREET4, T3FREE, THYROIDAB in the last 72 hours. Anemia Panel: Recent Labs    02/24/19 0555 02/25/19 0500  FERRITIN 110 118   Urine analysis:    Component Value Date/Time   COLORURINE YELLOW (A) 12/07/2017 0008   APPEARANCEUR CLEAR (A) 12/07/2017 0008   APPEARANCEUR Clear 04/18/2013 2216   LABSPEC 1.021 12/07/2017 0008   LABSPEC 1.025 04/18/2013 2216   PHURINE 5.0 12/07/2017 0008   GLUCOSEU NEGATIVE 12/07/2017 0008   GLUCOSEU Negative 04/18/2013 2216   HGBUR NEGATIVE 12/07/2017 0008   BILIRUBINUR NEGATIVE 12/07/2017 0008   BILIRUBINUR Negative 04/18/2013 2216   KETONESUR NEGATIVE 12/07/2017 0008   PROTEINUR NEGATIVE 12/07/2017 0008   NITRITE NEGATIVE 12/07/2017 0008   LEUKOCYTESUR NEGATIVE 12/07/2017 0008   LEUKOCYTESUR Negative 04/18/2013 2216   Sepsis Labs: @LABRCNTIP (procalcitonin:4,lacticidven:4)  ) No results found for this or any previous visit (from the past 240 hour(s)).    Studies: No results found.  Scheduled Meds: . ALPRAZolam  0.5 mg Oral TID  . Chlorhexidine Gluconate Cloth  6 each Topical Daily  . clopidogrel  75 mg Oral Daily  . enoxaparin (LOVENOX)  injection  40 mg Subcutaneous Q12H  . feeding supplement (ENSURE ENLIVE)  237 mL Oral TID BM  . insulin aspart  0-20 Units Subcutaneous TID WC  . insulin aspart  0-5 Units Subcutaneous QHS  . mouth rinse  15 mL Mouth Rinse BID  . methylPREDNISolone sodium succinate  60 mg Intravenous Q24H  . metoprolol succinate  25 mg Oral Daily  .  morphine injection  1 mg Intravenous Q4H  . pantoprazole  40 mg Oral Daily  . polyethylene glycol  17 g Oral BID  . sodium chloride flush  10-40 mL Intracatheter Q12H  . sodium chloride flush  3 mL Intravenous Q12H  . pamrevlumab or placebo  35 mg/kg Intravenous Q7 days   Followed by  . [START ON 03/13/2019] pamrevlumab or placebo  35 mg/kg Intravenous Once  . ascorbic acid  500 mg Oral Daily  . zinc sulfate  220 mg Oral Daily    Continuous Infusions: . sodium chloride       LOS: 12 days     Annita Brod, MD [facility]  To reach me or the doctor on call, go to: [email or web address] Password [email or web address]  02/25/2019, 1:27 PM

## 2019-02-25 NOTE — Progress Notes (Signed)
Daily updates provided. Returned phone call from wife, Enid Derry [phone].  Wife very appreciative of nursing returning call.  All questions encouraged and answered.  Patient's wife reports that she still would like to keep their grand daughter as her husband's emergency contact.  Granddaughter provided updates by night shift RN and MD to call granddaughter later today.

## 2019-02-26 ENCOUNTER — Inpatient Hospital Stay (HOSPITAL_COMMUNITY): Payer: Medicare Other

## 2019-02-26 DIAGNOSIS — F419 Anxiety disorder, unspecified: Secondary | ICD-10-CM

## 2019-02-26 LAB — CBC
HCT: 51.7 % (ref 39.0–52.0)
Hemoglobin: 16.9 g/dL (ref 13.0–17.0)
MCH: 30.6 pg (ref 26.0–34.0)
MCHC: 32.7 g/dL (ref 30.0–36.0)
MCV: 93.7 fL (ref 80.0–100.0)
Platelets: 120 10*3/uL — ABNORMAL LOW (ref 150–400)
RBC: 5.52 MIL/uL (ref 4.22–5.81)
RDW: 16.1 % — ABNORMAL HIGH (ref 11.5–15.5)
WBC: 23.3 10*3/uL — ABNORMAL HIGH (ref 4.0–10.5)
nRBC: 0 % (ref 0.0–0.2)

## 2019-02-26 LAB — BASIC METABOLIC PANEL
Anion gap: 10 (ref 5–15)
BUN: 30 mg/dL — ABNORMAL HIGH (ref 8–23)
CO2: 29 mmol/L (ref 22–32)
Calcium: 8.4 mg/dL — ABNORMAL LOW (ref 8.9–10.3)
Chloride: 99 mmol/L (ref 98–111)
Creatinine, Ser: 1.11 mg/dL (ref 0.61–1.24)
GFR calc Af Amer: >60 mL/min (ref >60)
GFR calc non Af Amer: >60 mL/min (ref >60)
Glucose, Bld: 100 mg/dL — ABNORMAL HIGH (ref 70–99)
Potassium: 5 mmol/L (ref 3.5–5.1)
Sodium: 138 mmol/L (ref 135–145)

## 2019-02-26 LAB — POCT I-STAT 7, (LYTES, BLD GAS, ICA,H+H)
Acid-Base Excess: 2 mmol/L (ref 0.0–2.0)
Acid-Base Excess: 1 mmol/L (ref 0.0–2.0)
Bicarbonate: 27.7 mmol/L (ref 20.0–28.0)
Bicarbonate: 33.4 mmol/L — ABNORMAL HIGH (ref 20.0–28.0)
Calcium, Ion: 1.22 mmol/L (ref 1.15–1.40)
Calcium, Ion: 1.26 mmol/L (ref 1.15–1.40)
HCT: 53 % — ABNORMAL HIGH (ref 39.0–52.0)
HCT: 57 % — ABNORMAL HIGH (ref 39.0–52.0)
Hemoglobin: 18 g/dL — ABNORMAL HIGH (ref 13.0–17.0)
Hemoglobin: 19.4 g/dL — ABNORMAL HIGH (ref 13.0–17.0)
O2 Saturation: 76 %
O2 Saturation: 94 %
Patient temperature: 97.5
Patient temperature: 99.1
Potassium: 4.9 mmol/L (ref 3.5–5.1)
Potassium: 5.5 mmol/L — ABNORMAL HIGH (ref 3.5–5.1)
Sodium: 133 mmol/L — ABNORMAL LOW (ref 135–145)
Sodium: 135 mmol/L (ref 135–145)
TCO2: 29 mmol/L (ref 22–32)
TCO2: 36 mmol/L — ABNORMAL HIGH (ref 22–32)
pCO2 arterial: 43.2 mmHg (ref 32.0–48.0)
pCO2 arterial: 86.3 mmHg (ref 32.0–48.0)
pH, Arterial: 7.413 (ref 7.350–7.450)
pH, Arterial: 7.197 — CL (ref 7.350–7.450)
pO2, Arterial: 39 mmHg — CL (ref 83.0–108.0)
pO2, Arterial: 93 mmHg (ref 83.0–108.0)

## 2019-02-26 LAB — GLUCOSE, CAPILLARY
Glucose-Capillary: 125 mg/dL — ABNORMAL HIGH (ref 70–99)
Glucose-Capillary: 166 mg/dL — ABNORMAL HIGH (ref 70–99)
Glucose-Capillary: 166 mg/dL — ABNORMAL HIGH (ref 70–99)
Glucose-Capillary: 240 mg/dL — ABNORMAL HIGH (ref 70–99)

## 2019-02-26 LAB — PHOSPHORUS: Phosphorus: 3.6 mg/dL (ref 2.5–4.6)

## 2019-02-26 LAB — D-DIMER, QUANTITATIVE: D-Dimer, Quant: 1.45 ug/mL-FEU — ABNORMAL HIGH (ref 0.00–0.50)

## 2019-02-26 LAB — PROCALCITONIN: Procalcitonin: 0.33 ng/mL

## 2019-02-26 LAB — C-REACTIVE PROTEIN: CRP: <0.8 mg/dL (ref <1.0)

## 2019-02-26 LAB — MAGNESIUM: Magnesium: 2.5 mg/dL — ABNORMAL HIGH (ref 1.7–2.4)

## 2019-02-26 LAB — FERRITIN: Ferritin: 141 ng/mL (ref 24–336)

## 2019-02-26 LAB — BRAIN NATRIURETIC PEPTIDE: B Natriuretic Peptide: 101.3 pg/mL — ABNORMAL HIGH (ref 0.0–100.0)

## 2019-02-26 MED ORDER — MIDAZOLAM HCL 2 MG/2ML IJ SOLN
INTRAMUSCULAR | Status: AC
  Administered 2019-02-26: 21:00:00
  Filled 2019-02-26: qty 4

## 2019-02-26 MED ORDER — CHLORHEXIDINE GLUCONATE 0.12% ORAL RINSE (MEDLINE KIT)
15.0000 mL | Freq: Two times a day (BID) | OROMUCOSAL | Status: DC
  Administered 2019-02-26 – 2019-03-16 (×36): 15 mL via OROMUCOSAL

## 2019-02-26 MED ORDER — PRO-STAT SUGAR FREE PO LIQD
30.0000 mL | Freq: Two times a day (BID) | ORAL | Status: DC
  Administered 2019-02-26: 30 mL
  Filled 2019-02-26: qty 30

## 2019-02-26 MED ORDER — DEXMEDETOMIDINE HCL IN NACL 400 MCG/100ML IV SOLN
0.0000 ug/kg/h | INTRAVENOUS | Status: AC
  Administered 2019-02-26: 0.4 ug/kg/h via INTRAVENOUS
  Administered 2019-02-27 (×4): 1.2 ug/kg/h via INTRAVENOUS
  Administered 2019-02-27: 1.1 ug/kg/h via INTRAVENOUS
  Administered 2019-02-27: 1.2 ug/kg/h via INTRAVENOUS
  Administered 2019-02-28: 0.8 ug/kg/h via INTRAVENOUS
  Administered 2019-02-28: 0.9 ug/kg/h via INTRAVENOUS
  Administered 2019-02-28: 1.2 ug/kg/h via INTRAVENOUS
  Administered 2019-02-28: 0.6 ug/kg/h via INTRAVENOUS
  Administered 2019-02-28: 0.9 ug/kg/h via INTRAVENOUS
  Administered 2019-03-01 (×7): 1.2 ug/kg/h via INTRAVENOUS
  Filled 2019-02-26 (×15): qty 100
  Filled 2019-02-26: qty 200
  Filled 2019-02-26 (×4): qty 100

## 2019-02-26 MED ORDER — ETOMIDATE 2 MG/ML IV SOLN
INTRAVENOUS | Status: AC
  Administered 2019-02-26: 20 mg
  Filled 2019-02-26: qty 20

## 2019-02-26 MED ORDER — TRAZODONE HCL 50 MG PO TABS
100.0000 mg | ORAL_TABLET | Freq: Every day | ORAL | Status: DC
  Administered 2019-02-27 – 2019-03-12 (×14): 100 mg via ORAL
  Filled 2019-02-26 (×14): qty 2

## 2019-02-26 MED ORDER — ROCURONIUM BROMIDE 10 MG/ML (PF) SYRINGE
PREFILLED_SYRINGE | INTRAVENOUS | Status: AC
  Administered 2019-02-26: 21:00:00
  Filled 2019-02-26: qty 10

## 2019-02-26 MED ORDER — FENTANYL CITRATE (PF) 100 MCG/2ML IJ SOLN: 25.0000 ug | INTRAMUSCULAR | Status: DC | PRN

## 2019-02-26 MED ORDER — METHYLPREDNISOLONE SODIUM SUCC 125 MG IJ SOLR
60.0000 mg | Freq: Two times a day (BID) | INTRAMUSCULAR | Status: DC
  Administered 2019-02-26 – 2019-03-08 (×20): 60 mg via INTRAVENOUS
  Filled 2019-02-26 (×20): qty 2

## 2019-02-26 MED ORDER — MIDAZOLAM HCL 2 MG/2ML IJ SOLN
1.0000 mg | INTRAMUSCULAR | Status: DC | PRN
  Administered 2019-02-26: 1 mg via INTRAVENOUS
  Filled 2019-02-26: qty 2

## 2019-02-26 MED ORDER — ORAL CARE MOUTH RINSE
15.0000 mL | OROMUCOSAL | Status: DC
  Administered 2019-02-26 – 2019-03-16 (×173): 15 mL via OROMUCOSAL

## 2019-02-26 MED ORDER — FENTANYL 2500MCG IN NS 250ML (10MCG/ML) PREMIX INFUSION
0.0000 ug/h | INTRAVENOUS | Status: AC
  Administered 2019-02-26: 25 ug/h via INTRAVENOUS
  Administered 2019-02-27 (×2): 250 ug/h via INTRAVENOUS
  Administered 2019-02-28: 150 ug/h via INTRAVENOUS
  Administered 2019-02-28: 350 ug/h via INTRAVENOUS
  Administered 2019-03-01 (×2): 150 ug/h via INTRAVENOUS
  Administered 2019-03-02: 200 ug/h via INTRAVENOUS
  Administered 2019-03-02 – 2019-03-03 (×2): 150 ug/h via INTRAVENOUS
  Administered 2019-03-04: 200 ug/h via INTRAVENOUS
  Administered 2019-03-04: 05:00:00 175 ug/h via INTRAVENOUS
  Filled 2019-02-26 (×12): qty 250

## 2019-02-26 MED ORDER — VITAL HIGH PROTEIN PO LIQD
1000.0000 mL | ORAL | Status: DC
  Administered 2019-02-27: 1000 mL

## 2019-02-26 MED ORDER — MIDAZOLAM HCL 2 MG/2ML IJ SOLN: 1.0000 mg | INTRAMUSCULAR | Status: DC | PRN

## 2019-02-26 MED ORDER — FENTANYL CITRATE (PF) 100 MCG/2ML IJ SOLN
INTRAMUSCULAR | Status: AC
  Administered 2019-02-26: 100 ug via INTRAVENOUS
  Filled 2019-02-26: qty 2

## 2019-02-26 MED ORDER — FENTANYL CITRATE (PF) 100 MCG/2ML IJ SOLN
INTRAMUSCULAR | Status: AC
  Administered 2019-02-26: 21:00:00 50 ug via INTRAVENOUS
  Filled 2019-02-26: qty 2

## 2019-02-26 MED ORDER — FENTANYL CITRATE (PF) 100 MCG/2ML IJ SOLN
25.0000 ug | INTRAMUSCULAR | Status: DC | PRN
  Administered 2019-02-26: 50 ug via INTRAVENOUS
  Administered 2019-02-26 (×2): 100 ug via INTRAVENOUS
  Filled 2019-02-26: qty 2

## 2019-02-26 NOTE — Progress Notes (Signed)
eLink Physician-Brief Progress Note Patient Name: Clayton Sawyer DOB: July 06, 1941 MRN: [id number]   Date of Service  02/26/2019  HPI/Events of Note  Pt recently intubated for acute respiratory failure due to COVID-19 pneumonia, ABG: Hypercapnia, current RR 30. Pt is also sub-optimally sedated.  eICU Interventions  Pt placed on a Fentanyl infusion, will repeat ABG at 1:00 AM and make vent changes accordingly.        Kerry Kass Ogan 02/26/2019, 10:58 PM

## 2019-02-26 NOTE — Progress Notes (Signed)
Called and spoke to patient's granddaughter, Apolonio Schneiders [phone].  Questions encouraged and answered to the best of my ability.  Family understanding of plan of care and patient's condition.  Apolonio Schneiders reports herself and her grandmother have been calling and speaking with the patient directly and encouraging him often.    Granddaughter requesting MD call with updates at his convenience.

## 2019-02-26 NOTE — Progress Notes (Signed)
PROGRESS NOTE  Clayton Sawyer DGL:[id number] DOB: 1941/05/15 DOA: 03/01/2019 PCP: Jodi Marble, MD  HPI/Recap of past 53 hours: 78 year old male with hypertension, prostate cancer, prior CVA who was admitted to the hospital 02/01/2019 from [facility] with profound hypoxic respiratory failure in the setting of COVID-19, and admitted to the ICU requiring heated high flow nasal cannula.  Patient was transferred to the floor on 7/23 as he wished to get out of the ICU and met criteria for the floor requiring 15 L high flow nasal cannula.  On night of 7/25, patient became even more hypoxic requiring both 15 L high flow nasal cannula plus Venturi mask.  Attempts to prone only led to worsening oxygen desaturations.  When asked again if he would like to be moved back to the ICU, patient refused.  Anxious the following morning.  However after receiving Xanax plus mild dose of morphine to ease work of breathing, patient appears to be much more calm and oxygen saturations at 100% on 15 L plus Venturi mask.    Initially, patient showed some improvement by 7/27 still feeling somewhat anxious, but able to be weaned down to just 15 L on nasal cannula without the Venturi mask.  Medications adjusted so that patient is on both scheduled and as needed morphine and Xanax to help with this anxiety which causes him to get tachypneic.  This morning, patient quite tachypneic although does not appear as anxious.  Oxygen saturations however dropping ranging from 77% only up to 84% on 15 L nasal cannula.  Discussed with critical care who are reconsulted.  They had extensive discussion with the patient and he wishes to be a DNR.  ABG ordered noted continued hypoxia and oxygen change to heated high flow.  Assessment/Plan: Principal Problem: Acute respiratory failure with hypoxia from acute respiratory disease due to COVID-19 virus: Patient still critically ill, however some of this may in part be due to anxiety.  It does make  him mentally feel better to not be in the ICU.  It looks like some component of this was anxiety which was improved with low doses of morphine and Xanax.   Continue steroids.  He has finished Actemra and Remdisivir.  Given normal CRP and ferritin levels, I do not think his worsening hypoxia is from persistence of COVID only.  At this time, does not seem to be a role for second dose of Actemra.  Will increase Solu-Medrol to every 12 hours.  Critical care following.  Checking chest x-ray, procalcitonin level and BNP. Active Problems:   Presence of cardiac pacemaker   History of stroke: Continued on Plavix, intolerant to statins.  Hyperglycemia due to steroids -On sliding scale, CBGs stable  Bacteremia -1/4 bottles with coag negative staph: Another bottle with staph hominis, considered contaminants.  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  Has received dose of Lasix.  Anxiety: No previous history of.  Likely some severe stress from being in the ICU and seeing the 2 other patients at their expired due to [place].  He seems to be responding well to scheduled doses of Xanax, have changed from twice daily to 3 times daily.  Have also added some low-dose  scheduled morphine along with as needed.  We will look at adding an SSRI for long-term benefit.  Patient's granddaughter had requested that he be transferred to another hospital in hopes that the ICU there would not remind him of what had previously happened.  However, patient declined this.  Stated that he is happy where he currently is, just does not want to go back to the ICU or any  ICU.  Code Status: Changed to DNR on 7/28 after patient had extensive discussion with critical care  Family Communication: Updated granddaughter by phone  Disposition Plan: Critically ill, patient may not survive this hospitalization   Consultants:  Critical care  Procedures:  None  Antimicrobials:  None currently  Ceftriaxone July 14 Azithromycin July 15 Remdesivir July 15 > July 19 Actemra July 15, July 19 Vancomycin July 18, July 19 Solumedrol July 14 Pamrevlumab vs placebo, next scheduled infusion July 29  DVT prophylaxis: Lovenox   Objective: Vitals:   02/26/19 1345 02/26/19 1353  BP:    Pulse: (!) 104 91  Resp: (!) 44 (!) 52  Temp:    SpO2: (!) 80% (!) 86%    Intake/Output Summary (Last 24 hours) at 02/26/2019 1432 Last data filed at 02/26/2019 1006 Gross per 24 hour  Intake 863 ml  Output 650 ml  Net 213 ml   Filed Weights   02/14/2019 2152  Weight: 105.7 kg   Body mass index is 34.41 kg/m.  Exam:   General: Alert and oriented x2, anxious   HEENT: Normocephalic and atraumatic, mucous membranes are slightly dry  Neck: Thick, narrow airway  Cardiovascular: Regular r rhythm, S1, S2, mildly tachycardic  Respiratory: Clear to auscultation bilaterally, but tachypneic with labored breathing  Abdomen: Soft, nontender, nondistended with positive bowel sounds  Musculoskeletal: No clubbing or cyanosis, trace pitting edema  Skin: No skin breaks, tears or lesions  Neuro: No focal deficits  Psychiatry: Anxious, no evidence of acute psychoses   Data Reviewed: CBC: Recent Labs  Lab 02/20/19 0500 02/21/19 0611 02/24/19 0137 02/24/19 0555 02/26/19 0520 02/26/19 1403  WBC 17.5* 18.4*  --  17.5* 23.3*  --   NEUTROABS  --  16.1*  --   --   --   --   HGB 16.3 15.8 18.0* 17.1* 16.9 18.0*  HCT 50.4 48.9 53.0* 51.5 51.7 53.0*  MCV 91.1 91.9  --  91.8 93.7  --   PLT 233 206  --  147* 120*  --    Basic Metabolic Panel: Recent Labs  Lab  02/21/19 0611 02/22/19 0500 02/23/19 0529 02/24/19 0137 02/24/19 0555 02/26/19 0520 02/26/19 1403  NA 136 137 140 134* 139 138 133*  K 5.1 4.3 4.9 4.2 4.5 5.0 4.9  CL 100 99 103  --  98 99  --   CO2 28 28 28   --  30 29  --   GLUCOSE 144* 104* 106*  --  109* 100*  --   BUN 34* 36* 29*  --  33* 30*  --   CREATININE 1.06 1.17 0.97  --  1.14 1.11  --   CALCIUM 8.2* 8.1* 8.4*  --  8.6* 8.4*  --   MG  --   --   --   --  2.4  --   --    GFR: Estimated Creatinine Clearance: 65.7 mL/min (by C-G formula based on SCr of 1.11 mg/dL). Liver Function Tests: Recent Labs  Lab 02/20/19 0500 02/21/19 2979 02/22/19 0500 02/23/19 0529 02/24/19 0555  AST 36 32 36 37 44*  ALT 50* 47* 49* 57* 70*  ALKPHOS 68 61 55 65 65  BILITOT 1.3* 1.2 1.4* 1.5* 1.8*  PROT 5.7* 5.6* 5.2* 5.5* 5.8*  ALBUMIN 3.1* 3.0* 2.9* 3.1* 3.3*   No results for input(s): LIPASE, AMYLASE in the last 168 hours. No results for input(s): AMMONIA in the last 168 hours. Coagulation Profile: No results for input(s): INR, PROTIME in the last 168 hours. Cardiac Enzymes: No results for input(s): CKTOTAL, CKMB, CKMBINDEX, TROPONINI in the last 168 hours. BNP (last 3 results) No results for input(s): PROBNP in the last 8760 hours. HbA1C: No results for input(s): HGBA1C in the last 72 hours. CBG: Recent Labs  Lab 02/25/19 1113 02/25/19 1649 02/25/19 2045 02/26/19 0914 02/26/19 1251  GLUCAP 182* 161* 136* 125* 166*   Lipid Profile: No results for input(s): CHOL, HDL, LDLCALC, TRIG, CHOLHDL, LDLDIRECT in the last 72 hours. Thyroid Function Tests: No results for input(s): TSH, T4TOTAL, FREET4, T3FREE, THYROIDAB in the last 72 hours. Anemia Panel: Recent Labs    02/25/19 0500 02/26/19 0520  FERRITIN 118 141   Urine analysis:    Component Value Date/Time   COLORURINE YELLOW (A) 12/07/2017 0008   APPEARANCEUR CLEAR (A) 12/07/2017 0008   APPEARANCEUR Clear 04/18/2013 2216   LABSPEC 1.021 12/07/2017 0008   LABSPEC  1.025 04/18/2013 2216   PHURINE 5.0 12/07/2017 0008   GLUCOSEU NEGATIVE 12/07/2017 0008   GLUCOSEU Negative 04/18/2013 2216   HGBUR NEGATIVE 12/07/2017 0008   BILIRUBINUR NEGATIVE 12/07/2017 0008   BILIRUBINUR Negative 04/18/2013 2216   KETONESUR NEGATIVE 12/07/2017 0008   PROTEINUR NEGATIVE 12/07/2017 0008   NITRITE NEGATIVE 12/07/2017 0008   LEUKOCYTESUR NEGATIVE 12/07/2017 0008   LEUKOCYTESUR Negative 04/18/2013 2216   Sepsis Labs: @LABRCNTIP (procalcitonin:4,lacticidven:4)  ) No results found for this or any previous visit (from the past 240 hour(s)).    Studies: No results found.  Scheduled Meds:  ALPRAZolam  0.5 mg Oral TID   Chlorhexidine Gluconate Cloth  6 each Topical Daily   clopidogrel  75 mg Oral Daily   enoxaparin (LOVENOX) injection  40 mg Subcutaneous Q12H   feeding supplement (ENSURE ENLIVE)  237 mL Oral TID BM   insulin aspart  0-20 Units Subcutaneous TID WC   insulin aspart  0-5 Units Subcutaneous QHS   mouth rinse  15 mL Mouth Rinse BID   methylPREDNISolone sodium succinate  60 mg Intravenous Q12H   metoprolol succinate  25 mg Oral Daily    morphine injection  1 mg Intravenous Q4H   pantoprazole  40 mg Oral Daily   polyethylene glycol  17 g Oral BID   sodium chloride flush  10-40 mL Intracatheter Q12H   sodium chloride flush  3 mL Intravenous Q12H   pamrevlumab or placebo  35 mg/kg Intravenous Q7 days   Followed by   Derrill Memo ON 03/13/2019] pamrevlumab or placebo  35 mg/kg Intravenous Once   traZODone  100 mg Oral QHS   ascorbic acid  500 mg Oral Daily   zinc sulfate  220 mg Oral Daily    Continuous Infusions:  sodium chloride       LOS: 13 days     Annita Brod, MD [facility]  To reach me or the doctor on call, go to: [email or web address] Password [facility]  02/26/2019, 2:32 PM

## 2019-02-26 NOTE — Progress Notes (Signed)
Physical Therapy Treatment Patient Details Name: Clayton Sawyer MRN: [id number] DOB: March 13, 1941 Today's Date: 02/26/2019    History of Present Illness Pt is a 78 y.o. male admitted 02/14/2019 with severe acute respiratory failure with hypoxemia from SARS-CoV-2. PMH includes CAD, HTN, HF, CVA, OSA.    PT Comments    The patient is on Heated HFNC, 100%, 35 L.  Reports hat he just finished with urinal.  Sao2 75%, RR 40, HR100. After Each  Activity, Sao2 drop into 70', RR in 40' up to 50. Patient requires several minutes of rest to recover. Continue PT for mobility.  Follow Up Recommendations  Home health PT;Supervision - Intermittent     Equipment Recommendations  None recommended by PT    Recommendations for Other Services       Precautions / Restrictions Precautions Precautions: Fall Precaution Comments: monitor O2, on HFNC either heated or not, varies    Mobility  Bed Mobility               General bed mobility comments: sitting in recliner  Transfers                 General transfer comment: RN to speak top pt. did not get to stand  Ambulation/Gait                 Stairs             Wheelchair Mobility    Modified Rankin (Stroke Patients Only)       Balance                                            Cognition Arousal/Alertness: Awake/alert   Overall Cognitive Status: Within Functional Limits for tasks assessed                                 General Comments: less anxious, long rest breaks between simple activities      Exercises General Exercises - Upper Extremity Shoulder Flexion: Strengthening;Both;10 reps;Seated;Theraband Theraband Level (Shoulder Flexion): Level 1 (Yellow) General Exercises - Lower Extremity Long Arc Quad: AROM;Both;10 reps Heel Slides: AROM;Both;10 reps Straight Leg Raises: AROM;Both;10 reps    General Comments        Pertinent Vitals/Pain Pain Assessment:  No/denies pain    Home Living                      Prior Function            PT Goals (current goals can now be found in the care plan section) Progress towards PT goals: Progressing toward goals    Frequency    Min 3X/week      PT Plan Current plan remains appropriate    Co-evaluation              AM-PAC PT "6 Clicks" Mobility   Outcome Measure  Help needed turning from your back to your side while in a flat bed without using bedrails?: A Little Help needed moving from lying on your back to sitting on the side of a flat bed without using bedrails?: A Little Help needed moving to and from a bed to a chair (including a wheelchair)?: A Little Help needed standing up from a chair using your arms (e.g., wheelchair or bedside chair)?: A  Little Help needed to walk in hospital room?: A Lot Help needed climbing 3-5 steps with a railing? : A Lot 6 Click Score: 16    End of Session Equipment Utilized During Treatment: Oxygen(Heated HFNC)   Patient left: in chair;with call bell/phone within reach;with nursing/sitter in room Nurse Communication: Mobility status PT Visit Diagnosis: Other abnormalities of gait and mobility (R26.89)     Time: 4417-1278 PT Time Calculation (min) (ACUTE ONLY): 34 min  Charges:  $Therapeutic Exercise: 23-37 mins                     Tresa Endo PT Acute Rehabilitation Services  Office [phone]    Claretha Cooper 02/26/2019, 3:59 PM

## 2019-02-26 NOTE — Progress Notes (Signed)
NAME:  Clayton Sawyer, MRN:  [id number], DOB:  1941/01/02, LOS: 7 ADMISSION DATE:  02/18/2019, CONSULTATION DATE:  February 13 2019 REFERRING MD:  Dr. Marcille Blanco, CHIEF COMPLAINT:  Cough   Brief History   78 year old male admitted on February 13, 2019 in the setting of severe acute respiratory failure with hypoxemia from SARS-CoV-2.   Past Medical History  Coronary artery disease Diastolic heart failure Hypertension CVA in past Hyperlipidemia History of prostate cancer Obstructive sleep apnea  Significant Hospital Events   7/15 Admit, transfer to [facility] 7/16 enrolled in fibrinogen research drug  7/27 PCCM consulted again for worsening oxygenation, pneumothorax on right  Consults:  Pulmonary and critical care medicine  Procedures:    Significant Diagnostic Tests:    Micro Data:  7/10, 7/15 SARS-CoV-2 positive 7/16 blood > coag neg staph in 1/4  Antimicrobials:  Ceftriaxone July 14 Azithromycin July 15 Remdesivir July 15 > July 19 Actemra July 15, July 19 Vancomycin July 18, July 19 Solumedrol July 14 Pamrevlumab vs placebo, next scheduled infusion July 29  Interim history/subjective:  Called back to see him for increased work of breathing Hemoptysis Increased shortness of breath Worsening hypoxemia   Objective   Blood pressure 120/72, pulse 91, temperature (!) 97.5 F (36.4 C), temperature source Axillary, resp. rate (!) 52, height 5\' 9"  (1.753 m), weight 105.7 kg, SpO2 (!) 86 %.    FiO2 (%):  [86 %-100 %] 100 %   Intake/Output Summary (Last 24 hours) at 02/26/2019 1415 Last data filed at 02/26/2019 1006 Gross per 24 hour  Intake 863 ml  Output 650 ml  Net 213 ml   Filed Weights   02/15/2019 2152  Weight: 105.7 kg    Examination:  General: increased work of breathing, anxious, sitting up in chair HENT: NCAT OP clear  PULM: Crackles bases B, increased respiratory effort CV: Tachy, regular, no mgr GI: BS+, soft, nontender MSK: normal bulk and tone Neuro:  awake, alert, no distress, MAEW  July 28 CXR images indepedently reviewed showing a small pneumothorax in the right lung, multi-focal airspace disease bilaterally   ABG (on 80% HHF)    Component Value Date/Time   PHART 7.413 02/26/2019 1403   PCO2ART 43.2 02/26/2019 1403   PO2ART 39.0 (LL) 02/26/2019 1403   HCO3 27.7 02/26/2019 1403   TCO2 29 02/26/2019 1403   O2SAT 76.0 02/26/2019 [facility] Problem list     Assessment & Plan:  Severe acute respiratory failure with hypoxemia in setting of COVID-19 pneumonia:  Oxygenation has worsened today Work of breathing worse Now with new right sided pneumothorax  I explained to Clayton Sawyer and his family (I have spoken to his wife Enid Derry, his son Coralyn Mark, and his granddaughter Apolonio Schneiders) that his condition has worsened.  I explained that in order to perform a chest tube he would need to go to the ICU for sedation which in his situation would be very dangerous.  He would require intubation and mechanical ventilation in order to accomplish this.  Further based on his advanced age and the worsening nature of his illness the likelihood of coming off of the ventilator would be very low.  He told me repeatedly that he doesn't want to go to the ICU and he doesn't want to be sedated.  I explained that an option is to continue the current level of care in his room and not escalate care and make him comfortable in the event of more suffering with the understanding  that he will die.    He is struggling with this decision and there is dissent in the family in that his wife and son want him to make the decision but his granddaughter feels that he should go on life support.  They are currently conferencing about this situation and I will be in contact with them soon to discuss further. I explained that if he wants to go to the ICU and go on life support I will do that though I don't think that it will be effective.    For now continue heated high flow  Don't move to ICU Continue morphine/ativan for comfort  Best practice:  Diet: regular diet Pain/Anxiety/Delirium protocol (if indicated): n/a VAP protocol (if indicated): na DVT prophylaxis: lovenox GI prophylaxis: pantoprazole Glucose control: SSI Mobility: bed rest Code Status: full for now> family discussing Family Communication:  See my note above Disposition: move to PCU today  Labs   CBC: Recent Labs  Lab 02/20/19 0500 02/21/19 0611 02/24/19 0137 02/24/19 0555 02/26/19 0520 02/26/19 1403  WBC 17.5* 18.4*  --  17.5* 23.3*  --   NEUTROABS  --  16.1*  --   --   --   --   HGB 16.3 15.8 18.0* 17.1* 16.9 18.0*  HCT 50.4 48.9 53.0* 51.5 51.7 53.0*  MCV 91.1 91.9  --  91.8 93.7  --   PLT 233 206  --  147* 120*  --     Basic Metabolic Panel: Recent Labs  Lab 02/21/19 0611 02/22/19 0500 02/23/19 0529 02/24/19 0137 02/24/19 0555 02/26/19 0520 02/26/19 1403  NA 136 137 140 134* 139 138 133*  K 5.1 4.3 4.9 4.2 4.5 5.0 4.9  CL 100 99 103  --  98 99  --   CO2 28 28 28   --  30 29  --   GLUCOSE 144* 104* 106*  --  109* 100*  --   BUN 34* 36* 29*  --  33* 30*  --   CREATININE 1.06 1.17 0.97  --  1.14 1.11  --   CALCIUM 8.2* 8.1* 8.4*  --  8.6* 8.4*  --   MG  --   --   --   --  2.4  --   --    GFR: Estimated Creatinine Clearance: 65.7 mL/min (by C-G formula based on SCr of 1.11 mg/dL). Recent Labs  Lab 02/20/19 0500 02/21/19 0611 02/24/19 0555 02/26/19 0520  PROCALCITON  --   --  <0.10  --   WBC 17.5* 18.4* 17.5* 23.3*    Liver Function Tests: Recent Labs  Lab 02/20/19 0500 02/21/19 0611 02/22/19 0500 02/23/19 0529 02/24/19 0555  AST 36 32 36 37 44*  ALT 50* 47* 49* 57* 70*  ALKPHOS 68 61 55 65 65  BILITOT 1.3* 1.2 1.4* 1.5* 1.8*  PROT 5.7* 5.6* 5.2* 5.5* 5.8*  ALBUMIN 3.1* 3.0* 2.9* 3.1* 3.3*   No results for input(s): LIPASE, AMYLASE in the last 168 hours. No results for input(s): AMMONIA in the last 168 hours.  ABG    Component Value  Date/Time   PHART 7.413 02/26/2019 1403   PCO2ART 43.2 02/26/2019 1403   PO2ART 39.0 (LL) 02/26/2019 1403   HCO3 27.7 02/26/2019 1403   TCO2 29 02/26/2019 1403   O2SAT 76.0 02/26/2019 1403     Coagulation Profile: No results for input(s): INR, PROTIME in the last 168 hours.  Cardiac Enzymes: No results for input(s): CKTOTAL, CKMB, CKMBINDEX, TROPONINI in the last 168  hours.  HbA1C: Hemoglobin A1C  Date/Time Value Ref Range Status  09/22/2012 02:49 AM 5.9 4.2 - 6.3 % Final    Comment:    The American Diabetes Association recommends that a primary goal of therapy should be <7% and that physicians should reevaluate the treatment regimen in patients with HbA1c values consistently >8%.     CBG: Recent Labs  Lab 02/25/19 1113 02/25/19 1649 02/25/19 2045 02/26/19 0914 02/26/19 1251  GLUCAP 182* 161* 136* 125* 166*     Critical care time: 90 minutes     Roselie Awkward, MD [place] Pager: [phone] Cell: [phone] If no response, call [phone]

## 2019-02-26 NOTE — Progress Notes (Signed)
Called and updated pt wife, Enid Derry.

## 2019-02-26 NOTE — Procedures (Signed)
Chest Tube Insertion Procedure Note  Indications:  Clinically significant Pneumothorax  Pre-operative Diagnosis: Pneumothorax  Post-operative Diagnosis: Pneumothorax  Procedure Details  Informed consent was obtained for the procedure, including sedation.  Risks of lung perforation, hemorrhage, arrhythmia, and adverse drug reaction were discussed.   After sterile skin prep, using standard technique, a 14 French tube was placed in the right lateral 9th rib space.  Findings: Small rush of air  Estimated Blood Loss:  less than 50 mL         Specimens:  None              Complications:  None; patient tolerated the procedure well.         Disposition: ICU - intubated and critically ill.         Condition: stable  Attending Attestation: I performed the procedure.  Roselie Awkward, MD [place] [facility] Pager: [phone] Cell: [phone] If no response, call [phone]

## 2019-02-26 NOTE — Progress Notes (Signed)
[facility] P[facility]M  Lengthy conversations with family and the patient: they have decided they want full code, full resuscitive efforts.  Plan transfer to ICU for intubation and chest tube.  Roselie Awkward, MD [place] [facility] Pager: [phone] Cell: [phone] If no response, call [phone]

## 2019-02-26 NOTE — Procedures (Signed)
Intubation Procedure Note Clayton Sawyer [id number] Feb 28, 1941  Procedure: Intubation Indications: Airway protection and maintenance  Procedure Details Consent: Risks of procedure as well as the alternatives and risks of each were explained to the (patient/caregiver).  Consent for procedure obtained. Time Out: Verified patient identification, verified procedure, site/side was marked, verified correct patient position, special equipment/implants available, medications/allergies/relevent history reviewed, required imaging and test results available.  Performed  Drugs Etomidate 20m, versed 2110mfentanyl 5064m rocuronium 100m36mDL x 1 with MAC 4 blade Grade 1 view 7.5 ET tube passed through cords under direct visualization Placement confirmed with bilateral breath sounds, positive EtCO2 change and smoke in tube   Evaluation Hemodynamic Status: BP stable throughout; O2 sats: transiently fell during during procedure Patient's Current Condition: stable Complications: No apparent complications Patient did tolerate procedure well. Chest X-ray ordered to verify placement.  CXR: pending.   DougSimonne Maffucci8/2020

## 2019-02-26 NOTE — Progress Notes (Signed)
At bedside shift report with Nurse Brayton Layman, patient reported, "I think my family will decide what we should do in the morning."  Nursing communicated to patient that Dr. Lake Bells has been in touch with his family and that there is no reason to delay communicating patient's wishes to the doctor.  Patient reports he is afraid of the breathing tube, but wants to try everything to get better and says "I know people who have survived this." Patient says, "I understand I have a collapsed lung" and reports to nursing that if the doctor thinks he needs a chest tube, he wants to do it.  Agricultural consultant and MD notified.

## 2019-02-26 NOTE — Progress Notes (Signed)
RT called to access patient due to desat and AMS.  ABG ordred. RN placed patient on Heated HFNC 90%, 25 L. Patient talking to RN when RT arrived.  sats improved to 86% and patient appears alert and comfortable.  Critical ABG values given to Dr. Lake Bells.

## 2019-02-26 NOTE — Progress Notes (Signed)
RN at bedside check in on patient.  Patient remains in chair.  Patient arousable to voice, but drowsiness compared to this morning.  Patient with visible tachypnea and increased work of breathing.  Patient reports, "[name], I'm just tired, just worn out."  Spo2 reads 80%,  RR 44.  Sensor repositioned with unchanged reading. Charge RN updated and respiratory called for re-assessment and recommendations.     02/26/19 1345  Vitals  Pulse Rate (!) 104  ECG Heart Rate (!) 104  Cardiac Rhythm ST  Resp (!) 44  Oxygen Therapy  SpO2 (!) 80 %  O2 Device HFNC  O2 Flow Rate (L/min) 15 L/min  MEWS Score  MEWS RR 3  MEWS Pulse 1  MEWS Systolic 0  MEWS LOC 0  MEWS Temp 0  MEWS Score 4  MEWS Score Color Red

## 2019-02-26 NOTE — Progress Notes (Signed)
Received phone call from patient's granddaughter Apolonio Schneiders.  She reports she recently received a phone call from "the doctor" updating her that her grandfather was now a DNR.  Granddaughter verbalizes that she is very upset about this, and feels this is a family decision.  Granddaughter asking RN to go back into room and ask her grandfather what his wishes are.  Patient was reserved in answering nurses questions regarding his wishes pertaining to his code status, and reported, "I just feel there is a lot of negative." Patient ultimately said that he would like everything to be done to help him live and requested NOT to be a DNR.     Nursing empathetically listened to all of granddaughters concerns and comments. Nurse felt it important to communicate to both the patient at bedside and the granddaughter via telephone that being the patient is of sound mind, his 1-1 patient to physician conversation with Dr. Lake Bells was NOT out of line, but an important conversation, including this nurse, and two respiratory therapists.  Including discussion of code status, patient condition, and the patient's wishes-- this conversation was not at all a conversation intended to bypass the family. The granddaughter remained frustrated, reiterating that if things were to worsen and patient would need to be transferred back to the ICU and he would require the ventilator to live, the family's wish is that everything be done.     Nursing kindly and professionally summarized phone call and lastly reported to granddaughter and patient that the patient is to remain a full code.

## 2019-02-26 NOTE — Progress Notes (Signed)
Critical ABG values reported to Elink,.  No changes at this time.  ph 7.19 C02 86 Po2 93 HC03 33

## 2019-02-26 NOTE — Progress Notes (Signed)
[facility]  Moved to ICU Intubated, sedated chest tube placed Vent/sedation orders in place Tube feeding orders in Chest tube orders in Family updated  Roselie Awkward, MD [place] [facility] Pager: [phone] Cell: [phone] If no response, call [phone]

## 2019-02-27 DIAGNOSIS — R739 Hyperglycemia, unspecified: Secondary | ICD-10-CM

## 2019-02-27 DIAGNOSIS — Z9911 Dependence on respirator [ventilator] status: Secondary | ICD-10-CM

## 2019-02-27 DIAGNOSIS — I10 Essential (primary) hypertension: Secondary | ICD-10-CM

## 2019-02-27 DIAGNOSIS — Z950 Presence of cardiac pacemaker: Secondary | ICD-10-CM

## 2019-02-27 DIAGNOSIS — J9601 Acute respiratory failure with hypoxia: Secondary | ICD-10-CM

## 2019-02-27 DIAGNOSIS — E875 Hyperkalemia: Secondary | ICD-10-CM

## 2019-02-27 DIAGNOSIS — J9312 Secondary spontaneous pneumothorax: Secondary | ICD-10-CM

## 2019-02-27 LAB — BASIC METABOLIC PANEL
Anion gap: 9 (ref 5–15)
Anion gap: 7 (ref 5–15)
BUN: 33 mg/dL — ABNORMAL HIGH (ref 8–23)
BUN: 35 mg/dL — ABNORMAL HIGH (ref 8–23)
CO2: 29 mmol/L (ref 22–32)
CO2: 30 mmol/L (ref 22–32)
Calcium: 7.4 mg/dL — ABNORMAL LOW (ref 8.9–10.3)
Calcium: 8 mg/dL — ABNORMAL LOW (ref 8.9–10.3)
Chloride: 102 mmol/L (ref 98–111)
Chloride: 101 mmol/L (ref 98–111)
Creatinine, Ser: 1.51 mg/dL — ABNORMAL HIGH (ref 0.61–1.24)
Creatinine, Ser: 1.32 mg/dL — ABNORMAL HIGH (ref 0.61–1.24)
GFR calc Af Amer: 51 mL/min — ABNORMAL LOW (ref >60)
GFR calc Af Amer: 59 mL/min — ABNORMAL LOW (ref >60)
GFR calc non Af Amer: 44 mL/min — ABNORMAL LOW (ref >60)
GFR calc non Af Amer: 51 mL/min — ABNORMAL LOW (ref >60)
Glucose, Bld: 132 mg/dL — ABNORMAL HIGH (ref 70–99)
Glucose, Bld: 209 mg/dL — ABNORMAL HIGH (ref 70–99)
Potassium: 5.7 mmol/L — ABNORMAL HIGH (ref 3.5–5.1)
Potassium: 6 mmol/L — ABNORMAL HIGH (ref 3.5–5.1)
Sodium: 140 mmol/L (ref 135–145)
Sodium: 138 mmol/L (ref 135–145)

## 2019-02-27 LAB — POCT I-STAT 7, (LYTES, BLD GAS, ICA,H+H)
Acid-Base Excess: 1 mmol/L (ref 0.0–2.0)
Acid-Base Excess: 3 mmol/L — ABNORMAL HIGH (ref 0.0–2.0)
Bicarbonate: 32.1 mmol/L — ABNORMAL HIGH (ref 20.0–28.0)
Bicarbonate: 34.9 mmol/L — ABNORMAL HIGH (ref 20.0–28.0)
Calcium, Ion: 1.22 mmol/L (ref 1.15–1.40)
Calcium, Ion: 1.22 mmol/L (ref 1.15–1.40)
HCT: 52 % (ref 39.0–52.0)
HCT: 51 % (ref 39.0–52.0)
Hemoglobin: 17.7 g/dL — ABNORMAL HIGH (ref 13.0–17.0)
Hemoglobin: 17.3 g/dL — ABNORMAL HIGH (ref 13.0–17.0)
O2 Saturation: 91 %
O2 Saturation: 95 %
Patient temperature: 98.7
Patient temperature: 99.4
Potassium: 5.5 mmol/L — ABNORMAL HIGH (ref 3.5–5.1)
Potassium: 6.3 mmol/L (ref 3.5–5.1)
Sodium: 135 mmol/L (ref 135–145)
Sodium: 133 mmol/L — ABNORMAL LOW (ref 135–145)
TCO2: 34 mmol/L — ABNORMAL HIGH (ref 22–32)
TCO2: 37 mmol/L — ABNORMAL HIGH (ref 22–32)
pCO2 arterial: 77.8 mmHg (ref 32.0–48.0)
pCO2 arterial: 87 mmHg (ref 32.0–48.0)
pH, Arterial: 7.224 — ABNORMAL LOW (ref 7.350–7.450)
pH, Arterial: 7.213 — ABNORMAL LOW (ref 7.350–7.450)
pO2, Arterial: 77 mmHg — ABNORMAL LOW (ref 83.0–108.0)
pO2, Arterial: 95 mmHg (ref 83.0–108.0)

## 2019-02-27 LAB — CBC
HCT: 49.9 % (ref 39.0–52.0)
Hemoglobin: 15.3 g/dL (ref 13.0–17.0)
MCH: 29.9 pg (ref 26.0–34.0)
MCHC: 30.7 g/dL (ref 30.0–36.0)
MCV: 97.7 fL (ref 80.0–100.0)
Platelets: 108 10*3/uL — ABNORMAL LOW (ref 150–400)
RBC: 5.11 MIL/uL (ref 4.22–5.81)
RDW: 15.9 % — ABNORMAL HIGH (ref 11.5–15.5)
WBC: 33.2 10*3/uL — ABNORMAL HIGH (ref 4.0–10.5)
nRBC: 0 % (ref 0.0–0.2)

## 2019-02-27 LAB — GLUCOSE, CAPILLARY
Glucose-Capillary: 151 mg/dL — ABNORMAL HIGH (ref 70–99)
Glucose-Capillary: 176 mg/dL — ABNORMAL HIGH (ref 70–99)
Glucose-Capillary: 198 mg/dL — ABNORMAL HIGH (ref 70–99)
Glucose-Capillary: 173 mg/dL — ABNORMAL HIGH (ref 70–99)
Glucose-Capillary: 127 mg/dL — ABNORMAL HIGH (ref 70–99)

## 2019-02-27 LAB — D-DIMER, QUANTITATIVE: D-Dimer, Quant: 1.61 ug/mL-FEU — ABNORMAL HIGH (ref 0.00–0.50)

## 2019-02-27 LAB — PHOSPHORUS: Phosphorus: 6.5 mg/dL — ABNORMAL HIGH (ref 2.5–4.6)

## 2019-02-27 LAB — FERRITIN: Ferritin: 179 ng/mL (ref 24–336)

## 2019-02-27 LAB — MAGNESIUM: Magnesium: 2.3 mg/dL (ref 1.7–2.4)

## 2019-02-27 LAB — C-REACTIVE PROTEIN: CRP: <0.8 mg/dL (ref <1.0)

## 2019-02-27 MED ORDER — ROCURONIUM BROMIDE 50 MG/5ML IV SOLN
50.0000 mg | Freq: Once | INTRAVENOUS | Status: AC
  Administered 2019-02-27: 50 mg via INTRAVENOUS
  Filled 2019-02-27 (×2): qty 5

## 2019-02-27 MED ORDER — ROCURONIUM BROMIDE 50 MG/5ML IV SOLN
100.0000 mg | Freq: Once | INTRAVENOUS | Status: AC
  Administered 2019-02-27: 100 mg via INTRAVENOUS
  Filled 2019-02-27: qty 10

## 2019-02-27 MED ORDER — ALBUMIN HUMAN 25 % IV SOLN
25.0000 g | Freq: Once | INTRAVENOUS | Status: AC
  Administered 2019-02-27: 25 g via INTRAVENOUS
  Filled 2019-02-27: qty 50

## 2019-02-27 MED ORDER — SODIUM ZIRCONIUM CYCLOSILICATE 10 G PO PACK
10.0000 g | PACK | Freq: Once | ORAL | Status: AC
  Administered 2019-02-27: 10 g via ORAL
  Filled 2019-02-27: qty 1

## 2019-02-27 MED ORDER — VITAL HIGH PROTEIN PO LIQD
1000.0000 mL | ORAL | Status: DC
  Administered 2019-02-28 – 2019-03-04 (×5): 1000 mL

## 2019-02-27 MED ORDER — CLOPIDOGREL BISULFATE 75 MG PO TABS
75.0000 mg | ORAL_TABLET | Freq: Every day | ORAL | Status: DC
  Administered 2019-02-28 – 2019-03-04 (×5): 75 mg
  Filled 2019-02-27 (×5): qty 1

## 2019-02-27 MED ORDER — ROCURONIUM BROMIDE 10 MG/ML (PF) SYRINGE
PREFILLED_SYRINGE | INTRAVENOUS | Status: AC
  Administered 2019-02-27: 100 mg
  Filled 2019-02-27: qty 10

## 2019-02-27 MED ORDER — MIDAZOLAM HCL 2 MG/2ML IJ SOLN
INTRAMUSCULAR | Status: AC
  Administered 2019-02-27: 2 mg via INTRAVENOUS
  Filled 2019-02-27: qty 2

## 2019-02-27 MED ORDER — DEXTROSE 10 % IV SOLN: INTRAVENOUS | Status: DC

## 2019-02-27 MED ORDER — INSULIN ASPART 100 UNIT/ML ~~LOC~~ SOLN
0.0000 [IU] | SUBCUTANEOUS | Status: DC
  Administered 2019-02-27 (×2): 4 [IU] via SUBCUTANEOUS
  Administered 2019-02-28: 01:00:00 3 [IU] via SUBCUTANEOUS
  Administered 2019-02-28 (×5): 4 [IU] via SUBCUTANEOUS
  Administered 2019-03-01 (×2): 3 [IU] via SUBCUTANEOUS
  Administered 2019-03-01 (×3): 4 [IU] via SUBCUTANEOUS
  Administered 2019-03-01: 3 [IU] via SUBCUTANEOUS
  Administered 2019-03-02 (×2): 4 [IU] via SUBCUTANEOUS
  Administered 2019-03-02 – 2019-03-03 (×6): 3 [IU] via SUBCUTANEOUS
  Administered 2019-03-03: 4 [IU] via SUBCUTANEOUS
  Administered 2019-03-03 (×3): 3 [IU] via SUBCUTANEOUS
  Administered 2019-03-04 (×5): 4 [IU] via SUBCUTANEOUS
  Administered 2019-03-04: 08:00:00 3 [IU] via SUBCUTANEOUS
  Administered 2019-03-04: 7 [IU] via SUBCUTANEOUS
  Administered 2019-03-05: 13:00:00 3 [IU] via SUBCUTANEOUS
  Administered 2019-03-05: 16:00:00 4 [IU] via SUBCUTANEOUS
  Administered 2019-03-05 (×2): 3 [IU] via SUBCUTANEOUS
  Administered 2019-03-05 – 2019-03-06 (×3): 4 [IU] via SUBCUTANEOUS
  Administered 2019-03-06: 17:00:00 7 [IU] via SUBCUTANEOUS
  Administered 2019-03-06 (×2): 4 [IU] via SUBCUTANEOUS
  Administered 2019-03-06: 04:00:00 7 [IU] via SUBCUTANEOUS
  Administered 2019-03-07 (×5): 4 [IU] via SUBCUTANEOUS
  Administered 2019-03-07: 3 [IU] via SUBCUTANEOUS
  Administered 2019-03-07 – 2019-03-08 (×3): 4 [IU] via SUBCUTANEOUS
  Administered 2019-03-08 (×2): 7 [IU] via SUBCUTANEOUS
  Administered 2019-03-08 – 2019-03-09 (×7): 4 [IU] via SUBCUTANEOUS
  Administered 2019-03-09: 7 [IU] via SUBCUTANEOUS
  Administered 2019-03-10 (×4): 4 [IU] via SUBCUTANEOUS
  Administered 2019-03-10 (×2): 3 [IU] via SUBCUTANEOUS
  Administered 2019-03-11 (×2): 4 [IU] via SUBCUTANEOUS
  Administered 2019-03-11: 3 [IU] via SUBCUTANEOUS
  Administered 2019-03-12: 20:00:00 4 [IU] via SUBCUTANEOUS
  Administered 2019-03-12: 12:00:00 7 [IU] via SUBCUTANEOUS
  Administered 2019-03-12: 3 [IU] via SUBCUTANEOUS
  Administered 2019-03-12: 09:00:00 7 [IU] via SUBCUTANEOUS
  Administered 2019-03-12 – 2019-03-13 (×5): 3 [IU] via SUBCUTANEOUS
  Administered 2019-03-13: 4 [IU] via SUBCUTANEOUS
  Administered 2019-03-13: 3 [IU] via SUBCUTANEOUS
  Administered 2019-03-14: 4 [IU] via SUBCUTANEOUS
  Administered 2019-03-14: 7 [IU] via SUBCUTANEOUS
  Administered 2019-03-15: 3 [IU] via SUBCUTANEOUS
  Administered 2019-03-15 (×2): 4 [IU] via SUBCUTANEOUS
  Administered 2019-03-16 (×2): 3 [IU] via SUBCUTANEOUS

## 2019-02-27 MED ORDER — NOREPINEPHRINE 4 MG/250ML-% IV SOLN
0.0000 ug/min | INTRAVENOUS | Status: DC
  Administered 2019-02-27: 6 ug/min via INTRAVENOUS
  Administered 2019-02-27: 10 ug/min via INTRAVENOUS
  Administered 2019-02-27: 5 ug/min via INTRAVENOUS
  Administered 2019-02-28: 10:00:00 2 ug/min via INTRAVENOUS
  Filled 2019-02-27 (×3): qty 250

## 2019-02-27 MED ORDER — PRO-STAT SUGAR FREE PO LIQD
30.0000 mL | Freq: Four times a day (QID) | ORAL | Status: DC
  Administered 2019-02-27 – 2019-03-05 (×24): 30 mL
  Filled 2019-02-27 (×23): qty 30

## 2019-02-27 MED ORDER — MIDAZOLAM 50MG/50ML (1MG/ML) PREMIX INFUSION
1.0000 mg/h | INTRAVENOUS | Status: DC
  Administered 2019-02-27 – 2019-03-03 (×7): 2 mg/h via INTRAVENOUS
  Administered 2019-03-04: 2.5 mg/h via INTRAVENOUS
  Administered 2019-03-05: 3 mg/h via INTRAVENOUS
  Administered 2019-03-05: 2 mg/h via INTRAVENOUS
  Administered 2019-03-05: 3 mg/h via INTRAVENOUS
  Filled 2019-02-27 (×12): qty 50

## 2019-02-27 MED ORDER — PANTOPRAZOLE SODIUM 40 MG PO PACK
40.0000 mg | PACK | Freq: Every day | ORAL | Status: DC
  Administered 2019-02-28 – 2019-03-15 (×16): 40 mg
  Filled 2019-02-27 (×19): qty 20

## 2019-02-27 MED ORDER — MIDAZOLAM HCL 2 MG/2ML IJ SOLN
2.0000 mg | Freq: Once | INTRAMUSCULAR | Status: AC
  Administered 2019-02-27: 01:00:00 2 mg via INTRAVENOUS

## 2019-02-27 MED ORDER — POLYETHYLENE GLYCOL 3350 17 G PO PACK
17.0000 g | PACK | Freq: Two times a day (BID) | ORAL | Status: DC
  Administered 2019-02-27 – 2019-03-16 (×28): 17 g
  Filled 2019-02-27 (×30): qty 1

## 2019-02-27 MED ORDER — INSULIN ASPART 100 UNIT/ML ~~LOC~~ SOLN
2.0000 [IU] | SUBCUTANEOUS | Status: DC
  Administered 2019-02-27 – 2019-03-06 (×41): 2 [IU] via SUBCUTANEOUS

## 2019-02-27 MED ORDER — NOREPINEPHRINE 4 MG/250ML-% IV SOLN
INTRAVENOUS | Status: AC
  Administered 2019-02-27: 5 ug/min via INTRAVENOUS
  Filled 2019-02-27: qty 250

## 2019-02-27 NOTE — Progress Notes (Signed)
 Initial Nutrition Assessment   RD working remotely.   DOCUMENTATION CODES:   Obesity unspecified  INTERVENTION:   Tube Feeding:  Vital High Protein at 45 ml/hr Pro-Stat 30 mL QID Provides 1480 kcals, 155 g of protein and 907 mL of free water Meets 100% estimated calorie and protein needs Monitor renal function, electrolytes and adjust TF as necessary   NUTRITION DIAGNOSIS:   Inadequate oral intake related to acute illness as evidenced by NPO status.  GOAL:   Patient will meet greater than or equal to 90% of their needs  MONITOR:   TF tolerance, Vent status, Skin, Weight trends, Labs  REASON FOR ASSESSMENT:   Consult, Ventilator Enteral/tube feeding initiation and management  ASSESSMENT:   78 yo male admitted on 7/15 with acute respiratory failure due to COVID-19 virus, pt with decline in respiratory status and pneumothorax requring transfer to ICU and intubation on 7/28.  PMH includes stroke, HTN, CAD   7/15 Admit 7/27 R. Pneumothorax 7/28 Intubated, Chest tube placed  Patient is currently intubated on ventilator support,vent dysnchrony,  fentanyl, versed and precedex drips for sedatoin, on levophed MV: 16.3 L/min Temp (24hrs), Avg:98.5 F (36.9 C), Min:97.5 F (36.4 C), Max:99.4 F (37.4 C)  Propofol: NONE  Per chart review, pt eating 25-75% of meals prior to intubation. Noted diet downgraded to CL starting on 6/26  Vital High Protein infusing at 40 ml/hr Abdominal xray with OG tube in gastric fundus  Admit weight 105.7 kg; today's weight 101.6 kg Net negative 5 L per I/O flow sheet. Pt with non-pitind   Labs: potassium 5.7 (H), Creatinine 1.51, BUN 33, phosphorus 6.5 (H) Meds: Vitamin C, Zinc sulfate   NUTRITION - FOCUSED PHYSICAL EXAM:  Unable to assess  Diet Order:   Diet Order            Diet clear liquid Room service appropriate? Yes; Fluid consistency: Thin  Diet effective now              EDUCATION NEEDS:   Not appropriate for  education at this time  Skin:  Skin Assessment: Reviewed RN Assessment  Last BM:  7/28  Height:   Ht Readings from Last 1 Encounters:  02/26/19 5\' 9"  (1.753 m)    Weight:   Wt Readings from Last 1 Encounters:  02/27/19 101.6 kg    Ideal Body Weight:     BMI:  Body mass index is 33.06 kg/m.  Estimated Nutritional Needs:   Kcal:  1320-1540 kcals  Protein:  145-180 g  Fluid:  >/= 1.8 L    Kinta Martis MS, RDN, LDN, CNSC [phone] Pager  [phone] Weekend/On-Call Pager

## 2019-02-27 NOTE — Progress Notes (Addendum)
NAME:  Clayton Sawyer, MRN:  [id number], DOB:  23-Feb-1941, LOS: 51 ADMISSION DATE:  03/01/2019, CONSULTATION DATE:  February 13 2019 REFERRING MD:  Dr. Marcille Blanco, CHIEF COMPLAINT:  Cough   Brief History   78 year old male admitted on February 13, 2019 in the setting of severe acute respiratory failure with hypoxemia from SARS-CoV-2.  Past Medical History  Coronary artery disease Diastolic heart failure Hypertension CVA in past Hyperlipidemia History of prostate cancer Obstructive sleep apnea  Significant Hospital Events   7/15 Admit, transfer to [facility] 7/16 enrolled in fibrinogen research drug  7/27 PCCM consulted again for worsening oxygenation, pneumothorax on right 7/28 transferred to the intensive care unit, full code, intubated, right-sided chest tube  Consults:  Pulmonary and critical care medicine  Procedures:  ETT 7/28 CVC 7/28 Pigtail R 7/28  Significant Diagnostic Tests:    Micro Data:  7/10, 7/15 SARS-CoV-2 positive 7/16 blood > coag neg staph in 1/4  Antimicrobials:  Ceftriaxone July 14 Azithromycin July 15 Remdesivir July 15 > July 19 Actemra July 15, July 19 Vancomycin July 18, July 19 Solumedrol July 14 Pamrevlumab vs placebo, next scheduled infusion July 29  Interim history/subjective:   Patient ultimately along with family made the decision to pursue full care measures yesterday.  Patient's respiratory failure required intubation and mechanical ventilation transferred to the intensive care unit.  Also being placed on positive pressure with a right-sided pneumothorax pre-existing the decision was made for a 14 French pigtail catheter placement.  Patient remains in the intensive care unit on mechanical life support.  Objective   Blood pressure 116/67, pulse 83, temperature 99.4 F (37.4 C), temperature source Oral, resp. rate (!) 35, height 5\' 9"  (1.753 m), weight 101.6 kg, SpO2 94 %. CVP:  [8 mmHg-12 mmHg] 8 mmHg  Vent Mode: PRVC FiO2 (%):  [80 %-100 %] 80  % Set Rate:  [26 bmp-32 bmp] 32 bmp Vt Set:  [420 mL] 420 mL PEEP:  [12 cmH20] 12 cmH20 Plateau Pressure:  [32 cmH20-37 cmH20] 35 cmH20   Intake/Output Summary (Last 24 hours) at 02/27/2019 0842 Last data filed at 02/27/2019 0700 Gross per 24 hour  Intake 1416.59 ml  Output 1110 ml  Net 306.59 ml   Filed Weights   02/18/2019 2152 02/27/19 0438  Weight: 105.7 kg 101.6 kg    Examination:  General: Intubated sedated not responsive to pain HENT: NCAT, trachea midline PULM: Bilateral ventilated breath sounds CV: Regular rate and rhythm, S1-S2 GI: Bowel sounds present, soft nontender nondistended MSK: Normal bulk and tone Extremities: No significant edema Neuro: Intubated, sedated on mechanical ventilation  CXR: 02/26/2019 Imaging reviewed.  Endotracheal tube in place good position of right chest tube.  Resolution of pneumothorax.  Patient still has bilateral infiltrates. The patient's images have been independently reviewed by me.    Resolved Hospital Problem list     Assessment & Plan:   Severe acute hypoxemic respiratory failure in setting of COVID-19 pneumonia:  Right-sided pneumothorax status post 14 French pigtail placement Continue mechanical ventilation per ARDS protocol Target TVol 6-8cc/kgIBW Target Plateau Pressure < 30cm H20 Target driving pressure less than 15 cm of water Target PaO2 55-65: titrate PEEP/FiO2 per protocol As long as PaO2 to FiO2 ratio is less than 1:150 position in prone position for 16 hours a day Check CVP daily if CVL in place Target CVP less than 4, diurese as necessary Ventilator associated pneumonia prevention protocol  Chest tube remains on suction, no air leak present. Chest tube will remain  until off positive pressure mechanical ventilation.  Overall prognosis is poor in setting of age and progressive decompensation.  Sedation needs while on mechanical ventilation. Continue Precedex, Versed infusion and fentanyl infusion - Goal RA SS  -3 We will attempt to avoid any additional need for paralysis. If becomes more dyssynchronous can consider its use again  Shock, hypotension Likely secondary to sedation requirements. Maintain mean arterial pressure greater than 65 mmHg, titrating of norepinephrine.  History of cardiac pacemaker History of stroke CAD, status post PCI -Per [facility]  Hyperglycemia - Sliding scale, CBGs per [facility]  Best practice:  Diet: regular diet Pain/Anxiety/Delirium protocol (if indicated): n/a VAP protocol (if indicated): na DVT prophylaxis: lovenox GI prophylaxis: pantoprazole Glucose control: SSI Mobility: bed rest Code Status: Per TR[facility] Family Communication:  See my note above Disposition: move to PCU today  Labs   CBC: Recent Labs  Lab 02/21/19 0611  02/24/19 0555 02/26/19 0520 02/26/19 1403 02/26/19 2203 02/27/19 0101 02/27/19 0459 02/27/19 0542  WBC 18.4*  --  17.5* 23.3*  --   --   --   --  33.2*  NEUTROABS 16.1*  --   --   --   --   --   --   --   --   HGB 15.8   < > 17.1* 16.9 18.0* 19.4* 17.7* 17.3* 15.3  HCT 48.9   < > 51.5 51.7 53.0* 57.0* 52.0 51.0 49.9  MCV 91.9  --  91.8 93.7  --   --   --   --  97.7  PLT 206  --  147* 120*  --   --   --   --  108*   < > = values in this interval not displayed.    Basic Metabolic Panel: Recent Labs  Lab 02/21/19 0611 02/22/19 0500 02/23/19 0529  02/24/19 0555 02/26/19 0520 02/26/19 1403 02/26/19 1955 02/26/19 2203 02/27/19 0101 02/27/19 0459  NA 136 137 140   < > 139 138 133*  --  135 135 133*  K 5.1 4.3 4.9   < > 4.5 5.0 4.9  --  5.5* 5.5* 6.3*  CL 100 99 103  --  98 99  --   --   --   --   --   CO2 28 28 28   --  30 29  --   --   --   --   --   GLUCOSE 144* 104* 106*  --  109* 100*  --   --   --   --   --   BUN 34* 36* 29*  --  33* 30*  --   --   --   --   --   CREATININE 1.06 1.17 0.97  --  1.14 1.11  --   --   --   --   --   CALCIUM 8.2* 8.1* 8.4*  --  8.6* 8.4*  --   --   --   --   --   MG  --   --   --   --  2.4  --    --  2.5*  --   --   --   PHOS  --   --   --   --   --   --   --  3.6  --   --   --    < > = values in this interval not displayed.   GFR: Estimated Creatinine Clearance: 64.5 mL/min (by  C-G formula based on SCr of 1.11 mg/dL). Recent Labs  Lab 02/21/19 0611 02/24/19 0555 02/26/19 0520 02/26/19 1955 02/27/19 0542  PROCALCITON  --  <0.10  --  0.33  --   WBC 18.4* 17.5* 23.3*  --  33.2*    Liver Function Tests: Recent Labs  Lab 02/21/19 0611 02/22/19 0500 02/23/19 0529 02/24/19 0555  AST 32 36 37 44*  ALT 47* 49* 57* 70*  ALKPHOS 61 55 65 65  BILITOT 1.2 1.4* 1.5* 1.8*  PROT 5.6* 5.2* 5.5* 5.8*  ALBUMIN 3.0* 2.9* 3.1* 3.3*   No results for input(s): LIPASE, AMYLASE in the last 168 hours. No results for input(s): AMMONIA in the last 168 hours.  ABG    Component Value Date/Time   PHART 7.213 (L) 02/27/2019 0459   PCO2ART 87.0 (HH) 02/27/2019 0459   PO2ART 95.0 02/27/2019 0459   HCO3 34.9 (H) 02/27/2019 0459   TCO2 37 (H) 02/27/2019 0459   O2SAT 95.0 02/27/2019 0459     Coagulation Profile: No results for input(s): INR, PROTIME in the last 168 hours.  Cardiac Enzymes: No results for input(s): CKTOTAL, CKMB, CKMBINDEX, TROPONINI in the last 168 hours.  HbA1C: Hemoglobin A1C  Date/Time Value Ref Range Status  09/22/2012 02:49 AM 5.9 4.2 - 6.3 % Final    Comment:    The American Diabetes Association recommends that a primary goal of therapy should be <7% and that physicians should reevaluate the treatment regimen in patients with HbA1c values consistently >8%.     CBG: Recent Labs  Lab 02/25/19 2045 02/26/19 0914 02/26/19 1251 02/26/19 1619 02/26/19 2327  GLUCAP 136* 125* 166* 166* 240*    This patient is critically ill with multiple organ system failure; which, requires frequent high complexity decision making, assessment, support, evaluation, and titration of therapies. This was completed through the application of advanced monitoring technologies and  extensive interpretation of multiple databases. During this encounter critical care time was devoted to patient care services described in this note for 32 minutes.  Garner Nash, DO [facility] 02/27/2019 8:42 AM  Personal pager: [phone] If unanswered, please page CCM On-call: [phone]

## 2019-02-27 NOTE — Procedures (Signed)
Cortrak  Person Inserting Tube:  Clayton Sawyer, RD Tube Type:  Cortrak - 43 inches Tube Location:  Left nare Initial Placement:  Postpyloric Secured by: Bridle Technique Used to Measure Tube Placement:  Documented cm marking at nare/ corner of mouth Cortrak Secured At:  81 cm    Cortrak Tube Team Note:  Consult received to place a Cortrak feeding tube.   No x-ray is required. RN may begin using tube.   If the tube becomes dislodged please keep the tube and contact the Cortrak team at [email or web address] (password [email or web address]) for replacement.  If after hours and replacement cannot be delayed, place a NG tube and confirm placement with an abdominal x-ray.    [place], [place], [place] Pager [phone] After Hours Pager

## 2019-02-27 NOTE — Progress Notes (Signed)
eLink Physician-Brief Progress Note Patient Name: Clayton Sawyer DOB: [birth year] MRN: [id number]   Date of Service  02/27/2019  HPI/Events of Note  Pt continues to be dyssynchronous on the ventilator  eICU Interventions  Fentanyl ceiling raised to 400  Mcg, Rocuronium 50 mg iv x 1         [place]wo U Ogan 02/27/2019, 3:41 AM

## 2019-02-27 NOTE — Progress Notes (Signed)
OT Cancellation Note  Patient Details Name: Clayton Sawyer MRN: [id number] DOB: 10/24/1940   Cancelled Treatment:    Reason Eval/Treat Not Completed: Other (comment)(Pt intubated and sedated. Will return as schedule allows. )  [place], OTR/L Acute Rehab Pager: [phone] Office: [phone] 02/27/2019, 7:20 AM

## 2019-02-27 NOTE — Progress Notes (Signed)
[facility] Progress Note Patient Name: KAIN MILOSEVIC DOB: 02/07/1941 MRN: [id number]   Date of Service  02/27/2019  HPI/Events of Note  Pt agitated and blood pressure is soft. Based on his serum hemoglobin he appears to be over diuresed. Last ABG significant for hypercapnia and respiratory acidosis which may be impacting patient's blood pressure.  eICU Interventions  Albumin 25 % 25 gm iv bolus x 1, begin Norepinephrine and Versed infusions, Versed 2 mg iv x 1 just before dose of Rocuronium 100 mg iv x 1, ABG to assess acid-base status and oxygenation.        Kerry Kass Ogan 02/27/2019, 12:41 AM

## 2019-02-27 NOTE — Research (Signed)
Title: [id number] (FibroGen Study) Randomized, Double-Blind, Placebo-Controlled Phase 2 Study of the Efficacy and Safety of Intravenous Pamrevlumab, a Monoclonal Antibody Against Connective Tissue Growth Factor (CTGF), in Hospitalized Patients with Acute COVID-19 Disease .  alized Patients with Acute COVID-19 Disease  Protocol #: FGCL-3019-098, Clinical Trials #: [id number] Sponsor: [email or web address]  ([place], [place], [place])  Scientist, physiological / Electrical engineer note : This visit for Subject Clayton Sawyer with DOB: 10/30/1940 on 02/27/2019 for the above protocol is Visit/Encounter # Day 14 infusion  and is for purpose of research . Subject was intubated and transferred to the ICU 7/28.  Spoke with his granddaughter, Greggory Stallion. She consented in continuing as a study subject.   All procedures completed per the above mentioned protocol. Refer to the subjects paper source binder for details of the visit. Subject tolerated infusion well, given over 1 hour. Next infusion is scheduled for March 06, 2019.   Signed by  Leverne Humbles, RN, MSN Research Nurse [employer]

## 2019-02-27 NOTE — Progress Notes (Addendum)
[facility] Progress Note Patient Name: Clayton Sawyer DOB: 03-12-1941 MRN: [id number]   Date of Service  02/27/2019  HPI/Events of Note  Respiratory acidosis  eICU Interventions  RR increased from 26 per minute to [place] 02/27/2019, 5:48 AM

## 2019-02-27 NOTE — Progress Notes (Addendum)
PROGRESS NOTE  ALEXANDRA POSADAS GXQ:[id number] DOB: 12-03-1940 DOA: 02/27/2019  PCP: Jodi Marble, MD  Brief History/Interval Summary: 78 year old male with hypertension, prostate cancer, prior CVA who was admitted to the hospital 02/08/2019 from [facility] with profound hypoxic respiratory failure in the setting of COVID-19, and admitted to the ICU requiring heated high flow nasal cannula. Patient was transferred to the floor on 7/23 as he wished to get out of the ICU and met criteria for the floor requiring 15 L high flow nasal cannula.  On the floor patient continued to have episodes of anxiety.  He had a lot of trouble maintaining his oxygen saturations.  This continued for a few days and then finally on 7/28 he got worse.  Multiple discussions held with patient as well as family members.  He was finally intubated and transferred to the ICU.  Reason for Visit: Acute respiratory disease due to COVID-19  Consultants: Pulmonology  Procedures:  Intubation 7/28 Chest tube placement 7/28  Antibiotics: Anti-infectives (From admission, onward)   Start     Dose/Rate Route Frequency Ordered Stop   02/17/19 1000  vancomycin (VANCOCIN) 1,500 mg in sodium chloride 0.9 % 500 mL IVPB  Status:  Discontinued     1,500 mg 250 mL/hr over 120 Minutes Intravenous Every 24 hours 02/16/19 0934 02/17/19 1338   02/16/19 1000  vancomycin (VANCOCIN) 2,000 mg in sodium chloride 0.9 % 500 mL IVPB     2,000 mg 250 mL/hr over 120 Minutes Intravenous  Once 02/16/19 0934 02/16/19 1250   02/14/19 0700  remdesivir 100 mg in sodium chloride 0.9 % 250 mL IVPB     100 mg 500 mL/hr over 30 Minutes Intravenous Every 24 hours 02/14/19 0010 02/17/19 1625       Subjective/Interval History: Patient is intubated and sedated.    Assessment/Plan:  Acute Hypoxic Resp. Failure due to Acute Covid 19 Viral Illness  Vent Mode: PRVC FiO2 (%):  [60 %-100 %] 60 % Set Rate:  [26 bmp-32 bmp] 32 bmp Vt Set:  [420 mL] 420 mL  PEEP:  [12 cmH20] 12 cmH20 Plateau Pressure:  [28 cmH20-37 cmH20] 28 cmH20     Component Value Date/Time   PHART 7.213 (L) 02/27/2019 0459   PCO2ART 87.0 (HH) 02/27/2019 0459   PO2ART 95.0 02/27/2019 0459   HCO3 34.9 (H) 02/27/2019 0459   TCO2 37 (H) 02/27/2019 0459   O2SAT 95.0 02/27/2019 0459    COVID-19 Labs  Recent Labs    02/25/19 0500 02/26/19 0520 02/27/19 0542 02/27/19 0543  DDIMER 1.81* 1.45* 1.61*  --   FERRITIN 118 141  --  179  CRP <0.8 <0.8  --  <0.8    Lab Results  Component Value Date   SARSCOV2NAA POSITIVE (A) 01/30/2019   SARSCOV2NAA Detected (A) 02/08/2019     Fever: Remains afebrile Oxygen requirements: Mechanical ventilation.  60% FiO2.  Saturating in the 90s Antibacterials: Not on any antibiotics Remdesivir: Completed course Steroids: On Solu-Medrol 60 mg every 12 hours Diuretics: Not on scheduled diuretics Actemra: He was given 2 doses Vitamin C and Zinc: Continue DVT Prophylaxis:  Lovenox 40 mg every 12 hours  Research Studies: He was enrolled into the fibrinogen research study by Dr. Chase Caller  Patient now intubated.  Remains on mechanical ventilation.  Pulmonology is following and managing.  He has completed treatment for COVID-19.  He is still on steroids however.  Inflammatory markers have been unremarkable for the last several days.  Leukocytosis is most likely due  to steroids.  Pneumothorax Has a chest tube.  Management per pulmonology.  Shock On pressors.  Wean down as tolerated.  History of stroke Patient on Plavix which is being continued.  Intolerant to statins.  Hyperglycemia Secondary to steroids.  SSI.  CBGs are reasonably well controlled.  Acute renal failure with hyperkalemia We will give Lokelma to bring down his potassium level .  Bump in BUN and creatinine noted.  Monitor volume status.  Recheck labs tomorrow.  Monitor urine output.  He did make sufficient amount of urine over the last 24 hours.  Bacteremia 1 out  of 4 bottles with coag negative staph.  Another bottle with staph hominis.  These are contaminants.  History of coronary artery disease with prior PCI/pacemaker Stable.  Obesity BMI 33.06.  History of prostate cancer Stable.  Essential hypertension Monitor blood pressures closely.  Severe anxiety Likely related to his acute illness.  Nutrition Continue tube feedings.   DVT Prophylaxis: Lovenox PUD Prophylaxis: Protonix Code Status: Full code Family Communication: We will discuss with family Disposition Plan: Remain in ICU   Medications:  Scheduled: . chlorhexidine gluconate (MEDLINE KIT)  15 mL Mouth Rinse BID  . Chlorhexidine Gluconate Cloth  6 each Topical Daily  . clopidogrel  75 mg Oral Daily  . enoxaparin (LOVENOX) injection  40 mg Subcutaneous Q12H  . feeding supplement (PRO-STAT SUGAR FREE 64)  30 mL Per Tube QID  . insulin aspart  0-20 Units Subcutaneous TID WC  . insulin aspart  0-5 Units Subcutaneous QHS  . mouth rinse  15 mL Mouth Rinse 10 times per day  . methylPREDNISolone sodium succinate  60 mg Intravenous Q12H  . [START ON 02/28/2019] pantoprazole sodium  40 mg Per Tube Daily  . polyethylene glycol  17 g Oral BID  . sodium chloride flush  10-40 mL Intracatheter Q12H  . sodium chloride flush  3 mL Intravenous Q12H  . [START ON 03/13/2019] pamrevlumab or placebo  35 mg/kg Intravenous Once  . traZODone  100 mg Oral QHS  . ascorbic acid  500 mg Oral Daily  . zinc sulfate  220 mg Oral Daily   Continuous: . sodium chloride    . dexmedetomidine (PRECEDEX) IV infusion 1.2 mcg/kg/hr (02/27/19 1045)  . feeding supplement (VITAL HIGH PROTEIN)    . fentaNYL infusion INTRAVENOUS 250 mcg/hr (02/27/19 1046)  . midazolam 2 mg/hr (02/27/19 0700)  . norepinephrine (LEVOPHED) Adult infusion 10 mcg/min (02/27/19 0700)   AYO:KHTXHF chloride, acetaminophen **OR** acetaminophen, guaiFENesin-codeine, guaiFENesin-dextromethorphan, hydrALAZINE, Ipratropium-Albuterol, lip  balm, ondansetron **OR** ondansetron (ZOFRAN) IV, phenol, polyethylene glycol, sodium chloride, sodium chloride flush, sodium chloride flush   Objective:  Vital Signs  Vitals:   02/27/19 1230 02/27/19 1245 02/27/19 1300 02/27/19 1315  BP: 123/60 131/61 (!) 132/56 (!) 122/59  Pulse: 77 66 64 70  Resp: (!) 25 17 (!) 28 (!) 32  Temp: 99.2 F (37.3 C)     TempSrc: Axillary     SpO2: 93% 93% 93% 92%  Weight:      Height:        Intake/Output Summary (Last 24 hours) at 02/27/2019 1344 Last data filed at 02/27/2019 1215 Gross per 24 hour  Intake 1403.59 ml  Output 810 ml  Net 593.59 ml   Filed Weights   02/02/2019 2152 02/27/19 0438  Weight: 105.7 kg 101.6 kg    General appearance: Intubated and sedated. Resp: Coarse breath sounds bilaterally with crackles at the bases.  No wheezing or rhonchi. Cardio: S1-S2 is normal regular.  No S3-S4.  No rubs murmurs or bruit GI: Abdomen is soft.  Nontender nondistended.  Bowel sounds are present normal.  No masses organomegaly Extremities: No edema.   Neurologic: Sedated.   Lab Results:  Data Reviewed: I have personally reviewed following labs and imaging studies  CBC: Recent Labs  Lab 02/21/19 0611  02/24/19 0555 02/26/19 0520 02/26/19 1403 02/26/19 2203 02/27/19 0101 02/27/19 0459 02/27/19 0542  WBC 18.4*  --  17.5* 23.3*  --   --   --   --  33.2*  NEUTROABS 16.1*  --   --   --   --   --   --   --   --   HGB 15.8   < > 17.1* 16.9 18.0* 19.4* 17.7* 17.3* 15.3  HCT 48.9   < > 51.5 51.7 53.0* 57.0* 52.0 51.0 49.9  MCV 91.9  --  91.8 93.7  --   --   --   --  97.7  PLT 206  --  147* 120*  --   --   --   --  108*   < > = values in this interval not displayed.    Basic Metabolic Panel: Recent Labs  Lab 02/22/19 0500 02/23/19 0529  02/24/19 0555 02/26/19 0520 02/26/19 1403 02/26/19 1955 02/26/19 2203 02/27/19 0101 02/27/19 0459 02/27/19 0542  NA 137 140   < > 139 138 133*  --  135 135 133* 140  K 4.3 4.9   < > 4.5 5.0  4.9  --  5.5* 5.5* 6.3* 5.7*  CL 99 103  --  98 99  --   --   --   --   --  102  CO2 28 28  --  30 29  --   --   --   --   --  29  GLUCOSE 104* 106*  --  109* 100*  --   --   --   --   --  132*  BUN 36* 29*  --  33* 30*  --   --   --   --   --  33*  CREATININE 1.17 0.97  --  1.14 1.11  --   --   --   --   --  1.51*  CALCIUM 8.1* 8.4*  --  8.6* 8.4*  --   --   --   --   --  7.4*  MG  --   --   --  2.4  --   --  2.5*  --   --   --  2.3  PHOS  --   --   --   --   --   --  3.6  --   --   --  6.5*   < > = values in this interval not displayed.    GFR: Estimated Creatinine Clearance: 47.4 mL/min (A) (by C-G formula based on SCr of 1.51 mg/dL (H)).  Liver Function Tests: Recent Labs  Lab 02/21/19 0611 02/22/19 0500 02/23/19 0529 02/24/19 0555  AST 32 36 37 44*  ALT 47* 49* 57* 70*  ALKPHOS 61 55 65 65  BILITOT 1.2 1.4* 1.5* 1.8*  PROT 5.6* 5.2* 5.5* 5.8*  ALBUMIN 3.0* 2.9* 3.1* 3.3*     CBG: Recent Labs  Lab 02/26/19 1251 02/26/19 1619 02/26/19 2327 02/27/19 0436 02/27/19 1057  GLUCAP 166* 166* 240* 151* 176*     Anemia Panel: Recent Labs    02/26/19 0520  02/27/19 0543  FERRITIN 141 179     Radiology Studies: Dg Chest Port 1 View  Result Date: 02/26/2019 CLINICAL DATA:  Follow-up chest tube and endotracheal tube. EXAM: PORTABLE CHEST 1 VIEW COMPARISON:  Earlier same day FINDINGS: Endotracheal tube tip is 4 cm above the carina. Orogastric or nasogastric tube enters the stomach. Left arm PICC tip is in the SVC at the SVC RA junction. Right chest tube is in place. No visible pneumothorax on the right. Widespread bilateral pulmonary infiltrates persist IMPRESSION: Endotracheal tube and right chest tube well positioned. Resolution of right pneumothorax. Widespread bilateral infiltrates persist. Electronically Signed   By: Nelson Chimes M.D.   On: 02/26/2019 21:59   Dg Chest Port 1 View  Result Date: 02/26/2019 CLINICAL DATA:  Acute respiratory failure with hypoxia. EXAM:  PORTABLE CHEST 1 VIEW COMPARISON:  02/24/2019 and earlier studies. FINDINGS: Small right sided pneumothorax, not evident on the prior exam. Lung volumes are low. There are interstitial and hazy airspace opacities in the left mid lung and in both lower lungs, similar to the prior exam. No new areas of lung opacity. Left-sided PICC and right anterior chest wall pacemaker are stable. IMPRESSION: 1. New, small right-sided pneumothorax. 2. No other change from the most recent prior study. Persistent lung opacities consistent with multifocal pneumonia. Electronically Signed   By: Lajean Manes M.D.   On: 02/26/2019 16:45   Dg Abd Portable 1v  Result Date: 02/26/2019 CLINICAL DATA:  Nasogastric placement EXAM: PORTABLE ABDOMEN - 1 VIEW COMPARISON:  None. FINDINGS: Orogastric or nasogastric tube enters the stomach, loops to the antrum and has its tip in the fundus. IMPRESSION: Orogastric or nasogastric tube in the gastric fundus. Electronically Signed   By: Nelson Chimes M.D.   On: 02/26/2019 22:00       LOS: 14 days   Chaniqua Brisby [employer] on [email or web address]  02/27/2019, 1:44 PM

## 2019-02-27 NOTE — Progress Notes (Signed)
Critical ABG results called to [place].   7.21 87 95 35 95%

## 2019-02-28 ENCOUNTER — Inpatient Hospital Stay (HOSPITAL_COMMUNITY): Payer: Medicare Other

## 2019-02-28 DIAGNOSIS — D696 Thrombocytopenia, unspecified: Secondary | ICD-10-CM

## 2019-02-28 LAB — GLUCOSE, CAPILLARY
Glucose-Capillary: 152 mg/dL — ABNORMAL HIGH (ref 70–99)
Glucose-Capillary: 159 mg/dL — ABNORMAL HIGH (ref 70–99)
Glucose-Capillary: 164 mg/dL — ABNORMAL HIGH (ref 70–99)
Glucose-Capillary: 180 mg/dL — ABNORMAL HIGH (ref 70–99)
Glucose-Capillary: 152 mg/dL — ABNORMAL HIGH (ref 70–99)

## 2019-02-28 LAB — POCT I-STAT 7, (LYTES, BLD GAS, ICA,H+H)
Acid-Base Excess: 6 mmol/L — ABNORMAL HIGH (ref 0.0–2.0)
Bicarbonate: 35.5 mmol/L — ABNORMAL HIGH (ref 20.0–28.0)
Calcium, Ion: 1.21 mmol/L (ref 1.15–1.40)
HCT: 44 % (ref 39.0–52.0)
Hemoglobin: 15 g/dL (ref 13.0–17.0)
O2 Saturation: 73 %
Patient temperature: 98.6
Potassium: 4.7 mmol/L (ref 3.5–5.1)
Sodium: 142 mmol/L (ref 135–145)
TCO2: 38 mmol/L — ABNORMAL HIGH (ref 22–32)
pCO2 arterial: 70.9 mmHg (ref 32.0–48.0)
pH, Arterial: 7.307 — ABNORMAL LOW (ref 7.350–7.450)
pO2, Arterial: 44 mmHg — ABNORMAL LOW (ref 83.0–108.0)

## 2019-02-28 LAB — CBC
HCT: 44.8 % (ref 39.0–52.0)
Hemoglobin: 14 g/dL (ref 13.0–17.0)
MCH: 30.7 pg (ref 26.0–34.0)
MCHC: 31.3 g/dL (ref 30.0–36.0)
MCV: 98.2 fL (ref 80.0–100.0)
Platelets: 72 10*3/uL — ABNORMAL LOW (ref 150–400)
RBC: 4.56 MIL/uL (ref 4.22–5.81)
RDW: 15.9 % — ABNORMAL HIGH (ref 11.5–15.5)
WBC: 18.7 10*3/uL — ABNORMAL HIGH (ref 4.0–10.5)
nRBC: 0 % (ref 0.0–0.2)

## 2019-02-28 LAB — BASIC METABOLIC PANEL
Anion gap: 5 (ref 5–15)
BUN: 41 mg/dL — ABNORMAL HIGH (ref 8–23)
CO2: 30 mmol/L (ref 22–32)
Calcium: 7.6 mg/dL — ABNORMAL LOW (ref 8.9–10.3)
Chloride: 105 mmol/L (ref 98–111)
Creatinine, Ser: 1.17 mg/dL (ref 0.61–1.24)
GFR calc Af Amer: >60 mL/min (ref >60)
GFR calc non Af Amer: 59 mL/min — ABNORMAL LOW (ref >60)
Glucose, Bld: 132 mg/dL — ABNORMAL HIGH (ref 70–99)
Potassium: 5.6 mmol/L — ABNORMAL HIGH (ref 3.5–5.1)
Sodium: 140 mmol/L (ref 135–145)

## 2019-02-28 LAB — COMPREHENSIVE METABOLIC PANEL
ALT: 49 U/L — ABNORMAL HIGH (ref 0–44)
AST: 27 U/L (ref 15–41)
Albumin: 2.8 g/dL — ABNORMAL LOW (ref 3.5–5.0)
Alkaline Phosphatase: 64 U/L (ref 38–126)
Anion gap: 5 (ref 5–15)
BUN: 43 mg/dL — ABNORMAL HIGH (ref 8–23)
CO2: 32 mmol/L (ref 22–32)
Calcium: 7.9 mg/dL — ABNORMAL LOW (ref 8.9–10.3)
Chloride: 101 mmol/L (ref 98–111)
Creatinine, Ser: 1.17 mg/dL (ref 0.61–1.24)
GFR calc Af Amer: >60 mL/min (ref >60)
GFR calc non Af Amer: 59 mL/min — ABNORMAL LOW (ref >60)
Glucose, Bld: 161 mg/dL — ABNORMAL HIGH (ref 70–99)
Potassium: 5.3 mmol/L — ABNORMAL HIGH (ref 3.5–5.1)
Sodium: 138 mmol/L (ref 135–145)
Total Bilirubin: 1.2 mg/dL (ref 0.3–1.2)
Total Protein: 4.9 g/dL — ABNORMAL LOW (ref 6.5–8.1)

## 2019-02-28 MED ORDER — FENTANYL CITRATE (PF) 100 MCG/2ML IJ SOLN: 50.0000 ug | INTRAMUSCULAR | Status: DC | PRN

## 2019-02-28 MED ORDER — NOREPINEPHRINE 4 MG/250ML-% IV SOLN
0.0000 ug/min | INTRAVENOUS | Status: DC
  Administered 2019-02-28: 2 ug/min via INTRAVENOUS
  Administered 2019-03-01 – 2019-03-02 (×3): 3 ug/min via INTRAVENOUS
  Administered 2019-03-04 – 2019-03-05 (×2): 2 ug/min via INTRAVENOUS
  Filled 2019-02-28 (×4): qty 250

## 2019-02-28 MED ORDER — FENTANYL CITRATE (PF) 100 MCG/2ML IJ SOLN
50.0000 ug | INTRAMUSCULAR | Status: DC | PRN
  Administered 2019-02-28 – 2019-03-01 (×2): 100 ug via INTRAVENOUS
  Filled 2019-02-28 (×2): qty 2

## 2019-02-28 MED ORDER — MIDAZOLAM HCL 2 MG/2ML IJ SOLN
2.0000 mg | INTRAMUSCULAR | Status: DC | PRN
  Administered 2019-02-28 – 2019-03-01 (×3): 2 mg via INTRAVENOUS
  Filled 2019-02-28 (×3): qty 2

## 2019-02-28 MED ORDER — SODIUM POLYSTYRENE SULFONATE 15 GM/60ML PO SUSP
30.0000 g | Freq: Once | ORAL | Status: AC
  Administered 2019-02-28: 30 g
  Filled 2019-02-28: qty 120

## 2019-02-28 NOTE — Progress Notes (Signed)
[place] Progress Note Patient Name: Clayton Sawyer DOB: 1940-12-07 MRN: [id number]   Date of Service  02/28/2019  HPI/Events of Note  Hypoxia - Sat = 86% on 100%/P 12. I appreciate no recurrent or new pneumothorax on portable CXR.   eICU Interventions  Will order: 1. Increase PEEP to 14.     Intervention Category Major Interventions: Hypoxemia - evaluation and management  Sommer,Steven Eugene 02/28/2019, 11:45 PM

## 2019-02-28 NOTE — Progress Notes (Signed)
Pt's wife called unit. Updated of pt condition. Informed of current plan of care. Pt's wife appreciative of update.

## 2019-02-28 NOTE — Progress Notes (Signed)
Pt awoke agitated and restless. Pulling tubes and thrashing in bed even after PRN boluses. Required 3 person to keep from pulling out any tubes and lines. Dr. Roderic Palau notified

## 2019-02-28 NOTE — Progress Notes (Signed)
PROGRESS NOTE  Clayton Sawyer EUM:[id number] DOB: 1941/04/24 DOA: 02/06/2019  PCP: Jodi Marble, MD  Brief History/Interval Summary: 78 year old male with hypertension, prostate cancer, prior CVA who was admitted to the hospital 02/19/2019 from [facility] with profound hypoxic respiratory failure in the setting of COVID-19, and admitted to the ICU requiring heated high flow nasal cannula. Patient was transferred to the floor on 7/23 as he wished to get out of the ICU and met criteria for the floor requiring 15 L high flow nasal cannula.  On the floor patient continued to have episodes of anxiety.  He had a lot of trouble maintaining his oxygen saturations.  This continued for a few days and then finally on 7/28 he got worse.  Multiple discussions held with patient as well as family members.  He was finally intubated and transferred to the ICU.  Reason for Visit: Acute respiratory disease due to COVID-19  Consultants: Pulmonology  Procedures:  Intubation 7/28 Chest tube placement 7/28  Antibiotics: Anti-infectives (From admission, onward)   Start     Dose/Rate Route Frequency Ordered Stop   02/17/19 1000  vancomycin (VANCOCIN) 1,500 mg in sodium chloride 0.9 % 500 mL IVPB  Status:  Discontinued     1,500 mg 250 mL/hr over 120 Minutes Intravenous Every 24 hours 02/16/19 0934 02/17/19 1338   02/16/19 1000  vancomycin (VANCOCIN) 2,000 mg in sodium chloride 0.9 % 500 mL IVPB     2,000 mg 250 mL/hr over 120 Minutes Intravenous  Once 02/16/19 0934 02/16/19 1250   02/14/19 0700  remdesivir 100 mg in sodium chloride 0.9 % 250 mL IVPB     100 mg 500 mL/hr over 30 Minutes Intravenous Every 24 hours 02/14/19 0010 02/17/19 1625       Subjective/Interval History: Patient is intubated and sedated.    Assessment/Plan:  Acute Hypoxic Resp. Failure due to Acute Covid 19 Viral Illness  Vent Mode: PSV;CPAP FiO2 (%):  [50 %-60 %] 50 % Set Rate:  [32 bmp] 32 bmp Vt Set:  [420 mL] 420 mL  PEEP:  [5 cmH20-10 cmH20] 5 cmH20 Pressure Support:  [8 cmH20] 8 cmH20 Plateau Pressure:  [22 cmH20-30 cmH20] 24 cmH20     Component Value Date/Time   PHART 7.213 (L) 02/27/2019 0459   PCO2ART 87.0 (HH) 02/27/2019 0459   PO2ART 95.0 02/27/2019 0459   HCO3 34.9 (H) 02/27/2019 0459   TCO2 37 (H) 02/27/2019 0459   O2SAT 95.0 02/27/2019 0459    COVID-19 Labs  Recent Labs    02/26/19 0520 02/27/19 0542 02/27/19 0543  DDIMER 1.45* 1.61*  --   FERRITIN 141  --  179  CRP <0.8  --  <0.8    Lab Results  Component Value Date   SARSCOV2NAA POSITIVE (A) 01/31/2019   SARSCOV2NAA Detected (A) 02/08/2019     Fever: Remains afebrile Oxygen requirements: Mechanical ventilation.  60% FiO2.  Saturating in the 90s Antibacterials: Not on any antibiotics Remdesivir: Completed course Steroids: On Solu-Medrol 60 mg every 12 hours Diuretics: Not on scheduled diuretics Actemra: He was given 2 doses Vitamin C and Zinc: Continue DVT Prophylaxis:  SCDs  Research Studies: He was enrolled into the fibrinogen research study by Dr. Chase Caller  Patient now intubated.  Remains on mechanical ventilation.  Pulmonology is following and managing.  He has completed treatment for COVID-19.  He is still on steroids however.  Inflammatory markers have been unremarkable for the last several days.  Leukocytosis is most likely due to steroids.  Pneumothorax Has a chest tube.  Management per pulmonology.  Shock Continues on low dose pressors.  Wean down as tolerated.  History of stroke Patient on Plavix which is being continued.  Intolerant to statins.  Hyperglycemia Secondary to steroids.  SSI.  CBGs are reasonably well controlled.  Acute renal failure with hyperkalemia Potassium level is trending down.  Bump in BUN and creatinine noted.  Monitor volume status.  Recheck labs tomorrow.  Monitor urine output.  Currently he has fair urine output.  Bacteremia 1 out of 4 bottles with coag negative staph.   Another bottle with staph hominis.  These are contaminants.  Thrombocytopenia Unclear etiology. Possibly related to acute illness. Will hold lovenox and follow platelets for now.  History of coronary artery disease with prior PCI/pacemaker Stable.  Obesity BMI 33.06.  History of prostate cancer Stable.  Essential hypertension Monitor blood pressures closely.  Severe anxiety Likely related to his acute illness.  Nutrition Continue tube feedings.   DVT Prophylaxis: scd PUD Prophylaxis: Protonix Code Status: Full code Family Communication: updated grand daughter 7/30 Disposition Plan: Remain in ICU   Medications:  Scheduled: . chlorhexidine gluconate (MEDLINE KIT)  15 mL Mouth Rinse BID  . Chlorhexidine Gluconate Cloth  6 each Topical Daily  . clopidogrel  75 mg Per Tube Daily  . feeding supplement (PRO-STAT SUGAR FREE 64)  30 mL Per Tube QID  . insulin aspart  0-20 Units Subcutaneous Q4H  . insulin aspart  2 Units Subcutaneous Q4H  . mouth rinse  15 mL Mouth Rinse 10 times per day  . methylPREDNISolone sodium succinate  60 mg Intravenous Q12H  . pantoprazole sodium  40 mg Per Tube Daily  . polyethylene glycol  17 g Per Tube BID  . sodium chloride flush  10-40 mL Intracatheter Q12H  . sodium chloride flush  3 mL Intravenous Q12H  . [START ON 03/13/2019] pamrevlumab or placebo  35 mg/kg Intravenous Once  . traZODone  100 mg Oral QHS   Continuous: . sodium chloride    . dexmedetomidine (PRECEDEX) IV infusion 0.8 mcg/kg/hr (02/28/19 1400)  . dextrose    . feeding supplement (VITAL HIGH PROTEIN) 1,000 mL (02/28/19 0618)  . fentaNYL infusion INTRAVENOUS 175 mcg/hr (02/28/19 1400)  . midazolam 2 mg/hr (02/28/19 1400)  . norepinephrine (LEVOPHED) Adult infusion 2 mcg/min (02/28/19 1242)   QBH:ALPFXT chloride, acetaminophen **OR** acetaminophen, fentaNYL (SUBLIMAZE) injection, fentaNYL (SUBLIMAZE) injection, guaiFENesin-codeine, guaiFENesin-dextromethorphan, hydrALAZINE,  Ipratropium-Albuterol, lip balm, midazolam, [DISCONTINUED] ondansetron **OR** ondansetron (ZOFRAN) IV, phenol, sodium chloride, sodium chloride flush, sodium chloride flush   Objective:  Vital Signs  Vitals:   02/28/19 1120 02/28/19 1200 02/28/19 1300 02/28/19 1400  BP: (!) 159/60 (!) 103/49 (!) 116/54 114/64  Pulse: 77 (!) 112 (!) 50 (!) 37  Resp: _0 Temp:      TempSrc:      SpO2: 92% 91% 91% 95%  Weight:      Height:        Intake/Output Summary (Last 24 hours) at 02/28/2019 1427 Last data filed at 02/28/2019 1400 Gross per 24 hour  Intake 2228.8 ml  Output 1778 ml  Net 450.8 ml   Filed Weights   02/04/2019 2152 02/27/19 0438 02/28/19 0500  Weight: 105.7 kg 101.6 kg 103.4 kg    General exam: intubated and sedated Respiratory system: Clear to auscultation. Respiratory effort normal. Cardiovascular system:RRR. No murmurs, rubs, gallops. Gastrointestinal system: Abdomen is nondistended, soft and nontender. No organomegaly or masses felt. Normal bowel sounds heard.  Central nervous system: sedated, limited exam Extremities: No C/C/E, +pedal pulses Skin: No rashes, lesions or ulcers Psychiatry: sedated     Lab Results:  Data Reviewed: I have personally reviewed following labs and imaging studies  CBC: Recent Labs  Lab 02/24/19 0555 02/26/19 0520  02/26/19 2203 02/27/19 0101 02/27/19 0459 02/27/19 0542 02/28/19 0544  WBC 17.5* 23.3*  --   --   --   --  33.2* 18.7*  HGB 17.1* 16.9   < > 19.4* 17.7* 17.3* 15.3 14.0  HCT 51.5 51.7   < > 57.0* 52.0 51.0 49.9 44.8  MCV 91.8 93.7  --   --   --   --  97.7 98.2  PLT 147* 120*  --   --   --   --  108* 72*   < > = values in this interval not displayed.    Basic Metabolic Panel: Recent Labs  Lab 02/24/19 0555 02/26/19 0520  02/26/19 1955  02/27/19 0459 02/27/19 0542 02/27/19 1700 02/27/19 2315 02/28/19 0544  NA 139 138   < >  --    < > 133* 140 138 140 138  K 4.5 5.0   < >  --    < > 6.3* 5.7* 6.0*  5.6* 5.3*  CL 98 99  --   --   --   --  102 101 105 101  CO2 30 29  --   --   --   --  _0 32  GLUCOSE 109* 100*  --   --   --   --  132* 209* 132* 161*  BUN 33* 30*  --   --   --   --  33* 35* 41* 43*  CREATININE 1.14 1.11  --   --   --   --  1.51* 1.32* 1.17 1.17  CALCIUM 8.6* 8.4*  --   --   --   --  7.4* 8.0* 7.6* 7.9*  MG 2.4  --   --  2.5*  --   --  2.3  --   --   --   PHOS  --   --   --  3.6  --   --  6.5*  --   --   --    < > = values in this interval not displayed.    GFR: Estimated Creatinine Clearance: 61.7 mL/min (by C-G formula based on SCr of 1.17 mg/dL).  Liver Function Tests: Recent Labs  Lab 02/22/19 0500 02/23/19 0529 02/24/19 0555 02/28/19 0544  AST 36 37 44* 27  ALT 49* 57* 70* 49*  ALKPHOS 55 65 65 64  BILITOT 1.4* 1.5* 1.8* 1.2  PROT 5.2* 5.5* 5.8* 4.9*  ALBUMIN 2.9* 3.1* 3.3* 2.8*     CBG: Recent Labs  Lab 02/27/19 1933 02/27/19 2315 02/28/19 0443 02/28/19 0749 02/28/19 1142  GLUCAP 173* 127* 152* 159* 164*     Anemia Panel: Recent Labs    02/26/19 0520 02/27/19 0543  FERRITIN 141 179     Radiology Studies: Dg Chest Port 1 View  Result Date: 02/26/2019 CLINICAL DATA:  Follow-up chest tube and endotracheal tube. EXAM: PORTABLE CHEST 1 VIEW COMPARISON:  Earlier same day FINDINGS: Endotracheal tube tip is 4 cm above the carina. Orogastric or nasogastric tube enters the stomach. Left arm PICC tip is in the SVC at the SVC RA junction. Right chest tube is in place. No visible pneumothorax on the right. Widespread bilateral pulmonary infiltrates persist IMPRESSION:  Endotracheal tube and right chest tube well positioned. Resolution of right pneumothorax. Widespread bilateral infiltrates persist. Electronically Signed   By: Nelson Chimes M.D.   On: 02/26/2019 21:59   Dg Chest Port 1 View  Result Date: 02/26/2019 CLINICAL DATA:  Acute respiratory failure with hypoxia. EXAM: PORTABLE CHEST 1 VIEW COMPARISON:  02/24/2019 and earlier studies.  FINDINGS: Small right sided pneumothorax, not evident on the prior exam. Lung volumes are low. There are interstitial and hazy airspace opacities in the left mid lung and in both lower lungs, similar to the prior exam. No new areas of lung opacity. Left-sided PICC and right anterior chest wall pacemaker are stable. IMPRESSION: 1. New, small right-sided pneumothorax. 2. No other change from the most recent prior study. Persistent lung opacities consistent with multifocal pneumonia. Electronically Signed   By: Lajean Manes M.D.   On: 02/26/2019 16:45   Dg Abd Portable 1v  Result Date: 02/26/2019 CLINICAL DATA:  Nasogastric placement EXAM: PORTABLE ABDOMEN - 1 VIEW COMPARISON:  None. FINDINGS: Orogastric or nasogastric tube enters the stomach, loops to the antrum and has its tip in the fundus. IMPRESSION: Orogastric or nasogastric tube in the gastric fundus. Electronically Signed   By: Nelson Chimes M.D.   On: 02/26/2019 22:00       LOS: 15 days   [employer] on [email or web address]  02/28/2019, 2:27 PM

## 2019-02-28 NOTE — Progress Notes (Signed)
[place] Progress Note Patient Name: Clayton Sawyer DOB: 08-Mar-1941 MRN: [id number]   Date of Service  02/28/2019  HPI/Events of Note  Multiple issues: 1. Hypoxia - Sat = 85% on 100%/P 10 and 2. Fentanyl IV infusion expiring.   eICU Interventions  Will order: 1. Increase PEEP to 12. 2. Will extend Fentanyl IV infusion order.      Intervention Category Major Interventions: Hypoxemia - evaluation and management;Other:  Sommer,Steven Cornelia Copa 02/28/2019, 10:16 PM

## 2019-02-28 NOTE — Progress Notes (Signed)
Pt's granddaughter called unit. Updated of pt condition. Pt's granddaughter appreciative of update

## 2019-02-28 NOTE — Progress Notes (Signed)
PT Cancellation Note  Patient Details Name: Clayton Sawyer MRN: [id number] DOB: [birth year]   Cancelled Treatment:    Reason Eval/Treat Not Completed: Medical issues which prohibited therapy ,sedated on vent. Will check back 8/4.  Claretha Cooper 02/28/2019, 7:04 AM [place]  Office [phone]

## 2019-02-28 NOTE — Progress Notes (Signed)
Pts granddaughter Apolonio Schneiders called unit and was updated on pts condition.

## 2019-02-28 NOTE — Progress Notes (Signed)
Arterial blood gas results given to [facility] at [place].

## 2019-02-28 NOTE — Progress Notes (Signed)
[place] Progress Note Patient Name: Clayton Sawyer DOB: [birth year] MRN: [id number]   Date of Service  02/28/2019  HPI/Events of Note  Hypoxia - Patient intubated and ventilated. History of pneumothorax.   eICU Interventions  Will order: 1. Portable CXR STAT.  2. ABG STAT.     Intervention Category Major Interventions: Hypoxemia - evaluation and management;Respiratory failure - evaluation and management  Sommer,Steven Cornelia Copa 02/28/2019, 10:56 PM

## 2019-02-28 NOTE — Progress Notes (Signed)
NAME:  Clayton Sawyer, MRN:  [id number], DOB:  01-16-1941, LOS: 51 ADMISSION DATE:  02/16/2019, CONSULTATION DATE:  February 13 2019 REFERRING MD:  Dr. Marcille Blanco, CHIEF COMPLAINT:  Cough   Brief History   78 year old male admitted on February 13, 2019 in the setting of severe acute respiratory failure with hypoxemia from SARS-CoV-2.  Past Medical History  Coronary artery disease Diastolic heart failure Hypertension CVA in past Hyperlipidemia History of prostate cancer Obstructive sleep apnea  Significant Hospital Events   7/15 Admit, transfer to [facility] 7/16 enrolled in fibrinogen research drug  7/27 PCCM consulted again for worsening oxygenation, pneumothorax on right 7/28 transferred to the intensive care unit, full code, intubated, right-sided chest tube  Consults:  Pulmonary and critical care medicine  Procedures:  ETT 7/28 CVC 7/28 Pigtail R 7/28  Significant Diagnostic Tests:    Micro Data:  7/10, 7/15 SARS-CoV-2 positive 7/16 blood > coag neg staph in 1/4  Antimicrobials:  Ceftriaxone July 14 Azithromycin July 15 Remdesivir July 15 > July 19 Actemra July 15, July 19 Vancomycin July 18, July 19 Solumedrol July 14 Pamrevlumab vs placebo, next scheduled infusion July 29  Interim history/subjective:   Patient remains in the intensive care unit on mechanical life support.  Was able to decrease FiO2 requirements this morning.  This was discussed with nursing staff and respiratory therapy at bedside.  Objective   Blood pressure (!) 109/56, pulse 63, temperature 100.2 F (37.9 C), temperature source Oral, resp. rate (!) 28, height 5\' 9"  (1.753 m), weight 103.4 kg, SpO2 92 %. CVP:  [24 mmHg] 24 mmHg  Vent Mode: PRVC FiO2 (%):  [50 %-60 %] 50 % Set Rate:  [32 bmp] 32 bmp Vt Set:  [420 mL] 420 mL PEEP:  [10 cmH20] 10 cmH20 Plateau Pressure:  [22 cmH20-30 cmH20] 28 cmH20   Intake/Output Summary (Last 24 hours) at 02/28/2019 0855 Last data filed at 02/28/2019 0600 Gross per  24 hour  Intake 2260.03 ml  Output 1623 ml  Net 637.03 ml   Filed Weights   02/04/2019 2152 02/27/19 0438 02/28/19 0500  Weight: 105.7 kg 101.6 kg 103.4 kg    Examination:  General: Intubated, sedated unresponsive, on mechanical life support HENT: NCAT, sclera clear, trachea midline, endotracheal tube in place PULM: Bilateral ventilated breath sounds CV: Regular rate and rhythm, S1-S2 GI: Bowel sounds, nontender nondistended MSK: Normal bulk and tone Extremities: No significant edema Neuro: Intubated sedated on mechanical ventilation  CXR: 02/26/2019 Imaging reviewed.  Endotracheal tube in place good position of right chest tube.  Resolution of pneumothorax.  Patient still has bilateral infiltrates. The patient's images have been independently reviewed by me.    Resolved Hospital Problem list     Assessment & Plan:   Severe acute hypoxemic respiratory failure in setting of COVID-19 pneumonia:  Right-sided pneumothorax status post 14 French pigtail placement  He is having slow improvement.  Have been able to wean down to 40% FiO2.  Continue mechanical ventilation per ARDS protocol Target TVol 6-8cc/kgIBW Target Plateau Pressure < 30cm H20 Target driving pressure less than 15 cm of water Target PaO2 55-65: titrate PEEP/FiO2 per protocol As long as PaO2 to FiO2 ratio is less than 1:150 position in prone position for 16 hours a day Check CVP daily if CVL in place Target CVP less than 4, diurese as necessary Ventilator associated pneumonia prevention protocol   Chest tube remains in place, no air leak Continue to -20 cm of water suction Post pigtail catheter order  set placed for flushing and dressing changes Repeat chest x-ray in the morning  Sedation needs while on mechanical ventilation. Continue use of Precedex At this time would like to start decreasing some of his opiate and benzodiazepine infusion rates. Goal RA SS approximately -2 This was discussed with nursing  at bedside. PRN orders given for Versed as well as fentanyl pushes to be used as we wean from the drip rates.  Shock, hypotension As sedation has been decreased his pressor requirements have also disappeared. No longer requiring norepinephrine infusion.  History of cardiac pacemaker History of stroke CAD, status post PCI -Per TRH  Hyperglycemia -Sliding scale and CBGs per TRH  Hyperkalemia -Kayexalate per TRH  Best practice:  Diet: regular diet Pain/Anxiety/Delirium protocol (if indicated): n/a VAP protocol (if indicated): na DVT prophylaxis: lovenox GI prophylaxis: pantoprazole Glucose control: SSI Mobility: bed rest Code Status: Full Family Communication: Per TRH Disposition: move to PCU today   Labs   CBC: Recent Labs  Lab 02/24/19 0555 02/26/19 0520  02/26/19 2203 02/27/19 0101 02/27/19 0459 02/27/19 0542 02/28/19 0544  WBC 17.5* 23.3*  --   --   --   --  33.2* 18.7*  HGB 17.1* 16.9   < > 19.4* 17.7* 17.3* 15.3 14.0  HCT 51.5 51.7   < > 57.0* 52.0 51.0 49.9 44.8  MCV 91.8 93.7  --   --   --   --  97.7 98.2  PLT 147* 120*  --   --   --   --  108* 72*   < > = values in this interval not displayed.    Basic Metabolic Panel: Recent Labs  Lab 02/24/19 0555 02/26/19 0520  02/26/19 1955  02/27/19 0459 02/27/19 0542 02/27/19 1700 02/27/19 2315 02/28/19 0544  NA 139 138   < >  --    < > 133* 140 138 140 138  K 4.5 5.0   < >  --    < > 6.3* 5.7* 6.0* 5.6* 5.3*  CL 98 99  --   --   --   --  102 101 105 101  CO2 30 29  --   --   --   --  29 30 30  32  GLUCOSE 109* 100*  --   --   --   --  132* 209* 132* 161*  BUN 33* 30*  --   --   --   --  33* 35* 41* 43*  CREATININE 1.14 1.11  --   --   --   --  1.51* 1.32* 1.17 1.17  CALCIUM 8.6* 8.4*  --   --   --   --  7.4* 8.0* 7.6* 7.9*  MG 2.4  --   --  2.5*  --   --  2.3  --   --   --   PHOS  --   --   --  3.6  --   --  6.5*  --   --   --    < > = values in this interval not displayed.   GFR: Estimated  Creatinine Clearance: 61.7 mL/min (by C-G formula based on SCr of 1.17 mg/dL). Recent Labs  Lab 02/24/19 0555 02/26/19 0520 02/26/19 1955 02/27/19 0542 02/28/19 0544  PROCALCITON <0.10  --  0.33  --   --   WBC 17.5* 23.3*  --  33.2* 18.7*    Liver Function Tests: Recent Labs  Lab 02/22/19 0500 02/23/19 0529 02/24/19 0555 02/28/19  0544  AST 36 37 44* 27  ALT 49* 57* 70* 49*  ALKPHOS 55 65 65 64  BILITOT 1.4* 1.5* 1.8* 1.2  PROT 5.2* 5.5* 5.8* 4.9*  ALBUMIN 2.9* 3.1* 3.3* 2.8*   No results for input(s): LIPASE, AMYLASE in the last 168 hours. No results for input(s): AMMONIA in the last 168 hours.  ABG    Component Value Date/Time   PHART 7.213 (L) 02/27/2019 0459   PCO2ART 87.0 (HH) 02/27/2019 0459   PO2ART 95.0 02/27/2019 0459   HCO3 34.9 (H) 02/27/2019 0459   TCO2 37 (H) 02/27/2019 0459   O2SAT 95.0 02/27/2019 0459     Coagulation Profile: No results for input(s): INR, PROTIME in the last 168 hours.  Cardiac Enzymes: No results for input(s): CKTOTAL, CKMB, CKMBINDEX, TROPONINI in the last 168 hours.  HbA1C: Hemoglobin A1C  Date/Time Value Ref Range Status  09/22/2012 02:49 AM 5.9 4.2 - 6.3 % Final    Comment:    The American Diabetes Association recommends that a primary goal of therapy should be <7% and that physicians should reevaluate the treatment regimen in patients with HbA1c values consistently >8%.     CBG: Recent Labs  Lab 02/27/19 1640 02/27/19 1933 02/27/19 2315 02/28/19 0443 02/28/19 0749  GLUCAP 198* 173* 127* 152* 159*    This patient is critically ill with multiple organ system failure; which, requires frequent high complexity decision making, assessment, support, evaluation, and titration of therapies. This was completed through the application of advanced monitoring technologies and extensive interpretation of multiple databases. During this encounter critical care time was devoted to patient care services described in this note for  34 minutes.  Garner Nash, DO [facility] 02/28/2019 8:55 AM  Personal pager: [phone] If unanswered, please page CCM On-call: [phone]

## 2019-03-01 ENCOUNTER — Inpatient Hospital Stay (HOSPITAL_COMMUNITY): Payer: Medicare Other

## 2019-03-01 DIAGNOSIS — R579 Shock, unspecified: Secondary | ICD-10-CM

## 2019-03-01 LAB — POCT I-STAT 7, (LYTES, BLD GAS, ICA,H+H)
Acid-Base Excess: 9 mmol/L — ABNORMAL HIGH (ref 0.0–2.0)
Acid-Base Excess: 6 mmol/L — ABNORMAL HIGH (ref 0.0–2.0)
Bicarbonate: 40 mmol/L — ABNORMAL HIGH (ref 20.0–28.0)
Bicarbonate: 36 mmol/L — ABNORMAL HIGH (ref 20.0–28.0)
Calcium, Ion: 1.23 mmol/L (ref 1.15–1.40)
Calcium, Ion: 1.23 mmol/L (ref 1.15–1.40)
HCT: 44 % (ref 39.0–52.0)
HCT: 43 % (ref 39.0–52.0)
Hemoglobin: 15 g/dL (ref 13.0–17.0)
Hemoglobin: 14.6 g/dL (ref 13.0–17.0)
O2 Saturation: 88 %
O2 Saturation: 83 %
Patient temperature: 98.6
Patient temperature: 100
Potassium: 5.7 mmol/L — ABNORMAL HIGH (ref 3.5–5.1)
Potassium: 5.3 mmol/L — ABNORMAL HIGH (ref 3.5–5.1)
Sodium: 141 mmol/L (ref 135–145)
Sodium: 143 mmol/L (ref 135–145)
TCO2: 43 mmol/L — ABNORMAL HIGH (ref 22–32)
TCO2: 38 mmol/L — ABNORMAL HIGH (ref 22–32)
pCO2 arterial: 85.9 mmHg (ref 32.0–48.0)
pCO2 arterial: 77 mmHg (ref 32.0–48.0)
pH, Arterial: 7.276 — ABNORMAL LOW (ref 7.350–7.450)
pH, Arterial: 7.281 — ABNORMAL LOW (ref 7.350–7.450)
pO2, Arterial: 65 mmHg — ABNORMAL LOW (ref 83.0–108.0)
pO2, Arterial: 58 mmHg — ABNORMAL LOW (ref 83.0–108.0)

## 2019-03-01 LAB — COMPREHENSIVE METABOLIC PANEL
ALT: 46 U/L — ABNORMAL HIGH (ref 0–44)
AST: 49 U/L — ABNORMAL HIGH (ref 15–41)
Albumin: 2.9 g/dL — ABNORMAL LOW (ref 3.5–5.0)
Alkaline Phosphatase: 53 U/L (ref 38–126)
Anion gap: 7 (ref 5–15)
BUN: 52 mg/dL — ABNORMAL HIGH (ref 8–23)
CO2: 35 mmol/L — ABNORMAL HIGH (ref 22–32)
Calcium: 8 mg/dL — ABNORMAL LOW (ref 8.9–10.3)
Chloride: 101 mmol/L (ref 98–111)
Creatinine, Ser: 1.12 mg/dL (ref 0.61–1.24)
GFR calc Af Amer: >60 mL/min (ref >60)
GFR calc non Af Amer: >60 mL/min (ref >60)
Glucose, Bld: 139 mg/dL — ABNORMAL HIGH (ref 70–99)
Potassium: 5.4 mmol/L — ABNORMAL HIGH (ref 3.5–5.1)
Sodium: 143 mmol/L (ref 135–145)
Total Bilirubin: 1.6 mg/dL — ABNORMAL HIGH (ref 0.3–1.2)
Total Protein: 5.1 g/dL — ABNORMAL LOW (ref 6.5–8.1)

## 2019-03-01 LAB — MRSA PCR SCREENING: MRSA by PCR: NEGATIVE

## 2019-03-01 LAB — URINALYSIS, ROUTINE W REFLEX MICROSCOPIC
Bacteria, UA: NONE SEEN
Bilirubin Urine: NEGATIVE
Glucose, UA: NEGATIVE mg/dL
Ketones, ur: NEGATIVE mg/dL
Nitrite: POSITIVE — AB
Protein, ur: NEGATIVE mg/dL
Specific Gravity, Urine: 1.024 (ref 1.005–1.030)
pH: 5 (ref 5.0–8.0)

## 2019-03-01 LAB — CBC
HCT: 46.4 % (ref 39.0–52.0)
Hemoglobin: 14 g/dL (ref 13.0–17.0)
MCH: 30.2 pg (ref 26.0–34.0)
MCHC: 30.2 g/dL (ref 30.0–36.0)
MCV: 100.2 fL — ABNORMAL HIGH (ref 80.0–100.0)
Platelets: 67 10*3/uL — ABNORMAL LOW (ref 150–400)
RBC: 4.63 MIL/uL (ref 4.22–5.81)
RDW: 16.1 % — ABNORMAL HIGH (ref 11.5–15.5)
WBC: 22.8 10*3/uL — ABNORMAL HIGH (ref 4.0–10.5)
nRBC: 0 % (ref 0.0–0.2)

## 2019-03-01 LAB — GLUCOSE, CAPILLARY
Glucose-Capillary: 127 mg/dL — ABNORMAL HIGH (ref 70–99)
Glucose-Capillary: 139 mg/dL — ABNORMAL HIGH (ref 70–99)
Glucose-Capillary: 150 mg/dL — ABNORMAL HIGH (ref 70–99)
Glucose-Capillary: 170 mg/dL — ABNORMAL HIGH (ref 70–99)
Glucose-Capillary: 171 mg/dL — ABNORMAL HIGH (ref 70–99)
Glucose-Capillary: 173 mg/dL — ABNORMAL HIGH (ref 70–99)

## 2019-03-01 LAB — PROCALCITONIN: Procalcitonin: 0.32 ng/mL

## 2019-03-01 MED ORDER — SODIUM CHLORIDE 0.9 % IV SOLN
2.0000 g | Freq: Three times a day (TID) | INTRAVENOUS | Status: AC
  Administered 2019-03-01 – 2019-03-07 (×20): 2 g via INTRAVENOUS
  Filled 2019-03-01 (×20): qty 2

## 2019-03-01 MED ORDER — MIDAZOLAM BOLUS VIA INFUSION
2.0000 mg | INTRAVENOUS | Status: DC | PRN
  Administered 2019-03-01 – 2019-03-05 (×17): 2 mg via INTRAVENOUS
  Filled 2019-03-01: qty 2

## 2019-03-01 MED ORDER — VANCOMYCIN HCL 10 G IV SOLR
2000.0000 mg | Freq: Once | INTRAVENOUS | Status: AC
  Administered 2019-03-01: 2000 mg via INTRAVENOUS
  Filled 2019-03-01: qty 2000

## 2019-03-01 MED ORDER — VANCOMYCIN HCL 10 G IV SOLR
1250.0000 mg | INTRAVENOUS | Status: DC
  Administered 2019-03-02: 1250 mg via INTRAVENOUS
  Filled 2019-03-01: qty 1250

## 2019-03-01 MED ORDER — FENTANYL BOLUS VIA INFUSION
50.0000 ug | INTRAVENOUS | Status: DC | PRN
  Administered 2019-03-01: 100 ug via INTRAVENOUS
  Administered 2019-03-01 (×2): 50 ug via INTRAVENOUS
  Administered 2019-03-01 (×2): 100 ug via INTRAVENOUS
  Administered 2019-03-02: 50 ug via INTRAVENOUS
  Administered 2019-03-02: 100 ug via INTRAVENOUS
  Administered 2019-03-02 (×3): 50 ug via INTRAVENOUS
  Administered 2019-03-03 – 2019-03-04 (×3): 100 ug via INTRAVENOUS
  Administered 2019-03-04: 50 ug via INTRAVENOUS
  Administered 2019-03-04: 16:00:00 100 ug via INTRAVENOUS
  Administered 2019-03-04: 50 ug via INTRAVENOUS
  Administered 2019-03-04: 17:00:00 100 ug via INTRAVENOUS
  Administered 2019-03-04: 50 ug via INTRAVENOUS
  Administered 2019-03-04: 100 ug via INTRAVENOUS
  Administered 2019-03-05: 50 ug via INTRAVENOUS
  Filled 2019-03-01: qty 200

## 2019-03-01 MED ORDER — DEXMEDETOMIDINE HCL IN NACL 400 MCG/100ML IV SOLN
0.0000 ug/kg/h | INTRAVENOUS | Status: AC
  Administered 2019-03-01 – 2019-03-02 (×8): 1.2 ug/kg/h via INTRAVENOUS
  Administered 2019-03-03: 1 ug/kg/h via INTRAVENOUS
  Administered 2019-03-03: 1.2 ug/kg/h via INTRAVENOUS
  Administered 2019-03-03 (×3): 1 ug/kg/h via INTRAVENOUS
  Administered 2019-03-04 (×5): 1.1 ug/kg/h via INTRAVENOUS
  Administered 2019-03-04 (×2): 1 ug/kg/h via INTRAVENOUS
  Administered 2019-03-05: 21:00:00 0.757 ug/kg/h via INTRAVENOUS
  Administered 2019-03-05 (×4): 1.1 ug/kg/h via INTRAVENOUS
  Administered 2019-03-06: 1.2 ug/kg/h via INTRAVENOUS
  Administered 2019-03-06 (×3): 1 ug/kg/h via INTRAVENOUS
  Administered 2019-03-06 (×2): 1.2 ug/kg/h via INTRAVENOUS
  Administered 2019-03-06 – 2019-03-07 (×2): 1 ug/kg/h via INTRAVENOUS
  Administered 2019-03-07: 1.2 ug/kg/h via INTRAVENOUS
  Administered 2019-03-07: 0.7 ug/kg/h via INTRAVENOUS
  Administered 2019-03-07 (×2): 1.2 ug/kg/h via INTRAVENOUS
  Filled 2019-03-01 (×23): qty 100
  Filled 2019-03-01: qty 300
  Filled 2019-03-01 (×2): qty 100
  Filled 2019-03-01: qty 300
  Filled 2019-03-01 (×7): qty 100

## 2019-03-01 NOTE — Progress Notes (Signed)
[place] Progress Note Patient Name: Clayton Sawyer DOB: 02-23-1941 MRN: [id number]   Date of Service  03/01/2019  HPI/Events of Note  Request to renew expiring Fentanyl and Precedex IV infusions.  eICU Interventions  Will renew expiring Fentanyl and Precedex IV infusions.      Intervention Category Major Interventions: Other:  Lysle Dingwall 03/01/2019, 10:19 PM

## 2019-03-01 NOTE — Progress Notes (Signed)
NAME:  Clayton Sawyer, MRN:  [id number], DOB:  Dec 21, 1940, LOS: 48 ADMISSION DATE:  02/06/2019, CONSULTATION DATE:  February 13 2019 REFERRING MD:  Dr. Marcille Blanco, CHIEF COMPLAINT:  Cough   Brief History   78 year old male admitted on February 13, 2019 in the setting of severe acute respiratory failure with hypoxemia from SARS-CoV-2.  Past Medical History  Coronary artery disease Diastolic heart failure Hypertension CVA in past Hyperlipidemia History of prostate cancer Obstructive sleep apnea  Significant Hospital Events   7/15 Admit, transfer to [facility] 7/16 enrolled in fibrinogen research drug  7/27 PCCM consulted again for worsening oxygenation, pneumothorax on right 7/28 transferred to the intensive care unit, full code, intubated, right-sided chest tube  Consults:  Pulmonary and critical care medicine  Procedures:  ETT 7/28 CVC 7/28 Pigtail R 7/28  Significant Diagnostic Tests:    Micro Data:  7/10, 7/15 SARS-CoV-2 positive 7/16 blood > coag neg staph in 1/4  Antimicrobials:  Ceftriaxone July 14 Azithromycin July 15 Remdesivir July 15 > July 19 Actemra July 15, July 19 Vancomycin July 18, July 19 Solumedrol July 14 Pamrevlumab vs placebo, next scheduled infusion July 29  Interim history/subjective:   Patient remains critically ill in the intensive care unit.  Fevers overnight  Objective   Blood pressure (!) 99/50, pulse (!) 109, temperature 100.2 F (37.9 C), temperature source Oral, resp. rate 16, height 5\' 9"  (1.753 m), weight 106.1 kg, SpO2 93 %.    Vent Mode: PRVC FiO2 (%):  [50 %-100 %] 100 % Set Rate:  [19 bmp-32 bmp] 32 bmp Vt Set:  [420 mL-575 mL] 420 mL PEEP:  [5 cmH20-14 cmH20] 14 cmH20 Pressure Support:  [8 cmH20] 8 cmH20 Plateau Pressure:  [28 cmH20-32 cmH20] 30 cmH20   Intake/Output Summary (Last 24 hours) at 03/01/2019 0845 Last data filed at 03/01/2019 0700 Gross per 24 hour  Intake 2905.29 ml  Output 1415 ml  Net 1490.29 ml   Filed Weights   02/27/19 0438 02/28/19 0500 03/01/19 0500  Weight: 101.6 kg 103.4 kg 106.1 kg    Examination:  General: Intubated, sedated unresponsive on mechanical life support HENT: NCAT, sclera clear, trachea midline endotracheal tube in place PULM: Bilateral ventilated breath sounds CV: Regular rate and rhythm, S1-S2 GI: Bowel sounds present, nontender nondistended MSK: Normal bulk and tone Extremities: No significant edema Neuro: Intubated sedated on mechanical ventilation, is responsive to painful withdrawal.  With sedation lightened opens eyes and is not redirectable becomes agitated very quickly  CXR: 03/01/2019: No significant airspace opacities as compared to previous imaging. Pneumothorax has resolved.  Pigtail in place. The patient's images have been independently reviewed by me.    Resolved Hospital Problem list     Assessment & Plan:   Severe acute hypoxemic respiratory failure in setting of COVID-19 pneumonia:  Right-sided pneumothorax status post 14 French pigtail placement  Overall had a slight setback last night requiring increased FiO2 requirements and PEEP.  Continue mechanical ventilation per ARDS protocol Target TVol 6-8cc/kgIBW Target Plateau Pressure < 30cm H20 Target driving pressure less than 15 cm of water Target PaO2 55-65: titrate PEEP/FiO2 per protocol As long as PaO2 to FiO2 ratio is less than 1:150 position in prone position for 16 hours a day Check CVP daily if CVL in place Target CVP less than 4, diurese as necessary Ventilator associated pneumonia prevention protocol  Chest tube remains in place no air leak Continue -20 cm of water suction Post pigtail catheter order set in place for flushing and  dressing changes.  Sedation needs while on mechanical ventilation. Continue Precedex Continue slow the to decrease rates of opiate and benzodiazepine infusions Goal RA SS at approximately -2 PRN's available for fentanyl and Versed. Versed drip cut off this  morning to see if we can get him to wake up a little bit more  Shock, hypotension No longer requiring vasopressors Continue observe  History of cardiac pacemaker History of stroke CAD, status post PCI Per TRH  Hyperglycemia Sliding scale and CBGs per TRH  Hyperkalemia, resolved Per TRH  Best practice:  Diet: regular diet Pain/Anxiety/Delirium protocol (if indicated): n/a VAP protocol (if indicated): na DVT prophylaxis: lovenox GI prophylaxis: pantoprazole Glucose control: SSI Mobility: bed rest Code Status: Full Family Communication: Per TRH Disposition: move to PCU today   Labs   CBC: Recent Labs  Lab 02/24/19 0555 02/26/19 0520  02/27/19 0542 02/28/19 0544 02/28/19 2256 03/01/19 0411 03/01/19 0450  WBC 17.5* 23.3*  --  33.2* 18.7*  --   --  22.8*  HGB 17.1* 16.9   < > 15.3 14.0 15.0 15.0 14.0  HCT 51.5 51.7   < > 49.9 44.8 44.0 44.0 46.4  MCV 91.8 93.7  --  97.7 98.2  --   --  100.2*  PLT 147* 120*  --  108* 72*  --   --  67*   < > = values in this interval not displayed.    Basic Metabolic Panel: Recent Labs  Lab 02/24/19 0555  02/26/19 1955  02/27/19 0542 02/27/19 1700 02/27/19 2315 02/28/19 0544 02/28/19 2256 03/01/19 0411 03/01/19 0450  NA 139   < >  --    < > 140 138 140 138 142 141 143  K 4.5   < >  --    < > 5.7* 6.0* 5.6* 5.3* 4.7 5.7* 5.4*  CL 98   < >  --   --  102 101 105 101  --   --  101  CO2 30   < >  --   --  29 30 30  32  --   --  35*  GLUCOSE 109*   < >  --   --  132* 209* 132* 161*  --   --  139*  BUN 33*   < >  --   --  33* 35* 41* 43*  --   --  52*  CREATININE 1.14   < >  --   --  1.51* 1.32* 1.17 1.17  --   --  1.12  CALCIUM 8.6*   < >  --   --  7.4* 8.0* 7.6* 7.9*  --   --  8.0*  MG 2.4  --  2.5*  --  2.3  --   --   --   --   --   --   PHOS  --   --  3.6  --  6.5*  --   --   --   --   --   --    < > = values in this interval not displayed.   GFR: Estimated Creatinine Clearance: 65.3 mL/min (by C-G formula based on SCr of  1.12 mg/dL). Recent Labs  Lab 02/24/19 0555 02/26/19 0520 02/26/19 1955 02/27/19 0542 02/28/19 0544 03/01/19 0450  PROCALCITON <0.10  --  0.33  --   --   --   WBC 17.5* 23.3*  --  33.2* 18.7* 22.8*    Liver Function Tests: Recent Labs  Lab 02/23/19  0165 02/24/19 0555 02/28/19 0544 03/01/19 0450  AST 37 44* 27 49*  ALT 57* 70* 49* 46*  ALKPHOS 65 65 64 53  BILITOT 1.5* 1.8* 1.2 1.6*  PROT 5.5* 5.8* 4.9* 5.1*  ALBUMIN 3.1* 3.3* 2.8* 2.9*   No results for input(s): LIPASE, AMYLASE in the last 168 hours. No results for input(s): AMMONIA in the last 168 hours.  ABG    Component Value Date/Time   PHART 7.276 (L) 03/01/2019 0411   PCO2ART 85.9 (HH) 03/01/2019 0411   PO2ART 65.0 (L) 03/01/2019 0411   HCO3 40.0 (H) 03/01/2019 0411   TCO2 43 (H) 03/01/2019 0411   O2SAT 88.0 03/01/2019 0411     Coagulation Profile: No results for input(s): INR, PROTIME in the last 168 hours.  Cardiac Enzymes: No results for input(s): CKTOTAL, CKMB, CKMBINDEX, TROPONINI in the last 168 hours.  HbA1C: Hemoglobin A1C  Date/Time Value Ref Range Status  09/22/2012 02:49 AM 5.9 4.2 - 6.3 % Final    Comment:    The American Diabetes Association recommends that a primary goal of therapy should be <7% and that physicians should reevaluate the treatment regimen in patients with HbA1c values consistently >8%.     CBG: Recent Labs  Lab 02/28/19 1600 02/28/19 1935 03/01/19 0015 03/01/19 0438 03/01/19 0750  GLUCAP 180* 152* 173* 127* 139*    This patient is critically ill with multiple organ system failure; which, requires frequent high complexity decision making, assessment, support, evaluation, and titration of therapies. This was completed through the application of advanced monitoring technologies and extensive interpretation of multiple databases. During this encounter critical care time was devoted to patient care services described in this note for 32 minutes.  Garner Nash, DO  [facility] 03/01/2019 8:45 AM  Personal pager: [phone] If unanswered, please page CCM On-call: [phone]

## 2019-03-01 NOTE — Progress Notes (Signed)
Pharmacy Antibiotic Note  Clayton Sawyer is a 78 y.o. male admitted on 02/17/2019 withCOVID-19 pneumonia.  Now with sepsis of unknown source, WBC up to 22 (on steroids), febrile Tm 101.2, and PCT 0.32.  Previous cultures negative.  Pharmacy has been consulted for vancomycin and Cefepime dosing.  Plan: Cefepime 2g IV q8h Vancomycin 2000 mg IV x1 then 1250 IV q24h qh. Measure Vanc peak and trough at steady state. Goal AUC = 400 - 550.  Expected AUC 476 using SCr 1.12 and Vd coef 0.5 Follow up renal function, culture results, and clinical course.   Height: 5\' 9"  (175.3 cm) Weight: 233 lb 12.8 oz (106.1 kg) IBW/kg (Calculated) : 70.7  Temp (24hrs), Avg:100.2 F (37.9 C), Min:99.5 F (37.5 C), Max:101.2 F (38.4 C)  Recent Labs  Lab 02/24/19 0555 02/26/19 0520 02/27/19 0542 02/27/19 1700 02/27/19 2315 02/28/19 0544 03/01/19 0450  WBC 17.5* 23.3* 33.2*  --   --  18.7* 22.8*  CREATININE 1.14 1.11 1.51* 1.32* 1.17 1.17 1.12    Estimated Creatinine Clearance: 65.3 mL/min (by C-G formula based on SCr of 1.12 mg/dL).    Allergies  Allergen Reactions  . Lisinopril Cough  . Losartan Rash  . Statins Other (See Comments)    Body aches    Antimicrobials this admission: 7/31 Vancomycin >> 7/31 Cefepime >>  Dose adjustments this admission:   Microbiology results: 7/15 SARS Coronavirus 2: positive 7/15 BCx [facility]): 1/4 bottles CONS +mec A (likely contaminant) 7/15 MRSA PCR: neg 7/16 BCx: 1/4 bottles staph hominis (likely contaminant)  Thank you for allowing pharmacy to be a part of this patient's care.  Gretta Arab PharmD, BCPS Clinical pharmacist phone 7am- 5pm: [phone] 03/01/2019 11:42 AM

## 2019-03-01 NOTE — Progress Notes (Signed)
PROGRESS NOTE  Clayton Sawyer IPJ:[id number] DOB: 1940/09/11 DOA: 02/22/2019  PCP: Jodi Marble, MD  Brief History/Interval Summary: 78 year old male with hypertension, prostate cancer, prior CVA who was admitted to the hospital 02/26/2019 from [facility] with profound hypoxic respiratory failure in the setting of COVID-19, and admitted to the ICU requiring heated high flow nasal cannula. Patient was transferred to the floor on 7/23 as he wished to get out of the ICU and met criteria for the floor requiring 15 L high flow nasal cannula.  On the floor patient continued to have episodes of anxiety.  He had a lot of trouble maintaining his oxygen saturations.  This continued for a few days and then finally on 7/28 he got worse.  Multiple discussions held with patient as well as family members.  He was finally intubated and transferred to the ICU.  Reason for Visit: Acute respiratory disease due to COVID-19  Consultants: Pulmonology  Procedures:  Intubation 7/28 Chest tube placement 7/28  Antibiotics: Anti-infectives (From admission, onward)   Start     Dose/Rate Route Frequency Ordered Stop   03/02/19 1400  vancomycin (VANCOCIN) 1,250 mg in sodium chloride 0.9 % 250 mL IVPB     1,250 mg 166.7 mL/hr over 90 Minutes Intravenous Every 24 hours 03/01/19 1556     03/01/19 1200  ceFEPIme (MAXIPIME) 2 g in sodium chloride 0.9 % 100 mL IVPB     2 g 200 mL/hr over 30 Minutes Intravenous Every 8 hours 03/01/19 1142     03/01/19 1200  vancomycin (VANCOCIN) 2,000 mg in sodium chloride 0.9 % 500 mL IVPB     2,000 mg 250 mL/hr over 120 Minutes Intravenous  Once 03/01/19 1142 03/01/19 1425   02/17/19 1000  vancomycin (VANCOCIN) 1,500 mg in sodium chloride 0.9 % 500 mL IVPB  Status:  Discontinued     1,500 mg 250 mL/hr over 120 Minutes Intravenous Every 24 hours 02/16/19 0934 02/17/19 1338   02/16/19 1000  vancomycin (VANCOCIN) 2,000 mg in sodium chloride 0.9 % 500 mL IVPB     2,000 mg 250 mL/hr  over 120 Minutes Intravenous  Once 02/16/19 0934 02/16/19 1250   02/14/19 0700  remdesivir 100 mg in sodium chloride 0.9 % 250 mL IVPB     100 mg 500 mL/hr over 30 Minutes Intravenous Every 24 hours 02/14/19 0010 02/17/19 1625       Subjective/Interval History: Patient had fevers overnight.  Had increasing oxygen requirement overnight.  Became agitated and required more sedation.    Assessment/Plan:  Acute Hypoxic Resp. Failure due to Acute Covid 19 Viral Illness  Vent Mode: PRVC FiO2 (%):  [60 %-100 %] 60 % Set Rate:  [32 bmp] 32 bmp Vt Set:  [420 mL] 420 mL PEEP:  [5 cmH20-14 cmH20] 12 cmH20 Pressure Support:  [8 cmH20] 8 cmH20 Plateau Pressure:  [28 cmH20-32 cmH20] 28 cmH20     Component Value Date/Time   PHART 7.281 (L) 03/01/2019 1400   PCO2ART 77.0 (HH) 03/01/2019 1400   PO2ART 58.0 (L) 03/01/2019 1400   HCO3 36.0 (H) 03/01/2019 1400   TCO2 38 (H) 03/01/2019 1400   O2SAT 83.0 03/01/2019 1400    COVID-19 Labs  Recent Labs    02/27/19 0542 02/27/19 0543  DDIMER 1.61*  --   FERRITIN  --  179  CRP  --  <0.8    Lab Results  Component Value Date   SARSCOV2NAA POSITIVE (A) 02/28/2019   SARSCOV2NAA Detected (A) 02/08/2019  Fever: Remains afebrile Oxygen requirements: Mechanical ventilation.  60% FiO2.  Saturating in the high 80s Antibacterials: Vancomycin and cefepime started 7/31 Remdesivir: Completed course Steroids: On Solu-Medrol 60 mg every 12 hours Diuretics: Not on scheduled diuretics Actemra: He was given 2 doses Vitamin C and Zinc: Continue DVT Prophylaxis:  SCDs  Research Studies: He was enrolled into the fibrinogen research study by Dr. Chase Caller  Patient now intubated.  Remains on mechanical ventilation.  Pulmonology is following and managing.  He has completed treatment for COVID-19.  He is still on steroids however.  Inflammatory markers have been unremarkable for the last several days.  Leukocytosis is possibly due to steroids.   Pneumothorax Has a chest tube.  Management per pulmonology.  Fever Patient continues to have low-grade fevers.  WBC count is trending up.  Procalcitonin mildly elevated at 0.3.  Platelets are down, possibly related to infectious process.  Blood cultures, sputum culture and urinalysis will be repeated.  Start empirically on vancomycin and cefepime.  Check MRSA PCR  Shock Continues on low dose pressors.  Wean down as tolerated.  History of stroke Patient on Plavix which is being continued.  Intolerant to statins.  Hyperglycemia Secondary to steroids.  SSI.  CBGs are reasonably well controlled.  Acute renal failure with hyperkalemia Potassium level is trending down.  Renal function is currently stable with a creatinine at 1.1.  Monitor volume status.  Recheck labs tomorrow.  Monitor urine output.  Currently he has fair urine output.  Bacteremia 1 out of 4 bottles with coag negative staph.  Another bottle with staph hominis.  These are contaminants. Since he is having recurrent fever, blood cultures have been repeated.  Thrombocytopenia Unclear etiology. Possibly related to acute illness. Will hold lovenox and follow platelets for now.  History of coronary artery disease with prior PCI/pacemaker Stable.  Obesity BMI 33.06.  History of prostate cancer Stable.  Essential hypertension Monitor blood pressures closely.  Severe anxiety Likely related to his acute illness.  Nutrition Continue tube feedings.   DVT Prophylaxis: scd PUD Prophylaxis: Protonix Code Status: Full code Family Communication: updated grand daughter 7/31 Disposition Plan: Remain in ICU   Medications:  Scheduled: . chlorhexidine gluconate (MEDLINE KIT)  15 mL Mouth Rinse BID  . Chlorhexidine Gluconate Cloth  6 each Topical Daily  . clopidogrel  75 mg Per Tube Daily  . feeding supplement (PRO-STAT SUGAR FREE 64)  30 mL Per Tube QID  . insulin aspart  0-20 Units Subcutaneous Q4H  . insulin aspart  2  Units Subcutaneous Q4H  . mouth rinse  15 mL Mouth Rinse 10 times per day  . methylPREDNISolone sodium succinate  60 mg Intravenous Q12H  . pantoprazole sodium  40 mg Per Tube Daily  . polyethylene glycol  17 g Per Tube BID  . sodium chloride flush  10-40 mL Intracatheter Q12H  . sodium chloride flush  3 mL Intravenous Q12H  . [START ON 03/13/2019] pamrevlumab or placebo  35 mg/kg Intravenous Once  . traZODone  100 mg Oral QHS   Continuous: . sodium chloride    . ceFEPime (MAXIPIME) IV 2 g (03/01/19 1524)  . dexmedetomidine (PRECEDEX) IV infusion 1.2 mcg/kg/hr (03/01/19 1500)  . dextrose    . feeding supplement (VITAL HIGH PROTEIN) 1,000 mL (03/01/19 0711)  . fentaNYL infusion INTRAVENOUS 150 mcg/hr (03/01/19 1500)  . midazolam 2 mg/hr (03/01/19 1500)  . norepinephrine (LEVOPHED) Adult infusion 3 mcg/min (03/01/19 1500)  . [START ON 03/02/2019] vancomycin  NAT:FTDDUK chloride, acetaminophen **OR** acetaminophen, fentaNYL, guaiFENesin-codeine, guaiFENesin-dextromethorphan, hydrALAZINE, Ipratropium-Albuterol, lip balm, midazolam, [DISCONTINUED] ondansetron **OR** ondansetron (ZOFRAN) IV, phenol, sodium chloride, sodium chloride flush, sodium chloride flush   Objective:  Vital Signs  Vitals:   03/01/19 1200 03/01/19 1300 03/01/19 1400 03/01/19 1533  BP: 114/61 (!) 114/94 (!) 107/55 (!) 102/58  Pulse: (!) 107 (!) 105 (!) 103 90  Resp: (!) 32 (!) 28 (!) 31 (!) 32  Temp: 99.9 F (37.7 C)     TempSrc: Oral     SpO2: (!) 88% (!) 89% (!) 89% (!) 88%  Weight:      Height:        Intake/Output Summary (Last 24 hours) at 03/01/2019 1631 Last data filed at 03/01/2019 1500 Gross per 24 hour  Intake 3372.37 ml  Output 1230 ml  Net 2142.37 ml   Filed Weights   02/27/19 0438 02/28/19 0500 03/01/19 0500  Weight: 101.6 kg 103.4 kg 106.1 kg    General exam: intubated and sedated Respiratory system: Clear to auscultation. Respiratory effort normal. Cardiovascular system:RRR. No  murmurs, rubs, gallops. Gastrointestinal system: Abdomen is nondistended, soft and nontender. No organomegaly or masses felt. Normal bowel sounds heard. Central nervous system: sedated, limited exam Extremities: No C/C/E, +pedal pulses Skin: No rashes, lesions or ulcers Psychiatry: sedated     Lab Results:  Data Reviewed: I have personally reviewed following labs and imaging studies  CBC: Recent Labs  Lab 02/24/19 0555 02/26/19 0520  02/27/19 0542 02/28/19 0544 02/28/19 2256 03/01/19 0411 03/01/19 0450 03/01/19 1400  WBC 17.5* 23.3*  --  33.2* 18.7*  --   --  22.8*  --   HGB 17.1* 16.9   < > 15.3 14.0 15.0 15.0 14.0 14.6  HCT 51.5 51.7   < > 49.9 44.8 44.0 44.0 46.4 43.0  MCV 91.8 93.7  --  97.7 98.2  --   --  100.2*  --   PLT 147* 120*  --  108* 72*  --   --  67*  --    < > = values in this interval not displayed.    Basic Metabolic Panel: Recent Labs  Lab 02/24/19 0555  02/26/19 1955  02/27/19 0542 02/27/19 1700 02/27/19 2315 02/28/19 0544 02/28/19 2256 03/01/19 0411 03/01/19 0450 03/01/19 1400  NA 139   < >  --    < > 140 138 140 138 142 141 143 143  K 4.5   < >  --    < > 5.7* 6.0* 5.6* 5.3* 4.7 5.7* 5.4* 5.3*  CL 98   < >  --   --  102 101 105 101  --   --  101  --   CO2 30   < >  --   --  _0 32  --   --  35*  --   GLUCOSE 109*   < >  --   --  132* 209* 132* 161*  --   --  139*  --   BUN 33*   < >  --   --  33* 35* 41* 43*  --   --  52*  --   CREATININE 1.14   < >  --   --  1.51* 1.32* 1.17 1.17  --   --  1.12  --   CALCIUM 8.6*   < >  --   --  7.4* 8.0* 7.6* 7.9*  --   --  8.0*  --   MG  2.4  --  2.5*  --  2.3  --   --   --   --   --   --   --   PHOS  --   --  3.6  --  6.5*  --   --   --   --   --   --   --    < > = values in this interval not displayed.    GFR: Estimated Creatinine Clearance: 65.3 mL/min (by C-G formula based on SCr of 1.12 mg/dL).  Liver Function Tests: Recent Labs  Lab 02/23/19 0529 02/24/19 0555 02/28/19 0544 03/01/19  0450  AST 37 44* 27 49*  ALT 57* 70* 49* 46*  ALKPHOS 65 65 64 53  BILITOT 1.5* 1.8* 1.2 1.6*  PROT 5.5* 5.8* 4.9* 5.1*  ALBUMIN 3.1* 3.3* 2.8* 2.9*     CBG: Recent Labs  Lab 02/28/19 1935 03/01/19 0015 03/01/19 0438 03/01/19 0750 03/01/19 1135  GLUCAP 152* 173* 127* 139* 170*     Anemia Panel: Recent Labs    02/27/19 0543  FERRITIN 179     Radiology Studies: Dg Chest Port 1 View  Result Date: 03/01/2019 CLINICAL DATA:  Ventilator patient. EXAM: PORTABLE CHEST 1 VIEW COMPARISON:  Radiographs 02/28/2019 and 02/26/2019. FINDINGS: 0822 hours. The endotracheal tube, left arm PICC, feeding tube and right subclavian pacemaker leads appear unchanged. The heart size and mediastinal contours are stable. Right pigtail pleural catheter is stable in position without definite residual pneumothorax. Left-greater-than-right basilar airspace opacities are stable. The bones appear unchanged. IMPRESSION: No significant change in bibasilar airspace opacities and support system. No residual pneumothorax identified. Electronically Signed   By: Richardean Sale M.D.   On: 03/01/2019 09:35   Dg Chest Port 1 View  Result Date: 03/01/2019 CLINICAL DATA:  Hypoxia EXAM: PORTABLE CHEST 1 VIEW COMPARISON:  02/26/2019 FINDINGS: Trace right apical pneumothorax, significantly improved. Indwelling right pigtail chest tube. Multifocal patchy opacities with relative sparing of the left upper lobe, suggesting pneumonia, left lower lobe predominant. No pleural effusion or pneumothorax. The heart is normal in size.  Right chest pacemaker. Endotracheal tube terminates 3 cm above the carina. Left arm PICC terminates at the cavoatrial junction. IMPRESSION: Trace right apical pneumothorax, significantly improved, with indwelling right pigtail chest tube. Endotracheal tube terminates 3 cm above the carina. Additional support apparatus as above. Multifocal pneumonia, left lower lobe predominant. Electronically Signed   By:  Julian Hy M.D.   On: 03/01/2019 00:02    Critical care time: 45mns   LOS: 16 days   [employer] [email or web address]  03/01/2019, 4:31 PM

## 2019-03-01 NOTE — Progress Notes (Signed)
[facility] Progress Note Patient Name: Clayton Sawyer DOB: August 01, 1941 MRN: [id number]   Date of Service  03/01/2019  HPI/Events of Note  ABG on 100%/PRVC 32/TV 420/P 14 = 7.276/85.9/65.0. Current sat by oximetry = 92%. Not much room to increase ventilator settings.   eICU Interventions  Continue present management.      Intervention Category Major Interventions: Acid-Base disturbance - evaluation and management;Respiratory failure - evaluation and management  Sommer,Steven Eugene 03/01/2019, 5:07 AM

## 2019-03-01 NOTE — Progress Notes (Signed)
Pt's wife callled to unit. Updated of pt condition and plan of care. Pt's wife appreciative of update.

## 2019-03-02 LAB — GLUCOSE, CAPILLARY
Glucose-Capillary: 122 mg/dL — ABNORMAL HIGH (ref 70–99)
Glucose-Capillary: 131 mg/dL — ABNORMAL HIGH (ref 70–99)
Glucose-Capillary: 146 mg/dL — ABNORMAL HIGH (ref 70–99)
Glucose-Capillary: 148 mg/dL — ABNORMAL HIGH (ref 70–99)
Glucose-Capillary: 158 mg/dL — ABNORMAL HIGH (ref 70–99)
Glucose-Capillary: 189 mg/dL — ABNORMAL HIGH (ref 70–99)

## 2019-03-02 LAB — PROCALCITONIN: Procalcitonin: 0.2 ng/mL

## 2019-03-02 LAB — COMPREHENSIVE METABOLIC PANEL
ALT: 35 U/L (ref 0–44)
AST: 30 U/L (ref 15–41)
Albumin: 2.4 g/dL — ABNORMAL LOW (ref 3.5–5.0)
Alkaline Phosphatase: 48 U/L (ref 38–126)
Anion gap: 4 — ABNORMAL LOW (ref 5–15)
BUN: 48 mg/dL — ABNORMAL HIGH (ref 8–23)
CO2: 34 mmol/L — ABNORMAL HIGH (ref 22–32)
Calcium: 7.3 mg/dL — ABNORMAL LOW (ref 8.9–10.3)
Chloride: 108 mmol/L (ref 98–111)
Creatinine, Ser: 0.94 mg/dL (ref 0.61–1.24)
GFR calc Af Amer: >60 mL/min (ref >60)
GFR calc non Af Amer: >60 mL/min (ref >60)
Glucose, Bld: 166 mg/dL — ABNORMAL HIGH (ref 70–99)
Potassium: 4.9 mmol/L (ref 3.5–5.1)
Sodium: 146 mmol/L — ABNORMAL HIGH (ref 135–145)
Total Bilirubin: 1 mg/dL (ref 0.3–1.2)
Total Protein: 4.4 g/dL — ABNORMAL LOW (ref 6.5–8.1)

## 2019-03-02 LAB — CBC
HCT: 42.2 % (ref 39.0–52.0)
Hemoglobin: 12.7 g/dL — ABNORMAL LOW (ref 13.0–17.0)
MCH: 30.6 pg (ref 26.0–34.0)
MCHC: 30.1 g/dL (ref 30.0–36.0)
MCV: 101.7 fL — ABNORMAL HIGH (ref 80.0–100.0)
Platelets: 65 10*3/uL — ABNORMAL LOW (ref 150–400)
RBC: 4.15 MIL/uL — ABNORMAL LOW (ref 4.22–5.81)
RDW: 16.4 % — ABNORMAL HIGH (ref 11.5–15.5)
WBC: 11.8 10*3/uL — ABNORMAL HIGH (ref 4.0–10.5)
nRBC: 0 % (ref 0.0–0.2)

## 2019-03-02 MED ORDER — HYDROMORPHONE HCL 2 MG PO TABS
2.0000 mg | ORAL_TABLET | Freq: Four times a day (QID) | ORAL | Status: DC
  Administered 2019-03-02 – 2019-03-03 (×4): 2 mg
  Filled 2019-03-02 (×4): qty 1

## 2019-03-02 MED ORDER — CLONAZEPAM 1 MG PO TABS
2.0000 mg | ORAL_TABLET | Freq: Two times a day (BID) | ORAL | Status: DC
  Administered 2019-03-02 – 2019-03-06 (×9): 2 mg
  Filled 2019-03-02 (×4): qty 2
  Filled 2019-03-02 (×2): qty 4
  Filled 2019-03-02 (×5): qty 2

## 2019-03-02 MED ORDER — FLEET ENEMA 7-19 GM/118ML RE ENEM
1.0000 | ENEMA | Freq: Once | RECTAL | Status: AC
  Administered 2019-03-02: 1 via RECTAL
  Filled 2019-03-02: qty 1

## 2019-03-02 NOTE — Progress Notes (Signed)
Pt's wife called to unit. Updated of pt condition and plan of care. Pt's wife appreciative of update.

## 2019-03-02 NOTE — Progress Notes (Signed)
PROGRESS NOTE  Clayton Sawyer HAL:[id number] DOB: 12-Nov-1940 DOA: 02/15/2019  PCP: Jodi Marble, MD  Brief History/Interval Summary: 78 year old male with hypertension, prostate cancer, prior CVA who was admitted to the hospital 02/18/2019 from [facility] with profound hypoxic respiratory failure in the setting of COVID-19, and admitted to the ICU requiring heated high flow nasal cannula. Patient was transferred to the floor on 7/23 as he wished to get out of the ICU and met criteria for the floor requiring 15 L high flow nasal cannula.  On the floor patient continued to have episodes of anxiety.  He had a lot of trouble maintaining his oxygen saturations.  This continued for a few days and then finally on 7/28 he got worse.  Multiple discussions held with patient as well as family members.  He was finally intubated and transferred to the ICU.  Reason for Visit: Acute respiratory disease due to COVID-19  Consultants: Pulmonology  Procedures:  Intubation 7/28 Chest tube placement 7/28  Antibiotics: Anti-infectives (From admission, onward)   Start     Dose/Rate Route Frequency Ordered Stop   03/02/19 1400  vancomycin (VANCOCIN) 1,250 mg in sodium chloride 0.9 % 250 mL IVPB  Status:  Discontinued     1,250 mg 166.7 mL/hr over 90 Minutes Intravenous Every 24 hours 03/01/19 1556 03/02/19 1552   03/01/19 1200  ceFEPIme (MAXIPIME) 2 g in sodium chloride 0.9 % 100 mL IVPB     2 g 200 mL/hr over 30 Minutes Intravenous Every 8 hours 03/01/19 1142     03/01/19 1200  vancomycin (VANCOCIN) 2,000 mg in sodium chloride 0.9 % 500 mL IVPB     2,000 mg 250 mL/hr over 120 Minutes Intravenous  Once 03/01/19 1142 03/01/19 1425   02/17/19 1000  vancomycin (VANCOCIN) 1,500 mg in sodium chloride 0.9 % 500 mL IVPB  Status:  Discontinued     1,500 mg 250 mL/hr over 120 Minutes Intravenous Every 24 hours 02/16/19 0934 02/17/19 1338   02/16/19 1000  vancomycin (VANCOCIN) 2,000 mg in sodium chloride 0.9 % 500  mL IVPB     2,000 mg 250 mL/hr over 120 Minutes Intravenous  Once 02/16/19 0934 02/16/19 1250   02/14/19 0700  remdesivir 100 mg in sodium chloride 0.9 % 250 mL IVPB     100 mg 500 mL/hr over 30 Minutes Intravenous Every 24 hours 02/14/19 0010 02/17/19 1625       Subjective/Interval History: Low-grade fevers overnight.  Increasing oxygen requirements overnight.  Had some difficulty with sedation overnight.  Assessment/Plan:  Acute Hypoxic Resp. Failure due to Acute Covid 19 Viral Illness  Vent Mode: PRVC FiO2 (%):  [60 %-80 %] 70 % Set Rate:  [32 bmp] 32 bmp Vt Set:  [420 mL] 420 mL PEEP:  [12 cmH20-14 cmH20] 14 cmH20 Plateau Pressure:  [25 cmH20-32 cmH20] 32 cmH20     Component Value Date/Time   PHART 7.281 (L) 03/01/2019 1400   PCO2ART 77.0 (HH) 03/01/2019 1400   PO2ART 58.0 (L) 03/01/2019 1400   HCO3 36.0 (H) 03/01/2019 1400   TCO2 38 (H) 03/01/2019 1400   O2SAT 83.0 03/01/2019 1400    COVID-19 Labs  No results for input(s): DDIMER, FERRITIN, LDH, CRP in the last 72 hours.  Lab Results  Component Value Date   SARSCOV2NAA POSITIVE (A) 02/10/2019   SARSCOV2NAA Detected (A) 02/08/2019     Fever: Remains afebrile Oxygen requirements: Mechanical ventilation.  60% FiO2.  Saturating in the high 80s Antibacterials: Vancomycin and cefepime started 7/31  Remdesivir: Completed course Steroids: On Solu-Medrol 60 mg every 12 hours Diuretics: Not on scheduled diuretics Actemra: He was given 2 doses Vitamin C and Zinc: Continue DVT Prophylaxis:  SCDs  Research Studies: He was enrolled into the fibrinogen research study by Dr. Chase Caller  Patient now intubated.  Remains on mechanical ventilation.  Pulmonology is following and managing.  He has completed treatment for COVID-19.  He is still on steroids however.  Inflammatory markers have been unremarkable for the last several days.    Pneumothorax Has a chest tube.  Management per pulmonology.  Fever likely related to  HCAP Patient continues to have low-grade fevers.  WBC count was trending up.  Procalcitonin mildly elevated at 0.3.  Platelets are down, possibly related to infectious process.  Sputum culture positive for gram-negative rods.  Currently on cefepime.  Shock Continues on low dose pressors.  Wean down as tolerated.  History of stroke Patient on Plavix which is being continued.  Intolerant to statins.  Hyperglycemia Secondary to steroids.  SSI.  CBGs are reasonably well controlled.  Acute renal failure with hyperkalemia Potassium level is trending down.  Renal function is currently stable with a creatinine at 1.1.  Monitor volume status.  Recheck labs tomorrow.  Monitor urine output.  Currently he has fair urine output.  Bacteremia 1 out of 4 bottles with coag negative staph.  Another bottle with staph hominis.  These are contaminants. Since he is having recurrent fever, blood cultures have been repeated.  They have not shown any growth as of yet  Thrombocytopenia Unclear etiology. Possibly related to acute illness. Will hold lovenox and follow platelets for now.  Continue to monitor  History of coronary artery disease with prior PCI/pacemaker Stable.  Obesity BMI 33.06.  History of prostate cancer Stable.  Essential hypertension Currently hypotensive, requiring vasopressors.  Severe anxiety Likely related to his acute illness.  Nutrition Continue tube feedings.   DVT Prophylaxis: scd PUD Prophylaxis: Protonix Code Status: Full code Family Communication: updated grand daughter 8/1 Disposition Plan: Remain in ICU   Medications:  Scheduled: . chlorhexidine gluconate (MEDLINE KIT)  15 mL Mouth Rinse BID  . Chlorhexidine Gluconate Cloth  6 each Topical Daily  . clonazePAM  2 mg Per Tube Q12H  . clopidogrel  75 mg Per Tube Daily  . feeding supplement (PRO-STAT SUGAR FREE 64)  30 mL Per Tube QID  . HYDROmorphone  2 mg Per Tube Q6H  . insulin aspart  0-20 Units  Subcutaneous Q4H  . insulin aspart  2 Units Subcutaneous Q4H  . mouth rinse  15 mL Mouth Rinse 10 times per day  . methylPREDNISolone sodium succinate  60 mg Intravenous Q12H  . pantoprazole sodium  40 mg Per Tube Daily  . polyethylene glycol  17 g Per Tube BID  . sodium chloride flush  10-40 mL Intracatheter Q12H  . sodium chloride flush  3 mL Intravenous Q12H  . [START ON 03/13/2019] pamrevlumab or placebo  35 mg/kg Intravenous Once  . traZODone  100 mg Oral QHS   Continuous: . sodium chloride    . ceFEPime (MAXIPIME) IV 2 g (03/02/19 1305)  . dexmedetomidine (PRECEDEX) IV infusion 1.2 mcg/kg/hr (03/02/19 1535)  . dextrose    . feeding supplement (VITAL HIGH PROTEIN) 1,000 mL (03/02/19 1303)  . fentaNYL infusion INTRAVENOUS 150 mcg/hr (03/02/19 1400)  . midazolam 2 mg/hr (03/02/19 1400)  . norepinephrine (LEVOPHED) Adult infusion 1 mcg/min (03/02/19 1400)   NFA:OZHYQM chloride, acetaminophen **OR** acetaminophen, fentaNYL, guaiFENesin-codeine, guaiFENesin-dextromethorphan, hydrALAZINE,  Ipratropium-Albuterol, lip balm, midazolam, [DISCONTINUED] ondansetron **OR** ondansetron (ZOFRAN) IV, phenol, sodium chloride, sodium chloride flush, sodium chloride flush   Objective:  Vital Signs  Vitals:   03/02/19 1300 03/02/19 1400 03/02/19 1500 03/02/19 1537  BP: (!) 109/52 (!) 109/47 (!) 96/54 (!) 103/48  Pulse: (!) 104 (!) 49 (!) 103 67  Resp: 15 (!) 33 (!) 33 (!) 39  Temp:      TempSrc:      SpO2: 91% (!) 88% 93% 93%  Weight:      Height:        Intake/Output Summary (Last 24 hours) at 03/02/2019 1734 Last data filed at 03/02/2019 1400 Gross per 24 hour  Intake 2110.35 ml  Output 1400 ml  Net 710.35 ml   Filed Weights   02/28/19 0500 03/01/19 0500 03/02/19 0451  Weight: 103.4 kg 106.1 kg 106.5 kg    General exam: Intubated and sedated Respiratory system: Clear to auscultation. Respiratory effort normal. Cardiovascular system:RRR. No murmurs, rubs, gallops. Gastrointestinal  system: Abdomen is nondistended, soft and nontender. No organomegaly or masses felt. Normal bowel sounds heard. Central nervous system: Unable to assess due to sedation Extremities: No C/C/E, +pedal pulses Skin: No rashes, lesions or ulcers Psychiatry: Sedated     Lab Results:  Data Reviewed: I have personally reviewed following labs and imaging studies  CBC: Recent Labs  Lab 02/26/19 0520  02/27/19 0542 02/28/19 0544 02/28/19 2256 03/01/19 0411 03/01/19 0450 03/01/19 1400 03/02/19 0449  WBC 23.3*  --  33.2* 18.7*  --   --  22.8*  --  11.8*  HGB 16.9   < > 15.3 14.0 15.0 15.0 14.0 14.6 12.7*  HCT 51.7   < > 49.9 44.8 44.0 44.0 46.4 43.0 42.2  MCV 93.7  --  97.7 98.2  --   --  100.2*  --  101.7*  PLT 120*  --  108* 72*  --   --  67*  --  65*   < > = values in this interval not displayed.    Basic Metabolic Panel: Recent Labs  Lab 02/24/19 0555  02/26/19 1955  02/27/19 0542 02/27/19 1700 02/27/19 2315 02/28/19 0544 02/28/19 2256 03/01/19 0411 03/01/19 0450 03/01/19 1400 03/02/19 0449  NA 139   < >  --    < > 140 138 140 138 142 141 143 143 146*  K 4.5   < >  --    < > 5.7* 6.0* 5.6* 5.3* 4.7 5.7* 5.4* 5.3* 4.9  CL 98   < >  --   --  102 101 105 101  --   --  101  --  108  CO2 30   < >  --   --  _0 32  --   --  35*  --  34*  GLUCOSE 109*   < >  --   --  132* 209* 132* 161*  --   --  139*  --  166*  BUN 33*   < >  --   --  33* 35* 41* 43*  --   --  52*  --  48*  CREATININE 1.14   < >  --   --  1.51* 1.32* 1.17 1.17  --   --  1.12  --  0.94  CALCIUM 8.6*   < >  --   --  7.4* 8.0* 7.6* 7.9*  --   --  8.0*  --  7.3*  MG 2.4  --  2.5*  --  2.3  --   --   --   --   --   --   --   --   PHOS  --   --  3.6  --  6.5*  --   --   --   --   --   --   --   --    < > = values in this interval not displayed.    GFR: Estimated Creatinine Clearance: 77.9 mL/min (by C-G formula based on SCr of 0.94 mg/dL).  Liver Function Tests: Recent Labs  Lab 02/24/19 0555 02/28/19  0544 03/01/19 0450 03/02/19 0449  AST 44* 27 49* 30  ALT 70* 49* 46* 35  ALKPHOS 65 64 53 48  BILITOT 1.8* 1.2 1.6* 1.0  PROT 5.8* 4.9* 5.1* 4.4*  ALBUMIN 3.3* 2.8* 2.9* 2.4*     CBG: Recent Labs  Lab 03/02/19 0038 03/02/19 0317 03/02/19 0802 03/02/19 1147 03/02/19 1554  GLUCAP 189* 148* 131* 122* 146*     Anemia Panel: No results for input(s): VITAMINB12, FOLATE, FERRITIN, TIBC, IRON, RETICCTPCT in the last 72 hours.   Radiology Studies: Dg Chest Port 1 View  Result Date: 03/01/2019 CLINICAL DATA:  Ventilator patient. EXAM: PORTABLE CHEST 1 VIEW COMPARISON:  Radiographs 02/28/2019 and 02/26/2019. FINDINGS: 0822 hours. The endotracheal tube, left arm PICC, feeding tube and right subclavian pacemaker leads appear unchanged. The heart size and mediastinal contours are stable. Right pigtail pleural catheter is stable in position without definite residual pneumothorax. Left-greater-than-right basilar airspace opacities are stable. The bones appear unchanged. IMPRESSION: No significant change in bibasilar airspace opacities and support system. No residual pneumothorax identified. Electronically Signed   By: Richardean Sale M.D.   On: 03/01/2019 09:35   Dg Chest Port 1 View  Result Date: 03/01/2019 CLINICAL DATA:  Hypoxia EXAM: PORTABLE CHEST 1 VIEW COMPARISON:  02/26/2019 FINDINGS: Trace right apical pneumothorax, significantly improved. Indwelling right pigtail chest tube. Multifocal patchy opacities with relative sparing of the left upper lobe, suggesting pneumonia, left lower lobe predominant. No pleural effusion or pneumothorax. The heart is normal in size.  Right chest pacemaker. Endotracheal tube terminates 3 cm above the carina. Left arm PICC terminates at the cavoatrial junction. IMPRESSION: Trace right apical pneumothorax, significantly improved, with indwelling right pigtail chest tube. Endotracheal tube terminates 3 cm above the carina. Additional support apparatus as above.  Multifocal pneumonia, left lower lobe predominant. Electronically Signed   By: Julian Hy M.D.   On: 03/01/2019 00:02    Critical care time: 18mns   LOS: 17 days   [email or web address] on [email or web address]  03/02/2019, 5:34 PM

## 2019-03-02 NOTE — Progress Notes (Signed)
NAME:  Clayton Sawyer, MRN:  [id number], DOB:  June 30, 1941, LOS: 39 ADMISSION DATE:  02/20/2019, CONSULTATION DATE: February 13, 2019 REFERRING MD: Dr. 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 Hyperlipidemia History of prostate cancer Obstructive sleep apnea  Significant Hospital Events   7/15 Admit, transfer to [facility] 7/16 enrolled in fibrinogen research drug  7/27 PCCM consulted again for worsening oxygenation, pneumothorax on right 7/28 transferred to the intensive care unit, full code, intubated, right-sided chest tube 7/31 worsening vent settings, severe hypoxemia, high sedation needs  Consults:  Pulmonary and critical care medicine  Procedures:  ETT 7/28 CVC 7/28 Pigtail R 7/28  Significant Diagnostic Tests:    Micro Data:  7/10, 7/15 SARS-CoV-2 positive 7/16 blood > coag neg staph in 1/4  Antimicrobials/COVID treatment  Ceftriaxone July 14 Azithromycin July 15 Remdesivir July 15 > July 19 Actemra July 15, July 19 Vancomycin July 18, July 19 Solumedrol July 14 Pamrevlumab vs placebo, next scheduled infusion July 29  Interim history/subjective:  Some fever on July 31, remains on high ventilator settings, chest tube remains in place, heavily sedated  Objective   Blood pressure (!) 109/52, pulse (!) 104, temperature 98 F (36.7 C), temperature source Oral, resp. rate 15, height 5\' 9"  (1.753 m), weight 106.5 kg, SpO2 91 %.    Vent Mode: PRVC FiO2 (%):  [60 %-80 %] 80 % Set Rate:  [32 bmp] 32 bmp Vt Set:  [420 mL] 420 mL PEEP:  [12 cmH20-14 cmH20] 14 cmH20 Plateau Pressure:  [25 cmH20-32 cmH20] 32 cmH20   Intake/Output Summary (Last 24 hours) at 03/02/2019 1549 Last data filed at 03/02/2019 1400 Gross per 24 hour  Intake 2350.45 ml  Output 1605 ml  Net 745.45  ml   Filed Weights   02/28/19 0500 03/01/19 0500 03/02/19 0451  Weight: 103.4 kg 106.1 kg 106.5 kg    Examination:  General:  In bed on vent HENT: NCAT ETT in place PULM: CTA B, vent supported breathing CV: RRR, no mgr GI: BS+, soft, nontender MSK: normal bulk and tone Neuro: sedated on vent  July 31 chest x-ray images independently reviewed showing pigtail in place, endotracheal tube in place, bibasilar opacities left greater than right  Resolved Hospital Problem list     Assessment & Plan:  ARDS due to COVID-19 pneumonia, advanced age Right sided pneumothorax status post 41 French pigtail chest tube catheter Continue chest tube to suction, -20 Flushing and dressing orders in place Continue mechanical ventilation per ARDS protocol Target TVol 6-8cc/kgIBW Target Plateau Pressure < 30cm H20 Target driving pressure less than 15 cm of water Target PaO2 55-65: titrate PEEP/FiO2 per protocol As long as PaO2 to FiO2 ratio is less than 1:150 position in prone position for 16 hours a day Check CVP daily if CVL in place Target CVP less than 4, diurese as necessary Ventilator associated pneumonia prevention protocol August 1 plan: Mean PEEP and FiO2 per protocol, high sedation given high ventilator needs  Sedation needs while on mechanical ventilation Continue  midazolam and fentanyl for now Start oral meds: Hydromorphone and clonazepam Wean off Precedex today  Thromboctytopenia Holding blood thinners Monitor for bleeding SCD  Prognosis poor  Best practice:  Diet: tube feeding Pain/Anxiety/Delirium protocol (if indicated): yes VAP protocol (if indicated): yes DVT prophylaxis: SCD GI prophylaxis: Pantoprazole  for stress ulcer prophylaxis Glucose control: per TRH Mobility: bed rest Code Status: full Family Communication: will discuss with TRH Disposition: remain in ICU  Labs   CBC: Recent Labs  Lab 02/26/19 0520  02/27/19 0542 02/28/19 0544 02/28/19 2256  03/01/19 0411 03/01/19 0450 03/01/19 1400 03/02/19 0449  WBC 23.3*  --  33.2* 18.7*  --   --  22.8*  --  11.8*  HGB 16.9   < > 15.3 14.0 15.0 15.0 14.0 14.6 12.7*  HCT 51.7   < > 49.9 44.8 44.0 44.0 46.4 43.0 42.2  MCV 93.7  --  97.7 98.2  --   --  100.2*  --  101.7*  PLT 120*  --  108* 72*  --   --  67*  --  65*   < > = values in this interval not displayed.    Basic Metabolic Panel: Recent Labs  Lab 02/24/19 0555  02/26/19 1955  02/27/19 0542 02/27/19 1700 02/27/19 2315 02/28/19 0544 02/28/19 2256 03/01/19 0411 03/01/19 0450 03/01/19 1400 03/02/19 0449  NA 139   < >  --    < > 140 138 140 138 142 141 143 143 146*  K 4.5   < >  --    < > 5.7* 6.0* 5.6* 5.3* 4.7 5.7* 5.4* 5.3* 4.9  CL 98   < >  --   --  102 101 105 101  --   --  101  --  108  CO2 30   < >  --   --  29 30 30  32  --   --  35*  --  34*  GLUCOSE 109*   < >  --   --  132* 209* 132* 161*  --   --  139*  --  166*  BUN 33*   < >  --   --  33* 35* 41* 43*  --   --  52*  --  48*  CREATININE 1.14   < >  --   --  1.51* 1.32* 1.17 1.17  --   --  1.12  --  0.94  CALCIUM 8.6*   < >  --   --  7.4* 8.0* 7.6* 7.9*  --   --  8.0*  --  7.3*  MG 2.4  --  2.5*  --  2.3  --   --   --   --   --   --   --   --   PHOS  --   --  3.6  --  6.5*  --   --   --   --   --   --   --   --    < > = values in this interval not displayed.   GFR: Estimated Creatinine Clearance: 77.9 mL/min (by C-G formula based on SCr of 0.94 mg/dL). Recent Labs  Lab 02/24/19 0555  02/26/19 1955 02/27/19 0542 02/28/19 0544 03/01/19 0450 03/01/19 0838 03/02/19 0449  PROCALCITON <0.10  --  0.33  --   --   --  0.32 0.20  WBC 17.5*   < >  --  33.2* 18.7* 22.8*  --  11.8*   < > = values in this interval not displayed.    Liver Function Tests: Recent Labs  Lab 02/24/19 0555 02/28/19 0544 03/01/19 0450 03/02/19 0449  AST 44* 27 49* 30  ALT 70* 49* 46* 35  ALKPHOS 65 64 53 48  BILITOT 1.8* 1.2  1.6* 1.0  PROT 5.8* 4.9* 5.1* 4.4*  ALBUMIN 3.3* 2.8*  2.9* 2.4*   No results for input(s): LIPASE, AMYLASE in the last 168 hours. No results for input(s): AMMONIA in the last 168 hours.  ABG    Component Value Date/Time   PHART 7.281 (L) 03/01/2019 1400   PCO2ART 77.0 (HH) 03/01/2019 1400   PO2ART 58.0 (L) 03/01/2019 1400   HCO3 36.0 (H) 03/01/2019 1400   TCO2 38 (H) 03/01/2019 1400   O2SAT 83.0 03/01/2019 1400     Coagulation Profile: No results for input(s): INR, PROTIME in the last 168 hours.  Cardiac Enzymes: No results for input(s): CKTOTAL, CKMB, CKMBINDEX, TROPONINI in the last 168 hours.  HbA1C: Hemoglobin A1C  Date/Time Value Ref Range Status  09/22/2012 02:49 AM 5.9 4.2 - 6.3 % Final    Comment:    The American Diabetes Association recommends that a primary goal of therapy should be <7% and that physicians should reevaluate the treatment regimen in patients with HbA1c values consistently >8%.     CBG: Recent Labs  Lab 03/01/19 1932 03/02/19 0038 03/02/19 0317 03/02/19 0802 03/02/19 1147  GLUCAP 150* 189* 148* 131* 122*     Critical care time: 33 minutes     Roselie Awkward, MD [place] [facility] Pager: [phone] Cell: [phone] If no response, call [phone]

## 2019-03-02 DEATH — deceased

## 2019-03-03 ENCOUNTER — Inpatient Hospital Stay (HOSPITAL_COMMUNITY): Payer: Medicare Other

## 2019-03-03 DIAGNOSIS — J80 Acute respiratory distress syndrome: Secondary | ICD-10-CM

## 2019-03-03 DIAGNOSIS — L8990 Pressure ulcer of unspecified site, unspecified stage: Secondary | ICD-10-CM | POA: Insufficient documentation

## 2019-03-03 LAB — COMPREHENSIVE METABOLIC PANEL
ALT: 33 U/L (ref 0–44)
AST: 28 U/L (ref 15–41)
Albumin: 2.2 g/dL — ABNORMAL LOW (ref 3.5–5.0)
Alkaline Phosphatase: 48 U/L (ref 38–126)
Anion gap: 5 (ref 5–15)
BUN: 51 mg/dL — ABNORMAL HIGH (ref 8–23)
CO2: 34 mmol/L — ABNORMAL HIGH (ref 22–32)
Calcium: 7.6 mg/dL — ABNORMAL LOW (ref 8.9–10.3)
Chloride: 108 mmol/L (ref 98–111)
Creatinine, Ser: 0.98 mg/dL (ref 0.61–1.24)
GFR calc Af Amer: >60 mL/min (ref >60)
GFR calc non Af Amer: >60 mL/min (ref >60)
Glucose, Bld: 163 mg/dL — ABNORMAL HIGH (ref 70–99)
Potassium: 4.8 mmol/L (ref 3.5–5.1)
Sodium: 147 mmol/L — ABNORMAL HIGH (ref 135–145)
Total Bilirubin: 1 mg/dL (ref 0.3–1.2)
Total Protein: 4.3 g/dL — ABNORMAL LOW (ref 6.5–8.1)

## 2019-03-03 LAB — CBC
HCT: 40.3 % (ref 39.0–52.0)
Hemoglobin: 12.2 g/dL — ABNORMAL LOW (ref 13.0–17.0)
MCH: 31.1 pg (ref 26.0–34.0)
MCHC: 30.3 g/dL (ref 30.0–36.0)
MCV: 102.8 fL — ABNORMAL HIGH (ref 80.0–100.0)
Platelets: 61 10*3/uL — ABNORMAL LOW (ref 150–400)
RBC: 3.92 MIL/uL — ABNORMAL LOW (ref 4.22–5.81)
RDW: 16.7 % — ABNORMAL HIGH (ref 11.5–15.5)
WBC: 10.3 10*3/uL (ref 4.0–10.5)
nRBC: 0 % (ref 0.0–0.2)

## 2019-03-03 LAB — GLUCOSE, CAPILLARY
Glucose-Capillary: 134 mg/dL — ABNORMAL HIGH (ref 70–99)
Glucose-Capillary: 138 mg/dL — ABNORMAL HIGH (ref 70–99)
Glucose-Capillary: 145 mg/dL — ABNORMAL HIGH (ref 70–99)
Glucose-Capillary: 148 mg/dL — ABNORMAL HIGH (ref 70–99)
Glucose-Capillary: 148 mg/dL — ABNORMAL HIGH (ref 70–99)
Glucose-Capillary: 155 mg/dL — ABNORMAL HIGH (ref 70–99)

## 2019-03-03 LAB — APTT: aPTT: 44 seconds — ABNORMAL HIGH (ref 24–36)

## 2019-03-03 LAB — PROCALCITONIN: Procalcitonin: 0.16 ng/mL

## 2019-03-03 MED ORDER — SODIUM CHLORIDE 0.9 % IV SOLN
0.2000 mg/kg/h | INTRAVENOUS | Status: DC
  Administered 2019-03-04 – 2019-03-06 (×4): 0.18 mg/kg/h via INTRAVENOUS
  Filled 2019-03-03 (×8): qty 250

## 2019-03-03 MED ORDER — SODIUM CHLORIDE 0.9% FLUSH
3.0000 mL | Freq: Two times a day (BID) | INTRAVENOUS | Status: DC
  Administered 2019-03-04 – 2019-03-16 (×15): 3 mL via INTRAVENOUS

## 2019-03-03 MED ORDER — FLEET ENEMA 7-19 GM/118ML RE ENEM
1.0000 | ENEMA | Freq: Once | RECTAL | Status: AC
  Administered 2019-03-03: 13:00:00 1 via RECTAL
  Filled 2019-03-03: qty 1

## 2019-03-03 MED ORDER — SODIUM CHLORIDE 0.9 % IV SOLN
0.1500 mg/kg/h | INTRAVENOUS | Status: DC
  Administered 2019-03-03: 0.15 mg/kg/h via INTRAVENOUS
  Filled 2019-03-03: qty 250

## 2019-03-03 MED ORDER — OXYCODONE HCL 5 MG PO TABS
5.0000 mg | ORAL_TABLET | Freq: Four times a day (QID) | ORAL | Status: DC
  Administered 2019-03-03 – 2019-03-04 (×4): 5 mg via ORAL
  Filled 2019-03-03 (×4): qty 1

## 2019-03-03 MED ORDER — SODIUM CHLORIDE 0.9% FLUSH: 3.0000 mL | INTRAVENOUS | Status: DC | PRN

## 2019-03-03 MED ORDER — SODIUM CHLORIDE 0.9 % IV SOLN
250.0000 mL | INTRAVENOUS | Status: DC | PRN
  Administered 2019-03-15: 20:00:00 250 mL via INTRAVENOUS

## 2019-03-03 NOTE — Progress Notes (Signed)
PROGRESS NOTE  Clayton Sawyer:[id number] DOB: 14-Jul-1941 DOA: 01/30/2019  PCP: Jodi Marble, MD  Brief History/Interval Summary: 78 year old male with hypertension, prostate cancer, prior CVA who was admitted to the hospital 02/19/2019 from [facility] with profound hypoxic respiratory failure in the setting of COVID-19, and admitted to the ICU requiring heated high flow nasal cannula. Patient was transferred to the floor on 7/23 as he wished to get out of the ICU and met criteria for the floor requiring 15 L high flow nasal cannula.  On the floor patient continued to have episodes of anxiety.  He had a lot of trouble maintaining his oxygen saturations.  This continued for a few days and then finally on 7/28 he got worse.  Multiple discussions held with patient as well as family members.  He was finally intubated and transferred to the ICU.  Reason for Visit: Acute respiratory disease due to COVID-19  Consultants: Pulmonology  Procedures:  Intubation 7/28 Chest tube placement 7/28  Antibiotics: Anti-infectives (From admission, onward)   Start     Dose/Rate Route Frequency Ordered Stop   03/02/19 1400  vancomycin (VANCOCIN) 1,250 mg in sodium chloride 0.9 % 250 mL IVPB  Status:  Discontinued     1,250 mg 166.7 mL/hr over 90 Minutes Intravenous Every 24 hours 03/01/19 1556 03/02/19 1552   03/01/19 1200  ceFEPIme (MAXIPIME) 2 g in sodium chloride 0.9 % 100 mL IVPB     2 g 200 mL/hr over 30 Minutes Intravenous Every 8 hours 03/01/19 1142     03/01/19 1200  vancomycin (VANCOCIN) 2,000 mg in sodium chloride 0.9 % 500 mL IVPB     2,000 mg 250 mL/hr over 120 Minutes Intravenous  Once 03/01/19 1142 03/01/19 1425   02/17/19 1000  vancomycin (VANCOCIN) 1,500 mg in sodium chloride 0.9 % 500 mL IVPB  Status:  Discontinued     1,500 mg 250 mL/hr over 120 Minutes Intravenous Every 24 hours 02/16/19 0934 02/17/19 1338   02/16/19 1000  vancomycin (VANCOCIN) 2,000 mg in sodium chloride 0.9 % 500  mL IVPB     2,000 mg 250 mL/hr over 120 Minutes Intravenous  Once 02/16/19 0934 02/16/19 1250   02/14/19 0700  remdesivir 100 mg in sodium chloride 0.9 % 250 mL IVPB     100 mg 500 mL/hr over 30 Minutes Intravenous Every 24 hours 02/14/19 0010 02/17/19 1625       Subjective/Interval History: Still having some low-grade fevers.  Has some constipation.  Enema has been ordered.  Requiring increasing amounts of FiO2.  Assessment/Plan:  Acute Hypoxic Resp. Failure due to Acute Covid 19 Viral Illness  Vent Mode: PRVC FiO2 (%):  [60 %-100 %] 100 % Set Rate:  [32 bmp] 32 bmp Vt Set:  [420 mL] 420 mL PEEP:  [14 cmH20] 14 cmH20 Plateau Pressure:  [18 cmH20-37 cmH20] 37 cmH20     Component Value Date/Time   PHART 7.281 (L) 03/01/2019 1400   PCO2ART 77.0 (HH) 03/01/2019 1400   PO2ART 58.0 (L) 03/01/2019 1400   HCO3 36.0 (H) 03/01/2019 1400   TCO2 38 (H) 03/01/2019 1400   O2SAT 83.0 03/01/2019 1400    COVID-19 Labs  No results for input(s): DDIMER, FERRITIN, LDH, CRP in the last 72 hours.  Lab Results  Component Value Date   SARSCOV2NAA POSITIVE (A) 02/12/2019   SARSCOV2NAA Detected (A) 02/08/2019     Fever: Remains afebrile Oxygen requirements: Mechanical ventilation.  60% FiO2.  Saturating in the high 80s Antibacterials: Vancomycin  and cefepime started 7/31 Remdesivir: Completed course Steroids: On Solu-Medrol 60 mg every 12 hours Diuretics: Not on scheduled diuretics Actemra: He was given 2 doses Vitamin C and Zinc: Continue DVT Prophylaxis:  SCDs  Research Studies: He was enrolled into the fibrinogen research study by Dr. Chase Caller  Patient now intubated.  Remains on mechanical ventilation.  Pulmonology is following and managing.  He has completed treatment for COVID-19.  He is still on steroids however.  Inflammatory markers have been unremarkable for the last several days.    Pneumothorax Has a chest tube.  Management per pulmonology.  HCAP Patient was having  fevers and elevated WBC count.  Started on cefepime.  Sputum culture positive for Enterobacter.  Overall fevers have improved and WBC count has trended down.  Continue current treatments.  Shock Briefly required Levophed infusion.  This has been weaned off.  History of stroke Patient on Plavix which is being continued.  Intolerant to statins.  Hyperglycemia Secondary to steroids.  SSI.  CBGs are reasonably well controlled.  Acute renal failure with hyperkalemia Potassium level is trending down.  Renal function is currently stable with a creatinine at 1.1.  Monitor volume status.  Recheck labs tomorrow.  Monitor urine output.  Currently he has fair urine output.  Bacteremia 1 out of 4 bottles with coag negative staph.  Another bottle with staph hominis.  These are contaminants. Since he is having recurrent fever, blood cultures have been repeated.  They have not shown any growth as of yet  Thrombocytopenia Unclear etiology. Possibly related to acute illness.  Lovenox is on hold.  Discussed with critical care and concern for possible HIT.  HIT panel has been sent and he has been started on argatroban  History of coronary artery disease with prior PCI/pacemaker Stable.  Obesity BMI 33.06.  History of prostate cancer Stable.  Essential hypertension Currently hypotensive, requiring vasopressors.  Severe anxiety Likely related to his acute illness.  Nutrition Continue tube feedings.  Goals of care. Discussed patient's current condition, ongoing treatments and expected prognosis with patient's granddaughter and son over the phone.  I reiterated that I felt patient was very sick and had a poor prognosis.  Although we are hopeful that he will improve, there is a fair chance that he may not survive this illness.  Family expressed understanding.  They wish to continue with current treatments.  They are hopeful that he will improve.  I also discussed resuscitation and CODE STATUS.  They  will discuss it with the patient's wife and let us know soon.   DVT Prophylaxis: scd PUD Prophylaxis: Protonix Code Status: Full code Family Communication: updated grand daughter and son on 8/2 Disposition Plan: Remain in ICU   Medications:  Scheduled: . chlorhexidine gluconate (MEDLINE KIT)  15 mL Mouth Rinse BID  . Chlorhexidine Gluconate Cloth  6 each Topical Daily  . clonazePAM  2 mg Per Tube Q12H  . clopidogrel  75 mg Per Tube Daily  . feeding supplement (PRO-STAT SUGAR FREE 64)  30 mL Per Tube QID  . insulin aspart  0-20 Units Subcutaneous Q4H  . insulin aspart  2 Units Subcutaneous Q4H  . mouth rinse  15 mL Mouth Rinse 10 times per day  . methylPREDNISolone sodium succinate  60 mg Intravenous Q12H  . oxyCODONE  5 mg Oral Q6H  . pantoprazole sodium  40 mg Per Tube Daily  . polyethylene glycol  17 g Per Tube BID  . sodium chloride flush  10-40 mL Intracatheter  Q12H  . sodium chloride flush  3 mL Intravenous Q12H  . sodium chloride flush  3 mL Intravenous Q12H  . [START ON 03/13/2019] pamrevlumab or placebo  35 mg/kg Intravenous Once  . traZODone  100 mg Oral QHS   Continuous: . sodium chloride    . sodium chloride    . bivalirudin (ANGIOMAX) infusion 0.5 mg/mL (Non-ACS indications) 0.15 mg/kg/hr (03/03/19 1700)  . ceFEPime (MAXIPIME) IV 2 g (03/03/19 1241)  . dexmedetomidine (PRECEDEX) IV infusion 1 mcg/kg/hr (03/03/19 1700)  . dextrose    . feeding supplement (VITAL HIGH PROTEIN) 45 mL/hr at 03/03/19 1700  . fentaNYL infusion INTRAVENOUS 150 mcg/hr (03/03/19 1700)  . midazolam 2 mg/hr (03/03/19 1700)  . norepinephrine (LEVOPHED) Adult infusion Stopped (03/03/19 0201)   WUJ:WJXBJY chloride, sodium chloride, acetaminophen **OR** acetaminophen, fentaNYL, guaiFENesin-codeine, guaiFENesin-dextromethorphan, hydrALAZINE, Ipratropium-Albuterol, lip balm, midazolam, [DISCONTINUED] ondansetron **OR** ondansetron (ZOFRAN) IV, phenol, sodium chloride, sodium chloride flush, sodium  chloride flush, sodium chloride flush   Objective:  Vital Signs  Vitals:   03/03/19 1500 03/03/19 1526 03/03/19 1600 03/03/19 1715  BP:    (!) 97/59  Pulse: (!) 47 71 (!) 43 (!) 49  Resp: 19 (!) 35 (!) 21 20  Temp:      TempSrc:      SpO2: 92% 90% 94% 93%  Weight:      Height:        Intake/Output Summary (Last 24 hours) at 03/03/2019 1805 Last data filed at 03/03/2019 1700 Gross per 24 hour  Intake 2819.24 ml  Output 1625 ml  Net 1194.24 ml   Filed Weights   03/01/19 0500 03/02/19 0451 03/03/19 0500  Weight: 106.1 kg 106.5 kg 106.4 kg    General exam: Intubated and sedated Respiratory system: Clear to auscultation. Respiratory effort normal. Cardiovascular system:RRR. No murmurs, rubs, gallops. Gastrointestinal system: Abdomen is nondistended, soft and nontender. No organomegaly or masses felt. Normal bowel sounds heard. Central nervous system: Unable to assess due to sedation Extremities: No C/C/E, +pedal pulses Skin: No rashes, lesions or ulcers Psychiatry: Sedated     Lab Results:  Data Reviewed: I have personally reviewed following labs and imaging studies  CBC: Recent Labs  Lab 02/27/19 0542 02/28/19 0544  03/01/19 0411 03/01/19 0450 03/01/19 1400 03/02/19 0449 03/03/19 0430  WBC 33.2* 18.7*  --   --  22.8*  --  11.8* 10.3  HGB 15.3 14.0   < > 15.0 14.0 14.6 12.7* 12.2*  HCT 49.9 44.8   < > 44.0 46.4 43.0 42.2 40.3  MCV 97.7 98.2  --   --  100.2*  --  101.7* 102.8*  PLT 108* 72*  --   --  67*  --  65* 61*   < > = values in this interval not displayed.    Basic Metabolic Panel: Recent Labs  Lab 02/26/19 1955  02/27/19 0542  02/27/19 2315 02/28/19 0544  03/01/19 0411 03/01/19 0450 03/01/19 1400 03/02/19 0449 03/03/19 0430  NA  --    < > 140   < > 140 138   < > 141 143 143 146* 147*  K  --    < > 5.7*   < > 5.6* 5.3*   < > 5.7* 5.4* 5.3* 4.9 4.8  CL  --   --  102   < > 105 101  --   --  101  --  108 108  CO2  --   --  29   < > 30 32  --    --  35*  --  34* 34*  GLUCOSE  --   --  132*   < > 132* 161*  --   --  139*  --  166* 163*  BUN  --   --  33*   < > 41* 43*  --   --  52*  --  48* 51*  CREATININE  --   --  1.51*   < > 1.17 1.17  --   --  1.12  --  0.94 0.98  CALCIUM  --   --  7.4*   < > 7.6* 7.9*  --   --  8.0*  --  7.3* 7.6*  MG 2.5*  --  2.3  --   --   --   --   --   --   --   --   --   PHOS 3.6  --  6.5*  --   --   --   --   --   --   --   --   --    < > = values in this interval not displayed.    GFR: Estimated Creatinine Clearance: 74.7 mL/min (by C-G formula based on SCr of 0.98 mg/dL).  Liver Function Tests: Recent Labs  Lab 2019/03/04 0544 03/01/19 0450 03/02/19 0449 03/03/19 0430  AST 27 49* 30 28  ALT 49* 46* 35 33  ALKPHOS 64 53 48 48  BILITOT 1.2 1.6* 1.0 1.0  PROT 4.9* 5.1* 4.4* 4.3*  ALBUMIN 2.8* 2.9* 2.4* 2.2*     CBG: Recent Labs  Lab 03/02/19 2359 03/03/19 0354 03/03/19 0811 03/03/19 1145 03/03/19 1606  GLUCAP 148* 145* 138* 134* 148*     Anemia Panel: No results for input(s): VITAMINB12, FOLATE, FERRITIN, TIBC, IRON, RETICCTPCT in the last 72 hours.   Radiology Studies: Dg Chest Port 1 View  Result Date: 03/03/2019 CLINICAL DATA:  78 year old male COVID-2. Right side pneumothorax, chest tube. EXAM: PORTABLE CHEST 1 VIEW COMPARISON:  03/01/2019 and earlier. FINDINGS: AP view at 0450 hours. Endotracheal tube tip at the level the clavicles. Left upper extremity PICC line is stable. Enteric feeding tube courses to the abdomen, tip not included. Pigtail type chest tube projects over the right lower lung. Trace residual pneumothorax with subtle pleural edge in the apex, less pronounced than on 2019/03/04. Stable cardiac size and mediastinal contours. Stable right chest pacemaker. Confluent but patchy and indistinct left greater than right lower lung opacity is stable. No areas of worsening ventilation. IMPRESSION: 1. Stable lines and tubes. 2. Stable right chest tube. Trace residual right  apical pneumothorax, less apparent than that on Mar 04, 2019. 3. Stable bilateral lower lung predominant pneumonia. Electronically Signed   By: Genevie Ann M.D.   On: 03/03/2019 08:53    Critical care time: 30mns   LOS: 18 days   [employer] [email or web address]  03/03/2019, 6:05 PM

## 2019-03-03 NOTE — Progress Notes (Signed)
ANTICOAGULATION CONSULT NOTE - Initial Consult  Pharmacy Consult for Bivalirudin Indication: r/o HIT  Allergies  Allergen Reactions  . Lisinopril Cough  . Losartan Rash  . Statins Other (See Comments)    Body aches    Patient Measurements: Height: _0  (175.3 cm) Weight: 234 lb 9.1 oz (106.4 kg) IBW/kg (Calculated) : 70.7  Vital Signs: Temp: 100 F (37.8 C) (08/02 0700) Temp Source: Oral (08/02 0700) BP: 101/52 (08/02 1400) Pulse Rate: 31 (08/02 1400)  Labs: Recent Labs    03/01/19 0450 03/01/19 1400 03/02/19 0449 03/03/19 0430  HGB 14.0 14.6 12.7* 12.2*  HCT 46.4 43.0 42.2 40.3  PLT 67*  --  65* 61*  CREATININE 1.12  --  0.94 0.98    Estimated Creatinine Clearance: 74.7 mL/min (by C-G formula based on SCr of 0.98 mg/dL).   Medical History: Past Medical History:  Diagnosis Date  . Abdominal hernia without obstruction or gangrene 05/22/2014  . 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 March 2006 reports two drug-eluting stents placed in the right coronary artery (reported this procedure not currently available).  2. December 2006 cardiac catheterization:  EF 78%, 50% stenosis in the ramus intermedius and the right posterior diagonal artery.  Patient treated medically.  3. December 2006 cardiac MRI negative for ischemia.  EF normal.  4. April 2008 cardiac catheteri  . Calculus of kidney 09/26/2011   Overview:  1. Has bilateral retained kidney stones.  2. Status post urethral stenting.  Overview:  Overview:  1. Has bilateral retained kidney stones.  2. Status post urethral stenting.   . Chronotropic incompetence 07/22/2011   Overview:  Status post permanent pacemaker placement 06/12/2009 by Dr. Norm Salt.  Overview:  Overview:  Status post permanent pacemaker placement 06/12/2009 by Dr. Norm Salt.   . Diastolic dysfunction 16/38/4536   Overview:  1. June 2009 had progressive orthopnea and dyspnea  on exertion and lower extremity edema.  2. July 2009 2D echocardiogram: EF >55%, mild concentric LVH, diastolic dysfunction Grade II, LAE 4.6 cm, mild tricuspid regurgitation.  Overview:  Overview:  1. June 2009 had progressive orthopnea and dyspnea on exertion and lower extremity edema.  2. July 2009 2D echocardiogram: EF >55%, mild   . Essential (primary) hypertension 07/22/2011  . 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   . Hyperlipidemia 07/22/2011   Overview:  1. December 2006 TC 142, TG 92, HDL 32, LDL 93.  2. November 2008 TC 181, TG 97, HDL 29, LDL 33.  3. January 2010 LDL 137, HDL 29.  4. Referral to Dr. Harrell Gave clinic. Patient started on Niacin, which he did not tolerate. Additionally started on Crestor alternating 2.5 mg every other day, which he also was unable to tolerate. Overview:  Overview:  1. December 2006 TC 142, TG 92, HDL 3  . Hypertension   . Malignant neoplasm of prostate ([place]) 03/04/2013  . MI, old   . Migraine without status migrainosus, not intractable 05/22/2014  . Osteoarthritis 09/26/2011   Overview:  Right total knee replacement.  Overview:  Overview:  Right total knee replacement.   . Presence of cardiac pacemaker 07/21/2011   Overview:  Dual-chamber pacemaker is a Biotronik via DR reference number [id number] serial number [id number].  Right atrial lead is a Comptroller number  [id number] serial number [id number].  Right ventricular lead is Biotronik Setrox [id number] reference number is [id number] serial number [id number]. Device implanted 06/11/2009 Overview:  Overview:  Dual-chamber pacemaker is a Biotronik via DR reference n  . Varicella without complication 00/71/2197    Medications:  Scheduled:  . chlorhexidine gluconate (MEDLINE KIT)  15 mL Mouth Rinse BID  .  Chlorhexidine Gluconate Cloth  6 each Topical Daily  . clonazePAM  2 mg Per Tube Q12H  . clopidogrel  75 mg Per Tube Daily  . feeding supplement (PRO-STAT SUGAR FREE 64)  30 mL Per Tube QID  . insulin aspart  0-20 Units Subcutaneous Q4H  . insulin aspart  2 Units Subcutaneous Q4H  . mouth rinse  15 mL Mouth Rinse 10 times per day  . methylPREDNISolone sodium succinate  60 mg Intravenous Q12H  . oxyCODONE  5 mg Oral Q6H  . pantoprazole sodium  40 mg Per Tube Daily  . polyethylene glycol  17 g Per Tube BID  . sodium chloride flush  10-40 mL Intracatheter Q12H  . sodium chloride flush  3 mL Intravenous Q12H  . sodium chloride flush  3 mL Intravenous Q12H  . [START ON 03/13/2019] pamrevlumab or placebo  35 mg/kg Intravenous Once  . traZODone  100 mg Oral QHS    Assessment: 78 y.o. M well known to pharmacy from ICU monitoring. Pt now with thrombocytopenia, plt down to 61. Admit and started Hep/Enox on 7/15; Plt fall starts 7/26, day #12 post heparin/lovenox start (although no CBC done 7/24 or 7/25 so hard to tell exact date of drop). Calculate 4T score 4-5 so intermediate probability of HIT. Pharmacy consulted to begin [place] - will utilize bivalirudin as that is the DTI we currently have at [facility].  Goal of Therapy:  aPTT 50-85 seconds Monitor platelets by anticoagulation protocol: Yes   Plan:  Will begin Bivalirudin 0.30m/kg/hr (order specific wt 106 kg) F/u PTT in 2 hours then q4h until therapeutic x 2 then daily Daily CBC F/u HIT panel results  CSherlon Handing PharmD, BCPS CGV Clinical pharmacist phone [phone]/09/2018,3:27 PM

## 2019-03-03 NOTE — Progress Notes (Signed)
Granddaughter called back requesting that patient's sedation not be changed or weaned for the next 24 hours to allow patient to rest and for his oxygen needs to decrease. She also requested that if sedation is changed or decreased to be notified. Will pass along to dayshift so this can be discussed with primary team.

## 2019-03-03 NOTE — Progress Notes (Signed)
RASS -3/-4, neuro unchanged. Pt unable to wean sedation any 2/2 tachypnea, nasal flaring, desat. TMAX 100.3. Sinus arrythmia vs paced. SBP 90-115, MAP >60. Vent settings unchanged, Sp02 >86%. TF @ goal. Multiple bm since enema. UOP >50/hr. Angiomax started. Wife updated

## 2019-03-03 NOTE — Progress Notes (Signed)
NAME:  Clayton Sawyer, MRN:  [id number], DOB:  1941-07-09, LOS: 63 ADMISSION DATE:  02/11/2019, CONSULTATION DATE: February 13, 2019 REFERRING MD: Dr. 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 Hyperlipidemia History of prostate cancer Obstructive sleep apnea  Significant Hospital Events   7/15 Admit, transfer to [facility] 7/16 enrolled in fibrinogen research drug  7/27 PCCM consulted again for worsening oxygenation, pneumothorax on right 7/28 transferred to the intensive care unit, full code, intubated, right-sided chest tube 7/31 worsening vent settings, severe hypoxemia, high sedation needs  Consults:  Pulmonary and critical care medicine  Procedures:  ETT 7/28 CVC 7/28 Pigtail R 7/28  Significant Diagnostic Tests:    Micro Data:  7/10, 7/15 SARS-CoV-2 positive 7/16 blood > coag neg staph in 1/4  Antimicrobials/COVID treatment  Ceftriaxone July 14 Azithromycin July 15 Remdesivir July 15 > July 19 Actemra July 15, July 19 Vancomycin July 18, July 19 Solumedrol July 14 Pamrevlumab vs placebo, next scheduled infusion July 29  Interim history/subjective:   Worsening ventilator dyssynchrony overnight when attempted to stop Precedex Still on high FiO2 and PEEP needs Otherwise no acute events  Objective   Blood pressure 123/71, pulse 85, temperature 100 F (37.8 C), temperature source Oral, resp. rate (!) 35, height 5\' 9"  (1.753 m), weight 106.4 kg, SpO2 (!) 88 %.    Vent Mode: PRVC FiO2 (%):  [60 %-100 %] 60 % Set Rate:  [32 bmp] 32 bmp Vt Set:  [420 mL] 420 mL PEEP:  [14 cmH20] 14 cmH20 Plateau Pressure:  [18 cmH20] 18 cmH20   Intake/Output Summary (Last 24 hours) at 03/03/2019 0801 Last data filed at 03/03/2019 4944 Gross per 24 hour  Intake 1489.21 ml  Output  1490 ml  Net -0.79 ml   Filed Weights   03/01/19 0500 03/02/19 0451 03/03/19 0500  Weight: 106.1 kg 106.5 kg 106.4 kg    Examination:  General:  In bed on vent HENT: NCAT ETT in place PULM: CTA B, vent supported breathing CV: RRR, no mgr GI: BS+, soft, nontender MSK: normal bulk and tone Neuro: sedated on vent  8/2 CXR > severe bilateral airspace disease, worse in bases left greater than right, trace apical pneumothorax, pigtail catheter in place, endotracheal tube in place, PICC line in place, dual-lead pacemaker in place  Resolved Hospital Problem list     Assessment & Plan:  ARDS due to COVID-19 pneumonia, advanced age Right sided pneumothorax status post 28 French pigtail chest tube catheter Continue chest tube to suction, -20 cm H20 Flush/dressing management per orderset Continue mechanical ventilation per ARDS protocol Target TVol 6-8cc/kgIBW Target Plateau Pressure < 30cm H20 Target driving pressure less than 15 cm of water Target PaO2 55-65: titrate PEEP/FiO2 per protocol As long as PaO2 to FiO2 ratio is less than 1:150 position in prone position for 16 hours a day Check CVP daily if CVL in place Target CVP less than 4, diurese as necessary Ventilator associated pneumonia prevention protocol August 1 plan: add oral sedatives as he has been dyssynchronous on only two IV sedatives, wean per protocol  Sedation needs while on mechanical ventilation Continue  midazolam and fentanyl for now Start oral meds: continue clonazepam, add oxycodone Wean off precedex after starting oxycodone Change versed infusion to have titratable range up to 5mg /hr  Thromboctytopenia: stable, likely  multi-factorial, but received heparin products this admission and time course and severity of decrease are in line with high pre-test probabiliy for HITT per 4T score Based on high probability of thrombosis with COVID and high pre-test probability of HITT I think it is reasonable to treat with  argatroban  Prognosis: poor, continue to engage family in goals of care conversations  Best practice:  Diet: tube feeding Pain/Anxiety/Delirium protocol (if indicated): yes VAP protocol (if indicated): yes DVT prophylaxis: SCD GI prophylaxis: Pantoprazole for stress ulcer prophylaxis Glucose control: per TRH Mobility: bed rest Code Status: full Family Communication: per [facility] Disposition: remain in ICU  Labs   CBC: Recent Labs  Lab 02/27/19 0542 02/28/19 0544  03/01/19 0411 03/01/19 0450 03/01/19 1400 03/02/19 0449 03/03/19 0430  WBC 33.2* 18.7*  --   --  22.8*  --  11.8* 10.3  HGB 15.3 14.0   < > 15.0 14.0 14.6 12.7* 12.2*  HCT 49.9 44.8   < > 44.0 46.4 43.0 42.2 40.3  MCV 97.7 98.2  --   --  100.2*  --  101.7* 102.8*  PLT 108* 72*  --   --  67*  --  65* 61*   < > = values in this interval not displayed.    Basic Metabolic Panel: Recent Labs  Lab 02/26/19 1955  02/27/19 0542  02/27/19 2315 02/28/19 0544  03/01/19 0411 03/01/19 0450 03/01/19 1400 03/02/19 0449 03/03/19 0430  NA  --    < > 140   < > 140 138   < > 141 143 143 146* 147*  K  --    < > 5.7*   < > 5.6* 5.3*   < > 5.7* 5.4* 5.3* 4.9 4.8  CL  --   --  102   < > 105 101  --   --  101  --  108 108  CO2  --   --  29   < > 30 32  --   --  35*  --  34* 34*  GLUCOSE  --   --  132*   < > 132* 161*  --   --  139*  --  166* 163*  BUN  --   --  33*   < > 41* 43*  --   --  52*  --  48* 51*  CREATININE  --   --  1.51*   < > 1.17 1.17  --   --  1.12  --  0.94 0.98  CALCIUM  --   --  7.4*   < > 7.6* 7.9*  --   --  8.0*  --  7.3* 7.6*  MG 2.5*  --  2.3  --   --   --   --   --   --   --   --   --   PHOS 3.6  --  6.5*  --   --   --   --   --   --   --   --   --    < > = values in this interval not displayed.   GFR: Estimated Creatinine Clearance: 74.7 mL/min (by C-G formula based on SCr of 0.98 mg/dL). Recent Labs  Lab 02/26/19 1955  02/28/19 0544 03/01/19 0450 03/01/19 0838 03/02/19 0449 03/03/19 0430   PROCALCITON 0.33  --   --   --  0.32 0.20 0.16  WBC  --    < > 18.7* 22.8*  --  11.8* 10.3   < > = values in this interval not displayed.    Liver Function Tests: Recent Labs  Lab 02/28/19 0544 03/01/19 0450 03/02/19 0449 03/03/19 0430  AST 27 49* 30 28  ALT 49* 46* 35 33  ALKPHOS 64 53 48 48  BILITOT 1.2 1.6* 1.0 1.0  PROT 4.9* 5.1* 4.4* 4.3*  ALBUMIN 2.8* 2.9* 2.4* 2.2*   No results for input(s): LIPASE, AMYLASE in the last 168 hours. No results for input(s): AMMONIA in the last 168 hours.  ABG    Component Value Date/Time   PHART 7.281 (L) 03/01/2019 1400   PCO2ART 77.0 (HH) 03/01/2019 1400   PO2ART 58.0 (L) 03/01/2019 1400   HCO3 36.0 (H) 03/01/2019 1400   TCO2 38 (H) 03/01/2019 1400   O2SAT 83.0 03/01/2019 1400     Coagulation Profile: No results for input(s): INR, PROTIME in the last 168 hours.  Cardiac Enzymes: No results for input(s): CKTOTAL, CKMB, CKMBINDEX, TROPONINI in the last 168 hours.  HbA1C: Hemoglobin A1C  Date/Time Value Ref Range Status  09/22/2012 02:49 AM 5.9 4.2 - 6.3 % Final    Comment:    The American Diabetes Association recommends that a primary goal of therapy should be <7% and that physicians should reevaluate the treatment regimen in patients with HbA1c values consistently >8%.     CBG: Recent Labs  Lab 03/02/19 1147 03/02/19 1554 03/02/19 1937 03/02/19 2359 03/03/19 0354  GLUCAP 122* 146* 158* 148* 145*     Critical care time: 38 minutes     Roselie Awkward, MD [place] [facility] Pager: [phone] Cell: [phone] If no response, call [phone]

## 2019-03-03 NOTE — Progress Notes (Signed)
Granddaughter Apolonio Schneiders called requesting update. She was given a chance to ask questions. Educated on ventilator settings. She asked to say goodnight to her grandfather. Given a chance to speak with him. Will continue to closely monitor.

## 2019-03-03 NOTE — Progress Notes (Signed)
ANTICOAGULATION CONSULT NOTE - Follow Up Consult  Pharmacy Consult for Bivalirudin Indication: r/o HIT  Allergies  Allergen Reactions  . Heparin     HIT  . Lisinopril Cough  . Losartan Rash  . Statins Other (See Comments)    Body aches    Patient Measurements: Height: 5' 9"  (175.3 cm) Weight: 234 lb 9.1 oz (106.4 kg) IBW/kg (Calculated) : 70.7  Vital Signs: Temp: 98 F (36.7 C) (08/02 1935) Temp Source: Oral (08/02 1935) BP: 105/68 (08/02 2000) Pulse Rate: 37 (08/02 2000)  Labs: Recent Labs    03/01/19 0450 03/01/19 1400 03/02/19 0449 03/03/19 0430 03/03/19 1820  HGB 14.0 14.6 12.7* 12.2*  --   HCT 46.4 43.0 42.2 40.3  --   PLT 67*  --  65* 61*  --   APTT  --   --   --   --  44*  CREATININE 1.12  --  0.94 0.98  --     Estimated Creatinine Clearance: 74.7 mL/min (by C-G formula based on SCr of 0.98 mg/dL).   Medical History: Past Medical History:  Diagnosis Date  . Abdominal hernia without obstruction or gangrene 05/22/2014  . 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 March 2006 reports two drug-eluting stents placed in the right coronary artery (reported this procedure not currently available).  2. December 2006 cardiac catheterization:  EF 78%, 50% stenosis in the ramus intermedius and the right posterior diagonal artery.  Patient treated medically.  3. December 2006 cardiac MRI negative for ischemia.  EF normal.  4. April 2008 cardiac catheteri  . Calculus of kidney 09/26/2011   Overview:  1. Has bilateral retained kidney stones.  2. Status post urethral stenting.  Overview:  Overview:  1. Has bilateral retained kidney stones.  2. Status post urethral stenting.   . Chronotropic incompetence 07/22/2011   Overview:  Status post permanent pacemaker placement 06/12/2009 by Dr. Norm Salt.  Overview:  Overview:  Status post permanent pacemaker placement 06/12/2009 by Dr. Norm Salt.   . Diastolic  dysfunction 84/69/6295   Overview:  1. June 2009 had progressive orthopnea and dyspnea on exertion and lower extremity edema.  2. July 2009 2D echocardiogram: EF >55%, mild concentric LVH, diastolic dysfunction Grade II, LAE 4.6 cm, mild tricuspid regurgitation.  Overview:  Overview:  1. June 2009 had progressive orthopnea and dyspnea on exertion and lower extremity edema.  2. July 2009 2D echocardiogram: EF >55%, mild   . Essential (primary) hypertension 07/22/2011  . 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   . Hyperlipidemia 07/22/2011   Overview:  1. December 2006 TC 142, TG 92, HDL 32, LDL 93.  2. November 2008 TC 181, TG 97, HDL 29, LDL 33.  3. January 2010 LDL 137, HDL 29.  4. Referral to Dr. Harrell Gave clinic. Patient started on Niacin, which he did not tolerate. Additionally started on Crestor alternating 2.5 mg every other day, which he also was unable to tolerate. Overview:  Overview:  1. December 2006 TC 142, TG 92, HDL 3  . Hypertension   . Malignant neoplasm of prostate ([place]) 03/04/2013  . MI, old   . Migraine without status migrainosus, not intractable 05/22/2014  . Osteoarthritis 09/26/2011   Overview:  Right total knee replacement.  Overview:  Overview:  Right total  knee replacement.   . Presence of cardiac pacemaker 07/21/2011   Overview:  Dual-chamber pacemaker is a Biotronik via DR reference number [id number] serial number [id number].  Right atrial lead is a Comptroller number [id number] serial number [id number].  Right ventricular lead is Biotronik Setrox [id number] reference number is [id number] serial number [id number]. Device implanted 06/11/2009 Overview:  Overview:  Dual-chamber pacemaker is a Biotronik via DR reference n  . Varicella without complication 21/82/8833    Medications:   Scheduled:  . chlorhexidine gluconate (MEDLINE KIT)  15 mL Mouth Rinse BID  . Chlorhexidine Gluconate Cloth  6 each Topical Daily  . clonazePAM  2 mg Per Tube Q12H  . clopidogrel  75 mg Per Tube Daily  . feeding supplement (PRO-STAT SUGAR FREE 64)  30 mL Per Tube QID  . insulin aspart  0-20 Units Subcutaneous Q4H  . insulin aspart  2 Units Subcutaneous Q4H  . mouth rinse  15 mL Mouth Rinse 10 times per day  . methylPREDNISolone sodium succinate  60 mg Intravenous Q12H  . oxyCODONE  5 mg Oral Q6H  . pantoprazole sodium  40 mg Per Tube Daily  . polyethylene glycol  17 g Per Tube BID  . sodium chloride flush  10-40 mL Intracatheter Q12H  . sodium chloride flush  3 mL Intravenous Q12H  . sodium chloride flush  3 mL Intravenous Q12H  . [START ON 03/13/2019] pamrevlumab or placebo  35 mg/kg Intravenous Once  . traZODone  100 mg Oral QHS    Assessment: 78 y.o. M well known to pharmacy from ICU monitoring. Pt now with thrombocytopenia, plt down to 61. Admit and started Hep/Enox on 7/15; Plt fall starts 7/26, day #12 post heparin/lovenox start (although no CBC done 7/24 or 7/25 so hard to tell exact date of drop). Calculate 4T score 4-5 so intermediate probability of HIT. Pharmacy consulted to begin [place] - will utilize bivalirudin as that is the DTI we currently have at [facility].  03/03/2019 8:44 PM: First PTT subtherapeutic (44 seconds). No bleeding or complications documented.  Goal of Therapy:  aPTT 50-85 seconds Monitor platelets by anticoagulation protocol: Yes   Plan:  Increase Bivalirudin by 20% (0.67m/kg/hr (order specific wt 106 kg) F/u PTT in 2 hours then q4h until therapeutic x 2 then daily Daily CBC F/u HIT panel results  EPeggyann Juba PharmD, BCPS Pharmacy: [phone] phone [phone]/09/2018,8:42 PM

## 2019-03-03 NOTE — Progress Notes (Signed)
Patient's granddaughter called for update. All questions answered. Requested to tell patient goodnight, placed on speaker phone. Thankful for the update.

## 2019-03-04 ENCOUNTER — Inpatient Hospital Stay (HOSPITAL_COMMUNITY): Payer: Medicare Other

## 2019-03-04 DIAGNOSIS — J939 Pneumothorax, unspecified: Secondary | ICD-10-CM

## 2019-03-04 LAB — COMPREHENSIVE METABOLIC PANEL
ALT: 34 U/L (ref 0–44)
AST: 30 U/L (ref 15–41)
Albumin: 2.1 g/dL — ABNORMAL LOW (ref 3.5–5.0)
Alkaline Phosphatase: 45 U/L (ref 38–126)
Anion gap: 7 (ref 5–15)
BUN: 54 mg/dL — ABNORMAL HIGH (ref 8–23)
CO2: 34 mmol/L — ABNORMAL HIGH (ref 22–32)
Calcium: 7.4 mg/dL — ABNORMAL LOW (ref 8.9–10.3)
Chloride: 112 mmol/L — ABNORMAL HIGH (ref 98–111)
Creatinine, Ser: 0.94 mg/dL (ref 0.61–1.24)
GFR calc Af Amer: >60 mL/min (ref >60)
GFR calc non Af Amer: >60 mL/min (ref >60)
Glucose, Bld: 159 mg/dL — ABNORMAL HIGH (ref 70–99)
Potassium: 4.4 mmol/L (ref 3.5–5.1)
Sodium: 153 mmol/L — ABNORMAL HIGH (ref 135–145)
Total Bilirubin: 0.8 mg/dL (ref 0.3–1.2)
Total Protein: 4 g/dL — ABNORMAL LOW (ref 6.5–8.1)

## 2019-03-04 LAB — CULTURE, RESPIRATORY W GRAM STAIN

## 2019-03-04 LAB — POCT I-STAT 7, (LYTES, BLD GAS, ICA,H+H)
Acid-Base Excess: 7 mmol/L — ABNORMAL HIGH (ref 0.0–2.0)
Acid-Base Excess: 8 mmol/L — ABNORMAL HIGH (ref 0.0–2.0)
Acid-Base Excess: 6 mmol/L — ABNORMAL HIGH (ref 0.0–2.0)
Bicarbonate: 38.1 mmol/L — ABNORMAL HIGH (ref 20.0–28.0)
Bicarbonate: 37.7 mmol/L — ABNORMAL HIGH (ref 20.0–28.0)
Bicarbonate: 33.7 mmol/L — ABNORMAL HIGH (ref 20.0–28.0)
Calcium, Ion: 1.25 mmol/L (ref 1.15–1.40)
Calcium, Ion: 1.23 mmol/L (ref 1.15–1.40)
Calcium, Ion: 1.2 mmol/L (ref 1.15–1.40)
HCT: 39 % (ref 39.0–52.0)
HCT: 38 % — ABNORMAL LOW (ref 39.0–52.0)
HCT: 37 % — ABNORMAL LOW (ref 39.0–52.0)
Hemoglobin: 13.3 g/dL (ref 13.0–17.0)
Hemoglobin: 12.9 g/dL — ABNORMAL LOW (ref 13.0–17.0)
Hemoglobin: 12.6 g/dL — ABNORMAL LOW (ref 13.0–17.0)
O2 Saturation: 89 %
O2 Saturation: 88 %
O2 Saturation: 70 %
Patient temperature: 98.91
Patient temperature: 99.4
Patient temperature: 99.4
Potassium: 4.5 mmol/L (ref 3.5–5.1)
Potassium: 4.4 mmol/L (ref 3.5–5.1)
Potassium: 4.7 mmol/L (ref 3.5–5.1)
Sodium: 151 mmol/L — ABNORMAL HIGH (ref 135–145)
Sodium: 150 mmol/L — ABNORMAL HIGH (ref 135–145)
Sodium: 151 mmol/L — ABNORMAL HIGH (ref 135–145)
TCO2: 41 mmol/L — ABNORMAL HIGH (ref 22–32)
TCO2: 40 mmol/L — ABNORMAL HIGH (ref 22–32)
TCO2: 36 mmol/L — ABNORMAL HIGH (ref 22–32)
pCO2 arterial: 93.3 mmHg (ref 32.0–48.0)
pCO2 arterial: 85.9 mmHg (ref 32.0–48.0)
pCO2 arterial: 65.1 mmHg (ref 32.0–48.0)
pH, Arterial: 7.22 — ABNORMAL LOW (ref 7.350–7.450)
pH, Arterial: 7.253 — ABNORMAL LOW (ref 7.350–7.450)
pH, Arterial: 7.324 — ABNORMAL LOW (ref 7.350–7.450)
pO2, Arterial: 72 mmHg — ABNORMAL LOW (ref 83.0–108.0)
pO2, Arterial: 68 mmHg — ABNORMAL LOW (ref 83.0–108.0)
pO2, Arterial: 42 mmHg — ABNORMAL LOW (ref 83.0–108.0)

## 2019-03-04 LAB — CBC
HCT: 38.9 % — ABNORMAL LOW (ref 39.0–52.0)
Hemoglobin: 11.6 g/dL — ABNORMAL LOW (ref 13.0–17.0)
MCH: 31.4 pg (ref 26.0–34.0)
MCHC: 29.8 g/dL — ABNORMAL LOW (ref 30.0–36.0)
MCV: 105.1 fL — ABNORMAL HIGH (ref 80.0–100.0)
Platelets: 59 10*3/uL — ABNORMAL LOW (ref 150–400)
RBC: 3.7 MIL/uL — ABNORMAL LOW (ref 4.22–5.81)
RDW: 16.9 % — ABNORMAL HIGH (ref 11.5–15.5)
WBC: 10.3 10*3/uL (ref 4.0–10.5)
nRBC: 0 % (ref 0.0–0.2)

## 2019-03-04 LAB — PROTIME-INR
INR: 1.8 — ABNORMAL HIGH (ref 0.8–1.2)
Prothrombin Time: 20.8 seconds — ABNORMAL HIGH (ref 11.4–15.2)

## 2019-03-04 LAB — GLUCOSE, CAPILLARY
Glucose-Capillary: 133 mg/dL — ABNORMAL HIGH (ref 70–99)
Glucose-Capillary: 149 mg/dL — ABNORMAL HIGH (ref 70–99)
Glucose-Capillary: 151 mg/dL — ABNORMAL HIGH (ref 70–99)
Glucose-Capillary: 158 mg/dL — ABNORMAL HIGH (ref 70–99)
Glucose-Capillary: 167 mg/dL — ABNORMAL HIGH (ref 70–99)
Glucose-Capillary: 170 mg/dL — ABNORMAL HIGH (ref 70–99)
Glucose-Capillary: 171 mg/dL — ABNORMAL HIGH (ref 70–99)

## 2019-03-04 LAB — APTT
aPTT: 57 seconds — ABNORMAL HIGH (ref 24–36)
aPTT: 55 seconds — ABNORMAL HIGH (ref 24–36)

## 2019-03-04 MED ORDER — FREE WATER
300.0000 mL | Freq: Four times a day (QID) | Status: DC
  Administered 2019-03-04 – 2019-03-14 (×42): 300 mL

## 2019-03-04 MED ORDER — OXYCODONE HCL 5 MG PO TABS
10.0000 mg | ORAL_TABLET | Freq: Four times a day (QID) | ORAL | Status: DC
  Administered 2019-03-04 – 2019-03-13 (×36): 10 mg via ORAL
  Filled 2019-03-04 (×36): qty 2

## 2019-03-04 NOTE — Progress Notes (Signed)
Changes made based on ABG result.  ABG will be obtained again within one hour.

## 2019-03-04 NOTE — Progress Notes (Signed)
Physical Therapy Discharge Patient Details Name: Clayton Sawyer MRN: [id number] DOB: 1940/09/30 Today's Date: 03/04/2019 Time:  -     Patient discharged from PT services secondary to medical decline - will need to re-order PT to resume therapy services.  Please see latest therapy progress note for current level of functioning and progress toward goals.    Patient remains on vent and sedated.  GP     Claretha Cooper 03/04/2019, 7:11 AM

## 2019-03-04 NOTE — Progress Notes (Signed)
Changes made based on ABG results.  Another ABg will be obtained within 30-45mins.

## 2019-03-04 NOTE — Progress Notes (Signed)
NAME:  Clayton Sawyer, MRN:  [id number], DOB:  06-10-1941, LOS: 7 ADMISSION DATE:  02/27/2019, CONSULTATION DATE: February 13, 2019 REFERRING MD: Dr. 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 Hyperlipidemia History of prostate cancer Obstructive sleep apnea  Significant Hospital Events   7/15 Admit, transfer to [facility] 7/16 enrolled in fibrinogen research drug  7/27 PCCM consulted again for worsening oxygenation, pneumothorax on right 7/28 transferred to the intensive care unit, full code, intubated, right-sided chest tube 7/31 worsening vent settings, severe hypoxemia, high sedation needs 8/2 Worsening ventilator dyssynchrony overnight when attempted to stop Precedex, started bivalrudin  Consults:  Pulmonary and critical care medicine  Procedures:  ETT 7/28 CVC 7/28 Pigtail R 7/28  Significant Diagnostic Tests:    Micro Data:  7/10, 7/15 SARS-CoV-2 positive 7/16 blood > coag neg staph in 1/4 7/31 blood >  7/31 resp > enterobacter, pseudomonas  Antimicrobials/COVID treatment  Ceftriaxone July 14 Azithromycin July 15 Remdesivir July 15 > July 19 Actemra July 15, July 19 Vancomycin July 18, July 19 Solumedrol July 14 Pamrevlumab vs placebo, next scheduled infusion July 29 Cefepime 7/31>   Interim history/subjective:   Again worsening hypoxemia and ventilator needs Had to add back precedex High dose IV sedatives Lots of agitation Culture positive  Objective   Blood pressure (!) 100/51, pulse 82, temperature 98.9 F (37.2 C), resp. rate (!) 35, height 5\' 9"  (1.753 m), weight 107.1 kg, SpO2 97 %.    Vent Mode: PCV FiO2 (%):  [90 %-100 %] 100 % Set Rate:  [32 bmp-35 bmp] 35 bmp Vt Set:  [420 mL] 420 mL PEEP:  [14 cmH20-16 cmH20] 16 cmH20 Plateau Pressure:  [26  cmH20-41 cmH20] 41 cmH20   Intake/Output Summary (Last 24 hours) at 03/04/2019 1225 Last data filed at 03/04/2019 0600 Gross per 24 hour  Intake 2152.59 ml  Output 1570 ml  Net 582.59 ml   Filed Weights   03/02/19 0451 03/03/19 0500 03/04/19 0500  Weight: 106.5 kg 106.4 kg 107.1 kg    Examination:  General:  In bed on vent HENT: NCAT ETT in place PULM: Rhonchi bilaterally, vent supported breathing CV: RRR, no mgr GI: BS+, soft, nontender MSK: normal bulk and tone Neuro: sedated on vent   8/3 CXR images independently reviewed: unchanged bilateral airspace disease, pigtail catheter in place in right chest, pacemaker in place, ETT in place  Resolved Hospital Problem list     Assessment & Plan:  ARDS due to COVID-19 pneumonia, prognosis dismal, worsening oxygenation Right sided pneumothorax status post 14 French pigtail chest tube catheter HCAP: pseudomonas and enterobacter Continue cefepime Continue chest tube to suction -20 cm H20 until extubated Continue mechanical ventilation per ARDS protocol Target TVol 6-8cc/kgIBW Target Plateau Pressure < 30cm H20 Target driving pressure less than 15 cm of water Target PaO2 55-65: titrate PEEP/FiO2 per protocol As long as PaO2 to FiO2 ratio is less than 1:150 position in prone position for 16 hours a day Check CVP daily if CVL in place Target CVP less than 4, diurese as necessary Ventilator associated pneumonia prevention protocol 8/3 ventilator plan: continue full mechanical ventilator support, no plans to change vent settings today, need to have ongoing goals of care conversation with family  Sedation needs while on mechanical ventilation Continue midazolam and fentanyl and precedex Scheduled oral  sedation: increase oxycodone, continue clonazepam RASS target -3  Thromboctytopenia: high probability HITT F/U HITT Ab Continue argatroban  Prognosis: continue goals of care conversations with family; complex situation because the  patient's prognosis is very poor and the granddaughter has unrealistic expectations of the capacity of modern [employer] medicine.  The family has chosen to let her make the decisions.  Will likely need to engage his wife and son again soon.    Given the fact that he is worsening, has advanced age, and his condition is worsening his prognosis is very poor.  Should he have a cardiac arrest then he would not recover with CPR or electric shocks.  Further those interventions would cause harm (broken ribs, pain) without medical benefit.  Best practice:  Diet: tube feeding Pain/Anxiety/Delirium protocol (if indicated): yes, see above VAP protocol (if indicated): yes DVT prophylaxis: SCD GI prophylaxis: Pantoprazole for stress ulcer prophylaxis Glucose control: per TRH Mobility: bed rest Code Status: full Family Communication: per [facility] Disposition: remain in ICU  Labs   CBC: Recent Labs  Lab 02/28/19 0544  03/01/19 0450  03/02/19 0449 03/03/19 0430 03/04/19 0255 03/04/19 0404 03/04/19 0502 03/04/19 0510  WBC 18.7*  --  22.8*  --  11.8* 10.3  --   --   --  10.3  HGB 14.0   < > 14.0   < > 12.7* 12.2* 13.3 12.9* 12.6* 11.6*  HCT 44.8   < > 46.4   < > 42.2 40.3 39.0 38.0* 37.0* 38.9*  MCV 98.2  --  100.2*  --  101.7* 102.8*  --   --   --  105.1*  PLT 72*  --  67*  --  65* 61*  --   --   --  59*   < > = values in this interval not displayed.    Basic Metabolic Panel: Recent Labs  Lab 02/26/19 1955  02/27/19 0542  02/28/19 0544  03/01/19 0450  03/02/19 0449 03/03/19 0430 03/04/19 0255 03/04/19 0404 03/04/19 0502 03/04/19 0510  NA  --    < > 140   < > 138   < > 143   < > 146* 147* 151* 150* 151* 153*  K  --    < > 5.7*   < > 5.3*   < > 5.4*   < > 4.9 4.8 4.5 4.4 4.7 4.4  CL  --   --  102   < > 101  --  101  --  108 108  --   --   --  112*  CO2  --   --  29   < > 32  --  35*  --  34* 34*  --   --   --  34*  GLUCOSE  --   --  132*   < > 161*  --  139*  --  166* 163*  --   --   --   159*  BUN  --   --  33*   < > 43*  --  52*  --  48* 51*  --   --   --  54*  CREATININE  --   --  1.51*   < > 1.17  --  1.12  --  0.94 0.98  --   --   --  0.94  CALCIUM  --   --  7.4*   < > 7.9*  --  8.0*  --  7.3* 7.6*  --   --   --  7.4*  MG 2.5*  --  2.3  --   --   --   --   --   --   --   --   --   --   --   PHOS 3.6  --  6.5*  --   --   --   --   --   --   --   --   --   --   --    < > = values in this interval not displayed.   GFR: Estimated Creatinine Clearance: 78.1 mL/min (by C-G formula based on SCr of 0.94 mg/dL). Recent Labs  Lab 02/26/19 1955  03/01/19 0450 03/01/19 0838 03/02/19 0449 03/03/19 0430 03/04/19 0510  PROCALCITON 0.33  --   --  0.32 0.20 0.16  --   WBC  --    < > 22.8*  --  11.8* 10.3 10.3   < > = values in this interval not displayed.    Liver Function Tests: Recent Labs  Lab 02/28/19 0544 03/01/19 0450 03/02/19 0449 03/03/19 0430 03/04/19 0510  AST 27 49* 30 28 30   ALT 49* 46* 35 33 34  ALKPHOS 64 53 48 48 45  BILITOT 1.2 1.6* 1.0 1.0 0.8  PROT 4.9* 5.1* 4.4* 4.3* 4.0*  ALBUMIN 2.8* 2.9* 2.4* 2.2* 2.1*   No results for input(s): LIPASE, AMYLASE in the last 168 hours. No results for input(s): AMMONIA in the last 168 hours.  ABG    Component Value Date/Time   PHART 7.324 (L) 03/04/2019 0502   PCO2ART 65.1 (HH) 03/04/2019 0502   PO2ART 42.0 (L) 03/04/2019 0502   HCO3 33.7 (H) 03/04/2019 0502   TCO2 36 (H) 03/04/2019 0502   O2SAT 70.0 03/04/2019 0502     Coagulation Profile: Recent Labs  Lab 03/04/19 0510  INR 1.8*    Cardiac Enzymes: No results for input(s): CKTOTAL, CKMB, CKMBINDEX, TROPONINI in the last 168 hours.  HbA1C: Hemoglobin A1C  Date/Time Value Ref Range Status  09/22/2012 02:49 AM 5.9 4.2 - 6.3 % Final    Comment:    The American Diabetes Association recommends that a primary goal of therapy should be <7% and that physicians should reevaluate the treatment regimen in patients with HbA1c values consistently >8%.      CBG: Recent Labs  Lab 03/03/19 1935 03/04/19 0018 03/04/19 0315 03/04/19 0750 03/04/19 1133  GLUCAP 155* 151* 158* 133* 149*     Critical care time: 37 minutes     Roselie Awkward, MD [place] [facility] Pager: [phone] Cell: [phone] If no response, call [phone]

## 2019-03-04 NOTE — Progress Notes (Signed)
PROGRESS NOTE  Clayton Sawyer:[id number] DOB: February 28, 1941 DOA: 01/30/2019  PCP: Jodi Marble, MD  Brief History/Interval Summary: 78 year old male with hypertension, prostate cancer, prior CVA who was admitted to the hospital 02/11/2019 from [facility] with profound hypoxic respiratory failure in the setting of COVID-19, and admitted to the ICU requiring heated high flow nasal cannula. Patient was transferred to the floor on 7/23 as he wished to get out of the ICU and met criteria for the floor requiring 15 L high flow nasal cannula.  On the floor patient continued to have episodes of anxiety.  He had a lot of trouble maintaining his oxygen saturations.  This continued for a few days and then finally on 7/28 he got worse.  Multiple discussions held with patient as well as family members.  He was finally intubated and transferred to the ICU.  Reason for Visit: Acute respiratory disease due to COVID-19  Consultants: Pulmonology  Procedures:  Intubation 7/28 Chest tube placement 7/28  Antibiotics: Anti-infectives (From admission, onward)   Start     Dose/Rate Route Frequency Ordered Stop   03/02/19 1400  vancomycin (VANCOCIN) 1,250 mg in sodium chloride 0.9 % 250 mL IVPB  Status:  Discontinued     1,250 mg 166.7 mL/hr over 90 Minutes Intravenous Every 24 hours 03/01/19 1556 03/02/19 1552   03/01/19 1200  ceFEPIme (MAXIPIME) 2 g in sodium chloride 0.9 % 100 mL IVPB     2 g 200 mL/hr over 30 Minutes Intravenous Every 8 hours 03/01/19 1142     03/01/19 1200  vancomycin (VANCOCIN) 2,000 mg in sodium chloride 0.9 % 500 mL IVPB     2,000 mg 250 mL/hr over 120 Minutes Intravenous  Once 03/01/19 1142 03/01/19 1425   02/17/19 1000  vancomycin (VANCOCIN) 1,500 mg in sodium chloride 0.9 % 500 mL IVPB  Status:  Discontinued     1,500 mg 250 mL/hr over 120 Minutes Intravenous Every 24 hours 02/16/19 0934 02/17/19 1338   02/16/19 1000  vancomycin (VANCOCIN) 2,000 mg in sodium chloride 0.9 % 500  mL IVPB     2,000 mg 250 mL/hr over 120 Minutes Intravenous  Once 02/16/19 0934 02/16/19 1250   02/14/19 0700  remdesivir 100 mg in sodium chloride 0.9 % 250 mL IVPB     100 mg 500 mL/hr over 30 Minutes Intravenous Every 24 hours 02/14/19 0010 02/17/19 1625       Subjective/Interval History: Still having some low-grade fevers.  Has some constipation.  Enema has been ordered.  Requiring increasing amounts of FiO2.  Assessment/Plan:  Acute Hypoxic Resp. Failure due to Acute Covid 19 Viral Illness  Vent Mode: PCV FiO2 (%):  [80 %-100 %] 80 % Set Rate:  [32 bmp-35 bmp] 35 bmp Vt Set:  [420 mL] 420 mL PEEP:  [14 cmH20-16 cmH20] 16 cmH20 Plateau Pressure:  [26 cmH20-42 cmH20] 42 cmH20     Component Value Date/Time   PHART 7.324 (L) 03/04/2019 0502   PCO2ART 65.1 (HH) 03/04/2019 0502   PO2ART 42.0 (L) 03/04/2019 0502   HCO3 33.7 (H) 03/04/2019 0502   TCO2 36 (H) 03/04/2019 0502   O2SAT 70.0 03/04/2019 0502    COVID-19 Labs  No results for input(s): DDIMER, FERRITIN, LDH, CRP in the last 72 hours.  Lab Results  Component Value Date   SARSCOV2NAA POSITIVE (A) 02/26/2019   SARSCOV2NAA Detected (A) 02/08/2019     Fever: Remains afebrile Oxygen requirements: Mechanical ventilation.  60% FiO2.  Saturating in the high 80s  Antibacterials: Vancomycin and cefepime started 7/31 Remdesivir: Completed course Steroids: On Solu-Medrol 60 mg every 12 hours Diuretics: Not on scheduled diuretics Actemra: He was given 2 doses Vitamin C and Zinc: Continue DVT Prophylaxis:  SCDs  Research Studies: He was enrolled into the fibrinogen research study by Dr. Chase Caller  Patient now intubated.  Remains on mechanical ventilation.  Pulmonology is following and managing.  He has completed treatment for COVID-19.  He is still on steroids however.  Inflammatory markers have been unremarkable for the last several days.  Still requiring high FiO2 and PEEP.  Pneumothorax Has a chest tube.   Management per pulmonology.  HCAP Patient was having fevers and elevated WBC count.  Started on cefepime.  Sputum culture positive for Enterobacter.  Overall fevers have improved and WBC count has trended down.  Continue current treatments.  Shock Patient is currently on norepinephrine infusion. Wean off as tolerated.  History of stroke Patient is chronically on Plavix which is being held due to thrombocytopenia.  Intolerant to statins.  Hyperglycemia Secondary to steroids.  SSI.  CBGs are reasonably well controlled.  Acute renal failure with hyperkalemia Potassium level is trending down.  Renal function is currently stable with a creatinine at 0.9.  Monitor volume status.  Recheck labs tomorrow.  Monitor urine output.  Currently he has fair urine output.  Hypernatremia Start on free water flushes through G tube  Bacteremia 1 out of 4 bottles with coag negative staph.  Another bottle with staph hominis.  These are contaminants. Since he is having recurrent fever, blood cultures have been repeated.  They have not shown any growth as of yet  Thrombocytopenia Unclear etiology. Possibly related to acute illness.  Lovenox is on hold.  Discussed with critical care and concern for possible HIT.  HIT panel has been sent and he has been started on argatroban  History of coronary artery disease with prior PCI/pacemaker Stable.  Obesity BMI 33.06.  History of prostate cancer Stable.  Essential hypertension Currently hypotensive, requiring vasopressors.  Severe anxiety Likely related to his acute illness.  Nutrition Continue tube feedings.  Goals of care. Discussed patient's current condition, ongoing treatment and expected prognosis with patient's wife, granddaughter and son over the phone.  They agree to DNR status.  His family inquired about the possibility of using aviptadil, since it was apparently FDA approved today for widespread use for COVID-19.  I tried to explain to them  that the patients who received this medication in clinical trials were excluded if they were requiring vasopressors, which this patient is.  Aside from exclusion criteria, after discussing with pharmacy, this medication is not available in our system and would likely not become available in a meaningful timeframe. They are having a difficult time coming to terms with this. Discussed with Dr. Lake Bells who agrees with DNR status. He will also call the family and try to address their questions/concerns.   DVT Prophylaxis: scd PUD Prophylaxis: Protonix Code Status: DNR Family Communication: updated family on 8/3 Disposition Plan: Remain in ICU   Medications:  Scheduled:  chlorhexidine gluconate (MEDLINE KIT)  15 mL Mouth Rinse BID   Chlorhexidine Gluconate Cloth  6 each Topical Daily   clonazePAM  2 mg Per Tube Q12H   feeding supplement (PRO-STAT SUGAR FREE 64)  30 mL Per Tube QID   free water  300 mL Per Tube Q6H   insulin aspart  0-20 Units Subcutaneous Q4H   insulin aspart  2 Units Subcutaneous Q4H   mouth  rinse  15 mL Mouth Rinse 10 times per day   methylPREDNISolone sodium succinate  60 mg Intravenous Q12H   oxyCODONE  10 mg Oral Q6H   pantoprazole sodium  40 mg Per Tube Daily   polyethylene glycol  17 g Per Tube BID   sodium chloride flush  10-40 mL Intracatheter Q12H   sodium chloride flush  3 mL Intravenous Q12H   sodium chloride flush  3 mL Intravenous Q12H   [START ON 03/13/2019] pamrevlumab or placebo  35 mg/kg Intravenous Once   traZODone  100 mg Oral QHS   Continuous:  sodium chloride     sodium chloride     bivalirudin (ANGIOMAX) infusion 0.5 mg/mL (Non-ACS indications) 0.18 mg/kg/hr (03/04/19 0723)   ceFEPime (MAXIPIME) IV 2 g (03/04/19 1427)   dexmedetomidine (PRECEDEX) IV infusion 1.1 mcg/kg/hr (03/04/19 1555)   dextrose     feeding supplement (VITAL HIGH PROTEIN) 1,000 mL (03/04/19 1545)   fentaNYL infusion INTRAVENOUS 225 mcg/hr (03/04/19  1631)   midazolam 2.5 mg/hr (03/04/19 1033)   norepinephrine (LEVOPHED) Adult infusion 3 mcg/min (03/04/19 1137)   NID:POEUMP chloride, sodium chloride, acetaminophen **OR** acetaminophen, fentaNYL, guaiFENesin-codeine, guaiFENesin-dextromethorphan, hydrALAZINE, Ipratropium-Albuterol, lip balm, midazolam, [DISCONTINUED] ondansetron **OR** ondansetron (ZOFRAN) IV, phenol, sodium chloride, sodium chloride flush, sodium chloride flush, sodium chloride flush   Objective:  Vital Signs  Vitals:   03/04/19 1545 03/04/19 1600 03/04/19 1615 03/04/19 1630  BP: (!) 119/57 (!) 106/54 (!) 125/53 (!) 122/59  Pulse: (!) 42 (!) 35 (!) 47 (!) 45  Resp: (!) 27 (!) 29 (!) 29 (!) 22  Temp:  99.2 F (37.3 C)    TempSrc:      SpO2: 96% 93% 99% 98%  Weight:      Height:        Intake/Output Summary (Last 24 hours) at 03/04/2019 1728 Last data filed at 03/04/2019 1343 Gross per 24 hour  Intake 1154.21 ml  Output 1660 ml  Net -505.79 ml   Filed Weights   03/02/19 0451 03/03/19 0500 03/04/19 0500  Weight: 106.5 kg 106.4 kg 107.1 kg    General exam: intubated and sedated Respiratory system: Clear to auscultation. Increased respiratory effort Cardiovascular system:RRR. No murmurs, rubs, gallops. Gastrointestinal system: Abdomen is nondistended, soft and nontender. No organomegaly or masses felt. Normal bowel sounds heard. Central nervous system: unable to assess due to sedation Extremities: No C/C/E, +pedal pulses Skin: No rashes, lesions or ulcers Psychiatry: sedated   Lab Results:  Data Reviewed: I have personally reviewed following labs and imaging studies  CBC: Recent Labs  Lab 02/28/19 0544  03/01/19 0450  03/02/19 0449 03/03/19 0430 03/04/19 0255 03/04/19 0404 03/04/19 0502 03/04/19 0510  WBC 18.7*  --  22.8*  --  11.8* 10.3  --   --   --  10.3  HGB 14.0   < > 14.0   < > 12.7* 12.2* 13.3 12.9* 12.6* 11.6*  HCT 44.8   < > 46.4   < > 42.2 40.3 39.0 38.0* 37.0* 38.9*  MCV 98.2  --   100.2*  --  101.7* 102.8*  --   --   --  105.1*  PLT 72*  --  67*  --  65* 61*  --   --   --  59*   < > = values in this interval not displayed.    Basic Metabolic Panel: Recent Labs  Lab 02/26/19 1955  02/27/19 0542  02/28/19 5361  03/01/19 0450  03/02/19 0449 03/03/19 0430 03/04/19 0255  03/04/19 0404 03/04/19 0502 03/04/19 0510  NA  --    < > 140   < > 138   < > 143   < > 146* 147* 151* 150* 151* 153*  K  --    < > 5.7*   < > 5.3*   < > 5.4*   < > 4.9 4.8 4.5 4.4 4.7 4.4  CL  --   --  102   < > 101  --  101  --  108 108  --   --   --  112*  CO2  --   --  29   < > 32  --  35*  --  34* 34*  --   --   --  34*  GLUCOSE  --   --  132*   < > 161*  --  139*  --  166* 163*  --   --   --  159*  BUN  --   --  33*   < > 43*  --  52*  --  48* 51*  --   --   --  54*  CREATININE  --   --  1.51*   < > 1.17  --  1.12  --  0.94 0.98  --   --   --  0.94  CALCIUM  --   --  7.4*   < > 7.9*  --  8.0*  --  7.3* 7.6*  --   --   --  7.4*  MG 2.5*  --  2.3  --   --   --   --   --   --   --   --   --   --   --   PHOS 3.6  --  6.5*  --   --   --   --   --   --   --   --   --   --   --    < > = values in this interval not displayed.    GFR: Estimated Creatinine Clearance: 78.1 mL/min (by C-G formula based on SCr of 0.94 mg/dL).  Liver Function Tests: Recent Labs  Lab 02/28/19 0544 03/01/19 0450 03/02/19 0449 03/03/19 0430 03/04/19 0510  AST 27 49* 30 28 30   ALT 49* 46* 35 33 34  ALKPHOS 64 53 48 48 45  BILITOT 1.2 1.6* 1.0 1.0 0.8  PROT 4.9* 5.1* 4.4* 4.3* 4.0*  ALBUMIN 2.8* 2.9* 2.4* 2.2* 2.1*     CBG: Recent Labs  Lab 03/04/19 0018 03/04/19 0315 03/04/19 0750 03/04/19 1133 03/04/19 1531  GLUCAP 151* 158* 133* 149* 170*     Anemia Panel: No results for input(s): VITAMINB12, FOLATE, FERRITIN, TIBC, IRON, RETICCTPCT in the last 72 hours.   Radiology Studies: Dg Chest Port 1 View  Result Date: 03/04/2019 CLINICAL DATA:  Intubation EXAM: PORTABLE CHEST 1 VIEW COMPARISON:   03/03/2019 FINDINGS: Endotracheal tube is 3 cm above the carina. Right pacer remains in place, unchanged. Feeding tube remains in place. Cardiomegaly. Vascular congestion and bilateral lower lobe opacities are similar to prior study. Right chest tube remains in place. No pneumothorax. IMPRESSION: Endotracheal tube 3 cm above the carina. Bilateral lower lobe airspace opacities are similar prior study. Right chest tube remains in place.  No pneumothorax. Electronically Signed   By: Rolm Baptise M.D.   On: 03/04/2019 03:40   Dg Chest Port 1 View  Result Date: 03/03/2019 CLINICAL DATA:  78 year old male COVID-19. Right side pneumothorax, chest tube. EXAM: PORTABLE CHEST 1 VIEW COMPARISON:  03/01/2019 and earlier. FINDINGS: AP view at 0450 hours. Endotracheal tube tip at the level the clavicles. Left upper extremity PICC line is stable. Enteric feeding tube courses to the abdomen, tip not included. Pigtail type chest tube projects over the right lower lung. Trace residual pneumothorax with subtle pleural edge in the apex, less pronounced than on Mar 27, 2019. Stable cardiac size and mediastinal contours. Stable right chest pacemaker. Confluent but patchy and indistinct left greater than right lower lung opacity is stable. No areas of worsening ventilation. IMPRESSION: 1. Stable lines and tubes. 2. Stable right chest tube. Trace residual right apical pneumothorax, less apparent than that on 03/27/2019. 3. Stable bilateral lower lung predominant pneumonia. Electronically Signed   By: Genevie Ann M.D.   On: 03/03/2019 08:53    Critical care time: 11mns   LOS: 19 days   [employer] [email or web address]  03/04/2019, 5:28 PM

## 2019-03-04 NOTE — Progress Notes (Signed)
ANTICOAGULATION CONSULT NOTE - Follow Up Consult  Pharmacy Consult for Bivalirudin Indication: r/o HIT  Allergies  Allergen Reactions  . Heparin     HIT  . Lisinopril Cough  . Losartan Rash  . Statins Other (See Comments)    Body aches   Patient Measurements: Height: 5\' 9"  (175.3 cm) Weight: 234 lb 9.1 oz (106.4 kg) IBW/kg (Calculated) : 70.7  Vital Signs: Temp: 99.4 F (37.4 C) (08/02 2325) Temp Source: Oral (08/02 2325) BP: 144/70 (08/02 2100) Pulse Rate: 80 (08/02 2325)  Labs: Recent Labs    03/01/19 0450 03/01/19 1400 03/02/19 0449 03/03/19 0430 03/03/19 1820 03/03/19 2320  HGB 14.0 14.6 12.7* 12.2*  --   --   HCT 46.4 43.0 42.2 40.3  --   --   PLT 67*  --  65* 61*  --   --   APTT  --   --   --   --  44* 57*  CREATININE 1.12  --  0.94 0.98  --   --     Estimated Creatinine Clearance: 74.7 mL/min (by C-G formula based on SCr of 0.98 mg/dL).  Assessment: 78 y.o. M well known to pharmacy from ICU monitoring. Pt now with thrombocytopenia, plt down to 61. Admit and started Hep/Enox on 7/15; Plt fall starts 7/26, day #12 post heparin/lovenox start (although no CBC done 7/24 or 7/25 so hard to tell exact date of drop). Calculate 4T score 4-5 so intermediate probability of HIT. Pharmacy consulted to begin [place].  Now aPTT therapeutic at 57 after rate increase.   Goal of Therapy:  aPTT 50-85 seconds Monitor platelets by anticoagulation protocol: Yes   Plan:  Continue bivalirudin at 0.18mg /kg/hr (38.54ml/hr)  Check confirmatory aPTT with daily labs Monitor daily aPTT, CBC, s/s of bleed F/u HIT panel results   Elenor Quinones, PharmD, BCPS, BCIDP Clinical Pharmacist 03/04/2019 12:10 AM

## 2019-03-04 NOTE — Progress Notes (Addendum)
ANTICOAGULATION CONSULT NOTE - Follow Up Consult  Pharmacy Consult for Bivalirudin Indication: r/o HIT  Allergies  Allergen Reactions  . Heparin     HIT  . Lisinopril Cough  . Losartan Rash  . Statins Other (See Comments)    Body aches   Patient Measurements: Height: 5\' 9"  (175.3 cm) Weight: 236 lb 1.8 oz (107.1 kg) IBW/kg (Calculated) : 70.7  Vital Signs: Temp: 98.9 F (37.2 C) (08/03 0837) Temp Source: Oral (08/03 0400) BP: 100/51 (08/03 0600) Pulse Rate: 82 (08/03 0600)  Labs: Recent Labs    03/02/19 0449 03/03/19 0430 03/03/19 1820 03/03/19 2320  03/04/19 0404 03/04/19 0502 03/04/19 0510  HGB 12.7* 12.2*  --   --    < > 12.9* 12.6* 11.6*  HCT 42.2 40.3  --   --    < > 38.0* 37.0* 38.9*  PLT 65* 61*  --   --   --   --   --  59*  APTT  --   --  44* 57*  --   --   --  55*  LABPROT  --   --   --   --   --   --   --  20.8*  INR  --   --   --   --   --   --   --  1.8*  CREATININE 0.94 0.98  --   --   --   --   --  0.94   < > = values in this interval not displayed.    Estimated Creatinine Clearance: 78.1 mL/min (by C-G formula based on SCr of 0.94 mg/dL).  Assessment: 78 y.o. M well known to pharmacy from ICU monitoring. Pt now with thrombocytopenia, plt down to 61. Admit and started Hep/Enox on 7/15; Plt fall starts 7/26, day #12 post heparin/lovenox start (although no CBC done 7/24 or 7/25 so hard to tell exact date of drop). Calculate 4T score 4-5 so intermediate probability of HIT. Pharmacy consulted to begin [place].  APTT remains therapeutic at 55 on 0.18 mg/kg/hr. H/H and Plt continue to trend down. HIT panel pending   Goal of Therapy:  aPTT 50-85 seconds Monitor platelets by anticoagulation protocol: Yes   Plan:  Continue bivalirudin at 0.18mg /kg/hr (38.92ml/hr)  Monitor daily aPTT, CBC, s/s of bleed F/u HIT panel results   Albertina Parr, PharmD., BCPS Clinical Pharmacist Clinical phone for 03/04/19 until 5pm: [phone]   Addendum:  Of note,  patient is on D4 of Cefepime for pneumonia. WBC is wnl. Afebrile. SCr remains stable. Continue Cefepime 2 gm IV Q 8 hours   Albertina Parr, PharmD., BCPS Clinical Pharmacist

## 2019-03-04 NOTE — Progress Notes (Signed)
Patient's granddaughter Apolonio Schneiders called and was updated. All questions answered and she told patient good night over speaker phone.

## 2019-03-04 NOTE — Progress Notes (Addendum)
[facility]  I was asked to speak to the patient's family today including his granddaughter and his son Coralyn Mark.  I spoke to them both on a conference call this evening.  Clayton Sawyer has requested that we administer the drug Avaptidil which was granted FDA fast track designation.  I explained that per clinicaltrials.gov there are several reasons why we cannot administer this agent: it is still an investigational agent and as he has been part of the Fibrogen study protocol in the last 30 days, and he is on vasopressors (contraindication).  His granddaughter then asked that we claim that he has erectile dysfunction and that we administer the agent for his erectile dysfunction.  I explained that we will not administer this medication.    I then spoke to Coralyn Mark (son) as he and I had a lengthy conversation last Tuesday when he was intubated.  I explained that his father's mechanical ventilation settings have worsened over the last 48 hours and his oxygen level has dropped.  I explained that there is nothing further that I can offer other than the current supportive care which has been ineffective in preventing this illness from getting worse.  I explained that we are at a point where the totality of his medical care is causing more harm than good.  Coralyn Mark voiced understanding.    Mr. Cimo code status is DNR.  We will continue to engage Coralyn Mark (son) and Enid Derry (wife) in conversation.  I believe that Rachel's understanding of the limitations of modern western medicine are so far off base, I believe we should limit further engagement with her in the absence of a form stating that she has been granted health care power of attorney.  20 minutes spent in preparation for and actual consultation with the family.  Roselie Awkward, MD [place] [facility] Pager: [phone] Cell: [phone] If no response, call [phone]

## 2019-03-04 NOTE — Progress Notes (Signed)
Changes made based on ABG result.  RT will continue to monitor. 

## 2019-03-05 LAB — COMPREHENSIVE METABOLIC PANEL
ALT: 38 U/L (ref 0–44)
AST: 28 U/L (ref 15–41)
Albumin: 2.3 g/dL — ABNORMAL LOW (ref 3.5–5.0)
Alkaline Phosphatase: 47 U/L (ref 38–126)
Anion gap: 5 (ref 5–15)
BUN: 50 mg/dL — ABNORMAL HIGH (ref 8–23)
CO2: 35 mmol/L — ABNORMAL HIGH (ref 22–32)
Calcium: 8.1 mg/dL — ABNORMAL LOW (ref 8.9–10.3)
Chloride: 112 mmol/L — ABNORMAL HIGH (ref 98–111)
Creatinine, Ser: 0.78 mg/dL (ref 0.61–1.24)
GFR calc Af Amer: >60 mL/min (ref >60)
GFR calc non Af Amer: >60 mL/min (ref >60)
Glucose, Bld: 195 mg/dL — ABNORMAL HIGH (ref 70–99)
Potassium: 4.7 mmol/L (ref 3.5–5.1)
Sodium: 152 mmol/L — ABNORMAL HIGH (ref 135–145)
Total Bilirubin: 0.9 mg/dL (ref 0.3–1.2)
Total Protein: 4.4 g/dL — ABNORMAL LOW (ref 6.5–8.1)

## 2019-03-05 LAB — CBC
HCT: 41.8 % (ref 39.0–52.0)
Hemoglobin: 12.6 g/dL — ABNORMAL LOW (ref 13.0–17.0)
MCH: 31.3 pg (ref 26.0–34.0)
MCHC: 30.1 g/dL (ref 30.0–36.0)
MCV: 103.7 fL — ABNORMAL HIGH (ref 80.0–100.0)
Platelets: 69 10*3/uL — ABNORMAL LOW (ref 150–400)
RBC: 4.03 MIL/uL — ABNORMAL LOW (ref 4.22–5.81)
RDW: 17.2 % — ABNORMAL HIGH (ref 11.5–15.5)
WBC: 9.2 10*3/uL (ref 4.0–10.5)
nRBC: 0 % (ref 0.0–0.2)

## 2019-03-05 LAB — PROTIME-INR
INR: 1.7 — ABNORMAL HIGH (ref 0.8–1.2)
Prothrombin Time: 19.7 seconds — ABNORMAL HIGH (ref 11.4–15.2)

## 2019-03-05 LAB — GLUCOSE, CAPILLARY
Glucose-Capillary: 154 mg/dL — ABNORMAL HIGH (ref 70–99)
Glucose-Capillary: 144 mg/dL — ABNORMAL HIGH (ref 70–99)
Glucose-Capillary: 135 mg/dL — ABNORMAL HIGH (ref 70–99)
Glucose-Capillary: 152 mg/dL — ABNORMAL HIGH (ref 70–99)
Glucose-Capillary: 150 mg/dL — ABNORMAL HIGH (ref 70–99)

## 2019-03-05 LAB — APTT: aPTT: 55 seconds — ABNORMAL HIGH (ref 24–36)

## 2019-03-05 LAB — HEPARIN INDUCED PLATELET AB (HIT ANTIBODY): Heparin Induced Plt Ab: 0.058 OD (ref 0.000–0.400)

## 2019-03-05 MED ORDER — VITAL 1.5 CAL PO LIQD
1000.0000 mL | ORAL | Status: DC
  Administered 2019-03-05 – 2019-03-07 (×3): 1000 mL
  Filled 2019-03-05 (×6): qty 1000

## 2019-03-05 MED ORDER — HYDROMORPHONE BOLUS VIA INFUSION
1.0000 mg | INTRAVENOUS | Status: DC | PRN
  Administered 2019-03-05 – 2019-03-11 (×9): 1 mg via INTRAVENOUS
  Filled 2019-03-05: qty 1

## 2019-03-05 MED ORDER — SODIUM CHLORIDE 0.9 % IV SOLN
1.0000 mg/h | INTRAVENOUS | Status: DC
  Administered 2019-03-05: 3 mg/h via INTRAVENOUS
  Administered 2019-03-06: 04:00:00 2 mg/h via INTRAVENOUS
  Administered 2019-03-06: 1 mg/h via INTRAVENOUS
  Administered 2019-03-08 – 2019-03-09 (×2): 2 mg/h via INTRAVENOUS
  Filled 2019-03-05 (×5): qty 5

## 2019-03-05 MED ORDER — FUROSEMIDE 10 MG/ML IJ SOLN
40.0000 mg | Freq: Once | INTRAMUSCULAR | Status: AC
  Administered 2019-03-05: 13:00:00 40 mg via INTRAVENOUS
  Filled 2019-03-05: qty 4

## 2019-03-05 MED ORDER — ADULT MULTIVITAMIN W/MINERALS CH
1.0000 | ORAL_TABLET | Freq: Every day | ORAL | Status: DC
  Administered 2019-03-05 – 2019-03-16 (×12): 1
  Filled 2019-03-05 (×12): qty 1

## 2019-03-05 MED ORDER — FENTANYL 2500MCG IN NS 250ML (10MCG/ML) PREMIX INFUSION
0.0000 ug/h | INTRAVENOUS | Status: DC
  Administered 2019-03-05: 275 ug/h via INTRAVENOUS
  Filled 2019-03-05: qty 250

## 2019-03-05 MED ORDER — PRO-STAT SUGAR FREE PO LIQD
60.0000 mL | Freq: Three times a day (TID) | ORAL | Status: DC
  Administered 2019-03-05 – 2019-03-16 (×33): 60 mL
  Filled 2019-03-05 (×29): qty 60

## 2019-03-05 NOTE — Progress Notes (Signed)
ANTICOAGULATION CONSULT NOTE - Follow Up Consult  Pharmacy Consult for Bivalirudin Indication: r/o HIT  Allergies  Allergen Reactions  . Heparin     HIT  . Lisinopril Cough  . Losartan Rash  . Statins Other (See Comments)    Body aches   Patient Measurements: Height: 5\' 9"  (175.3 cm) Weight: 243 lb 2.7 oz (110.3 kg) IBW/kg (Calculated) : 70.7  Vital Signs: Temp: 97.7 F (36.5 C) (08/04 0400) Temp Source: Axillary (08/04 0400) BP: 98/44 (08/04 0600) Pulse Rate: 78 (08/04 0600)  Labs: Recent Labs    03/03/19 0430  03/03/19 2320  03/04/19 0502 03/04/19 0510 03/05/19 0500  HGB 12.2*  --   --    < > 12.6* 11.6* 12.6*  HCT 40.3  --   --    < > 37.0* 38.9* 41.8  PLT 61*  --   --   --   --  59* 69*  APTT  --    < > 57*  --   --  55* 55*  LABPROT  --   --   --   --   --  20.8* 19.7*  INR  --   --   --   --   --  1.8* 1.7*  CREATININE 0.98  --   --   --   --  0.94  --    < > = values in this interval not displayed.    Estimated Creatinine Clearance: 79.2 mL/min (by C-G formula based on SCr of 0.94 mg/dL).  Assessment: 78 y.o. M well known to pharmacy from ICU monitoring. Pt now with thrombocytopenia, plt down to 61. Admit and started Hep/Enox on 7/15; Plt fall starts 7/26, day #12 post heparin/lovenox start (although no CBC done 7/24 or 7/25 so hard to tell exact date of drop). Calculate 4T score 4-5 so intermediate probability of HIT. Pharmacy consulted to begin [place].  APTT remains therapeutic at 55 on 0.18 mg/kg/hr. H/H stable and plts slightly up to 69. HIT panel pending   Goal of Therapy:  aPTT 50-85 seconds Monitor platelets by anticoagulation protocol: Yes   Plan:  Continue bivalirudin at 0.18mg /kg/hr (38.64ml/hr)  Monitor daily aPTT, CBC, s/s of bleed F/u HIT panel results  Elenor Quinones, PharmD, BCPS, BCIDP Clinical Pharmacist 03/05/2019 6:25 AM

## 2019-03-05 NOTE — Progress Notes (Signed)
Pharmacy Antibiotic Note  Clayton Sawyer is a 78 y.o. male admitted on 02/04/2019 withCOVID-19 pneumonia.  Currently on D#5 of Cefepime for pseudomonas/enterbacter pneumonia. WBC wnl, Afebrile   Plan: Cefepime 2g IV q8h. F/u LOT  Follow up renal function, culture results, and clinical course.   Height: 5\' 9"  (175.3 cm) Weight: 243 lb 2.7 oz (110.3 kg) IBW/kg (Calculated) : 70.7  Temp (24hrs), Avg:98.6 F (37 C), Min:97.7 F (36.5 C), Max:99.2 F (37.3 C)  Recent Labs  Lab 03/01/19 0450 03/02/19 0449 03/03/19 0430 03/04/19 0510 03/05/19 0500  WBC 22.8* 11.8* 10.3 10.3 9.2  CREATININE 1.12 0.94 0.98 0.94 0.78    Estimated Creatinine Clearance: 93.1 mL/min (by C-G formula based on SCr of 0.78 mg/dL).    Allergies  Allergen Reactions  . Heparin     HIT  . Lisinopril Cough  . Losartan Rash  . Statins Other (See Comments)    Body aches    Antimicrobials this admission: 7/31 Vancomycin >> 7/31 Cefepime >>  Dose adjustments this admission:   Microbiology results: 7/15 SARS Coronavirus 2: positive 7/15 [facility]): 1/4 bottles CONS +mec A (likely contaminant) 7/15 MRSA PCR: neg 7/16 BCx: 1/4 bottles staph hominis (likely contaminant)  Thank you for allowing pharmacy to be a part of this patient's care.  Albertina Parr, PharmD., BCPS Clinical Pharmacist Clinical phone for 03/05/19 until 5pm: [phone]

## 2019-03-05 NOTE — Progress Notes (Signed)
Patient's granddaughter Apolonio Schneiders called and was updated.  All questions answered.  Apolonio Schneiders placed on speaker phone per her request to say goodnight and prayer with patient.

## 2019-03-05 NOTE — Progress Notes (Addendum)
PROGRESS NOTE  Clayton Sawyer DQQ:[id number] DOB: 1941/01/28 DOA: 02/18/2019  PCP: Jodi Marble, MD  Brief History/Interval Summary: 78 year old male with hypertension, prostate cancer, prior CVA who was admitted to the hospital 02/19/2019 from [facility] with profound hypoxic respiratory failure in the setting of COVID-19, and admitted to the ICU requiring heated high flow nasal cannula. Patient was transferred to the floor on 7/23 as he wished to get out of the ICU and met criteria for the floor requiring 15 L high flow nasal cannula.  On the floor patient continued to have episodes of anxiety.  He had a lot of trouble maintaining his oxygen saturations.  This continued for a few days and then finally on 7/28 he got worse.  Multiple discussions held with patient as well as family members.  He was finally intubated and transferred to the ICU.  Reason for Visit: Acute respiratory disease due to COVID-19  Consultants: Pulmonology  Procedures:  Intubation 7/28 Chest tube placement 7/28  Antibiotics: Anti-infectives (From admission, onward)   Start     Dose/Rate Route Frequency Ordered Stop   03/02/19 1400  vancomycin (VANCOCIN) 1,250 mg in sodium chloride 0.9 % 250 mL IVPB  Status:  Discontinued     1,250 mg 166.7 mL/hr over 90 Minutes Intravenous Every 24 hours 03/01/19 1556 03/02/19 1552   03/01/19 1200  ceFEPIme (MAXIPIME) 2 g in sodium chloride 0.9 % 100 mL IVPB     2 g 200 mL/hr over 30 Minutes Intravenous Every 8 hours 03/01/19 1142     03/01/19 1200  vancomycin (VANCOCIN) 2,000 mg in sodium chloride 0.9 % 500 mL IVPB     2,000 mg 250 mL/hr over 120 Minutes Intravenous  Once 03/01/19 1142 03/01/19 1425   02/17/19 1000  vancomycin (VANCOCIN) 1,500 mg in sodium chloride 0.9 % 500 mL IVPB  Status:  Discontinued     1,500 mg 250 mL/hr over 120 Minutes Intravenous Every 24 hours 02/16/19 0934 02/17/19 1338   02/16/19 1000  vancomycin (VANCOCIN) 2,000 mg in sodium chloride 0.9 % 500  mL IVPB     2,000 mg 250 mL/hr over 120 Minutes Intravenous  Once 02/16/19 0934 02/16/19 1250   02/14/19 0700  remdesivir 100 mg in sodium chloride 0.9 % 250 mL IVPB     100 mg 500 mL/hr over 30 Minutes Intravenous Every 24 hours 02/14/19 0010 02/17/19 1625       Subjective/Interval History: Still having high sedation requirements as well as FiO2/PEEP.  Uneventful night.  Assessment/Plan:  Acute Hypoxic Resp. Failure due to Acute Covid 19 Viral Illness  Vent Mode: PCV FiO2 (%):  [70 %] 70 % Set Rate:  [35 bmp] 35 bmp PEEP:  [16 cmH20] 16 cmH20 Plateau Pressure:  [27 cmH20-45 cmH20] 27 cmH20     Component Value Date/Time   PHART 7.324 (L) 03/04/2019 0502   PCO2ART 65.1 (HH) 03/04/2019 0502   PO2ART 42.0 (L) 03/04/2019 0502   HCO3 33.7 (H) 03/04/2019 0502   TCO2 36 (H) 03/04/2019 0502   O2SAT 70.0 03/04/2019 0502    COVID-19 Labs  No results for input(s): DDIMER, FERRITIN, LDH, CRP in the last 72 hours.  Lab Results  Component Value Date   SARSCOV2NAA POSITIVE (A) 02/14/2019   SARSCOV2NAA Detected (A) 02/08/2019     Fever: Remains afebrile Oxygen requirements: Mechanical ventilation.  60% FiO2.  Saturating in the high 80s Antibacterials: Vancomycin and cefepime started 7/31 Remdesivir: Completed course Steroids: On Solu-Medrol 60 mg every 12 hours Diuretics:  Not on scheduled diuretics Actemra: He was given 2 doses Vitamin C and Zinc: Continue DVT Prophylaxis:  SCDs  Research Studies: He was enrolled into the fibrinogen research study by Dr. Chase Caller  Patient has been intubated since 7/28.  Remains on mechanical ventilation.  Pulmonology is following and managing.  He has completed treatment for COVID-19.  He is still on steroids however.  Inflammatory markers have been unremarkable for the last several days.  Still requiring high FiO2 and PEEP.  He has not made any meaningful progress.  Right Pneumothorax Has a chest tube.  Management per pulmonology.  HCAP  Patient was having fevers and elevated WBC count.  Started on cefepime.  Sputum culture positive for Enterobacter and Pseudomonas.  Overall fevers have improved and WBC count has trended down.  Continue current treatments.  Shock Patient is currently on norepinephrine infusion. Wean off as tolerated.  History of stroke Patient is chronically on Plavix which is being held due to thrombocytopenia.  Intolerant to statins.  Hyperglycemia Secondary to steroids.  SSI.  CBGs are reasonably well controlled.  Acute renal failure with hyperkalemia Potassium level is trending down.  Renal function is currently stable with a creatinine at 0.78.  Monitor volume status.  Recheck labs tomorrow.  Monitor urine output.  Currently he has fair urine output.  Hypernatremia Continue on free water flushes through G tube  Bacteremia 1 out of 4 bottles with coag negative staph.  Another bottle with staph hominis.  These are contaminants. Since he is having recurrent fever, blood cultures have been repeated.  They have not shown any growth as of yet  Thrombocytopenia Unclear etiology. Possibly related to acute illness.  Lovenox is on hold.  Discussed with critical care and concern for possible HIT.  HIT panel has been sent and is in process. Currently on angiomax  History of coronary artery disease with prior PCI/pacemaker Stable.  Obesity BMI 33.06.  History of prostate cancer Stable.  Essential hypertension Currently hypotensive, requiring vasopressors.  Severe anxiety Likely related to his acute illness.  Nutrition Continue tube feedings.  Goals of care. Patient is currently DNR.  Dr. Lake Bells and I spoke to patient's wife, Enid Derry today and explained that his prognosis is poor.  He has not made any meaningful improvement in the last week.  Recommendations were to transition the patient towards comfort measures.  Enid Derry will discuss this with her family and let us know their decision.  May need  palliative care involvement.Marland Kitchen   DVT Prophylaxis: scd, angiomax PUD Prophylaxis: Protonix Code Status: DNR Family Communication: updated wife 8/4 Disposition Plan: Remain in ICU   Medications:  Scheduled: . chlorhexidine gluconate (MEDLINE KIT)  15 mL Mouth Rinse BID  . Chlorhexidine Gluconate Cloth  6 each Topical Daily  . clonazePAM  2 mg Per Tube Q12H  . feeding supplement (PRO-STAT SUGAR FREE 64)  60 mL Per Tube TID  . free water  300 mL Per Tube Q6H  . insulin aspart  0-20 Units Subcutaneous Q4H  . insulin aspart  2 Units Subcutaneous Q4H  . mouth rinse  15 mL Mouth Rinse 10 times per day  . methylPREDNISolone sodium succinate  60 mg Intravenous Q12H  . multivitamin with minerals  1 tablet Per Tube Daily  . oxyCODONE  10 mg Oral Q6H  . pantoprazole sodium  40 mg Per Tube Daily  . polyethylene glycol  17 g Per Tube BID  . sodium chloride flush  10-40 mL Intracatheter Q12H  . sodium chloride  flush  3 mL Intravenous Q12H  . sodium chloride flush  3 mL Intravenous Q12H  . [START ON 03/13/2019] pamrevlumab or placebo  35 mg/kg Intravenous Once  . traZODone  100 mg Oral QHS   Continuous: . sodium chloride    . sodium chloride    . bivalirudin (ANGIOMAX) infusion 0.5 mg/mL (Non-ACS indications) 0.18 mg/kg/hr (03/05/19 1700)  . ceFEPime (MAXIPIME) IV 2 g (03/05/19 1431)  . dexmedetomidine (PRECEDEX) IV infusion 1.1 mcg/kg/hr (03/05/19 1700)  . dextrose    . feeding supplement (VITAL 1.5 CAL) 1,000 mL (03/05/19 1530)  . HYDROmorphone 3 mg/hr (03/05/19 1700)  . midazolam 3 mg/hr (03/05/19 1700)  . norepinephrine (LEVOPHED) Adult infusion 3 mcg/min (03/05/19 1700)   DXI:PJASNK chloride, sodium chloride, acetaminophen **OR** acetaminophen, guaiFENesin-codeine, guaiFENesin-dextromethorphan, hydrALAZINE, HYDROmorphone, Ipratropium-Albuterol, lip balm, midazolam, [DISCONTINUED] ondansetron **OR** ondansetron (ZOFRAN) IV, phenol, sodium chloride, sodium chloride flush, sodium chloride  flush, sodium chloride flush   Objective:  Vital Signs  Vitals:   03/05/19 1615 03/05/19 1630 03/05/19 1645 03/05/19 1700  BP: (!) 106/50 (!) 97/47 (!) 94/47 (!) 96/48  Pulse: 84 88 87 68  Resp: (!) 35 (!) 35 (!) 35 (!) 35  Temp:      TempSrc:      SpO2: 98% 97% 98% 95%  Weight:      Height:        Intake/Output Summary (Last 24 hours) at 03/05/2019 1743 Last data filed at 03/05/2019 1700 Gross per 24 hour  Intake 6846.68 ml  Output 3245 ml  Net 3601.68 ml   Filed Weights   03/03/19 0500 03/04/19 0500 03/05/19 0354  Weight: 106.4 kg 107.1 kg 110.3 kg    General exam: Intubated and sedated Respiratory system: Clear to auscultation. Respiratory effort normal.  Right chest chest tube Cardiovascular system:RRR. No murmurs, rubs, gallops. Gastrointestinal system: Abdomen is nondistended, soft and nontender. No organomegaly or masses felt. Normal bowel sounds heard. Central nervous system: Unable to assess due to sedation Extremities: Dependent edema present lower extremities Skin: No rashes, lesions or ulcers Psychiatry: Sedated   Lab Results:  Data Reviewed: I have personally reviewed following labs and imaging studies  CBC: Recent Labs  Lab 03/01/19 0450  03/02/19 0449 03/03/19 0430 03/04/19 0255 03/04/19 0404 03/04/19 0502 03/04/19 0510 03/05/19 0500  WBC 22.8*  --  11.8* 10.3  --   --   --  10.3 9.2  HGB 14.0   < > 12.7* 12.2* 13.3 12.9* 12.6* 11.6* 12.6*  HCT 46.4   < > 42.2 40.3 39.0 38.0* 37.0* 38.9* 41.8  MCV 100.2*  --  101.7* 102.8*  --   --   --  105.1* 103.7*  PLT 67*  --  65* 61*  --   --   --  59* 69*   < > = values in this interval not displayed.    Basic Metabolic Panel: Recent Labs  Lab 02/26/19 1955  02/27/19 0542  03/01/19 0450  03/02/19 0449 03/03/19 0430 03/04/19 0255 03/04/19 0404 03/04/19 0502 03/04/19 0510 03/05/19 0500  NA  --    < > 140   < > 143   < > 146* 147* 151* 150* 151* 153* 152*  K  --    < > 5.7*   < > 5.4*   < >  4.9 4.8 4.5 4.4 4.7 4.4 4.7  CL  --   --  102   < > 101  --  108 108  --   --   --  112* 112*  CO2  --   --  29   < > 35*  --  34* 34*  --   --   --  34* 35*  GLUCOSE  --   --  132*   < > 139*  --  166* 163*  --   --   --  159* 195*  BUN  --   --  33*   < > 52*  --  48* 51*  --   --   --  54* 50*  CREATININE  --   --  1.51*   < > 1.12  --  0.94 0.98  --   --   --  0.94 0.78  CALCIUM  --   --  7.4*   < > 8.0*  --  7.3* 7.6*  --   --   --  7.4* 8.1*  MG 2.5*  --  2.3  --   --   --   --   --   --   --   --   --   --   PHOS 3.6  --  6.5*  --   --   --   --   --   --   --   --   --   --    < > = values in this interval not displayed.    GFR: Estimated Creatinine Clearance: 93.1 mL/min (by C-G formula based on SCr of 0.78 mg/dL).  Liver Function Tests: Recent Labs  Lab 03/01/19 0450 03/02/19 0449 03/03/19 0430 03/04/19 0510 03/05/19 0500  AST 49* 30 28 30 28   ALT 46* 35 33 34 38  ALKPHOS 53 48 48 45 47  BILITOT 1.6* 1.0 1.0 0.8 0.9  PROT 5.1* 4.4* 4.3* 4.0* 4.4*  ALBUMIN 2.9* 2.4* 2.2* 2.1* 2.3*     CBG: Recent Labs  Lab 03/04/19 2334 03/05/19 0335 03/05/19 0734 03/05/19 1145 03/05/19 1543  GLUCAP 167* 154* 144* 135* 152*     Anemia Panel: No results for input(s): VITAMINB12, FOLATE, FERRITIN, TIBC, IRON, RETICCTPCT in the last 72 hours.   Radiology Studies: Dg Chest Port 1 View  Result Date: 03/04/2019 CLINICAL DATA:  Intubation EXAM: PORTABLE CHEST 1 VIEW COMPARISON:  03/03/2019 FINDINGS: Endotracheal tube is 3 cm above the carina. Right pacer remains in place, unchanged. Feeding tube remains in place. Cardiomegaly. Vascular congestion and bilateral lower lobe opacities are similar to prior study. Right chest tube remains in place. No pneumothorax. IMPRESSION: Endotracheal tube 3 cm above the carina. Bilateral lower lobe airspace opacities are similar prior study. Right chest tube remains in place.  No pneumothorax. Electronically Signed   By: Rolm Baptise M.D.   On:  03/04/2019 03:40    Critical care time: 35mns   LOS: 20 days   [employer] [email or web address]  03/05/2019, 5:43 PM  Addendum:  I spoke with patient's wife, SEnid Derry after she had a chance to discuss goals with the rest of her family.  I reiterated the patient has not made any meaningful progressing comfort measures would be the most appropriate course at this point.  SEnid Derryis having a difficult time coming to terms with this.  She asked certain questions such as "what is making him suffer/uncomfortable".  She says that she understands that he is uncomfortable at this time, but she will not be able to make this decision tonight.  She needs to pray on this decision and be at peace  with it.  She has a follow-up again tomorrow.  We will consult palliative care to assist in these conversations.  [employer]

## 2019-03-05 NOTE — Progress Notes (Signed)
OT Cancellation Note  Patient Details Name: Clayton Sawyer MRN: [id number] DOB: 05/15/1941   Cancelled Treatment:    Reason Eval/Treat Not Completed: Patient not medically ready (Intubated and sedated with O2 stats dropping. Will sign off and please re-consult as medically ready. Thank you.)  [place], OTR/L Acute Rehab Pager: [phone] Office: [phone] 03/05/2019, 1:04 PM

## 2019-03-05 NOTE — Progress Notes (Signed)
Spoke with patient's wife and provided full update/answered all questions.  

## 2019-03-05 NOTE — Progress Notes (Signed)
 Nutrition Follow-up   RD working remotely.   DOCUMENTATION CODES:   Obesity unspecified  INTERVENTION:   Tube Feeding:  Change to Vital 1.5 at 45 ml/hr Pro-Stat 60 mL TID Provides 2220 kcals, 163 g of protein and 821 mL of free water  Add MVI with Minerals daily  NUTRITION DIAGNOSIS:   Inadequate oral intake related to acute illness as evidenced by NPO status.  Being addressed TF   GOAL:   Patient will meet greater than or equal to 90% of their needs  Met  MONITOR:   TF tolerance, Vent status, Skin, Weight trends, Labs  REASON FOR ASSESSMENT:   Consult, Ventilator Enteral/tube feeding initiation and management  ASSESSMENT:   78 yo male admitted on 7/15 with acute respiratory failure due to COVID-19 virus, pt with decline in respiratory status and pneumothorax requring transfer to ICU and intubation on 7/28.  PMH includes stroke, HTN, CAD  7/15 Admit 7/27 R. Pneumothorax 7/28 Intubated, Chest tube placed 7/29 Cortrak, TF initiated  Pt with worsening hypoxemia and vent needs, agitated, requiring high doses of sedatives, poor prognosis per MD notes  Patient is currently intubated on ventilator support MV: 17.6 L/min Temp (24hrs), Avg:98.4 F (36.9 C), Min:97.7 F (36.5 C), Max:99.2 F (37.3 C)  Vital High Protein at 45 ml/hr, Pro-Stat 30 mL QID via Cortrak  Chest tube with min output  Net + 4 L since admission' current wt 110.3 kg; admit weight 105.7 kg; lowest weight 101.6 kg on 7/29  New DTI to sacrum   Hypernatremic, noted free water of 300 mL q 6 hours ordered yesterday  Labs: sodium 152 (H), Creatinine wdl Meds: solumedrol, ss novolog, novolog q 4 hours  Diet Order:   Diet Order            Diet NPO time specified  Diet effective now              EDUCATION NEEDS:   Not appropriate for education at this time  Skin:  Skin Assessment: Skin Integrity Issues: Skin Integrity Issues:: DTI DTI: sacrum  Last BM:  8/2  Height:   Ht  Readings from Last 1 Encounters:  02/26/19 _0  (1.753 m)    Weight:   Wt Readings from Last 1 Encounters:  03/05/19 110.3 kg    Ideal Body Weight:  72.7 kg  BMI:  Body mass index is 35.91 kg/m.  Estimated Nutritional Needs:   Kcal:  2225 kcals  Protein:  145-180 g  Fluid:  >/= 1.8 L     Karima Carrell MS, RDN, LDN, CNSC [phone] Pager  [phone] Weekend/On-Call Pager

## 2019-03-05 NOTE — Progress Notes (Signed)
NAME:  Clayton Sawyer, MRN:  [id number], DOB:  Apr 02, 1941, LOS: 51 ADMISSION DATE:  02/18/2019, CONSULTATION DATE: February 13, 2019 REFERRING MD: Dr. 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 Hyperlipidemia History of prostate cancer Obstructive sleep apnea  Significant Hospital Events   7/15 Admit, transfer to [facility] 7/16 enrolled in fibrinogen research drug  7/27 PCCM consulted again for worsening oxygenation, pneumothorax on right 7/28 transferred to the intensive care unit, full code, intubated, right-sided chest tube 7/31 worsening vent settings, severe hypoxemia, high sedation needs 8/2 Worsening ventilator dyssynchrony overnight when attempted to stop Precedex, started bivalrudin  Consults:  Pulmonary and critical care medicine  Procedures:  ETT 7/28 CVC 7/28 Pigtail R 7/28  Significant Diagnostic Tests:    Micro Data:  7/10, 7/15 SARS-CoV-2 positive 7/16 blood > coag neg staph in 1/4 7/31 blood >  7/31 resp > enterobacter, pseudomonas  Antimicrobials/COVID treatment  Ceftriaxone July 14 Azithromycin July 15 Remdesivir July 15 > July 19 Actemra July 15, July 19 Vancomycin July 18, July 19 Solumedrol July 14 Pamrevlumab vs placebo, next scheduled infusion July 29 Cefepime 7/31>   Interim history/subjective:   Some agitation Remains on high vent settings Increased ventilator dyssychrony this morning  Objective   Blood pressure (!) 95/57, pulse (!) 50, temperature 98.1 F (36.7 C), temperature source Axillary, resp. rate (!) 24, height 5\' 9"  (1.753 m), weight 110.3 kg, SpO2 (!) 88 %.    Vent Mode: PCV FiO2 (%):  [70 %-80 %] 70 % Set Rate:  [35 bmp] 35 bmp PEEP:  [16 cmH20] 16 cmH20 Plateau Pressure:  [36 cmH20-45 cmH20] 45 cmH20   Intake/Output  Summary (Last 24 hours) at 03/05/2019 1451 Last data filed at 03/05/2019 1300 Gross per 24 hour  Intake 5986.45 ml  Output 1305 ml  Net 4681.45 ml   Filed Weights   03/03/19 0500 03/04/19 0500 03/05/19 0354  Weight: 106.4 kg 107.1 kg 110.3 kg    Examination:  General:  In bed on vent HENT: NCAT ETT in place PULM: Rhonchi bilaterally B, vent supported breathing CV: RRR, no mgr GI: BS+, soft, nontender MSK: normal bulk and tone Neuro: sedated on vent   8/3 CXR images independently reviewed: unchanged bilateral airspace disease, pigtail catheter in place in right chest, pacemaker in place, ETT in place  Resolved Hospital Problem list     Assessment & Plan:  ARDS due to COVID-19 pneumonia, prognosis dismal, worsening oxygenation Right sided pneumothorax status post 14 French pigtail chest tube catheter HCAP: pseudomonas and enterobacter Continue cefepime Continue chest tube to suction - 20 cm H20 until extubated Continue mechanical ventilation per ARDS protocol Target TVol 6-8cc/kgIBW Target Plateau Pressure < 30cm H20 Target driving pressure less than 15 cm of water Target PaO2 55-65: titrate PEEP/FiO2 per protocol As long as PaO2 to FiO2 ratio is less than 1:150 position in prone position for 16 hours a day Check CVP daily if CVL in place Target CVP less than 4, diurese as necessary Ventilator associated pneumonia prevention protocol 8/4 plan: discuss goals of care with wife, continue sedation  Sedation needs while on mechanical ventilation: worsening dyssynchrony 8/4, fentanyl tachyphylaxis? Stop fentanyl Start dilaudid Continue versed, precedex Continue oxycodone and clonazepam RASS target -3  Thromboctytopenia: high probability HITT F/U HITT Ab  Prognosis:dismal  Goals of care:  discuss with wife today with Dr. Roderic Palau  Best practice:  Diet: tube feeding Pain/Anxiety/Delirium protocol (if indicated): yes, see above VAP protocol (if indicated): yes DVT  prophylaxis: SCD GI prophylaxis: Pantoprazole for stress ulcer prophylaxis Glucose control: per TRH Mobility: bed rest Code Status: DNR Family Communication: per [facility] Disposition: remain in ICU  Labs   CBC: Recent Labs  Lab 03/01/19 0450  03/02/19 0449 03/03/19 0430 03/04/19 0255 03/04/19 0404 03/04/19 0502 03/04/19 0510 03/05/19 0500  WBC 22.8*  --  11.8* 10.3  --   --   --  10.3 9.2  HGB 14.0   < > 12.7* 12.2* 13.3 12.9* 12.6* 11.6* 12.6*  HCT 46.4   < > 42.2 40.3 39.0 38.0* 37.0* 38.9* 41.8  MCV 100.2*  --  101.7* 102.8*  --   --   --  105.1* 103.7*  PLT 67*  --  65* 61*  --   --   --  59* 69*   < > = values in this interval not displayed.    Basic Metabolic Panel: Recent Labs  Lab 02/26/19 1955  02/27/19 0542  03/01/19 0450  03/02/19 0449 03/03/19 0430 03/04/19 0255 03/04/19 0404 03/04/19 0502 03/04/19 0510 03/05/19 0500  NA  --    < > 140   < > 143   < > 146* 147* 151* 150* 151* 153* 152*  K  --    < > 5.7*   < > 5.4*   < > 4.9 4.8 4.5 4.4 4.7 4.4 4.7  CL  --   --  102   < > 101  --  108 108  --   --   --  112* 112*  CO2  --   --  29   < > 35*  --  34* 34*  --   --   --  34* 35*  GLUCOSE  --   --  132*   < > 139*  --  166* 163*  --   --   --  159* 195*  BUN  --   --  33*   < > 52*  --  48* 51*  --   --   --  54* 50*  CREATININE  --   --  1.51*   < > 1.12  --  0.94 0.98  --   --   --  0.94 0.78  CALCIUM  --   --  7.4*   < > 8.0*  --  7.3* 7.6*  --   --   --  7.4* 8.1*  MG 2.5*  --  2.3  --   --   --   --   --   --   --   --   --   --   PHOS 3.6  --  6.5*  --   --   --   --   --   --   --   --   --   --    < > = values in this interval not displayed.   GFR: Estimated Creatinine Clearance: 93.1 mL/min (by C-G formula based on SCr of 0.78 mg/dL). Recent Labs  Lab 02/26/19 1955  03/01/19 0838 03/02/19 0449 03/03/19 0430 03/04/19 0510 03/05/19 0500  PROCALCITON 0.33  --  0.32 0.20 0.16  --   --   WBC  --    < >  --  11.8* 10.3 10.3 9.2   < > = values in  this interval not displayed.    Liver Function Tests: Recent Labs  Lab 03/01/19 0450 03/02/19 0449 03/03/19 0430 03/04/19 0510 03/05/19 0500  AST 49* 30 28 30 28   ALT 46* 35 33 34 38  ALKPHOS 53 48 48 45 47  BILITOT 1.6* 1.0 1.0 0.8 0.9  PROT 5.1* 4.4* 4.3* 4.0* 4.4*  ALBUMIN 2.9* 2.4* 2.2* 2.1* 2.3*   No results for input(s): LIPASE, AMYLASE in the last 168 hours. No results for input(s): AMMONIA in the last 168 hours.  ABG    Component Value Date/Time   PHART 7.324 (L) 03/04/2019 0502   PCO2ART 65.1 (HH) 03/04/2019 0502   PO2ART 42.0 (L) 03/04/2019 0502   HCO3 33.7 (H) 03/04/2019 0502   TCO2 36 (H) 03/04/2019 0502   O2SAT 70.0 03/04/2019 0502     Coagulation Profile: Recent Labs  Lab 03/04/19 0510 03/05/19 0500  INR 1.8* 1.7*    Cardiac Enzymes: No results for input(s): CKTOTAL, CKMB, CKMBINDEX, TROPONINI in the last 168 hours.  HbA1C: Hemoglobin A1C  Date/Time Value Ref Range Status  09/22/2012 02:49 AM 5.9 4.2 - 6.3 % Final    Comment:    The American Diabetes Association recommends that a primary goal of therapy should be <7% and that physicians should reevaluate the treatment regimen in patients with HbA1c values consistently >8%.     CBG: Recent Labs  Lab 03/04/19 2138 03/04/19 2334 03/05/19 0335 03/05/19 0734 03/05/19 1145  GLUCAP 171* 167* 154* 144* 135*     Critical care time: 31 minutes     Roselie Awkward, MD [place] Pager: [phone] Cell: [phone] If no response, call [phone]

## 2019-03-05 NOTE — Progress Notes (Signed)
[facility]  Dr. Roderic Palau and I spoke to [place] the patient's wife and explained that his condition has worsened this week.  His likelihood of survival of his ICU stay is near zero, and any chance of survival would involve weeks on a ventilator, tracheostomy, nursing home placement. The likelihood of surviving all of that is zero.  Given this she says that she believes he would not want to be maintained on life support, but she feels she needs to talk to her son and granddaughter prior to making any decisions.   Roselie Awkward, MD [facility] Pager: [phone] Cell: [phone] If no response, call [phone]

## 2019-03-06 LAB — COMPREHENSIVE METABOLIC PANEL
ALT: 45 U/L — ABNORMAL HIGH (ref 0–44)
AST: 31 U/L (ref 15–41)
Albumin: 2.4 g/dL — ABNORMAL LOW (ref 3.5–5.0)
Alkaline Phosphatase: 50 U/L (ref 38–126)
Anion gap: 2 — ABNORMAL LOW (ref 5–15)
BUN: 56 mg/dL — ABNORMAL HIGH (ref 8–23)
CO2: 37 mmol/L — ABNORMAL HIGH (ref 22–32)
Calcium: 8.2 mg/dL — ABNORMAL LOW (ref 8.9–10.3)
Chloride: 111 mmol/L (ref 98–111)
Creatinine, Ser: 0.91 mg/dL (ref 0.61–1.24)
GFR calc Af Amer: >60 mL/min (ref >60)
GFR calc non Af Amer: >60 mL/min (ref >60)
Glucose, Bld: 207 mg/dL — ABNORMAL HIGH (ref 70–99)
Potassium: 5.2 mmol/L — ABNORMAL HIGH (ref 3.5–5.1)
Sodium: 150 mmol/L — ABNORMAL HIGH (ref 135–145)
Total Bilirubin: 0.7 mg/dL (ref 0.3–1.2)
Total Protein: 4.7 g/dL — ABNORMAL LOW (ref 6.5–8.1)

## 2019-03-06 LAB — CBC
HCT: 42.9 % (ref 39.0–52.0)
Hemoglobin: 12.6 g/dL — ABNORMAL LOW (ref 13.0–17.0)
MCH: 30.4 pg (ref 26.0–34.0)
MCHC: 29.4 g/dL — ABNORMAL LOW (ref 30.0–36.0)
MCV: 103.4 fL — ABNORMAL HIGH (ref 80.0–100.0)
Platelets: 85 10*3/uL — ABNORMAL LOW (ref 150–400)
RBC: 4.15 MIL/uL — ABNORMAL LOW (ref 4.22–5.81)
RDW: 17.2 % — ABNORMAL HIGH (ref 11.5–15.5)
WBC: 10.4 10*3/uL (ref 4.0–10.5)
nRBC: 0 % (ref 0.0–0.2)

## 2019-03-06 LAB — GLUCOSE, CAPILLARY
Glucose-Capillary: 156 mg/dL — ABNORMAL HIGH (ref 70–99)
Glucose-Capillary: 164 mg/dL — ABNORMAL HIGH (ref 70–99)
Glucose-Capillary: 185 mg/dL — ABNORMAL HIGH (ref 70–99)
Glucose-Capillary: 191 mg/dL — ABNORMAL HIGH (ref 70–99)
Glucose-Capillary: 204 mg/dL — ABNORMAL HIGH (ref 70–99)
Glucose-Capillary: 219 mg/dL — ABNORMAL HIGH (ref 70–99)

## 2019-03-06 LAB — CULTURE, BLOOD (ROUTINE X 2)
Culture: NO GROWTH
Culture: NO GROWTH
Special Requests: ADEQUATE
Special Requests: ADEQUATE

## 2019-03-06 LAB — PROTIME-INR
INR: 1.5 — ABNORMAL HIGH (ref 0.8–1.2)
Prothrombin Time: 18.2 seconds — ABNORMAL HIGH (ref 11.4–15.2)

## 2019-03-06 LAB — APTT: aPTT: 50 seconds — ABNORMAL HIGH (ref 24–36)

## 2019-03-06 MED ORDER — ENOXAPARIN SODIUM 60 MG/0.6ML ~~LOC~~ SOLN: 0.5000 mg/kg | SUBCUTANEOUS | Status: DC

## 2019-03-06 MED ORDER — ACETAMINOPHEN 325 MG PO TABS
650.0000 mg | ORAL_TABLET | Freq: Four times a day (QID) | ORAL | Status: DC | PRN
  Administered 2019-03-14 – 2019-03-16 (×3): 650 mg
  Filled 2019-03-06 (×4): qty 2

## 2019-03-06 MED ORDER — ACETAMINOPHEN 650 MG RE SUPP: 650.0000 mg | Freq: Four times a day (QID) | RECTAL | Status: DC | PRN

## 2019-03-06 MED ORDER — FUROSEMIDE 10 MG/ML IJ SOLN
40.0000 mg | Freq: Every day | INTRAMUSCULAR | Status: DC
  Administered 2019-03-06: 13:00:00 40 mg via INTRAVENOUS
  Filled 2019-03-06: qty 4

## 2019-03-06 MED ORDER — INSULIN ASPART 100 UNIT/ML ~~LOC~~ SOLN
3.0000 [IU] | SUBCUTANEOUS | Status: DC
  Administered 2019-03-06 – 2019-03-08 (×13): 3 [IU] via SUBCUTANEOUS

## 2019-03-06 MED ORDER — ENOXAPARIN SODIUM 60 MG/0.6ML ~~LOC~~ SOLN
0.5000 mg/kg | Freq: Two times a day (BID) | SUBCUTANEOUS | Status: DC
  Administered 2019-03-06 – 2019-03-13 (×16): 55 mg via SUBCUTANEOUS
  Filled 2019-03-06 (×17): qty 0.6

## 2019-03-06 NOTE — Progress Notes (Signed)
Patient's son, Pacen Watford, called and wanted to speak with Dr.MCQuaid today. Message relayed to [facility] along with his phone number.

## 2019-03-06 NOTE — Progress Notes (Signed)
Palliative care:  Consult received and chart reviewed extensively. Family has heard from multiple providers during previous days to discuss goals of care - have remained resistant to comfort care recommendations and request a few more days of aggressive treatment. Discussed with Dr. Bonner Puna - will hold off on consult for now per his recommendation and check back in later in the week.   Juel Burrow, DNP, AGNP-C Palliative Medicine Team Team Phone # [phone]  Pager # [phone]  NO CHARGE

## 2019-03-06 NOTE — Progress Notes (Signed)
ANTICOAGULATION CONSULT NOTE - Follow Up Consult  Pharmacy Consult for Bivalirudin Indication: r/o HIT  Allergies  Allergen Reactions  . Heparin     HIT  . Lisinopril Cough  . Losartan Rash  . Statins Other (See Comments)    Body aches   Patient Measurements: Height: 5\' 9"  (175.3 cm) Weight: 243 lb 2.7 oz (110.3 kg) IBW/kg (Calculated) : 70.7  Vital Signs: Temp: 98.2 F (36.8 C) (08/05 0400) Temp Source: Oral (08/05 0400) BP: 93/51 (08/05 0430) Pulse Rate: 42 (08/05 0430)  Labs: Recent Labs    03/04/19 0510 03/05/19 0500 03/06/19 0400  HGB 11.6* 12.6* 12.6*  HCT 38.9* 41.8 42.9  PLT 59* 69* 85*  APTT 55* 55* 50*  LABPROT 20.8* 19.7* 18.2*  INR 1.8* 1.7* 1.5*  CREATININE 0.94 0.78  --     Estimated Creatinine Clearance: 93.1 mL/min (by C-G formula based on SCr of 0.78 mg/dL).  Assessment: 78 y.o. M well known to pharmacy from ICU monitoring. Pt now with thrombocytopenia, plt down to 61. Admit and started Hep/Enox on 7/15; Plt fall starts 7/26, day #12 post heparin/lovenox start (although no CBC done 7/24 or 7/25 so hard to tell exact date of drop). Calculate 4T score 4-5 so intermediate probability of HIT. Pharmacy consulted to begin [place].  APTT remains therapeutic at 50 on 0.18 mg/kg/hr. H/H stable and plts slightly up to 69. HIT panel negative.  Goal of Therapy:  aPTT 50-85 seconds Monitor platelets by anticoagulation protocol: Yes   Plan:  Increase bivalirudin slightly to 0.2mg /kg/hr (42.28ml/hr)  Monitor daily aPTT, CBC, s/s of bleed  Consider stopping bivalirudin and switching back to Lovenox for DVT px since HIT panel negative  Elenor Quinones, PharmD, BCPS, BCIDP Clinical Pharmacist 03/06/2019 5:37 AM

## 2019-03-06 NOTE — Progress Notes (Signed)
Spoke to patient's wife on phone around [place], updated and answered questions. She wanted to speak to patient so placed phone to patient's ear and let her talk to him.

## 2019-03-06 NOTE — Progress Notes (Signed)
NAME:  Clayton Sawyer, MRN:  [id number], DOB:  August 18, 1940, LOS: 21 ADMISSION DATE:  02/07/2019, CONSULTATION DATE: February 13, 2019 REFERRING MD: Dr. 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 Hyperlipidemia History of prostate cancer Obstructive sleep apnea  Significant Hospital Events   7/15 Admit, transfer to [facility] 7/16 enrolled in fibrinogen research drug  7/27 PCCM consulted again for worsening oxygenation, pneumothorax on right 7/28 transferred to the intensive care unit, full code, intubated, right-sided chest tube 7/31 worsening vent settings, severe hypoxemia, high sedation needs 8/2 Worsening ventilator dyssynchrony overnight when attempted to stop Precedex, started bivalrudin  Consults:  Pulmonary and critical care medicine  Procedures:  ETT 7/28 CVC 7/28 Pigtail R 7/28  Significant Diagnostic Tests:    Micro Data:  7/10, 7/15 SARS-CoV-2 positive 7/16 blood > coag neg staph in 1/4 7/31 blood >  7/31 resp > enterobacter, pseudomonas  Antimicrobials/COVID treatment  Ceftriaxone July 14 Azithromycin July 15 Remdesivir July 15 > July 19 Actemra July 15, July 19 Vancomycin July 18, July 19 Solumedrol July 14 Pamrevlumab vs placebo, next scheduled infusion July 29 Cefepime 7/31>   Interim history/subjective:   Worsening respiratory failure   Objective   Blood pressure (!) 132/56, pulse 89, temperature 100 F (37.8 C), temperature source Oral, resp. rate (!) 35, height 5\' 9"  (1.753 m), weight 111.4 kg, SpO2 93 %.    Vent Mode: PCV FiO2 (%):  [70 %] 70 % Set Rate:  [35 bmp] 35 bmp PEEP:  [16 cmH20] 16 cmH20 Plateau Pressure:  [27 cmH20-45 cmH20] 43 cmH20   Intake/Output Summary (Last 24 hours) at 03/06/2019 0810 Last data filed at 03/06/2019 0700 Gross  per 24 hour  Intake 4154.1 ml  Output 3285 ml  Net 869.1 ml   Filed Weights   03/04/19 0500 03/05/19 0354 03/06/19 0500  Weight: 107.1 kg 110.3 kg 111.4 kg    Examination:  General:  In bed on vent HENT: NCAT ETT in place PULM: Crackles B, vent supported breathing CV: RRR, no mgr GI: BS+, soft, nontender MSK: normal bulk and tone Neuro: sedated on vent  8/3 CXR images independently reviewed: unchanged bilateral airspace disease, pigtail catheter in place in right chest, pacemaker in place, ETT in place  Resolved Hospital Problem list     Assessment & Plan:  ARDS due to COVID-19 pneumonia, prognosis dismal, worsening oxygenation Right sided pneumothorax status post 14 French pigtail chest tube catheter HCAP: pseudomonas and enterobacter Continue cefepime Continue chest tube to suction -20 cm H20 until extubated Continue mechanical ventilation per ARDS protocol Target TVol 6-8cc/kgIBW Target Plateau Pressure < 30cm H20 Target driving pressure less than 15 cm of water Target PaO2 55-65: titrate PEEP/FiO2 per protocol As long as PaO2 to FiO2 ratio is less than 1:150 position in prone position for 16 hours a day Check CVP daily if CVL in place Target CVP less than 4, diurese as necessary Ventilator associated pneumonia prevention protocol 8/5 plan: continue care as above, continue conversation with wife about goals of care  Sedation needs while on mechanical ventilation: worsening dyssynchrony 8/4, fentanyl tachyphylaxis? Continue dilaudid, versed, precedex Continue oxycodone and clonazepam RASS target -3  Thromboctytopenia: not HITT Change back to routine anticoagulation Monitor for bleeding  Prognosis:dismal   Dr. Bonner Puna and I spoke to Coralyn Mark, the patient's son, today  at length about his father's condition and re-iterated that he has made no progress on the ventilator.  We stated that he is requiring multiple forms of life support and his prognosis is poor given the  duration of the illness, his age, and his comorbid conditions.  Coralyn Mark requested a transfer to another facility.  As such I called [facility] and they stated that they would offer the same level of care, don't have beds available, so could not accept the patient for transfer.  I let Coralyn Mark know that this was the case and that if he wanted his father transferred to another facility he needed to find an accepting physician.  We need to consult palliative medicine to continue to engage the family.  At this point he has not improved and the care he is receiving is medically non-beneficial.    Best practice:  Diet: tube feeding Pain/Anxiety/Delirium protocol (if indicated): yes, see above VAP protocol (if indicated): yes DVT prophylaxis: SCD GI prophylaxis: Pantoprazole for stress ulcer prophylaxis Glucose control: per TRH Mobility: bed rest Code Status: DNR Family Communication: per [facility] Disposition: remain in ICU  Labs   CBC: Recent Labs  Lab 03/02/19 0449 03/03/19 0430  03/04/19 0404 03/04/19 0502 03/04/19 0510 03/05/19 0500 03/06/19 0400  WBC 11.8* 10.3  --   --   --  10.3 9.2 10.4  HGB 12.7* 12.2*   < > 12.9* 12.6* 11.6* 12.6* 12.6*  HCT 42.2 40.3   < > 38.0* 37.0* 38.9* 41.8 42.9  MCV 101.7* 102.8*  --   --   --  105.1* 103.7* 103.4*  PLT 65* 61*  --   --   --  59* 69* 85*   < > = values in this interval not displayed.    Basic Metabolic Panel: Recent Labs  Lab 03/02/19 0449 03/03/19 0430  03/04/19 0404 03/04/19 0502 03/04/19 0510 03/05/19 0500 03/06/19 0400  NA 146* 147*   < > 150* 151* 153* 152* 150*  K 4.9 4.8   < > 4.4 4.7 4.4 4.7 5.2*  CL 108 108  --   --   --  112* 112* 111  CO2 34* 34*  --   --   --  34* 35* 37*  GLUCOSE 166* 163*  --   --   --  159* 195* 207*  BUN 48* 51*  --   --   --  54* 50* 56*  CREATININE 0.94 0.98  --   --   --  0.94 0.78 0.91  CALCIUM 7.3* 7.6*  --   --   --  7.4* 8.1* 8.2*   < > = values in this interval not displayed.    GFR: Estimated Creatinine Clearance: 82.3 mL/min (by C-G formula based on SCr of 0.91 mg/dL). Recent Labs  Lab 03/01/19 0838 03/02/19 0449 03/03/19 0430 03/04/19 0510 03/05/19 0500 03/06/19 0400  PROCALCITON 0.32 0.20 0.16  --   --   --   WBC  --  11.8* 10.3 10.3 9.2 10.4    Liver Function Tests: Recent Labs  Lab 03/02/19 0449 03/03/19 0430 03/04/19 0510 03/05/19 0500 03/06/19 0400  AST 30 28 30 28 31   ALT 35 33 34 38 45*  ALKPHOS 48 48 45 47 50  BILITOT 1.0 1.0 0.8 0.9 0.7  PROT 4.4* 4.3* 4.0* 4.4* 4.7*  ALBUMIN 2.4* 2.2* 2.1* 2.3* 2.4*   No results for input(s): LIPASE, AMYLASE in the last 168 hours. No results for input(s): AMMONIA  in the last 168 hours.  ABG    Component Value Date/Time   PHART 7.324 (L) 03/04/2019 0502   PCO2ART 65.1 (HH) 03/04/2019 0502   PO2ART 42.0 (L) 03/04/2019 0502   HCO3 33.7 (H) 03/04/2019 0502   TCO2 36 (H) 03/04/2019 0502   O2SAT 70.0 03/04/2019 0502     Coagulation Profile: Recent Labs  Lab 03/04/19 0510 03/05/19 0500 03/06/19 0400  INR 1.8* 1.7* 1.5*    Cardiac Enzymes: No results for input(s): CKTOTAL, CKMB, CKMBINDEX, TROPONINI in the last 168 hours.  HbA1C: Hemoglobin A1C  Date/Time Value Ref Range Status  09/22/2012 02:49 AM 5.9 4.2 - 6.3 % Final    Comment:    The American Diabetes Association recommends that a primary goal of therapy should be <7% and that physicians should reevaluate the treatment regimen in patients with HbA1c values consistently >8%.     CBG: Recent Labs  Lab 03/05/19 1543 03/05/19 1936 03/06/19 0012 03/06/19 0356 03/06/19 0730  GLUCAP 152* 150* 185* 204* 164*     Critical care time: 40 minutes     Roselie Awkward, MD [place] Pager: [phone] Cell: [phone] If no response, call [phone]

## 2019-03-06 NOTE — Progress Notes (Signed)
PROGRESS NOTE  Clayton Sawyer  HOZ:[id number] DOB: 10-15-1940 DOA: 02/02/2019 PCP: Jodi Marble, MD   Brief Narrative: Clayton Sawyer is a 78 y.o. male with a history of HTN, CAD s/p PCI, PPM, and prostate CA who presented to [facility] with shortness of breath and subsequently admitted to [facility] on 02/28/2019 due to hypoxemic respiratory failure due to covid-19 infection, requiring heated HFNC in the ICU. Steroids were started, remdesivir given for 5 days, and tocilizumab given x2. Inflammatory markers improved, and hypoxemia remained stable. He was transferred out of the ICU 7/23 but subsequently decompensated. After extensive goals of care discussions with the patient, full code status was reinstated and he was transferred back to the ICU requiring intubation and chest tube placement for pneumothorax on 7/28.  With rising leukocytosis, sputum culture was sent and grew pseudomonas and enterobacter which were treated with vancomycin and cefepime initially, now completing a course of cefepime alone based on susceptibility data. Steroids have continued, though the patient has made no clinical progress in the past week. Goals of care discussions were continued, though the family continues to wish for full measures. He is DNR.  Assessment & Plan: Principal Problem:   Acute respiratory disease due to COVID-19 virus Active Problems:   Presence of cardiac pacemaker   History of stroke   Essential (primary) hypertension   Atherosclerotic heart disease of native coronary artery without angina pectoris/PCI stents 09/2004, 03/2009 and 10/2011   COVID-19 virus infection   Dyspnea and respiratory abnormalities   Obesity (BMI 30-39.9)   Anxiety disorder   Thrombocytopenia (HCC)   Pressure injury of skin  Acute hypoxic respiratory failure due to Covid-19 infection: s/p tocilizumab x2, remdesivir x5 days.  - Continue IV steroids, though inflammatory marker normalization argues that the current process is  irreversible. - Also was enrolled in fibrogen study at admission - Vitamin C and zinc  - Continue airborne, contact precautions. - Check labs: CBC w/diff, CMP, d-dimer, ferritin, CRP, PCT - Keep prone as tolerated - Goals of care were discussed with wife by phone today. Prognosis is guarded, exceptionally poor.  - Continue ventilator settings and sedation per CCM. Oxygenation continues to be difficult with high FiO2/PEEP requirements. Will trial lasix  Septic shock due to HCAP due to pseudomonas and enterobacter:  - Continue cefepime per susceptibility data (started 7/31), also received vancomycin x2 days. - Blood cultures drawn 7/31, negative at 4 days. Defervescing and leukocytosis has resolved. Initially had contaminants in blood cultures at admission. - Wean NE as able.  Right pneumothorax: s/p chest tube 7/28.  - Management per PCCM  Acute renal failure with hyperkalemia: - Give IV lasix  Steroid-induced hyperglycemia:  - Augment SSI, now on 3u +resistant SSI q4h.  Hypernatremia:  - Continue free water per tube and monitoring Na.   Thrombocytopenia: Unclear etiology. Stabilizing. - HIT panel negative, on bivalirudin.   Stage I deep tissue injury: Not POA. Noted 8/1.   CAD s/p PCI: Stable.   Anxiety: Treat as indicated.  - Clonazepam per tube  Prostate CA: Quiescent.   Obesity: BMI 36.  - Poor prognostic indicator.  - Continue tube feeding with SSI coverage to optimize glycemic control.  DVT prophylaxis: SCDs, bivalirudin Code Status: DNR Family Communication: Wife by phone this morning. Discussed the option of withdrawal of care though she is resistant at this time. Wishes for a few more days of aggressive treatment before considering this plan. Disposition Plan: Uncertain, poor prognosis.  Consultants:   PCCM  Procedures:   Intubation 7/28 >>  Right chest tube 7/28 >>  Antimicrobials:  Remdesivir  Vancomycin  Cefepime   Subjective: Sedated,  still very high oxygenation requirements. BP borderline but coming off levophed.  Objective: Vitals:   03/06/19 0845 03/06/19 0900 03/06/19 0930 03/06/19 1000  BP: 124/65 129/60 104/65 (!) 90/48  Pulse: (!) 41 100 (!) 43 (!) 38  Resp: (!) 35 (!) 23 (!) 28 (!) 35  Temp:      TempSrc:      SpO2: 92% 92% 90% (!) 89%  Weight:      Height:        Intake/Output Summary (Last 24 hours) at 03/06/2019 1119 Last data filed at 03/06/2019 1000 Gross per 24 hour  Intake 4106.05 ml  Output 3475 ml  Net 631.05 ml   Filed Weights   03/04/19 0500 03/05/19 0354 03/06/19 0500  Weight: 107.1 kg 110.3 kg 111.4 kg    Gen: Elderly male in no distress  Pulm: Vent-assisted breaths, coarse.  CV: Regular bradycardia. No murmur, rub, or gallop. No JVD, trace pedal edema. GI: Abdomen soft, non-distended, with normoactive bowel sounds. No organomegaly or masses felt. Ext: Warm, no deformities Skin: No rashes, lesions or ulcers on visualized skin, not rolled today. Neuro: Sedated Psych: UTD  Data Reviewed: I have personally reviewed following labs and imaging studies  CBC: Recent Labs  Lab 03/02/19 0449 03/03/19 0430  03/04/19 0404 03/04/19 0502 03/04/19 0510 03/05/19 0500 03/06/19 0400  WBC 11.8* 10.3  --   --   --  10.3 9.2 10.4  HGB 12.7* 12.2*   < > 12.9* 12.6* 11.6* 12.6* 12.6*  HCT 42.2 40.3   < > 38.0* 37.0* 38.9* 41.8 42.9  MCV 101.7* 102.8*  --   --   --  105.1* 103.7* 103.4*  PLT 65* 61*  --   --   --  59* 69* 85*   < > = values in this interval not displayed.   Basic Metabolic Panel: Recent Labs  Lab 03/02/19 0449 03/03/19 0430  03/04/19 0404 03/04/19 0502 03/04/19 0510 03/05/19 0500 03/06/19 0400  NA 146* 147*   < > 150* 151* 153* 152* 150*  K 4.9 4.8   < > 4.4 4.7 4.4 4.7 5.2*  CL 108 108  --   --   --  112* 112* 111  CO2 34* 34*  --   --   --  34* 35* 37*  GLUCOSE 166* 163*  --   --   --  159* 195* 207*  BUN 48* 51*  --   --   --  54* 50* 56*  CREATININE 0.94 0.98   --   --   --  0.94 0.78 0.91  CALCIUM 7.3* 7.6*  --   --   --  7.4* 8.1* 8.2*   < > = values in this interval not displayed.   GFR: Estimated Creatinine Clearance: 82.3 mL/min (by C-G formula based on SCr of 0.91 mg/dL). Liver Function Tests: Recent Labs  Lab 03/02/19 0449 03/03/19 0430 03/04/19 0510 03/05/19 0500 03/06/19 0400  AST _0 ALT 35 33 34 38 45*  ALKPHOS 48 48 45 47 50  BILITOT 1.0 1.0 0.8 0.9 0.7  PROT 4.4* 4.3* 4.0* 4.4* 4.7*  ALBUMIN 2.4* 2.2* 2.1* 2.3* 2.4*   No results for input(s): LIPASE, AMYLASE in the last 168 hours. No results for input(s): AMMONIA in the last 168 hours. Coagulation Profile: Recent Labs  Lab 03/04/19 0510 03/05/19 0500 03/06/19 0400  INR 1.8* 1.7* 1.5*   Cardiac Enzymes: No results for input(s): CKTOTAL, CKMB, CKMBINDEX, TROPONINI in the last 168 hours. BNP (last 3 results) No results for input(s): PROBNP in the last 8760 hours. HbA1C: No results for input(s): HGBA1C in the last 72 hours. CBG: Recent Labs  Lab 03/05/19 1543 03/05/19 1936 03/06/19 0012 03/06/19 0356 03/06/19 0730  GLUCAP 152* 150* 185* 204* 164*   Lipid Profile: No results for input(s): CHOL, HDL, LDLCALC, TRIG, CHOLHDL, LDLDIRECT in the last 72 hours. Thyroid Function Tests: No results for input(s): TSH, T4TOTAL, FREET4, T3FREE, THYROIDAB in the last 72 hours. Anemia Panel: No results for input(s): VITAMINB12, FOLATE, FERRITIN, TIBC, IRON, RETICCTPCT in the last 72 hours. Urine analysis:    Component Value Date/Time   COLORURINE YELLOW 03/01/2019 1530   APPEARANCEUR HAZY (A) 03/01/2019 1530   APPEARANCEUR Clear 04/18/2013 2216   LABSPEC 1.024 03/01/2019 1530   LABSPEC 1.025 04/18/2013 2216   PHURINE 5.0 03/01/2019 1530   GLUCOSEU NEGATIVE 03/01/2019 1530   GLUCOSEU Negative 04/18/2013 2216   HGBUR SMALL (A) 03/01/2019 1530   BILIRUBINUR NEGATIVE 03/01/2019 1530   BILIRUBINUR Negative 04/18/2013 2216   KETONESUR NEGATIVE 03/01/2019  1530   PROTEINUR NEGATIVE 03/01/2019 1530   NITRITE POSITIVE (A) 03/01/2019 1530   LEUKOCYTESUR MODERATE (A) 03/01/2019 1530   LEUKOCYTESUR Negative 04/18/2013 2216   Recent Results (from the past 240 hour(s))  MRSA PCR Screening     Status: None   Collection Time: 03/01/19  1:18 PM   Specimen: Nasal Mucosa; Nasopharyngeal  Result Value Ref Range Status   MRSA by PCR NEGATIVE NEGATIVE Final    Comment:        The GeneXpert MRSA Assay (FDA approved for NASAL specimens only), is one component of a comprehensive MRSA colonization surveillance program. It is not intended to diagnose MRSA infection nor to guide or monitor treatment for MRSA infections. Performed at [facility], [place] [address], [place], [place] [address]   Culture, blood (routine x 2)     Status: None (Preliminary result)   Collection Time: 03/01/19  2:17 PM   Specimen: BLOOD  Result Value Ref Range Status   Specimen Description   Final    BLOOD RIGHT ARM Performed at [place] [address] [place], [place] [address]    Special Requests   Final    BOTTLES DRAWN AEROBIC ONLY Blood Culture adequate volume Performed at [place] [address] [place], [place] [address]    Culture   Final    NO GROWTH 4 DAYS Performed at [facility], [place] [address], [place], [place] [address]    Report Status PENDING  Incomplete  Culture, blood (routine x 2)     Status: None (Preliminary result)   Collection Time: 03/01/19  2:27 PM   Specimen: BLOOD  Result Value Ref Range Status   Specimen Description   Final    BLOOD RIGHT HAND Performed at [facility] [address] [place], [place] [address]    Special Requests   Final    BOTTLES DRAWN AEROBIC ONLY Blood Culture adequate volume Performed at [facility] [place] [address] [place], [place] [address]    Culture   Final    NO GROWTH 4 DAYS Performed at  [facility], [place] [address], [place], [place] [address]    Report Status PENDING  Incomplete  Culture,  respiratory     Status: None   Collection Time: 03/01/19  3:32 PM   Specimen: Tracheal Aspirate  Result Value Ref Range Status   Specimen Description   Final    TRACHEAL ASPIRATE Performed at [place] [address] [place], [place] [address]    Special Requests   Final    NONE Performed at [facility], [place] [address], [place], [place] [address]    Gram Stain   Final    ABUNDANT WBC PRESENT,BOTH PMN AND MONONUCLEAR MODERATE GRAM NEGATIVE RODS FEW GRAM POSITIVE RODS FEW GRAM POSITIVE COCCI Performed at [facility], [place] [address] [place], [place] [address]    Culture   Final    MODERATE ENTEROBACTER CLOACAE MODERATE PSEUDOMONAS AERUGINOSA    Report Status 03/04/2019 FINAL  Final   Organism ID, Bacteria ENTEROBACTER CLOACAE  Final   Organism ID, Bacteria PSEUDOMONAS AERUGINOSA  Final      Susceptibility   Enterobacter cloacae - MIC*    CEFAZOLIN >=64 RESISTANT Resistant     CEFEPIME <=1 SENSITIVE Sensitive     CEFTAZIDIME <=1 SENSITIVE Sensitive     CEFTRIAXONE <=1 SENSITIVE Sensitive     CIPROFLOXACIN <=0.25 SENSITIVE Sensitive     GENTAMICIN <=1 SENSITIVE Sensitive     IMIPENEM 0.5 SENSITIVE Sensitive     TRIMETH/SULFA <=20 SENSITIVE Sensitive     PIP/TAZO <=4 SENSITIVE Sensitive     * MODERATE ENTEROBACTER CLOACAE   Pseudomonas aeruginosa - MIC*    CEFTAZIDIME 2 SENSITIVE Sensitive     CIPROFLOXACIN <=0.25 SENSITIVE Sensitive     GENTAMICIN <=1 SENSITIVE Sensitive     IMIPENEM 2 SENSITIVE Sensitive     PIP/TAZO <=4 SENSITIVE Sensitive     CEFEPIME <=1 SENSITIVE Sensitive     * MODERATE PSEUDOMONAS AERUGINOSA      Radiology Studies: No results found.  Scheduled Meds: . chlorhexidine gluconate (MEDLINE KIT)  15 mL Mouth Rinse BID  . Chlorhexidine Gluconate Cloth  6 each Topical Daily  . clonazePAM  2  mg Per Tube Q12H  . feeding supplement (PRO-STAT SUGAR FREE 64)  60 mL Per Tube TID  . free water  300 mL Per Tube Q6H  . insulin aspart  0-20 Units Subcutaneous Q4H  . insulin aspart  2 Units Subcutaneous Q4H  . mouth rinse  15 mL Mouth Rinse 10 times per day  . methylPREDNISolone sodium succinate  60 mg Intravenous Q12H  . multivitamin with minerals  1 tablet Per Tube Daily  . oxyCODONE  10 mg Oral Q6H  . pantoprazole sodium  40 mg Per Tube Daily  . polyethylene glycol  17 g Per Tube BID  . sodium chloride flush  10-40 mL Intracatheter Q12H  . sodium chloride flush  3 mL Intravenous Q12H  . sodium chloride flush  3 mL Intravenous Q12H  . [START ON 03/13/2019] pamrevlumab or placebo  35 mg/kg Intravenous Once  . traZODone  100 mg Oral QHS   Continuous Infusions: . sodium chloride    . sodium chloride    . bivalirudin (ANGIOMAX) infusion 0.5 mg/mL (Non-ACS indications) 0.2 mg/kg/hr (03/06/19 1000)  . ceFEPime (MAXIPIME) IV 2 g (03/06/19 0532)  . dexmedetomidine (PRECEDEX) IV infusion 1 mcg/kg/hr (03/06/19 1000)  . dextrose    . feeding supplement (VITAL 1.5 CAL) 1,000 mL (03/05/19 1530)  . HYDROmorphone 2 mg/hr (03/06/19 1000)  . midazolam Stopped (03/06/19 0910)  . norepinephrine (LEVOPHED) Adult infusion Stopped (03/06/19 0847)  LOS: 21 days   Time spent: 35 minutes.  Patrecia Pour, MD [facility] [email or web address] Password [email or web address] 03/06/2019, 11:19 AM

## 2019-03-07 DIAGNOSIS — J9601 Acute respiratory failure with hypoxia: Secondary | ICD-10-CM

## 2019-03-07 LAB — CBC WITH DIFFERENTIAL/PLATELET
Abs Immature Granulocytes: 0.06 K/uL (ref 0.00–0.07)
Basophils Absolute: 0 K/uL (ref 0.0–0.1)
Basophils Relative: 0 %
Eosinophils Absolute: 0 K/uL (ref 0.0–0.5)
Eosinophils Relative: 0 %
HCT: 40 % (ref 39.0–52.0)
Hemoglobin: 11.6 g/dL — ABNORMAL LOW (ref 13.0–17.0)
Immature Granulocytes: 1 %
Lymphocytes Relative: 5 %
Lymphs Abs: 0.4 K/uL — ABNORMAL LOW (ref 0.7–4.0)
MCH: 30.3 pg (ref 26.0–34.0)
MCHC: 29 g/dL — ABNORMAL LOW (ref 30.0–36.0)
MCV: 104.4 fL — ABNORMAL HIGH (ref 80.0–100.0)
Monocytes Absolute: 0.2 K/uL (ref 0.1–1.0)
Monocytes Relative: 3 %
Neutro Abs: 8.1 K/uL — ABNORMAL HIGH (ref 1.7–7.7)
Neutrophils Relative %: 91 %
Platelets: 78 K/uL — ABNORMAL LOW (ref 150–400)
RBC: 3.83 MIL/uL — ABNORMAL LOW (ref 4.22–5.81)
RDW: 17 % — ABNORMAL HIGH (ref 11.5–15.5)
WBC: 8.8 K/uL (ref 4.0–10.5)
nRBC: 0 % (ref 0.0–0.2)

## 2019-03-07 LAB — COMPREHENSIVE METABOLIC PANEL
ALT: 49 U/L — ABNORMAL HIGH (ref 0–44)
AST: 31 U/L (ref 15–41)
Albumin: 2.2 g/dL — ABNORMAL LOW (ref 3.5–5.0)
Alkaline Phosphatase: 42 U/L (ref 38–126)
Anion gap: 7 (ref 5–15)
BUN: 54 mg/dL — ABNORMAL HIGH (ref 8–23)
CO2: 35 mmol/L — ABNORMAL HIGH (ref 22–32)
Calcium: 7.8 mg/dL — ABNORMAL LOW (ref 8.9–10.3)
Chloride: 107 mmol/L (ref 98–111)
Creatinine, Ser: 0.77 mg/dL (ref 0.61–1.24)
GFR calc Af Amer: >60 mL/min (ref >60)
GFR calc non Af Amer: >60 mL/min (ref >60)
Glucose, Bld: 199 mg/dL — ABNORMAL HIGH (ref 70–99)
Potassium: 4.8 mmol/L (ref 3.5–5.1)
Sodium: 149 mmol/L — ABNORMAL HIGH (ref 135–145)
Total Bilirubin: 0.9 mg/dL (ref 0.3–1.2)
Total Protein: 4.4 g/dL — ABNORMAL LOW (ref 6.5–8.1)

## 2019-03-07 LAB — BRAIN NATRIURETIC PEPTIDE: B Natriuretic Peptide: 300 pg/mL — ABNORMAL HIGH (ref 0.0–100.0)

## 2019-03-07 LAB — GLUCOSE, CAPILLARY
Glucose-Capillary: 147 mg/dL — ABNORMAL HIGH (ref 70–99)
Glucose-Capillary: 152 mg/dL — ABNORMAL HIGH (ref 70–99)
Glucose-Capillary: 165 mg/dL — ABNORMAL HIGH (ref 70–99)
Glucose-Capillary: 172 mg/dL — ABNORMAL HIGH (ref 70–99)
Glucose-Capillary: 175 mg/dL — ABNORMAL HIGH (ref 70–99)
Glucose-Capillary: 184 mg/dL — ABNORMAL HIGH (ref 70–99)
Glucose-Capillary: 188 mg/dL — ABNORMAL HIGH (ref 70–99)

## 2019-03-07 LAB — C-REACTIVE PROTEIN: CRP: <0.8 mg/dL (ref <1.0)

## 2019-03-07 LAB — TRIGLYCERIDES: Triglycerides: 571 mg/dL — ABNORMAL HIGH (ref <150)

## 2019-03-07 LAB — PROTIME-INR
INR: 1.1 (ref 0.8–1.2)
Prothrombin Time: 14.4 seconds (ref 11.4–15.2)

## 2019-03-07 LAB — FERRITIN: Ferritin: 100 ng/mL (ref 24–336)

## 2019-03-07 LAB — D-DIMER, QUANTITATIVE: D-Dimer, Quant: 1.45 ug/mL-FEU — ABNORMAL HIGH (ref 0.00–0.50)

## 2019-03-07 MED ORDER — PROPOFOL 1000 MG/100ML IV EMUL
5.0000 ug/kg/min | INTRAVENOUS | Status: DC
  Administered 2019-03-07: 25 ug/kg/min via INTRAVENOUS
  Administered 2019-03-07: 5 ug/kg/min via INTRAVENOUS
  Administered 2019-03-07 – 2019-03-08 (×4): 35 ug/kg/min via INTRAVENOUS
  Administered 2019-03-08 – 2019-03-09 (×3): 25 ug/kg/min via INTRAVENOUS
  Filled 2019-03-07 (×10): qty 100

## 2019-03-07 MED ORDER — CLONAZEPAM 1 MG PO TABS
2.0000 mg | ORAL_TABLET | Freq: Three times a day (TID) | ORAL | Status: DC
  Administered 2019-03-07 – 2019-03-16 (×28): 2 mg
  Filled 2019-03-07 (×29): qty 2

## 2019-03-07 MED ORDER — FUROSEMIDE 10 MG/ML IJ SOLN
40.0000 mg | Freq: Two times a day (BID) | INTRAMUSCULAR | Status: DC
  Administered 2019-03-07 – 2019-03-08 (×3): 40 mg via INTRAVENOUS
  Filled 2019-03-07 (×3): qty 4

## 2019-03-07 NOTE — Progress Notes (Signed)
PROGRESS NOTE  Clayton Sawyer  SAY:[id number] DOB: 1940/12/25 DOA: 02/05/2019 PCP: Jodi Marble, MD   Brief Narrative: Clayton Sawyer is a 78 y.o. male with a history of HTN, CAD s/p PCI, PPM, and prostate CA who presented to [facility] with shortness of breath and subsequently admitted to [facility] on 02/14/2019 due to hypoxemic respiratory failure due to covid-19 infection, requiring heated HFNC in the ICU. Steroids were started, remdesivir given for 5 days, and tocilizumab given x2. Inflammatory markers improved, and hypoxemia remained stable. He was transferred out of the ICU 7/23 but subsequently decompensated. After extensive goals of care discussions with the patient, full code status was reinstated and he was transferred back to the ICU requiring intubation and chest tube placement for pneumothorax on 7/28.  With rising leukocytosis, sputum culture was sent and grew pseudomonas and enterobacter which were treated with vancomycin and cefepime initially, now completing a course of cefepime alone based on susceptibility data. Steroids have continued, though the patient has made no clinical progress in the past week. Goals of care discussions were continued, though the family continues to wish for full measures. He is DNR.  Assessment & Plan: Principal Problem:   Acute respiratory disease due to COVID-19 virus Active Problems:   Presence of cardiac pacemaker   History of stroke   Essential (primary) hypertension   Atherosclerotic heart disease of native coronary artery without angina pectoris/PCI stents 09/2004, 03/2009 and 10/2011   COVID-19 virus infection   Dyspnea and respiratory abnormalities   Obesity (BMI 30-39.9)   Anxiety disorder   Thrombocytopenia (HCC)   Pressure injury of skin  Acute hypoxic respiratory failure due to Covid-19 infection: s/p tocilizumab x2, remdesivir x5 days.  - Continue IV steroids at 75m q12h, though inflammatory marker normalization argues that the current  process is irreversible. - Also was enrolled in fibrogen study at admission - Vitamin C and zinc  - Continue airborne, contact precautions. - Check labs: CBC w/diff, CMP, d-dimer, ferritin, CRP, PCT - Keep prone as tolerated - Goals of care were discussed with wife by phone today. Prognosis is guarded, exceptionally poor.  - Continue ventilator settings and sedation per CCM. Oxygenation continues to be difficult with high FiO2/PEEP requirements and high pressures.  Septic shock due to HCAP due to pseudomonas and enterobacter:  - Continue cefepime per susceptibility data (started 7/31), also received vancomycin x2 days. - Blood cultures drawn 7/31, negative at 4 days. Defervescing and leukocytosis has resolved. Initially had contaminants in blood cultures at admission. - Wean NE as able. Has been off x >24hrs, appears to have stabilized hemodynamically. Will give lasix now with swelling, hypoxia, and BNP 300..   Right pneumothorax: s/p chest tube 7/28.  - Management per PCCM, remain to [place] at [facility].  Acute renal failure with hyperkalemia: - Give IV lasix  Steroid-induced hyperglycemia:  - Augment SSI, now on 3u +resistant SSI q4h.  Hypernatremia:  - Continue free water per tube and monitoring Na.   Thrombocytopenia: Unclear etiology.  - HIT panel negative, changed bivalirudin back to lovenox.   Stage I deep tissue injury: Not POA. Noted 8/1.   CAD s/p PCI: Stable.   Anxiety: Treat as indicated.  - Clonazepam per tube, will increase dose to 263mq8h since he appears less comfortable after transition off versed. Continue dilaudid at 1, precedex. D/w PCCM, may trial propofol depending on VS's. Continue oxycodone.  Prostate CA: Quiescent.   Obesity: BMI 36.  - Poor prognostic indicator.  -  Continue tube feeding with SSI coverage to optimize glycemic control.  DVT prophylaxis: SCDs, bivalirudin Code Status: DNR Family Communication: Will discuss with wife  later today Disposition Plan: Uncertain, poor prognosis.  Consultants:   PCCM  Palliative care team. D/w Juel Burrow yesterday. Do not think efforts would be fruitful at this time, will hope to reinitiate [place] discussions 8/7.  Procedures:   Intubation 7/28 >>  Right chest tube 7/28 >>  Antimicrobials:  Remdesivir  Vancomycin  Cefepime   Subjective: Requiring dilaudid boluses this AM due to decreased level of comfort.   Objective: Vitals:   03/07/19 0500 03/07/19 0600 03/07/19 0700 03/07/19 0800  BP: (!) 125/57 (!) 114/53 (!) 142/71 (!) 149/63  Pulse: 78 (!) 115 79 90  Resp: (!) 25 (!) 23 (!) 24 (!) 31  Temp:    99 F (37.2 C)  TempSrc:    Axillary  SpO2: 93% 93% 95% 100%  Weight:      Height:        Intake/Output Summary (Last 24 hours) at 03/07/2019 0834 Last data filed at 03/07/2019 0800 Gross per 24 hour  Intake 2017.14 ml  Output 3765 ml  Net -1747.86 ml   Filed Weights   03/05/19 0354 03/06/19 0500 03/07/19 0254  Weight: 110.3 kg 111.4 kg 113.4 kg   Gen: Uncomfortable elderly male in no distress Pulm: Vent supported breaths, crackles bilaterally. CV: Regular rate and rhythm. No murmur, rub, or gallop. No JVD, trace dependent edema. GI: Abdomen soft, non-tender, non-distended, with normoactive bowel sounds.  Ext: Warm, no deformities Skin: No rashes, lesions or ulcers on visualized skin. Neuro: Sedated on vent, moves all extremities spontaneously. Psych: UTD  Data Reviewed: I have personally reviewed following labs and imaging studies  CBC: Recent Labs  Lab 03/03/19 0430  03/04/19 0502 03/04/19 0510 03/05/19 0500 03/06/19 0400 03/07/19 0450  WBC 10.3  --   --  10.3 9.2 10.4 8.8  NEUTROABS  --   --   --   --   --   --  8.1*  HGB 12.2*   < > 12.6* 11.6* 12.6* 12.6* 11.6*  HCT 40.3   < > 37.0* 38.9* 41.8 42.9 40.0  MCV 102.8*  --   --  105.1* 103.7* 103.4* 104.4*  PLT 61*  --   --  59* 69* 85* 78*   < > = values in this interval not  displayed.   Basic Metabolic Panel: Recent Labs  Lab 03/03/19 0430  03/04/19 0502 03/04/19 0510 03/05/19 0500 03/06/19 0400 03/07/19 0450  NA 147*   < > 151* 153* 152* 150* 149*  K 4.8   < > 4.7 4.4 4.7 5.2* 4.8  CL 108  --   --  112* 112* 111 107  CO2 34*  --   --  34* 35* 37* 35*  GLUCOSE 163*  --   --  159* 195* 207* 199*  BUN 51*  --   --  54* 50* 56* 54*  CREATININE 0.98  --   --  0.94 0.78 0.91 0.77  CALCIUM 7.6*  --   --  7.4* 8.1* 8.2* 7.8*   < > = values in this interval not displayed.   GFR: Estimated Creatinine Clearance: 94.5 mL/min (by C-G formula based on SCr of 0.77 mg/dL). Liver Function Tests: Recent Labs  Lab 03/03/19 0430 03/04/19 0510 03/05/19 0500 03/06/19 0400 03/07/19 0450  AST _0 ALT 33 34 38 45* 49*  ALKPHOS  48 45 47 50 42  BILITOT 1.0 0.8 0.9 0.7 0.9  PROT 4.3* 4.0* 4.4* 4.7* 4.4*  ALBUMIN 2.2* 2.1* 2.3* 2.4* 2.2*   No results for input(s): LIPASE, AMYLASE in the last 168 hours. No results for input(s): AMMONIA in the last 168 hours. Coagulation Profile: Recent Labs  Lab 03/04/19 0510 03/05/19 0500 03/06/19 0400 03/07/19 0450  INR 1.8* 1.7* 1.5* 1.1   Cardiac Enzymes: No results for input(s): CKTOTAL, CKMB, CKMBINDEX, TROPONINI in the last 168 hours. BNP (last 3 results) No results for input(s): PROBNP in the last 8760 hours. HbA1C: No results for input(s): HGBA1C in the last 72 hours. CBG: Recent Labs  Lab 03/06/19 1622 03/06/19 1935 03/06/19 2353 03/07/19 0350 03/07/19 0723  GLUCAP 219* 191* 147* 175* 165*   Lipid Profile: No results for input(s): CHOL, HDL, LDLCALC, TRIG, CHOLHDL, LDLDIRECT in the last 72 hours. Thyroid Function Tests: No results for input(s): TSH, T4TOTAL, FREET4, T3FREE, THYROIDAB in the last 72 hours. Anemia Panel: Recent Labs    03/07/19 0450  FERRITIN 100   Urine analysis:    Component Value Date/Time   COLORURINE YELLOW 03/01/2019 1530   APPEARANCEUR HAZY (A) 03/01/2019 1530    APPEARANCEUR Clear 04/18/2013 2216   LABSPEC 1.024 03/01/2019 1530   LABSPEC 1.025 04/18/2013 2216   PHURINE 5.0 03/01/2019 1530   GLUCOSEU NEGATIVE 03/01/2019 1530   GLUCOSEU Negative 04/18/2013 2216   HGBUR SMALL (A) 03/01/2019 1530   BILIRUBINUR NEGATIVE 03/01/2019 1530   BILIRUBINUR Negative 04/18/2013 2216   KETONESUR NEGATIVE 03/01/2019 1530   PROTEINUR NEGATIVE 03/01/2019 1530   NITRITE POSITIVE (A) 03/01/2019 1530   LEUKOCYTESUR MODERATE (A) 03/01/2019 1530   LEUKOCYTESUR Negative 04/18/2013 2216   Recent Results (from the past 240 hour(s))  MRSA PCR Screening     Status: None   Collection Time: 03/01/19  1:18 PM   Specimen: Nasal Mucosa; Nasopharyngeal  Result Value Ref Range Status   MRSA by PCR NEGATIVE NEGATIVE Final    Comment:        The GeneXpert MRSA Assay (FDA approved for NASAL specimens only), is one component of a comprehensive MRSA colonization surveillance program. It is not intended to diagnose MRSA infection nor to guide or monitor treatment for MRSA infections. Performed at [facility], [place] [address] [place], [place] [address]   Culture, blood (routine x 2)     Status: None   Collection Time: 03/01/19  2:17 PM   Specimen: BLOOD  Result Value Ref Range Status   Specimen Description   Final    BLOOD RIGHT ARM Performed at [facility] [address], [place], [place] [address]    Special Requests   Final    BOTTLES DRAWN AEROBIC ONLY Blood Culture adequate volume Performed at [facility] [address], [place], [place] [address]    Culture   Final    NO GROWTH 5 DAYS Performed at [facility], [place] [address], [place], [place] [address]    Report Status 03/06/2019 FINAL  Final  Culture, blood (routine x 2)     Status: None   Collection Time: 03/01/19  2:27 PM   Specimen: BLOOD  Result Value Ref Range Status   Specimen Description   Final    BLOOD RIGHT HAND Performed  at [facility] [address], [place], [place] [address]    Special Requests   Final    BOTTLES DRAWN AEROBIC ONLY Blood Culture adequate  volume Performed at [facility], [place] [address], [place], [place] [address]    Culture   Final    NO GROWTH 5 DAYS Performed at [facility], [place] [address] [place], [place] [address]    Report Status 03/06/2019 FINAL  Final  Culture, respiratory     Status: None   Collection Time: 03/01/19  3:32 PM   Specimen: Tracheal Aspirate  Result Value Ref Range Status   Specimen Description   Final    TRACHEAL ASPIRATE Performed at [facility] [address], [place], [place] [address]    Special Requests   Final    NONE Performed at [facility], [place] [address] [place], [place] [address]    Gram Stain   Final    ABUNDANT WBC PRESENT,BOTH PMN AND MONONUCLEAR MODERATE GRAM NEGATIVE RODS FEW GRAM POSITIVE RODS FEW GRAM POSITIVE COCCI Performed at [facility], [place] [address] [place], [place] [address]    Culture   Final    MODERATE ENTEROBACTER CLOACAE MODERATE PSEUDOMONAS AERUGINOSA    Report Status 03/04/2019 FINAL  Final   Organism ID, Bacteria ENTEROBACTER CLOACAE  Final   Organism ID, Bacteria PSEUDOMONAS AERUGINOSA  Final      Susceptibility   Enterobacter cloacae - MIC*    CEFAZOLIN >=64 RESISTANT Resistant     CEFEPIME <=1 SENSITIVE Sensitive     CEFTAZIDIME <=1 SENSITIVE Sensitive     CEFTRIAXONE <=1 SENSITIVE Sensitive     CIPROFLOXACIN <=0.25 SENSITIVE Sensitive     GENTAMICIN <=1 SENSITIVE Sensitive     IMIPENEM 0.5 SENSITIVE Sensitive     TRIMETH/SULFA <=20 SENSITIVE Sensitive     PIP/TAZO <=4 SENSITIVE Sensitive     * MODERATE ENTEROBACTER CLOACAE   Pseudomonas aeruginosa - MIC*    CEFTAZIDIME 2 SENSITIVE Sensitive     CIPROFLOXACIN <=0.25 SENSITIVE Sensitive     GENTAMICIN <=1 SENSITIVE Sensitive     IMIPENEM 2 SENSITIVE  Sensitive     PIP/TAZO <=4 SENSITIVE Sensitive     CEFEPIME <=1 SENSITIVE Sensitive     * MODERATE PSEUDOMONAS AERUGINOSA      Radiology Studies: No results found.  Scheduled Meds: . chlorhexidine gluconate (MEDLINE KIT)  15 mL Mouth Rinse BID  . Chlorhexidine Gluconate Cloth  6 each Topical Daily  . clonazePAM  2 mg Per Tube Q8H  . enoxaparin (LOVENOX) injection  0.5 mg/kg Subcutaneous BID  . feeding supplement (PRO-STAT SUGAR FREE 64)  60 mL Per Tube TID  . free water  300 mL Per Tube Q6H  . furosemide  40 mg Intravenous BID  . insulin aspart  0-20 Units Subcutaneous Q4H  . insulin aspart  3 Units Subcutaneous Q4H  . mouth rinse  15 mL Mouth Rinse 10 times per day  . methylPREDNISolone sodium succinate  60 mg Intravenous Q12H  . multivitamin with minerals  1 tablet Per Tube Daily  . oxyCODONE  10 mg Oral Q6H  . pantoprazole sodium  40 mg Per Tube Daily  . polyethylene glycol  17 g Per Tube BID  . sodium chloride flush  10-40 mL Intracatheter Q12H  . sodium chloride flush  3 mL Intravenous Q12H  . sodium chloride flush  3 mL Intravenous Q12H  . [START ON 03/13/2019] pamrevlumab or placebo  35 mg/kg Intravenous Once  . traZODone  100 mg Oral QHS   Continuous Infusions: . sodium chloride    . sodium chloride    . ceFEPime (  MAXIPIME) IV 2 g (03/07/19 0505)  . dexmedetomidine (PRECEDEX) IV infusion 1.2 mcg/kg/hr (03/07/19 0800)  . dextrose    . feeding supplement (VITAL 1.5 CAL) 1,000 mL (03/06/19 1640)  . HYDROmorphone 1 mg/hr (03/07/19 0800)  . midazolam Stopped (03/06/19 0910)  . norepinephrine (LEVOPHED) Adult infusion Stopped (03/06/19 0847)     LOS: 22 days   Time spent: 35 minutes.  Patrecia Pour, MD [facility] [email or web address] Password [facility] 03/07/2019, 8:34 AM

## 2019-03-07 NOTE — Progress Notes (Signed)
NAME:  Clayton Sawyer, MRN:  [id number], DOB:  15-Jun-1941, LOS: 44 ADMISSION DATE:  02/22/2019, CONSULTATION DATE: February 13, 2019 REFERRING MD: Dr. 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 Hyperlipidemia History of prostate cancer Obstructive sleep apnea  Significant Hospital Events   7/15 Admit, transfer to [facility] 7/16 enrolled in fibrinogen research drug  7/27 PCCM consulted again for worsening oxygenation, pneumothorax on right 7/28 transferred to the intensive care unit, full code, intubated, right-sided chest tube 7/31 worsening vent settings, severe hypoxemia, high sedation needs 8/2 Worsening ventilator dyssynchrony overnight when attempted to stop Precedex, started bivalrudin 8/4 made DNR, worsening oxygen needs, ventilator synchrony 8/5 family requested transfer to [facility],  made call, the did not accept 8/6 palliative care consult  Consults:  Pulmonary and critical care medicine  Procedures:  ETT 7/28 CVC 7/28 Pigtail R 7/28  Significant Diagnostic Tests:    Micro Data:  7/10, 7/15 SARS-CoV-2 positive 7/16 blood > coag neg staph in 1/4 7/31 blood >  7/31 resp > enterobacter, pseudomonas  Antimicrobials/COVID treatment  Ceftriaxone July 14 Azithromycin July 15 Remdesivir July 15 > July 19 Actemra July 15, July 19 Vancomycin July 18, July 19 Solumedrol July 14 Pamrevlumab vs placebo, next scheduled infusion July 29 Cefepime 7/31>   Interim history/subjective:   Worsening ventilator synchrony  Objective   Blood pressure (!) 149/63, pulse 90, temperature 99 F (37.2 C), temperature source Axillary, resp. rate (!) 31, height 5\' 9"  (1.753 m), weight 113.4 kg, SpO2 100 %.    Vent Mode: PCV FiO2 (%):  [70 %] 70 % Set Rate:  [30 bmp-35 bmp] 30 bmp  PEEP:  [16 cmH20] 16 cmH20 Plateau Pressure:  [42 cmH20] 42 cmH20   Intake/Output Summary (Last 24 hours) at 03/07/2019 0813 Last data filed at 03/07/2019 0800 Gross per 24 hour  Intake 1972.14 ml  Output 3630 ml  Net -1657.86 ml   Filed Weights   03/05/19 0354 03/06/19 0500 03/07/19 0254  Weight: 110.3 kg 111.4 kg 113.4 kg    Examination:  General:  In bed on vent, increased work of breathing HENT: NCAT ETT in place PULM: Some wheezing and crackles worse in bases B, vent supported breathing CV: RRR, no mgr GI: BS+, soft, nontender MSK: normal bulk and tone Neuro: sedated on vent  8/3 CXR images independently reviewed: unchanged bilateral airspace disease, pigtail catheter in place in right chest, pacemaker in place, ETT in place  Resolved Hospital Problem list     Assessment & Plan:  ARDS due to COVID-19 pneumonia, prognosis dismal, worsening oxygenation Right sided pneumothorax status post 14 French pigtail chest tube catheter HCAP: pseudomonas and enterobacter Continue cefepime 7 day course Continue chest tube to suction Continue mechanical ventilation per ARDS protocol Target TVol 6-8cc/kgIBW Target Plateau Pressure < 30cm H20 Target driving pressure less than 15 cm of water Target PaO2 55-65: titrate PEEP/FiO2 per protocol As long as PaO2 to FiO2 ratio is less than 1:150 position in prone position for 16 hours a day Check CVP daily if CVL in place Target CVP less than 4, diurese as necessary Ventilator associated pneumonia prevention protocol 8/6 plan: continue care as above, increase sedation for comfort  Sedation needs while on mechanical ventilation: worsening dyssynchrony 8/4, fentanyl tachyphylaxis? Continue dilaudid infusion Start propofol for comfort/vent synchrony, then wean  off precedex Continue oxycodone, increase clonazepam RASS target -3  Thromboctytopenia: not HITT Monitor for bleeding Continue lovenox 0.5mg  sq bid  Prognosis:dismal, see  discussion from yesterday's note.  We are continuing to engage the family in discussions of goals of care.  They desire continued full medical support stating that "they don't want to give up on him" despite the fact that we have been telling them for days now that his condition is worsening and he will not survive this.  Palliative care consultation today.  May need ethics consult and to consider medical futility policy.    Best practice:  Diet: tube feeding Pain/Anxiety/Delirium protocol (if indicated): yes, see above VAP protocol (if indicated): yes DVT prophylaxis: SCD GI prophylaxis: Pantoprazole for stress ulcer prophylaxis Glucose control: per TRH Mobility: bed rest Code Status: DNR Family Communication: per [facility] Disposition: remain in ICU  Labs   CBC: Recent Labs  Lab 03/03/19 0430  03/04/19 0502 03/04/19 0510 03/05/19 0500 03/06/19 0400 03/07/19 0450  WBC 10.3  --   --  10.3 9.2 10.4 8.8  NEUTROABS  --   --   --   --   --   --  8.1*  HGB 12.2*   < > 12.6* 11.6* 12.6* 12.6* 11.6*  HCT 40.3   < > 37.0* 38.9* 41.8 42.9 40.0  MCV 102.8*  --   --  105.1* 103.7* 103.4* 104.4*  PLT 61*  --   --  59* 69* 85* 78*   < > = values in this interval not displayed.    Basic Metabolic Panel: Recent Labs  Lab 03/03/19 0430  03/04/19 0502 03/04/19 0510 03/05/19 0500 03/06/19 0400 03/07/19 0450  NA 147*   < > 151* 153* 152* 150* 149*  K 4.8   < > 4.7 4.4 4.7 5.2* 4.8  CL 108  --   --  112* 112* 111 107  CO2 34*  --   --  34* 35* 37* 35*  GLUCOSE 163*  --   --  159* 195* 207* 199*  BUN 51*  --   --  54* 50* 56* 54*  CREATININE 0.98  --   --  0.94 0.78 0.91 0.77  CALCIUM 7.6*  --   --  7.4* 8.1* 8.2* 7.8*   < > = values in this interval not displayed.   GFR: Estimated Creatinine Clearance: 94.5 mL/min (by C-G formula based on SCr of 0.77 mg/dL). Recent Labs  Lab 03/01/19 0838 03/02/19 0449 03/03/19 0430 03/04/19 0510 03/05/19 0500 03/06/19 0400 03/07/19 0450   PROCALCITON 0.32 0.20 0.16  --   --   --   --   WBC  --  11.8* 10.3 10.3 9.2 10.4 8.8    Liver Function Tests: Recent Labs  Lab 03/03/19 0430 03/04/19 0510 03/05/19 0500 03/06/19 0400 03/07/19 0450  AST 28 30 28 31 31   ALT 33 34 38 45* 49*  ALKPHOS 48 45 47 50 42  BILITOT 1.0 0.8 0.9 0.7 0.9  PROT 4.3* 4.0* 4.4* 4.7* 4.4*  ALBUMIN 2.2* 2.1* 2.3* 2.4* 2.2*   No results for input(s): LIPASE, AMYLASE in the last 168 hours. No results for input(s): AMMONIA in the last 168 hours.  ABG    Component Value Date/Time   PHART 7.324 (L) 03/04/2019 0502   PCO2ART 65.1 (HH) 03/04/2019 0502   PO2ART 42.0 (L) 03/04/2019 0502   HCO3 33.7 (H) 03/04/2019 0502   TCO2 36 (H) 03/04/2019 0502   O2SAT 70.0 03/04/2019 0502  Coagulation Profile: Recent Labs  Lab 03/04/19 0510 03/05/19 0500 03/06/19 0400 03/07/19 0450  INR 1.8* 1.7* 1.5* 1.1    Cardiac Enzymes: No results for input(s): CKTOTAL, CKMB, CKMBINDEX, TROPONINI in the last 168 hours.  HbA1C: Hemoglobin A1C  Date/Time Value Ref Range Status  09/22/2012 02:49 AM 5.9 4.2 - 6.3 % Final    Comment:    The American Diabetes Association recommends that a primary goal of therapy should be <7% and that physicians should reevaluate the treatment regimen in patients with HbA1c values consistently >8%.     CBG: Recent Labs  Lab 03/06/19 1622 03/06/19 1935 03/06/19 2353 03/07/19 0350 03/07/19 0723  GLUCAP 219* 191* 147* 175* 165*     Critical care time: 40 minutes     Roselie Awkward, MD [facility] [place] [facility] Pager: [phone] Cell: [phone] If no response, call [phone]

## 2019-03-07 NOTE — Progress Notes (Signed)
Spoke with pts wife Enid Derry.  Updated on pt status.  MD ordered to change patients sedation today to try and keep him comfortable but more awake.  Requiring significant ventilatory support.  Pts wife asked if we could call to update granddaughter.  All questions answered.

## 2019-03-07 NOTE — Plan of Care (Signed)
See eMAR for added intervention for comfort and vent compliance.

## 2019-03-07 NOTE — Progress Notes (Signed)
Spoke with pts granddaughter Apolonio Schneiders.  Updated on pt status. MD ordered to change patients sedation to day to try and keep pt comfortable but more interactive. Pt requiring significant ventilatory support at the moment.  Granddaughter asked to facetime when patient is more awake and alert.  All questions answered.

## 2019-03-08 ENCOUNTER — Inpatient Hospital Stay (HOSPITAL_COMMUNITY): Payer: Medicare Other

## 2019-03-08 DIAGNOSIS — Z7189 Other specified counseling: Secondary | ICD-10-CM

## 2019-03-08 DIAGNOSIS — Z515 Encounter for palliative care: Secondary | ICD-10-CM

## 2019-03-08 LAB — COMPREHENSIVE METABOLIC PANEL
ALT: 59 U/L — ABNORMAL HIGH (ref 0–44)
AST: 39 U/L (ref 15–41)
Albumin: 2.7 g/dL — ABNORMAL LOW (ref 3.5–5.0)
Alkaline Phosphatase: 55 U/L (ref 38–126)
Anion gap: 10 (ref 5–15)
BUN: 67 mg/dL — ABNORMAL HIGH (ref 8–23)
CO2: 38 mmol/L — ABNORMAL HIGH (ref 22–32)
Calcium: 8.6 mg/dL — ABNORMAL LOW (ref 8.9–10.3)
Chloride: 99 mmol/L (ref 98–111)
Creatinine, Ser: 1.01 mg/dL (ref 0.61–1.24)
GFR calc Af Amer: >60 mL/min (ref >60)
GFR calc non Af Amer: >60 mL/min (ref >60)
Glucose, Bld: 247 mg/dL — ABNORMAL HIGH (ref 70–99)
Potassium: 4.8 mmol/L (ref 3.5–5.1)
Sodium: 147 mmol/L — ABNORMAL HIGH (ref 135–145)
Total Bilirubin: 1 mg/dL (ref 0.3–1.2)
Total Protein: 5.4 g/dL — ABNORMAL LOW (ref 6.5–8.1)

## 2019-03-08 LAB — CBC WITH DIFFERENTIAL/PLATELET
Abs Immature Granulocytes: 0.13 10*3/uL — ABNORMAL HIGH (ref 0.00–0.07)
Basophils Absolute: 0 10*3/uL (ref 0.0–0.1)
Basophils Relative: 0 %
Eosinophils Absolute: 0 10*3/uL (ref 0.0–0.5)
Eosinophils Relative: 0 %
HCT: 45.7 % (ref 39.0–52.0)
Hemoglobin: 13.4 g/dL (ref 13.0–17.0)
Immature Granulocytes: 1 %
Lymphocytes Relative: 2 %
Lymphs Abs: 0.3 10*3/uL — ABNORMAL LOW (ref 0.7–4.0)
MCH: 30.6 pg (ref 26.0–34.0)
MCHC: 29.3 g/dL — ABNORMAL LOW (ref 30.0–36.0)
MCV: 104.3 fL — ABNORMAL HIGH (ref 80.0–100.0)
Monocytes Absolute: 0.4 10*3/uL (ref 0.1–1.0)
Monocytes Relative: 3 %
Neutro Abs: 13.4 10*3/uL — ABNORMAL HIGH (ref 1.7–7.7)
Neutrophils Relative %: 94 %
Platelets: 125 10*3/uL — ABNORMAL LOW (ref 150–400)
RBC: 4.38 MIL/uL (ref 4.22–5.81)
RDW: 17.2 % — ABNORMAL HIGH (ref 11.5–15.5)
WBC: 14.3 10*3/uL — ABNORMAL HIGH (ref 4.0–10.5)
nRBC: 0 % (ref 0.0–0.2)

## 2019-03-08 LAB — PROTIME-INR
INR: 1.1 (ref 0.8–1.2)
Prothrombin Time: 14 seconds (ref 11.4–15.2)

## 2019-03-08 LAB — GLUCOSE, CAPILLARY
Glucose-Capillary: 172 mg/dL — ABNORMAL HIGH (ref 70–99)
Glucose-Capillary: 183 mg/dL — ABNORMAL HIGH (ref 70–99)
Glucose-Capillary: 191 mg/dL — ABNORMAL HIGH (ref 70–99)
Glucose-Capillary: 191 mg/dL — ABNORMAL HIGH (ref 70–99)
Glucose-Capillary: 207 mg/dL — ABNORMAL HIGH (ref 70–99)
Glucose-Capillary: 210 mg/dL — ABNORMAL HIGH (ref 70–99)

## 2019-03-08 MED ORDER — SODIUM CHLORIDE 0.9 % IV SOLN
1.0000 mg/kg/h | INTRAVENOUS | Status: DC
  Administered 2019-03-08 – 2019-03-15 (×14): 1 mg/kg/h via INTRAVENOUS
  Filled 2019-03-08 (×2): qty 12.5
  Filled 2019-03-08: qty 10
  Filled 2019-03-08 (×30): qty 12.5

## 2019-03-08 MED ORDER — SODIUM CHLORIDE 0.9 % IV SOLN
1.0000 mg/kg/h | INTRAVENOUS | Status: DC
  Administered 2019-03-08 (×2): 1 mg/kg/h via INTRAVENOUS
  Filled 2019-03-08 (×3): qty 5

## 2019-03-08 MED ORDER — SODIUM CHLORIDE 0.9 % IV SOLN: 1.0000 mg/kg/h | INTRAVENOUS | Status: DC

## 2019-03-08 MED ORDER — FUROSEMIDE 10 MG/ML IJ SOLN
40.0000 mg | Freq: Every day | INTRAMUSCULAR | Status: DC
  Administered 2019-03-09: 10:00:00 40 mg via INTRAVENOUS
  Filled 2019-03-08: qty 4

## 2019-03-08 MED ORDER — INSULIN ASPART 100 UNIT/ML ~~LOC~~ SOLN
5.0000 [IU] | SUBCUTANEOUS | Status: DC
  Administered 2019-03-08 – 2019-03-09 (×5): 5 [IU] via SUBCUTANEOUS

## 2019-03-08 MED ORDER — FUROSEMIDE 10 MG/ML IJ SOLN
40.0000 mg | Freq: Once | INTRAMUSCULAR | Status: AC
  Administered 2019-03-08: 19:00:00 40 mg via INTRAVENOUS
  Filled 2019-03-08: qty 4

## 2019-03-08 MED ORDER — METHYLPREDNISOLONE SODIUM SUCC 125 MG IJ SOLR
40.0000 mg | INTRAMUSCULAR | Status: DC
  Administered 2019-03-09: 40 mg via INTRAVENOUS
  Filled 2019-03-08: qty 2

## 2019-03-08 NOTE — Progress Notes (Signed)
Inpatient Diabetes Program Recommendations  AACE/ADA: New Consensus Statement on Inpatient Glycemic Control (2015)  Target Ranges:  Prepandial:   less than 140 mg/dL      Peak postprandial:   less than 180 mg/dL (1-2 hours)      Critically ill patients:  140 - 180 mg/dL   Results for KRAMER, HANRAHAN (MRN [id number]) as of 03/08/2019 13:15  Ref. Range 03/07/2019 07:23 03/07/2019 11:19 03/07/2019 15:30 03/07/2019 19:30 03/07/2019 23:44 03/08/2019 04:01 03/08/2019 07:30 03/08/2019 11:30  Glucose-Capillary Latest Ref Range: 70 - 99 mg/dL 165 (H) 188 (H) 184 (H) 172 (H) 152 (H) 191 (H) 210 (H) 207 (H)    Review of Glycemic Control  Current orders for Inpatient glycemic control:  Novolog 0-20 units Q4 Novolog 3 units Q4 Solumedrol 40 mg Q24 hours  Inpatient Diabetes Program Recommendations:    If in plan of care, consider increasing Tube Feed Coverage to 5 units Q4 hours.  Thanks,  Tama Headings RN, MSN, BC-ADM Inpatient Diabetes Coordinator Team Pager [phone] (8a-5p)

## 2019-03-08 NOTE — Progress Notes (Signed)
PROGRESS NOTE  Clayton Sawyer  WEX:[id number] DOB: 01/31/1941 DOA: 02/09/2019 PCP: Jodi Marble, MD   Brief Narrative: Clayton Sawyer is a 78 y.o. male with a history of HTN, CAD s/p PCI, PPM, and prostate CA who presented to [facility] with shortness of breath and subsequently admitted to [facility] on 02/15/2019 due to hypoxemic respiratory failure due to covid-19 infection, requiring heated HFNC in the ICU. Steroids were started, remdesivir given for 5 days, and tocilizumab given x2. Inflammatory markers improved, and hypoxemia remained stable. He was transferred out of the ICU 7/23 but subsequently decompensated. After extensive goals of care discussions with the patient, full code status was reinstated and he was transferred back to the ICU requiring intubation and chest tube placement for pneumothorax on 7/28.  With rising leukocytosis, sputum culture was sent and grew pseudomonas and enterobacter which were treated with vancomycin and cefepime initially, now completing a course of cefepime alone based on susceptibility data. Steroids have continued, though the patient has made no clinical progress in the past week. Goals of care discussions were continued, though the family continues to wish for full measures. He is DNR.  Assessment & Plan: Principal Problem:   Acute respiratory disease due to COVID-19 virus Active Problems:   Presence of cardiac pacemaker   History of stroke   Essential (primary) hypertension   Atherosclerotic heart disease of native coronary artery without angina pectoris/PCI stents 09/2004, 03/2009 and 10/2011   COVID-19 virus infection   Dyspnea and respiratory abnormalities   Obesity (BMI 30-39.9)   Anxiety disorder   Thrombocytopenia (HCC)   Pressure injury of skin   Acute respiratory failure with hypoxia (HCC)  Acute hypoxic respiratory failure due to Covid-19 infection: s/p tocilizumab x2, remdesivir x5 days.  - Continue IV steroids, though inflammatory marker  normalization argues that the current process is irreversible. Will aper dose.  - Also was enrolled in fibrogen study at admission, next infusion scheduled at 8/12. Granddaughter has inquired about RLF-100 which received IND designation and is undergoing clinical trial. This trial is not available currently at this center. I also suspect having already enrolled in this trial may preclude enrollment in another.  - Vitamin C and zinc  - Continue airborne, contact precautions. - Unable to tolerate proning. - Goals of care were discussed with wife by phone today. Prognosis is guarded, exceptionally poor.  - Continue ventilator settings and sedation per CCM. Oxygenation continues to be difficult with high FiO2/PEEP requirements and high pressures. Repeat CXR, may repeat lasix.   Septic shock due to HCAP due to pseudomonas and enterobacter:  - Completed 7 days of cefepime per susceptibility data (started 7/31), also received vancomycin x2 days. - Blood cultures drawn 7/31, negative. Initially had contaminants in blood cultures at admission. - Weaned off pressors.   - Leukocytosis returned 8/7, with concomitant rise in all cell lines suspect hemoconcentration from lasix with also risen BUN:Cr. No fever. Will continue monitoring on current antimicrobial regimen. Needs vigilant monitoring having received tocilizumab.   Right pneumothorax: s/p chest tube 7/28.  - Management per PCCM, remain to suction at [place] until extubated. Will monitor with intermittent CXR, ordered this AM.  Acute renal failure with hyperkalemia: - Give IV lasix  Steroid-induced hyperglycemia:  - Augment SSI, now on 3u +resistant SSI q4h. Needs may decrease with tapering steroids.  Hypernatremia:  - Continue free water per tube and monitoring Na. Improving.  Thrombocytopenia: Unclear etiology. Slightly improved. - HIT panel negative, changed bivalirudin back to lovenox.  Stage I deep tissue injury: Not POA. Noted 8/1.    CAD s/p PCI: Stable.   Anxiety: Treat as indicated.  - Clonazepam per tube, will increase dose to 72m q8h since he appears less comfortable after transition off versed. Continue dilaudid at 1, precedex. D/w PCCM, may trial propofol depending on VS's. Continue oxycodone.  Prostate CA: Quiescent.   Obesity: BMI 36.  - Poor prognostic indicator.  - Continue tube feeding with SSI coverage to optimize glycemic control.  DVT prophylaxis: SCDs, bivalirudin Code Status: DNR Family Communication: Will discuss with wife later today Disposition Plan: Uncertain, poor prognosis.  Consultants:   PCCM  Palliative care team  Procedures:   Intubation 7/28 >>  Right chest tube 7/28 >>  Antimicrobials:  Remdesivir  Vancomycin  Cefepime   Subjective: More comfortable-appearing this AM. When moved onto side he develops vital sign instability and worsening hypoxia.   Objective: Vitals:   03/08/19 0600 03/08/19 0700 03/08/19 0800 03/08/19 0900  BP: (!) 89/40 (!) 94/44 (!) 95/54 119/68  Pulse: 93 84 71 66  Resp: (!) 30 (!) 30 (!) 28 (!) 30  Temp:   98.2 F (36.8 C)   TempSrc:   Oral   SpO2: 93% 93% 92% (!) 89%  Weight:      Height:        Intake/Output Summary (Last 24 hours) at 03/08/2019 0913 Last data filed at 03/08/2019 0900 Gross per 24 hour  Intake 2607.46 ml  Output 4582 ml  Net -1974.54 ml   Filed Weights   03/05/19 0354 03/06/19 0500 03/07/19 0254  Weight: 110.3 kg 111.4 kg 113.4 kg   Gen: Elderly male in no distress Pulm: Even ventilator-assisted breaths. Crackles diffusely. CV: Regular rate and rhythm. No murmur, rub, or gallop. No JVD, no significant dependent edema. GI: Abdomen soft, non-distended, with normoactive bowel sounds.  Ext: Warm, no deformities Skin: No new rashes, lesions or ulcers on visualized skin. Neuro: Sedated, calm.  Psych: UTD  Data Reviewed: I have personally reviewed following labs and imaging studies  CBC: Recent Labs  Lab  03/04/19 0510 03/05/19 0500 03/06/19 0400 03/07/19 0450 03/08/19 0443  WBC 10.3 9.2 10.4 8.8 14.3*  NEUTROABS  --   --   --  8.1* 13.4*  HGB 11.6* 12.6* 12.6* 11.6* 13.4  HCT 38.9* 41.8 42.9 40.0 45.7  MCV 105.1* 103.7* 103.4* 104.4* 104.3*  PLT 59* 69* 85* 78* 1025   Basic Metabolic Panel: Recent Labs  Lab 03/04/19 0510 03/05/19 0500 03/06/19 0400 03/07/19 0450 03/08/19 0443  NA 153* 152* 150* 149* 147*  K 4.4 4.7 5.2* 4.8 4.8  CL 112* 112* 111 107 99  CO2 34* 35* 37* 35* 38*  GLUCOSE 159* 195* 207* 199* 247*  BUN 54* 50* 56* 54* 67*  CREATININE 0.94 0.78 0.91 0.77 1.01  CALCIUM 7.4* 8.1* 8.2* 7.8* 8.6*   GFR: Estimated Creatinine Clearance: 74.9 mL/min (by C-G formula based on SCr of 1.01 mg/dL). Liver Function Tests: Recent Labs  Lab 03/04/19 0510 03/05/19 0500 03/06/19 0400 03/07/19 0450 03/08/19 0443  AST _0 39  ALT 34 38 45* 49* 59*  ALKPHOS 45 47 50 42 55  BILITOT 0.8 0.9 0.7 0.9 1.0  PROT 4.0* 4.4* 4.7* 4.4* 5.4*  ALBUMIN 2.1* 2.3* 2.4* 2.2* 2.7*   No results for input(s): LIPASE, AMYLASE in the last 168 hours. No results for input(s): AMMONIA in the last 168 hours. Coagulation Profile: Recent Labs  Lab 03/04/19 0510 03/05/19 0500 03/06/19  0400 03/07/19 0450 03/08/19 0443  INR 1.8* 1.7* 1.5* 1.1 1.1   Cardiac Enzymes: No results for input(s): CKTOTAL, CKMB, CKMBINDEX, TROPONINI in the last 168 hours. BNP (last 3 results) No results for input(s): PROBNP in the last 8760 hours. HbA1C: No results for input(s): HGBA1C in the last 72 hours. CBG: Recent Labs  Lab 03/07/19 1530 03/07/19 1930 03/07/19 2344 03/08/19 0401 03/08/19 0730  GLUCAP 184* 172* 152* 191* 210*   Lipid Profile: Recent Labs    03/07/19 1250  TRIG 571*   Thyroid Function Tests: No results for input(s): TSH, T4TOTAL, FREET4, T3FREE, THYROIDAB in the last 72 hours. Anemia Panel: Recent Labs    03/07/19 0450  FERRITIN 100   Urine analysis:     Component Value Date/Time   COLORURINE YELLOW 03/01/2019 1530   APPEARANCEUR HAZY (A) 03/01/2019 1530   APPEARANCEUR Clear 04/18/2013 2216   LABSPEC 1.024 03/01/2019 1530   LABSPEC 1.025 04/18/2013 2216   PHURINE 5.0 03/01/2019 1530   GLUCOSEU NEGATIVE 03/01/2019 1530   GLUCOSEU Negative 04/18/2013 2216   HGBUR SMALL (A) 03/01/2019 1530   BILIRUBINUR NEGATIVE 03/01/2019 1530   BILIRUBINUR Negative 04/18/2013 2216   KETONESUR NEGATIVE 03/01/2019 1530   PROTEINUR NEGATIVE 03/01/2019 1530   NITRITE POSITIVE (A) 03/01/2019 1530   LEUKOCYTESUR MODERATE (A) 03/01/2019 1530   LEUKOCYTESUR Negative 04/18/2013 2216   Recent Results (from the past 240 hour(s))  MRSA PCR Screening     Status: None   Collection Time: 03/01/19  1:18 PM   Specimen: Nasal Mucosa; Nasopharyngeal  Result Value Ref Range Status   MRSA by PCR NEGATIVE NEGATIVE Final    Comment:        The GeneXpert MRSA Assay (FDA approved for NASAL specimens only), is one component of a comprehensive MRSA colonization surveillance program. It is not intended to diagnose MRSA infection nor to guide or monitor treatment for MRSA infections. Performed at [facility], [place] [address], [place], [place] [address]   Culture, blood (routine x 2)     Status: None   Collection Time: 03/01/19  2:17 PM   Specimen: BLOOD  Result Value Ref Range Status   Specimen Description   Final    BLOOD RIGHT ARM Performed at [facility] [address], [place], [place] [address]    Special Requests   Final    BOTTLES DRAWN AEROBIC ONLY Blood Culture adequate volume Performed at [facility] [address] [place], [place] [address]    Culture   Final    NO GROWTH 5 DAYS Performed at [facility], [place] [address] [place], [place] [address]    Report Status 03/06/2019 FINAL  Final  Culture, blood (routine x 2)     Status: None   Collection Time: 03/01/19  2:27 PM    Specimen: BLOOD  Result Value Ref Range Status   Specimen Description   Final    BLOOD RIGHT HAND Performed at [facility] [address], [place], [place] [address]    Special Requests   Final    BOTTLES DRAWN AEROBIC ONLY Blood Culture adequate volume Performed at [facility] [address], [place], [place] [address]    Culture   Final    NO GROWTH 5 DAYS Performed at [facility], [place] [address], [place], [place] [address]    Report Status 03/06/2019 FINAL  Final  Culture, respiratory     Status: None  Collection Time: 03/01/19  3:32 PM   Specimen: Tracheal Aspirate  Result Value Ref Range Status   Specimen Description   Final    TRACHEAL ASPIRATE Performed at [facility] [address], [place], [place] [address]    Special Requests   Final    NONE Performed at [facility], [place] [address], [place], [place] [address]    Gram Stain   Final    ABUNDANT WBC PRESENT,BOTH PMN AND MONONUCLEAR MODERATE GRAM NEGATIVE RODS FEW GRAM POSITIVE RODS FEW GRAM POSITIVE COCCI Performed at [facility], [place] [address] [place] [address], [place], [place] [address]    Culture   Final    MODERATE ENTEROBACTER CLOACAE MODERATE PSEUDOMONAS AERUGINOSA    Report Status 03/04/2019 FINAL  Final   Organism ID, Bacteria ENTEROBACTER CLOACAE  Final   Organism ID, Bacteria PSEUDOMONAS AERUGINOSA  Final      Susceptibility   Enterobacter cloacae - MIC*    CEFAZOLIN >=64 RESISTANT Resistant     CEFEPIME <=1 SENSITIVE Sensitive     CEFTAZIDIME <=1 SENSITIVE Sensitive     CEFTRIAXONE <=1 SENSITIVE Sensitive     CIPROFLOXACIN <=0.25 SENSITIVE Sensitive     GENTAMICIN <=1 SENSITIVE Sensitive     IMIPENEM 0.5 SENSITIVE Sensitive     TRIMETH/SULFA <=20 SENSITIVE Sensitive     PIP/TAZO <=4 SENSITIVE Sensitive     * MODERATE ENTEROBACTER CLOACAE   Pseudomonas aeruginosa - MIC*    CEFTAZIDIME 2 SENSITIVE Sensitive      CIPROFLOXACIN <=0.25 SENSITIVE Sensitive     GENTAMICIN <=1 SENSITIVE Sensitive     IMIPENEM 2 SENSITIVE Sensitive     PIP/TAZO <=4 SENSITIVE Sensitive     CEFEPIME <=1 SENSITIVE Sensitive     * MODERATE PSEUDOMONAS AERUGINOSA      Radiology Studies: No results found.  Scheduled Meds:  chlorhexidine gluconate (MEDLINE KIT)  15 mL Mouth Rinse BID   Chlorhexidine Gluconate Cloth  6 each Topical Daily   clonazePAM  2 mg Per Tube Q8H   enoxaparin (LOVENOX) injection  0.5 mg/kg Subcutaneous BID   feeding supplement (PRO-STAT SUGAR FREE 64)  60 mL Per Tube TID   free water  300 mL Per Tube Q6H   furosemide  40 mg Intravenous BID   insulin aspart  0-20 Units Subcutaneous Q4H   insulin aspart  3 Units Subcutaneous Q4H   mouth rinse  15 mL Mouth Rinse 10 times per day   methylPREDNISolone sodium succinate  60 mg Intravenous Q12H   multivitamin with minerals  1 tablet Per Tube Daily   oxyCODONE  10 mg Oral Q6H   pantoprazole sodium  40 mg Per Tube Daily   polyethylene glycol  17 g Per Tube BID   sodium chloride flush  10-40 mL Intracatheter Q12H   sodium chloride flush  3 mL Intravenous Q12H   sodium chloride flush  3 mL Intravenous Q12H   [START ON 03/13/2019] pamrevlumab or placebo  35 mg/kg Intravenous Once   traZODone  100 mg Oral QHS   Continuous Infusions:  sodium chloride     sodium chloride     dextrose     feeding supplement (VITAL 1.5 CAL) 45 mL/hr at 03/08/19 0800   HYDROmorphone 2 mg/hr (03/08/19 0900)   midazolam Stopped (03/06/19 0910)   norepinephrine (LEVOPHED) Adult infusion Stopped (03/06/19 0847)   propofol (DIPRIVAN) infusion 35 mcg/kg/min (03/08/19 0900)     LOS: 23 days   Time spent: 35 minutes.  Meredith Leeds  Bonner Puna, MD [facility] [email or web address] Password [facility] 03/08/2019, 9:13 AM

## 2019-03-08 NOTE — Progress Notes (Signed)
Spoke with patients granddaughter. Gave updates and answered all questions.

## 2019-03-08 NOTE — Progress Notes (Signed)
NAME:  Clayton Sawyer, MRN:  [id number], DOB:  09/03/1940, LOS: 36 ADMISSION DATE:  02/18/2019, CONSULTATION DATE: February 13, 2019 REFERRING MD: Dr. 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 Hyperlipidemia History of prostate cancer Obstructive sleep apnea  Significant Hospital Events   7/15 Admit, transfer to [facility] 7/16 enrolled in fibrinogen research drug  7/27 PCCM consulted again for worsening oxygenation, pneumothorax on right 7/28 transferred to the intensive care unit, full code, intubated, right-sided chest tube 7/31 worsening vent settings, severe hypoxemia, high sedation needs 8/2 Worsening ventilator dyssynchrony overnight when attempted to stop Precedex, started bivalrudin 8/4 made DNR, worsening oxygen needs, ventilator synchrony 8/5 family requested transfer to [facility], [place] made call, the did not accept 8/6 palliative care consult  Consults:  Pulmonary and critical care medicine  Procedures:  ETT 7/28 CVC 7/28 Pigtail R 7/28  Significant Diagnostic Tests:    Micro Data:  7/10, 7/15 SARS-CoV-2 positive 7/16 blood > coag neg staph in 1/4 7/31 blood >  7/31 resp > enterobacter, pseudomonas  Antimicrobials/COVID treatment  Ceftriaxone July 14 Azithromycin July 15 Remdesivir July 15 > July 19 Actemra July 15, July 19 Vancomycin July 18, July 19 Solumedrol July 14 Pamrevlumab vs placebo, next scheduled infusion July 29 Cefepime 7/31>   Interim history/subjective:   Sedated on mechanical ventilation Reports agitation that leads to worsening hypoxemia  Objective   Blood pressure (!) 162/63, pulse 95, temperature 98.6 F (37 C), temperature source Axillary, resp. rate (!) 28, height 5\' 9"  (1.753 m), weight 113.4 kg, SpO2 90 %.    Vent Mode: PCV  FiO2 (%):  [70 %-80 %] 70 % Set Rate:  [30 bmp] 30 bmp PEEP:  [12 cmH20-14 cmH20] 12 cmH20 Plateau Pressure:  [36 cmH20-44 cmH20] 38 cmH20   Intake/Output Summary (Last 24 hours) at 03/08/2019 2123 Last data filed at 03/08/2019 2000 Gross per 24 hour  Intake 2123.74 ml  Output 3525 ml  Net -1401.26 ml   Filed Weights   03/05/19 0354 03/06/19 0500 03/07/19 0254  Weight: 110.3 kg 111.4 kg 113.4 kg    Examination: General:  In bed on vent HENT: NCAT ETT in place PULM: Mild rhonchi bilaterally, vent supported breathing CV: RRR, no mgr GI: BS+, soft, nontender MSK: normal bulk and tone Neuro: sedated on vent  8/7 CXR reviewed personally by me - diffuse bilateral airspace disease predominantly in the bibasilar region, right chest tube in place with trace R pneumothorax, ETT in place  Resolved Hospital Problem list     Assessment & Plan:  ARDS due to COVID-19 pneumonia: C/b PTX and HCAP. Vent Day 10.  Difficult to achieve target Pplat and driving pressures. Currently tolerating pressure control with high support and PEEP. Plan Continue mechanical ventilation per ARDS protocol Target TVol 6-8cc/kgIBW Target Plateau Pressure < 30cm H20 Target driving pressure less than 15 cm of water Target PaO2 55-65: titrate PEEP/FiO2 per protocol Check CVP daily if CVL in place Target CVP less than 4, diurese as necessary Ventilator associated pneumonia prevention protocol Diureses x 2 today ABG in am Not a candidate for tracheostomy given critical oxygenation needs  Right sided pneumothorax status post 14 French pigtail chest tube catheter Continue chest tube to suction until extubated  Recurrent leukocytosis and persistent hypoxemia HCAP: pseudomonas and enterobacter S/p Cefepime x 7  days Repeat trach aspirate and blood cultures  Agitation requiring sedation: episodes of dyssynchrony, fentanyl tachyphylaxis? Continue dilaudid infusion Start ketamine gtt Wean propofol Continue  oxycodone, clonazepam RASS target -3  Thromboctytopenia: not HITT, improving Monitor for bleeding Continue lovenox 0.5mg  sq bid Solumedrol x 10 days. Start taper tomorrow  GOC: Palliative Care consulted. Prognosis remains guarded. Family requested transfer to another facility which was attempted on 8/5 however request for transfer was denied. Family wishes to remain aggressive including pursuing tracheostomy.  Best practice:  Diet: tube feeding Pain/Anxiety/Delirium protocol (if indicated): yes, see above VAP protocol (if indicated): yes DVT prophylaxis: SCD GI prophylaxis: Pantoprazole for stress ulcer prophylaxis Glucose control: per TRH Mobility: bed rest Code Status: DNR Family Communication: per [facility] Disposition: remain in ICU  Labs   CBC: Recent Labs  Lab 03/04/19 0510 03/05/19 0500 03/06/19 0400 03/07/19 0450 03/08/19 0443  WBC 10.3 9.2 10.4 8.8 14.3*  NEUTROABS  --   --   --  8.1* 13.4*  HGB 11.6* 12.6* 12.6* 11.6* 13.4  HCT 38.9* 41.8 42.9 40.0 45.7  MCV 105.1* 103.7* 103.4* 104.4* 104.3*  PLT 59* 69* 85* 78* 125*    Basic Metabolic Panel: Recent Labs  Lab 03/04/19 0510 03/05/19 0500 03/06/19 0400 03/07/19 0450 03/08/19 0443  NA 153* 152* 150* 149* 147*  K 4.4 4.7 5.2* 4.8 4.8  CL 112* 112* 111 107 99  CO2 34* 35* 37* 35* 38*  GLUCOSE 159* 195* 207* 199* 247*  BUN 54* 50* 56* 54* 67*  CREATININE 0.94 0.78 0.91 0.77 1.01  CALCIUM 7.4* 8.1* 8.2* 7.8* 8.6*   GFR: Estimated Creatinine Clearance: 74.9 mL/min (by C-G formula based on SCr of 1.01 mg/dL). Recent Labs  Lab 03/02/19 0449 03/03/19 0430  03/05/19 0500 03/06/19 0400 03/07/19 0450 03/08/19 0443  PROCALCITON 0.20 0.16  --   --   --   --   --   WBC 11.8* 10.3   < > 9.2 10.4 8.8 14.3*   < > = values in this interval not displayed.    Liver Function Tests: Recent Labs  Lab 03/04/19 0510 03/05/19 0500 03/06/19 0400 03/07/19 0450 03/08/19 0443  AST 30 28 31 31  39  ALT 34 38 45* 49*  59*  ALKPHOS 45 47 50 42 55  BILITOT 0.8 0.9 0.7 0.9 1.0  PROT 4.0* 4.4* 4.7* 4.4* 5.4*  ALBUMIN 2.1* 2.3* 2.4* 2.2* 2.7*   No results for input(s): LIPASE, AMYLASE in the last 168 hours. No results for input(s): AMMONIA in the last 168 hours.  ABG    Component Value Date/Time   PHART 7.324 (L) 03/04/2019 0502   PCO2ART 65.1 (HH) 03/04/2019 0502   PO2ART 42.0 (L) 03/04/2019 0502   HCO3 33.7 (H) 03/04/2019 0502   TCO2 36 (H) 03/04/2019 0502   O2SAT 70.0 03/04/2019 0502     Coagulation Profile: Recent Labs  Lab 03/04/19 0510 03/05/19 0500 03/06/19 0400 03/07/19 0450 03/08/19 0443  INR 1.8* 1.7* 1.5* 1.1 1.1    Cardiac Enzymes: No results for input(s): CKTOTAL, CKMB, CKMBINDEX, TROPONINI in the last 168 hours.  HbA1C: Hemoglobin A1C  Date/Time Value Ref Range Status  09/22/2012 02:49 AM 5.9 4.2 - 6.3 % Final    Comment:    The American Diabetes Association recommends that a primary goal of therapy should be <7% and that physicians should reevaluate the treatment regimen in patients with HbA1c values consistently >8%.     CBG: Recent Labs  Lab 03/08/19 0401 03/08/19 0730 03/08/19  1130 03/08/19 1517 03/08/19 1930  GLUCAP 191* 210* 207* 191* 172*     Critical care time: 48 minutes    The patient is critically ill with multiple organ systems failure and requires high complexity decision making for assessment and support, frequent evaluation and titration of therapies, application of advanced monitoring technologies and extensive interpretation of multiple databases.   Discussed and co-managed patient care with PCCM-Hospitalist. Coordinated care with RT, RN and pharmacist.  Rodman Pickle, M.D. [facility] Pulmonary/Critical Care Medicine 03/08/2019 9:23 PM  Pager: [phone] After hours pager: [phone]

## 2019-03-08 NOTE — Consult Note (Signed)
Consultation Note Date: 03/08/2019   Patient Name: Clayton Sawyer  DOB: 01-26-1941  MRN: [id number]  Age / Sex: 78 y.o., male  PCP: Jodi Marble, MD Referring Physician: Patrecia Pour, MD  Reason for Consultation: Establishing goals of care  HPI/Patient Profile: 78 y.o. male  with past medical history of HTN, CAD, pacemaker placement, prostate cancer, stroke, obesity and anxiety admitted on 02/24/2019 with shortness of breath. He was diagnosed with respiratory failure d/t COVID-19 infection. Extensive goals of care conversations have been had with patient and family throughout hospitalization - On 7/28 patient required intubation and chest tube placement.  Patient has not had improvement since intubation and family has voiced desires to continue full measures. He was made DNR by previous provider. PMT consulted to assist with [place] conversations.   Clinical Assessment and Goals of Care: I have reviewed medical records including EPIC notes, labs and imaging, received report from RN - RN shares that patient remains on high ventilator settings, sats 89-92%. Patient is more unstable with movement - unable to prone him. Requiring propofol and dilaudid infusion for vent compliance - when sedation is weaned patient very agitated, increased resp rate, and sats drop.   I then spoke with patient's wife, Clayton Sawyer, to discuss diagnosis prognosis, [place], EOL wishes, disposition and options.  We discussed a brief life review of the patient. 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 She tells me he was doing great - very energetic and independent and she was not concerned about his health.   She also shares with me that patient's granddaughter is very involved in patient's care - she shares she and the patient raised the granddaughter so the patient is more of a father to her than grandfather.    We discussed  his current illness and what it means in the larger context of his on-going co-morbidities.  Clayton Sawyer shares with me about her multiple conversations with healthcare providers over the previous weeks - she tells me she understands the patient is very ill and may not recover but she is not ready to make any decisions to shift away from aggressive care.   I attempted to elicit values and goals of care important to the patient. She tells me that the patient has discussed his wishes with her before - she tells me she knows he would never want to be resuscitated but he would want all other aggressive measures if there were "any sliver of hope" he may recover.   She tells me about multiple families reaching out to her during this situation telling her about stories of patients just as sick as this patient who recovered. This makes her feel as though she needs to continue current aggressive measures if there is any chance he could recover. She tells me "things would have to get a lot worse" before she would make any decisions to shift his care more towards comfort. We discussed that Mr. Mapel is receiving very intense care already and there is not much room to increase his current care - she tells me she understands this.     She shares her belief about God being in control of situation and "its in His hands".   We discuss the very likely reality that the patient may pass away despite our best efforts/despite aggressive care - she acknowledges this and seems to indicate that this is acceptable because then at least they would know everything has been  tried and "no one gave up on him".   She asks if this could continue to go on for weeks - I explain that its possible however it does appear that patient is becoming more unstable and its more likely that he will pass away. She tells me if this does continue to go on - she would be interested in all care offered such as trach.   Clayton Sawyer requests multiple times  during conversation for nurse to call and update her granddaughter - I have asked nurse to do this.   Questions and concerns were addressed. The family was encouraged to call with questions or concerns.   Primary Decision Maker NEXT OF KIN - wife - Clayton Sawyer   SUMMARY OF RECOMMENDATIONS   - family continues to desire all aggressive interventions offered  - discussed poor prognosis and likely reality that patient may die despite aggressive care - wife accepts this and states they would know everything had been tried and "no one gave up" - at this time, seems unlikely that family will shift away from aggressive care and family may need to see this play out - they feel like shifting to comfort would mean they gave up - of note, their community encouraging them to continue all efforts and sharing stories about very ill patients who have recovered - this significantly impacts their healthcare decisions - family would agree to all medical care offered including trach  Code Status/Advance Care Planning:  DNR - decided with previous provider, not discussed with me  Additional Recommendations (Limitations, Scope, Preferences):  Full Scope Treatment  Prognosis:   Unable to determine - very poor prognosis  Discharge Planning: Anticipated Hospital Death ??     Primary Diagnoses: Present on Admission: . Acute respiratory disease due to COVID-19 virus . Essential (primary) hypertension . Presence of cardiac pacemaker . Atherosclerotic heart disease of native coronary artery without angina pectoris/PCI stents 09/2004, 03/2009 and 10/2011 . COVID-19 virus infection . Dyspnea and respiratory abnormalities . Obesity (BMI 30-39.9) . Anxiety disorder . Thrombocytopenia ([place])   I have reviewed the medical record, interviewed the patient and family, and examined the patient. The following aspects are pertinent.  Past Medical History:  Diagnosis Date  . Abdominal hernia without obstruction  or gangrene 05/22/2014  . 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 March 2006 reports two drug-eluting stents placed in the right coronary artery (reported this procedure not currently available).  2. December 2006 cardiac catheterization:  EF 78%, 50% stenosis in the ramus intermedius and the right posterior diagonal artery.  Patient treated medically.  3. December 2006 cardiac MRI negative for ischemia.  EF normal.  4. April 2008 cardiac catheteri  . Calculus of kidney 09/26/2011   Overview:  1. Has bilateral retained kidney stones.  2. Status post urethral stenting.  Overview:  Overview:  1. Has bilateral retained kidney stones.  2. Status post urethral stenting.   . Chronotropic incompetence 07/22/2011   Overview:  Status post permanent pacemaker placement 06/12/2009 by Dr. Norm Salt.  Overview:  Overview:  Status post permanent pacemaker placement 06/12/2009 by Dr. Norm Salt.   . Diastolic dysfunction 66/12/3014   Overview:  1. June 2009 had progressive orthopnea and dyspnea on exertion and lower extremity edema.  2. July 2009 2D echocardiogram: EF >55%, mild concentric LVH, diastolic dysfunction Grade II, LAE 4.6 cm, mild tricuspid regurgitation.  Overview:  Overview:  1. June 2009 had progressive orthopnea and  dyspnea on exertion and lower extremity edema.  2. July 2009 2D echocardiogram: EF >55%, mild   . Essential (primary) hypertension 07/22/2011  . 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   . Hyperlipidemia 07/22/2011   Overview:  1. December 2006 TC 142, TG 92, HDL 32, LDL 93.  2. November 2008 TC 181, TG 97, HDL 29, LDL 33.  3. January 2010 LDL 137, HDL 29.  4. Referral to Dr. Harrell Gave clinic. Patient started on Niacin,  which he did not tolerate. Additionally started on Crestor alternating 2.5 mg every other day, which he also was unable to tolerate. Overview:  Overview:  1. December 2006 TC 142, TG 92, HDL 3  . Hypertension   . Malignant neoplasm of prostate ([place]) 03/04/2013  . MI, old   . Migraine without status migrainosus, not intractable 05/22/2014  . Osteoarthritis 09/26/2011   Overview:  Right total knee replacement.  Overview:  Overview:  Right total knee replacement.   . Presence of cardiac pacemaker 07/21/2011   Overview:  Dual-chamber pacemaker is a Biotronik via DR reference number [id number] serial number [id number].  Right atrial lead is a Comptroller number [id number] serial number [id number].  Right ventricular lead is Biotronik Setrox [id number] reference number is [id number] serial number [id number]. Device implanted 06/11/2009 Overview:  Overview:  Dual-chamber pacemaker is a Biotronik via DR reference n  . Varicella without complication 19/62/2297   Social History   Socioeconomic History  . Marital status: Married    Spouse name: Not on file  . Number of children: Not on file  . Years of education: Not on file  . Highest education level: Not on file  Occupational History  . Not on file  Social Needs  . Financial resource strain: Not on file  . Food insecurity    Worry: Not on file    Inability: Not on file  . Transportation needs    Medical: Not on file    Non-medical: Not on file  Tobacco Use  . Smoking status: Never Smoker  . Smokeless tobacco: Never Used  Substance and Sexual Activity  . Alcohol use: No  . Drug use: No  . Sexual activity: Yes  Lifestyle  . Physical activity    Days per week: Not on file    Minutes per session: Not on file  . Stress: Not on file  Relationships  . Social Herbalist on phone: Not on file    Gets together: Not on file    Attends religious service: Not on file    Active member of club or organization: Not on file    Attends meetings  of clubs or organizations: Not on file    Relationship status: Not on file  Other Topics Concern  . Not on file  Social History Narrative  . Not on file   Family History  Problem Relation Age of Onset  . Prostate cancer Neg Hx   . Bladder Cancer Neg Hx   . Kidney cancer Neg Hx    Scheduled Meds: . chlorhexidine gluconate (MEDLINE KIT)  15 mL Mouth Rinse BID  . Chlorhexidine Gluconate Cloth  6 each Topical Daily  . clonazePAM  2 mg Per Tube Q8H  . enoxaparin (LOVENOX) injection  0.5 mg/kg Subcutaneous BID  . feeding supplement (PRO-STAT SUGAR FREE 64)  60 mL Per Tube TID  . free water  300 mL Per Tube Q6H  . [START ON 03/09/2019] furosemide  40 mg Intravenous Daily  . insulin aspart  0-20 Units Subcutaneous Q4H  . insulin aspart  3 Units Subcutaneous Q4H  . mouth rinse  15 mL Mouth Rinse 10 times per day  . [START ON 03/09/2019] methylPREDNISolone sodium succinate  40 mg Intravenous Q24H  . multivitamin with minerals  1 tablet Per Tube Daily  . oxyCODONE  10 mg Oral Q6H  . pantoprazole sodium  40 mg Per Tube Daily  . polyethylene glycol  17 g Per Tube BID  . sodium chloride flush  3 mL Intravenous Q12H  . [START ON 03/13/2019] pamrevlumab or placebo  35 mg/kg Intravenous Once  . traZODone  100 mg Oral QHS   Continuous Infusions: . sodium chloride    . dextrose    . feeding supplement (VITAL 1.5 CAL) 45 mL/hr at 03/08/19 0800  . HYDROmorphone 2 mg/hr (03/08/19 0900)  . midazolam Stopped (03/06/19 0910)  . norepinephrine (LEVOPHED) Adult infusion Stopped (03/06/19 0847)  . propofol (DIPRIVAN) infusion 35 mcg/kg/min (03/08/19 0900)   PRN Meds:.sodium chloride, acetaminophen **OR** acetaminophen, guaiFENesin-codeine, guaiFENesin-dextromethorphan, hydrALAZINE, HYDROmorphone, Ipratropium-Albuterol, lip balm, midazolam, [DISCONTINUED] ondansetron **OR** ondansetron (ZOFRAN) IV, phenol, sodium chloride, sodium chloride flush Allergies  Allergen Reactions  . Lisinopril Cough  .  Losartan Rash  . Statins Other (See Comments)    Body aches     Vital Signs: BP (!) 143/60   Pulse (!) 59   Temp 98.2 F (36.8 C) (Oral)   Resp (!) 30   Ht 5' 9"  (1.753 m)   Wt 113.4 kg   SpO2 91%   BMI 36.92 kg/m  Pain Scale: CPOT POSS *See Group Information*: 1-Acceptable,Awake and alert Pain Score: 0-No pain   SpO2: SpO2: 91 % O2 Device:SpO2: 91 % O2 Flow Rate: .O2 Flow Rate (L/min): 35 L/min  IO: Intake/output summary:   Intake/Output Summary (Last 24 hours) at 03/08/2019 1034 Last data filed at 03/08/2019 0900 Gross per 24 hour  Intake 2329.13 ml  Output 3875 ml  Net -1545.87 ml    LBM: Last BM Date: 03/08/19 Baseline Weight: Weight: 105.7 kg Most recent weight: Weight: (unable to obtain; scale function not working)     Palliative Assessment/Data: PPS 30% (tube feeds)    The above conversation was completed via telephone due to the visitor restrictions during the COVID-19 pandemic. Thorough chart review and discussion with necessary members of the care team was completed as part of assessment. All issues were discussed and addressed but no physical exam was performed.  Time Total: 70 minutes Greater than 50%  of this time was spent counseling and coordinating care related to the above assessment and plan.  Juel Burrow, DNP, AGNP-C Palliative Medicine Team [phone] Pager: [phone]

## 2019-03-08 NOTE — Progress Notes (Signed)
Pt had his foley removed at 1845. Since removal he has voided 100 mL into his external cath. Bladder scan shows 859 mL still present in his bladder. Per protocol, in and out cath, using sterile technique,  was used to remove remaining urine. Performed peri care before and after.  Removed 850 mL of amber colored urine. A new external cath was placed.

## 2019-03-09 LAB — CBC WITH DIFFERENTIAL/PLATELET
Abs Immature Granulocytes: 0.09 10*3/uL — ABNORMAL HIGH (ref 0.00–0.07)
Basophils Absolute: 0 10*3/uL (ref 0.0–0.1)
Basophils Relative: 0 %
Eosinophils Absolute: 0.6 10*3/uL — ABNORMAL HIGH (ref 0.0–0.5)
Eosinophils Relative: 7 %
HCT: 36.9 % — ABNORMAL LOW (ref 39.0–52.0)
Hemoglobin: 11.2 g/dL — ABNORMAL LOW (ref 13.0–17.0)
Immature Granulocytes: 1 %
Lymphocytes Relative: 9 %
Lymphs Abs: 0.7 10*3/uL (ref 0.7–4.0)
MCH: 32 pg (ref 26.0–34.0)
MCHC: 30.4 g/dL (ref 30.0–36.0)
MCV: 105.4 fL — ABNORMAL HIGH (ref 80.0–100.0)
Monocytes Absolute: 0.5 10*3/uL (ref 0.1–1.0)
Monocytes Relative: 6 %
Neutro Abs: 6.5 10*3/uL (ref 1.7–7.7)
Neutrophils Relative %: 77 %
Platelets: 107 10*3/uL — ABNORMAL LOW (ref 150–400)
RBC: 3.5 MIL/uL — ABNORMAL LOW (ref 4.22–5.81)
RDW: 17.7 % — ABNORMAL HIGH (ref 11.5–15.5)
WBC: 8.5 10*3/uL (ref 4.0–10.5)
nRBC: 0 % (ref 0.0–0.2)

## 2019-03-09 LAB — POCT I-STAT 7, (LYTES, BLD GAS, ICA,H+H)
Acid-Base Excess: 15 mmol/L — ABNORMAL HIGH (ref 0.0–2.0)
Acid-Base Excess: 10 mmol/L — ABNORMAL HIGH (ref 0.0–2.0)
Bicarbonate: 43.8 mmol/L — ABNORMAL HIGH (ref 20.0–28.0)
Bicarbonate: 41.3 mmol/L — ABNORMAL HIGH (ref 20.0–28.0)
Calcium, Ion: 1.2 mmol/L (ref 1.15–1.40)
Calcium, Ion: 1.17 mmol/L (ref 1.15–1.40)
HCT: 35 % — ABNORMAL LOW (ref 39.0–52.0)
HCT: 42 % (ref 39.0–52.0)
Hemoglobin: 11.9 g/dL — ABNORMAL LOW (ref 13.0–17.0)
Hemoglobin: 14.3 g/dL (ref 13.0–17.0)
O2 Saturation: 90 %
O2 Saturation: 93 %
Patient temperature: 98.1
Patient temperature: 99.2
Potassium: 4.2 mmol/L (ref 3.5–5.1)
Potassium: 5.4 mmol/L — ABNORMAL HIGH (ref 3.5–5.1)
Sodium: 145 mmol/L (ref 135–145)
Sodium: 143 mmol/L (ref 135–145)
TCO2: 46 mmol/L — ABNORMAL HIGH (ref 22–32)
TCO2: 44 mmol/L — ABNORMAL HIGH (ref 22–32)
pCO2 arterial: 74.3 mmHg (ref 32.0–48.0)
pCO2 arterial: >97 mmHg (ref 32.0–48.0)
pH, Arterial: 7.377 (ref 7.350–7.450)
pH, Arterial: 7.236 — ABNORMAL LOW (ref 7.350–7.450)
pO2, Arterial: 62 mmHg — ABNORMAL LOW (ref 83.0–108.0)
pO2, Arterial: 85 mmHg (ref 83.0–108.0)

## 2019-03-09 LAB — COMPREHENSIVE METABOLIC PANEL
ALT: 49 U/L — ABNORMAL HIGH (ref 0–44)
AST: 31 U/L (ref 15–41)
Albumin: 2.3 g/dL — ABNORMAL LOW (ref 3.5–5.0)
Alkaline Phosphatase: 43 U/L (ref 38–126)
Anion gap: 8 (ref 5–15)
BUN: 75 mg/dL — ABNORMAL HIGH (ref 8–23)
CO2: 38 mmol/L — ABNORMAL HIGH (ref 22–32)
Calcium: 7.8 mg/dL — ABNORMAL LOW (ref 8.9–10.3)
Chloride: 99 mmol/L (ref 98–111)
Creatinine, Ser: 1.08 mg/dL (ref 0.61–1.24)
GFR calc Af Amer: >60 mL/min (ref >60)
GFR calc non Af Amer: >60 mL/min (ref >60)
Glucose, Bld: 138 mg/dL — ABNORMAL HIGH (ref 70–99)
Potassium: 4.2 mmol/L (ref 3.5–5.1)
Sodium: 145 mmol/L (ref 135–145)
Total Bilirubin: 0.7 mg/dL (ref 0.3–1.2)
Total Protein: 4.3 g/dL — ABNORMAL LOW (ref 6.5–8.1)

## 2019-03-09 LAB — GLUCOSE, CAPILLARY
Glucose-Capillary: 140 mg/dL — ABNORMAL HIGH (ref 70–99)
Glucose-Capillary: 160 mg/dL — ABNORMAL HIGH (ref 70–99)
Glucose-Capillary: 199 mg/dL — ABNORMAL HIGH (ref 70–99)
Glucose-Capillary: 200 mg/dL — ABNORMAL HIGH (ref 70–99)
Glucose-Capillary: 206 mg/dL — ABNORMAL HIGH (ref 70–99)
Glucose-Capillary: 192 mg/dL — ABNORMAL HIGH (ref 70–99)

## 2019-03-09 LAB — PROTIME-INR
INR: 1.1 (ref 0.8–1.2)
Prothrombin Time: 13.6 seconds (ref 11.4–15.2)

## 2019-03-09 MED ORDER — SODIUM CHLORIDE 0.9 % IV SOLN
1.0000 mg/h | INTRAVENOUS | Status: DC
  Administered 2019-03-09: 3 mg/h via INTRAVENOUS
  Administered 2019-03-10: 2 mg/h via INTRAVENOUS
  Administered 2019-03-11 – 2019-03-15 (×5): 2.5 mg/h via INTRAVENOUS
  Administered 2019-03-16: 1.5 mg/h via INTRAVENOUS
  Filled 2019-03-09 (×13): qty 5

## 2019-03-09 MED ORDER — VECURONIUM BROMIDE 10 MG IV SOLR
0.1000 mg/kg | INTRAVENOUS | Status: DC | PRN
  Filled 2019-03-09: qty 20

## 2019-03-09 MED ORDER — ARTIFICIAL TEARS OPHTHALMIC OINT
1.0000 "application " | TOPICAL_OINTMENT | Freq: Three times a day (TID) | OPHTHALMIC | Status: DC
  Administered 2019-03-09 – 2019-03-16 (×22): 1 via OPHTHALMIC
  Filled 2019-03-09 (×5): qty 3.5

## 2019-03-09 MED ORDER — NOREPINEPHRINE 4 MG/250ML-% IV SOLN: 0.0000 ug/min | INTRAVENOUS | Status: DC

## 2019-03-09 MED ORDER — METOPROLOL TARTRATE 5 MG/5ML IV SOLN
2.5000 mg | INTRAVENOUS | Status: DC | PRN
  Administered 2019-03-09: 03:00:00 2.5 mg via INTRAVENOUS
  Filled 2019-03-09: qty 5

## 2019-03-09 MED ORDER — METHYLPREDNISOLONE SODIUM SUCC 40 MG IJ SOLR
40.0000 mg | Freq: Every day | INTRAMUSCULAR | Status: AC
  Administered 2019-03-10 – 2019-03-11 (×2): 40 mg via INTRAVENOUS
  Filled 2019-03-09 (×2): qty 1

## 2019-03-09 MED ORDER — METOPROLOL TARTRATE 5 MG/5ML IV SOLN
5.0000 mg | Freq: Once | INTRAVENOUS | Status: AC
  Administered 2019-03-09: 17:00:00 2.5 mg via INTRAVENOUS
  Filled 2019-03-09: qty 5

## 2019-03-09 MED ORDER — METHYLPREDNISOLONE SODIUM SUCC 40 MG IJ SOLR
10.0000 mg | Freq: Every day | INTRAMUSCULAR | Status: AC
  Administered 2019-03-14 – 2019-03-15 (×2): 10 mg via INTRAVENOUS
  Filled 2019-03-09 (×2): qty 1

## 2019-03-09 MED ORDER — SODIUM CHLORIDE 0.9 % IV SOLN: INTRAVENOUS | Status: DC | PRN

## 2019-03-09 MED ORDER — VITAL 1.5 CAL PO LIQD
1000.0000 mL | ORAL | Status: DC
  Administered 2019-03-09 – 2019-03-15 (×4): 1000 mL
  Filled 2019-03-09 (×7): qty 1000

## 2019-03-09 MED ORDER — FUROSEMIDE 10 MG/ML IJ SOLN
40.0000 mg | Freq: Once | INTRAMUSCULAR | Status: AC
  Administered 2019-03-09: 40 mg via INTRAVENOUS
  Filled 2019-03-09: qty 4

## 2019-03-09 MED ORDER — METOPROLOL TARTRATE 25 MG/10 ML ORAL SUSPENSION: 50.0000 mg | Freq: Two times a day (BID) | ORAL | Status: DC

## 2019-03-09 MED ORDER — METHYLPREDNISOLONE SODIUM SUCC 40 MG IJ SOLR
20.0000 mg | Freq: Every day | INTRAMUSCULAR | Status: AC
  Administered 2019-03-12 – 2019-03-13 (×2): 20 mg via INTRAVENOUS
  Filled 2019-03-09 (×2): qty 1

## 2019-03-09 MED ORDER — INSULIN ASPART 100 UNIT/ML ~~LOC~~ SOLN
6.0000 [IU] | SUBCUTANEOUS | Status: DC
  Administered 2019-03-09 – 2019-03-16 (×41): 6 [IU] via SUBCUTANEOUS

## 2019-03-09 MED ORDER — PROPOFOL 1000 MG/100ML IV EMUL
25.0000 ug/kg/min | INTRAVENOUS | Status: DC
  Administered 2019-03-09 (×2): 25 ug/kg/min via INTRAVENOUS
  Administered 2019-03-10: 45 ug/kg/min via INTRAVENOUS
  Administered 2019-03-10 – 2019-03-11 (×5): 35 ug/kg/min via INTRAVENOUS
  Administered 2019-03-11: 30 ug/kg/min via INTRAVENOUS
  Administered 2019-03-11: 25 ug/kg/min via INTRAVENOUS
  Administered 2019-03-11: 35 ug/kg/min via INTRAVENOUS
  Administered 2019-03-11 – 2019-03-12 (×3): 25 ug/kg/min via INTRAVENOUS
  Filled 2019-03-09 (×15): qty 100

## 2019-03-09 NOTE — Progress Notes (Signed)
RT NOTE:  Sputum culture collected via ETT tube. Sample labeled & sent to lab in bio bag.

## 2019-03-09 NOTE — Procedures (Signed)
**Note De-Identified Guiliana Shor Obfuscation** Arterial Catheter Insertion Procedure Note DELMAR ARRIAGA [id number] 21-Nov-1940  Procedure: Insertion of Arterial Catheter  Indications: Blood pressure monitoring and Frequent blood sampling  Procedure Details Consent: Risks of procedure as well as the alternatives and risks of each were explained to the (patient/caregiver).  Consent for procedure obtained. Time Out: Verified patient identification, verified procedure, site/side was marked, verified correct patient position, special equipment/implants available, medications/allergies/relevent history reviewed, required imaging and test results available.  Performed  Maximum sterile technique was used including antiseptics, gloves, gown, hand hygiene, mask and sheet. Skin prep: Chlorhexidine; local anesthetic administered 20 gauge catheter was inserted into left radial artery using the Seldinger technique. ULTRASOUND GUIDANCE USED: NO Evaluation Blood flow good; BP tracing good. Complications: No apparent complications.   Jervis Trapani, Penni Bombard 03/09/2019

## 2019-03-09 NOTE — Progress Notes (Addendum)
RT NOTE:  Pt in prone position. RT assisted RN with head turn to the right. ETT secured throughout @ 26/lip

## 2019-03-09 NOTE — Progress Notes (Addendum)
**Note De-Identified  Obfuscation** Critical ABG reported to MD (secure chat)

## 2019-03-09 NOTE — Progress Notes (Signed)
NAME:  Clayton Sawyer, MRN:  [id number], DOB:  1940/08/18, LOS: 24 ADMISSION DATE:  02/23/2019, CONSULTATION DATE: February 13, 2019 REFERRING MD: Dr. 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 Hyperlipidemia History of prostate cancer Obstructive sleep apnea  Significant Hospital Events   7/15 Admit, transfer to [facility] 7/16 enrolled in fibrinogen research drug  7/27 PCCM consulted again for worsening oxygenation, pneumothorax on right 7/28 transferred to the intensive care unit, full code, intubated, right-sided chest tube 7/31 worsening vent settings, severe hypoxemia, high sedation needs 8/2 Worsening ventilator dyssynchrony overnight when attempted to stop Precedex, started bivalrudin 8/4 made DNR, worsening oxygen needs, ventilator synchrony 8/5 family requested transfer to [facility], [facility] made call, the did not accept 8/6 palliative care consult  Consults:  Pulmonary and critical care medicine  Procedures:  ETT 7/28 CVC 7/28 Pigtail R 7/28  Significant Diagnostic Tests:    Micro Data:  7/10, 7/15 SARS-CoV-2 positive 7/16 blood > coag neg staph in 1/4 7/31 blood >  7/31 resp > enterobacter, pseudomonas  Antimicrobials/COVID treatment  Ceftriaxone July 14 Azithromycin July 15 Remdesivir July 15 > July 19 Actemra July 15, July 19 Vancomycin July 18, July 19 Solumedrol July 14 Pamrevlumab vs placebo, next scheduled infusion July 29 Cefepime 7/31>   Interim history/subjective:   ABG reviewed with P:F <100. Proned this morning.  Objective   Blood pressure (!) 126/48, pulse 94, temperature 98.5 F (36.9 C), temperature source Axillary, resp. rate 20, height 5\' 9"  (1.753 m), weight 108.8 kg, SpO2 92 %.    Vent Mode: PCV FiO2 (%):  [70 %-100 %] 80 % Set Rate:   [30 bmp] 30 bmp PEEP:  [12 cmH20] 12 cmH20 Plateau Pressure:  [35 cmH20-38 cmH20] 35 cmH20   Intake/Output Summary (Last 24 hours) at 03/09/2019 1659 Last data filed at 03/09/2019 1500 Gross per 24 hour  Intake 1579.22 ml  Output 4480 ml  Net -2900.78 ml   Filed Weights   03/06/19 0500 03/07/19 0254 03/09/19 0500  Weight: 111.4 kg 113.4 kg 108.8 kg   Examination: General:  In bed on vent HENT: NCAT ETT in place PULM: Rhonchi bilaterally, vent supported breathing, right chest tube in place with no evidence of airleak CV: RRR, no mgr GI: BS+, soft, nontender MSK: normal bulk and tone Extremities: 2+ pitting edema in lower extremities Neuro: sedated on vent  Resolved Hospital Problem list     Assessment & Plan:  ARDS due to COVID-19 pneumonia: C/b PTX and HCAP. Vent Day 11.  Plan Continue mechanical ventilation per ARDS protocol Target TVol 6-8cc/kgIBW Target Plateau Pressure < 30cm H20 Target driving pressure less than 15 cm of water Target PaO2 55-65: titrate PEEP/FiO2 per protocol As long as PaO2 to FiO2 ratio is less than 1:150 position in prone position for 16 hours a day Check CVP daily if CVL in place Target CVP less than 4, diurese as necessary Ventilator associated pneumonia prevention protocol Proning protocol with intermittent paralytics Diurese now 8/8: Post-proning ABG demonstrated hypercapnia. Changed from PCV to PRVC withTV increased to 8cc/hr  Right sided pneumothorax status post 14 French pigtail chest tube catheter Continue chest tube to suction until extubated  Recurrent leukocytosis and persistent hypoxemia HCAP: pseudomonas and enterobacter S/p Cefepime x 7 days Repeat trach aspirate and blood cultures  Agitation requiring sedation:  episodes of dyssynchrony, fentanyl tachyphylaxis? Continue kentamine, dilaudid and propofol for goal RASS -4 or -5 Intermittent paralytic protocol for proning Continue oxycodone, clonazepam  Thromboctytopenia: not  HITT, improving Monitor for bleeding Continue lovenox 0.5mg  sq bid Solumedrol x 10 days. Start taper tomorrow  GOC:  Palliative Care consulted. Family requested transfer to another facility which was attempted on 8/5 however request for transfer was denied. Family wishes to remain aggressive including pursuing tracheostomy.  Reasonable to pursue trial of proning for single organ injury though his status remains critical in setting of prolonged illness. Prognosis remains guarded.  Best practice:  Diet: tube feeding Pain/Anxiety/Delirium protocol (if indicated): yes, see above VAP protocol (if indicated): yes DVT prophylaxis: SCD GI prophylaxis: Pantoprazole for stress ulcer prophylaxis Glucose control: per TRH Mobility: bed rest Code Status: DNR Family Communication: per TRH. Had joint meeting with TRH and family (granddaughter and wife) on 8/8 to update as noted above. Addressed questions. Disposition: remain in ICU  Labs   CBC: Recent Labs  Lab 03/05/19 0500 03/06/19 0400 03/07/19 0450 03/08/19 0443 03/09/19 0446 03/09/19 0754  WBC 9.2 10.4 8.8 14.3* 8.5  --   NEUTROABS  --   --  8.1* 13.4* 6.5  --   HGB 12.6* 12.6* 11.6* 13.4 11.2* 11.9*  HCT 41.8 42.9 40.0 45.7 36.9* 35.0*  MCV 103.7* 103.4* 104.4* 104.3* 105.4*  --   PLT 69* 85* 78* 125* 107*  --     Basic Metabolic Panel: Recent Labs  Lab 03/05/19 0500 03/06/19 0400 03/07/19 0450 03/08/19 0443 03/09/19 0446 03/09/19 0754  NA 152* 150* 149* 147* 145 145  K 4.7 5.2* 4.8 4.8 4.2 4.2  CL 112* 111 107 99 99  --   CO2 35* 37* 35* 38* 38*  --   GLUCOSE 195* 207* 199* 247* 138*  --   BUN 50* 56* 54* 67* 75*  --   CREATININE 0.78 0.91 0.77 1.01 1.08  --   CALCIUM 8.1* 8.2* 7.8* 8.6* 7.8*  --    GFR: Estimated Creatinine Clearance: 68.5 mL/min (by C-G formula based on SCr of 1.08 mg/dL). Recent Labs  Lab 03/03/19 0430  03/06/19 0400 03/07/19 0450 03/08/19 0443 03/09/19 0446  PROCALCITON 0.16  --   --   --    --   --   WBC 10.3   < > 10.4 8.8 14.3* 8.5   < > = values in this interval not displayed.    Liver Function Tests: Recent Labs  Lab 03/05/19 0500 03/06/19 0400 03/07/19 0450 03/08/19 0443 03/09/19 0446  AST 28 31 31  39 31  ALT 38 45* 49* 59* 49*  ALKPHOS 47 50 42 55 43  BILITOT 0.9 0.7 0.9 1.0 0.7  PROT 4.4* 4.7* 4.4* 5.4* 4.3*  ALBUMIN 2.3* 2.4* 2.2* 2.7* 2.3*   No results for input(s): LIPASE, AMYLASE in the last 168 hours. No results for input(s): AMMONIA in the last 168 hours.  ABG    Component Value Date/Time   PHART 7.377 03/09/2019 0754   PCO2ART 74.3 (HH) 03/09/2019 0754   PO2ART 62.0 (L) 03/09/2019 0754   HCO3 43.8 (H) 03/09/2019 0754   TCO2 46 (H) 03/09/2019 0754   O2SAT 90.0 03/09/2019 0754     Coagulation Profile: Recent Labs  Lab 03/05/19 0500 03/06/19 0400 03/07/19 0450 03/08/19 0443 03/09/19 0446  INR 1.7* 1.5* 1.1 1.1 1.1    Cardiac Enzymes: No results for input(s): CKTOTAL, CKMB, CKMBINDEX, TROPONINI in the last 168 hours.  HbA1C: Hemoglobin A1C  Date/Time Value Ref Range Status  09/22/2012 02:49 AM 5.9 4.2 - 6.3 % Final    Comment:    The American Diabetes Association recommends that a primary goal of therapy should be <7% and that physicians should reevaluate the treatment regimen in patients with HbA1c values consistently >8%.     CBG: Recent Labs  Lab 03/08/19 2330 03/09/19 0348 03/09/19 0733 03/09/19 1147 03/09/19 1608  GLUCAP 183* 140* 160* 199* 200*     Critical care time: 35 minutes    The patient is critically ill with multiple organ systems failure and requires high complexity decision making for assessment and support, frequent evaluation and titration of therapies, application of advanced monitoring technologies and extensive interpretation of multiple databases.   Discussed and co-managed patient care with PCCM-Hospitalist. Coordinated care with RT, RN and pharmacist.  Rodman Pickle, M.D. [facility]  Pulmonary/Critical Care Medicine 03/09/2019 5:00 PM  Pager: [phone] After hours pager: [phone]

## 2019-03-09 NOTE — Progress Notes (Signed)
Pt's wife, Enid Derry, called and updated on Mr. Ziaire condition and plan of care for today, including plan to prone him. Her questioned were answered, and she thanked the staff for their care.

## 2019-03-09 NOTE — Progress Notes (Signed)
After TF off for over an hour, pt's anterior bony prominences were padded with foam dressings and he was given a bolus of Dilaudid 1mg  IV. Pt was then turned to the prone position, per MD order, with the assistance of 3 RNs and one RT. All tubes/lines remained in place during the transition to the prone position.

## 2019-03-09 NOTE — Progress Notes (Signed)
[name]  GQQ:[id number] DOB: 04/02/1941 DOA: 02/04/2019 PCP: Jodi Marble, MD   Brief Narrative: Clayton Sawyer is a 78 y.o. male with a history of HTN, CAD s/p PCI, PPM, and prostate CA who presented to [facility] with shortness of breath and subsequently admitted to [facility] on 01/31/2019 due to hypoxemic respiratory failure due to covid-19 infection, requiring heated HFNC in the ICU. Steroids were started, remdesivir given for 5 days, and tocilizumab given x2. Inflammatory markers improved, and hypoxemia remained stable. He was transferred out of the ICU 7/23 but subsequently decompensated. After extensive goals of care discussions with the patient, full code status was reinstated and he was transferred back to the ICU requiring intubation and chest tube placement for pneumothorax on 7/28.  With rising leukocytosis, sputum culture was sent and grew pseudomonas and enterobacter which were treated with vancomycin and cefepime initially, now completing a course of cefepime alone based on susceptibility data. Steroids have continued, though the patient has made no clinical progress in the past week. Goals of care discussions were continued, though the family continues to wish for full measures. Will attempt proning 8/8.   Assessment & Plan: Principal Problem:   Acute respiratory disease due to COVID-19 virus Active Problems:   Presence of cardiac pacemaker   History of stroke   Essential (primary) hypertension   Atherosclerotic heart disease of native coronary artery without angina pectoris/PCI stents 09/2004, 03/2009 and 10/2011   COVID-19 virus infection   Dyspnea and respiratory abnormalities   Obesity (BMI 30-39.9)   Anxiety disorder   Thrombocytopenia (HCC)   Pressure injury of skin   Acute respiratory failure with hypoxia (HCC)   Goals of care, counseling/discussion   Palliative care by specialist  Acute hypoxic respiratory failure due to Covid-19 infection: s/p  tocilizumab x2, remdesivir x5 days.  - Continue IV steroids, though inflammatory marker normalization argues that the current process is irreversible. Will taper dose (orders placed).  - Also was enrolled in fibrogen study at admission, next infusion scheduled at 8/12. Granddaughter has inquired about RLF-100 which received IND designation and is undergoing clinical trial. This trial is not available currently at this center, and having already experienced respiratory failure, would not meet inclusion criteria. I also suspect having already enrolled in this trial may preclude enrollment in another.  - Vitamin C and zinc  - Continue airborne, contact precautions. - Thought to be unable to prone based on hemodynamic instability. Will trial today. - Continue ventilator settings and sedation per CCM. Oxygenation continues to be difficult with high FiO2/PEEP requirements and high pressures.  - Keep net negative balance with lasix, strict I/O. Follow metabolic panel. - Continue goals of care discussions pending clinical trajectory. Hopeful that proning is tolerated and helpful with oxygenation.  Septic shock due to HCAP due to pseudomonas and enterobacter:  - Completed 7 days of cefepime per susceptibility data (started 7/31), also received vancomycin x2 days. - Blood cultures drawn 7/31, negative. Initially had contaminants in blood cultures at admission. - Weaned off pressors.   - Leukocytosis returned 8/7, with concomitant rise in all cell lines suspect hemoconcentration from lasix with also risen BUN:Cr. No fever. Will continue monitoring on current antimicrobial regimen. Needs vigilant monitoring having received tocilizumab.  - Repeat blood and respiratory cultures sent 8/7, will continue to monitor and treat as indicated.  Right pneumothorax: s/p chest tube 7/28.  - Management per PCCM, remain to suction at [place] until extubated. Will monitor with intermittent CXR,  stable per my personal review  on 8/7.  Acute renal failure with hyperkalemia: - Give IV lasix  LFT elevation: Due to viral infection, improving.  Steroid-induced hyperglycemia:  - Augmented insulin with 5u + SSI resistant scale q4h. Will add 1 additional unit q4h to maintain tight control. With taper of steroids, will need close monitoring to avoid hypoglycemia.  Hypernatremia:  - Continue free water per tube and monitoring Na. Improved, wnl at 145 today.  Thrombocytopenia: Unclear etiology. Slightly improved. - HIT panel negative, changed bivalirudin back to lovenox. Continue to monitor for bleeding.   Stage I deep tissue injury: Not POA. Noted 8/1.   CAD s/p PCI: Stable.   Anxiety:  - Continue enteral and IV agents as ordered.  Prostate CA: Quiescent.   Obesity: BMI 36.  - Poor prognostic indicator.  - Continue tube feeding with SSI coverage to optimize glycemic control.  DVT prophylaxis: SCDs, bivalirudin Code Status: DNR Family Communication: Discussing with granddaughter and wife daily.  Disposition Plan: Uncertain, poor prognosis.  Consultants:   PCCM  Palliative care team  Procedures:   Intubation 7/28 >>  Right chest tube 7/28 >>  Antimicrobials:  Remdesivir  Vancomycin  Cefepime   Subjective: Appears comfortable, still with P:F <100. VSS over past 24 hours.  Objective: Vitals:   03/09/19 0426 03/09/19 0500 03/09/19 0600 03/09/19 0700  BP: (!) 93/46 (!) 94/55 (!) 90/46 (!) 101/55  Pulse: 78 84 68 (!) 111  Resp:  (!) 30 (!) 30 (!) 30  Temp:      TempSrc:      SpO2:  92% 93% 93%  Weight:  108.8 kg    Height:        Intake/Output Summary (Last 24 hours) at 03/09/2019 0933 Last data filed at 03/09/2019 0700 Gross per 24 hour  Intake 1375.35 ml  Output 4870 ml  Net -3494.65 ml   Filed Weights   03/06/19 0500 03/07/19 0254 03/09/19 0500  Weight: 111.4 kg 113.4 kg 108.8 kg   Gen: 78 y.o. male in no distress Pulm: Crackles bilaterally, vent-supported breaths CV:  Regular rate and rhythm. No murmur, rub, or gallop. No JVD, trace dependent edema. GI: Abdomen soft, non-tender, non-distended, with normoactive bowel sounds.  Ext: Warm, no deformities Skin: No new rashes, lesions or ulcers on visualized skin. Neuro: Sedated on ventilator with synchrony. Psych: UTD  Data Reviewed: I have personally reviewed following labs and imaging studies  CBC: Recent Labs  Lab 03/05/19 0500 03/06/19 0400 03/07/19 0450 03/08/19 0443 03/09/19 0446 03/09/19 0754  WBC 9.2 10.4 8.8 14.3* 8.5  --   NEUTROABS  --   --  8.1* 13.4* 6.5  --   HGB 12.6* 12.6* 11.6* 13.4 11.2* 11.9*  HCT 41.8 42.9 40.0 45.7 36.9* 35.0*  MCV 103.7* 103.4* 104.4* 104.3* 105.4*  --   PLT 69* 85* 78* 125* 107*  --    Basic Metabolic Panel: Recent Labs  Lab 03/05/19 0500 03/06/19 0400 03/07/19 0450 03/08/19 0443 03/09/19 0446 03/09/19 0754  NA 152* 150* 149* 147* 145 145  K 4.7 5.2* 4.8 4.8 4.2 4.2  CL 112* 111 107 99 99  --   CO2 35* 37* 35* 38* 38*  --   GLUCOSE 195* 207* 199* 247* 138*  --   BUN 50* 56* 54* 67* 75*  --   CREATININE 0.78 0.91 0.77 1.01 1.08  --   CALCIUM 8.1* 8.2* 7.8* 8.6* 7.8*  --    GFR: Estimated Creatinine Clearance: 68.5 mL/min (by  C-G formula based on SCr of 1.08 mg/dL). Liver Function Tests: Recent Labs  Lab 03/05/19 0500 03/06/19 0400 03/07/19 0450 03/08/19 0443 03/09/19 0446  AST 28 31 31  39 31  ALT 38 45* 49* 59* 49*  ALKPHOS 47 50 42 55 43  BILITOT 0.9 0.7 0.9 1.0 0.7  PROT 4.4* 4.7* 4.4* 5.4* 4.3*  ALBUMIN 2.3* 2.4* 2.2* 2.7* 2.3*   No results for input(s): LIPASE, AMYLASE in the last 168 hours. No results for input(s): AMMONIA in the last 168 hours. Coagulation Profile: Recent Labs  Lab 03/05/19 0500 03/06/19 0400 03/07/19 0450 03/08/19 0443 03/09/19 0446  INR 1.7* 1.5* 1.1 1.1 1.1   Cardiac Enzymes: No results for input(s): CKTOTAL, CKMB, CKMBINDEX, TROPONINI in the last 168 hours. BNP (last 3 results) No results for  input(s): PROBNP in the last 8760 hours. HbA1C: No results for input(s): HGBA1C in the last 72 hours. CBG: Recent Labs  Lab 03/08/19 1517 03/08/19 1930 03/08/19 2330 03/09/19 0348 03/09/19 0733  GLUCAP 191* 172* 183* 140* 160*   Lipid Profile: Recent Labs    03/07/19 1250  TRIG 571*   Thyroid Function Tests: No results for input(s): TSH, T4TOTAL, FREET4, T3FREE, THYROIDAB in the last 72 hours. Anemia Panel: Recent Labs    03/07/19 0450  FERRITIN 100   Urine analysis:    Component Value Date/Time   COLORURINE YELLOW 03/01/2019 1530   APPEARANCEUR HAZY (A) 03/01/2019 1530   APPEARANCEUR Clear 04/18/2013 2216   LABSPEC 1.024 03/01/2019 1530   LABSPEC 1.025 04/18/2013 2216   PHURINE 5.0 03/01/2019 1530   GLUCOSEU NEGATIVE 03/01/2019 1530   GLUCOSEU Negative 04/18/2013 2216   HGBUR SMALL (A) 03/01/2019 1530   BILIRUBINUR NEGATIVE 03/01/2019 1530   BILIRUBINUR Negative 04/18/2013 2216   KETONESUR NEGATIVE 03/01/2019 1530   PROTEINUR NEGATIVE 03/01/2019 1530   NITRITE POSITIVE (A) 03/01/2019 1530   LEUKOCYTESUR MODERATE (A) 03/01/2019 1530   LEUKOCYTESUR Negative 04/18/2013 2216   Recent Results (from the past 240 hour(s))  MRSA PCR Screening     Status: None   Collection Time: 03/01/19  1:18 PM   Specimen: Nasal Mucosa; Nasopharyngeal  Result Value Ref Range Status   MRSA by PCR NEGATIVE NEGATIVE Final    Comment:        The GeneXpert MRSA Assay (FDA approved for NASAL specimens only), is one component of a comprehensive MRSA colonization surveillance program. It is not intended to diagnose MRSA infection nor to guide or monitor treatment for MRSA infections. Performed at [facility], [place] [address] [place], [place] [address]   Culture, blood (routine x 2)     Status: None   Collection Time: 03/01/19  2:17 PM   Specimen: BLOOD  Result Value Ref Range Status   Specimen Description   Final    BLOOD RIGHT ARM Performed at [facility] [address] [place], [place] [address]    Special Requests   Final    BOTTLES DRAWN AEROBIC ONLY Blood Culture adequate volume Performed at [place] [address] [place], [place] [address]    Culture   Final    NO GROWTH 5 DAYS Performed at [facility], [place] [address], [place], [place] [address]    Report Status 03/06/2019 FINAL  Final  Culture, blood (routine x 2)     Status: None   Collection Time: 03/01/19  2:27 PM   Specimen: BLOOD  Result Value Ref Range Status   Specimen  Description   Final    BLOOD RIGHT HAND Performed at [facility] [place] [address] [place], [place] [address]    Special Requests   Final    BOTTLES DRAWN AEROBIC ONLY Blood Culture adequate volume Performed at [place] [address] [place], [place] [address]    Culture   Final    NO GROWTH 5 DAYS Performed at [facility], [place] [address] [place], [place] [address]    Report Status 03/06/2019 FINAL  Final  Culture, respiratory     Status: None   Collection Time: 03/01/19  3:32 PM   Specimen: Tracheal Aspirate  Result Value Ref Range Status   Specimen Description   Final    TRACHEAL ASPIRATE Performed at [place] [address], [place], [place] [address]    Special Requests   Final    NONE Performed at [facility], [place] [address], [place], [place] [address]    Gram Stain   Final    ABUNDANT WBC PRESENT,BOTH PMN AND MONONUCLEAR MODERATE GRAM NEGATIVE RODS FEW GRAM POSITIVE RODS FEW GRAM POSITIVE COCCI Performed at [facility], [place] [address] [place], [place] [address]    Culture   Final    MODERATE ENTEROBACTER CLOACAE MODERATE PSEUDOMONAS AERUGINOSA    Report Status 03/04/2019 FINAL  Final   Organism ID, Bacteria ENTEROBACTER CLOACAE  Final   Organism ID, Bacteria PSEUDOMONAS AERUGINOSA  Final      Susceptibility   Enterobacter  cloacae - MIC*    CEFAZOLIN >=64 RESISTANT Resistant     CEFEPIME <=1 SENSITIVE Sensitive     CEFTAZIDIME <=1 SENSITIVE Sensitive     CEFTRIAXONE <=1 SENSITIVE Sensitive     CIPROFLOXACIN <=0.25 SENSITIVE Sensitive     GENTAMICIN <=1 SENSITIVE Sensitive     IMIPENEM 0.5 SENSITIVE Sensitive     TRIMETH/SULFA <=20 SENSITIVE Sensitive     PIP/TAZO <=4 SENSITIVE Sensitive     * MODERATE ENTEROBACTER CLOACAE   Pseudomonas aeruginosa - MIC*    CEFTAZIDIME 2 SENSITIVE Sensitive     CIPROFLOXACIN <=0.25 SENSITIVE Sensitive     GENTAMICIN <=1 SENSITIVE Sensitive     IMIPENEM 2 SENSITIVE Sensitive     PIP/TAZO <=4 SENSITIVE Sensitive     CEFEPIME <=1 SENSITIVE Sensitive     * MODERATE PSEUDOMONAS AERUGINOSA  Culture, respiratory     Status: None (Preliminary result)   Collection Time: 03/08/19 11:30 PM   Specimen: Tracheal Aspirate; Respiratory  Result Value Ref Range Status   Specimen Description   Final    TRACHEAL ASPIRATE Performed at [employer] [address], [place], [place] [address]    Special Requests   Final    Normal Performed at [facility], [place] [address] [place], [place] [address]    Gram Stain   Final    RARE WBC PRESENT, PREDOMINANTLY PMN ABUNDANT GRAM NEGATIVE RODS Performed at [facility], [place] [address] [place], [place] [address]    Culture PENDING  Incomplete   Report Status PENDING  Incomplete      Radiology Studies: Dg Chest Port 1 View  Result Date: 03/08/2019 CLINICAL DATA:  Right pneumothorax. EXAM: PORTABLE CHEST 1 VIEW COMPARISON:  03/04/2019.  03/03/2019. FINDINGS: Endotracheal tube, feeding tube, right chest tube in stable position. Cardiac pacer stable position. Heart size stable. Diffuse bilateral pulmonary infiltrates again noted without interim change. No prominent pleural effusion. IMPRESSION: 1. Right chest  tube in stable position. Trace right pneumothorax again noted. 2.  Remaining lines and tubes  stable position. 3. Diffuse bilateral pulmonary infiltrates again noted without interim change. These results will be called to the ordering clinician or representative by the Radiologist Assistant, and communication documented in the PACS or zVision Dashboard. Electronically Signed   By: Marcello Moores  Register   On: 03/08/2019 10:00    Scheduled Meds: . artificial tears  1 application Both Eyes N1U  . chlorhexidine gluconate (MEDLINE KIT)  15 mL Mouth Rinse BID  . Chlorhexidine Gluconate Cloth  6 each Topical Daily  . clonazePAM  2 mg Per Tube Q8H  . enoxaparin (LOVENOX) injection  0.5 mg/kg Subcutaneous BID  . feeding supplement (PRO-STAT SUGAR FREE 64)  60 mL Per Tube TID  . free water  300 mL Per Tube Q6H  . furosemide  40 mg Intravenous Daily  . insulin aspart  0-20 Units Subcutaneous Q4H  . insulin aspart  5 Units Subcutaneous Q4H  . mouth rinse  15 mL Mouth Rinse 10 times per day  . methylPREDNISolone sodium succinate  40 mg Intravenous Q24H  . [START ON 03/10/2019] methylPREDNISolone (SOLU-MEDROL) injection  40 mg Intravenous Daily   Followed by  . [START ON 03/12/2019] methylPREDNISolone (SOLU-MEDROL) injection  20 mg Intravenous Daily   Followed by  . [START ON 03/14/2019] methylPREDNISolone (SOLU-MEDROL) injection  10 mg Intravenous Daily  . multivitamin with minerals  1 tablet Per Tube Daily  . oxyCODONE  10 mg Oral Q6H  . pantoprazole sodium  40 mg Per Tube Daily  . polyethylene glycol  17 g Per Tube BID  . sodium chloride flush  3 mL Intravenous Q12H  . [START ON 03/13/2019] pamrevlumab or placebo  35 mg/kg Intravenous Once  . traZODone  100 mg Oral QHS   Continuous Infusions: . sodium chloride    . dextrose    . feeding supplement (VITAL 1.5 CAL) 45 mL/hr at 03/08/19 0800  . HYDROmorphone 2 mg/hr (03/09/19 0700)  . ketamine (KETALAR) Adult IV Infusion 1 mg/kg/hr (03/09/19 0700)  . norepinephrine (LEVOPHED) Adult infusion    . propofol (DIPRIVAN) infusion 25 mcg/kg/min  (03/09/19 0700)     LOS: 24 days   Time spent: 35 minutes.  Patrecia Pour, MD [facility] [email or web address] Password [facility] 03/09/2019, 9:33 AM

## 2019-03-10 DIAGNOSIS — N179 Acute kidney failure, unspecified: Secondary | ICD-10-CM

## 2019-03-10 LAB — CBC WITH DIFFERENTIAL/PLATELET
Abs Immature Granulocytes: 0.16 10*3/uL — ABNORMAL HIGH (ref 0.00–0.07)
Basophils Absolute: 0 10*3/uL (ref 0.0–0.1)
Basophils Relative: 0 %
Eosinophils Absolute: 0.2 10*3/uL (ref 0.0–0.5)
Eosinophils Relative: 2 %
HCT: 42.4 % (ref 39.0–52.0)
Hemoglobin: 12.3 g/dL — ABNORMAL LOW (ref 13.0–17.0)
Immature Granulocytes: 1 %
Lymphocytes Relative: 4 %
Lymphs Abs: 0.5 10*3/uL — ABNORMAL LOW (ref 0.7–4.0)
MCH: 30.8 pg (ref 26.0–34.0)
MCHC: 29 g/dL — ABNORMAL LOW (ref 30.0–36.0)
MCV: 106.3 fL — ABNORMAL HIGH (ref 80.0–100.0)
Monocytes Absolute: 0.8 10*3/uL (ref 0.1–1.0)
Monocytes Relative: 6 %
Neutro Abs: 10.9 10*3/uL — ABNORMAL HIGH (ref 1.7–7.7)
Neutrophils Relative %: 87 %
Platelets: 150 10*3/uL (ref 150–400)
RBC: 3.99 MIL/uL — ABNORMAL LOW (ref 4.22–5.81)
RDW: 17.8 % — ABNORMAL HIGH (ref 11.5–15.5)
WBC: 12.5 10*3/uL — ABNORMAL HIGH (ref 4.0–10.5)
nRBC: 0.2 % (ref 0.0–0.2)

## 2019-03-10 LAB — POCT I-STAT 7, (LYTES, BLD GAS, ICA,H+H)
Acid-Base Excess: 12 mmol/L — ABNORMAL HIGH (ref 0.0–2.0)
Acid-Base Excess: 13 mmol/L — ABNORMAL HIGH (ref 0.0–2.0)
Bicarbonate: 41.4 mmol/L — ABNORMAL HIGH (ref 20.0–28.0)
Bicarbonate: 43.5 mmol/L — ABNORMAL HIGH (ref 20.0–28.0)
Calcium, Ion: 1.19 mmol/L (ref 1.15–1.40)
Calcium, Ion: 1.19 mmol/L (ref 1.15–1.40)
HCT: 37 % — ABNORMAL LOW (ref 39.0–52.0)
HCT: 39 % (ref 39.0–52.0)
Hemoglobin: 12.6 g/dL — ABNORMAL LOW (ref 13.0–17.0)
Hemoglobin: 13.3 g/dL (ref 13.0–17.0)
O2 Saturation: 88 %
O2 Saturation: 96 %
Patient temperature: 97.9
Patient temperature: 98.5
Potassium: 4.3 mmol/L (ref 3.5–5.1)
Potassium: 4.9 mmol/L (ref 3.5–5.1)
Sodium: 146 mmol/L — ABNORMAL HIGH (ref 135–145)
Sodium: 146 mmol/L — ABNORMAL HIGH (ref 135–145)
TCO2: 44 mmol/L — ABNORMAL HIGH (ref 22–32)
TCO2: 46 mmol/L — ABNORMAL HIGH (ref 22–32)
pCO2 arterial: 78 mmHg (ref 32.0–48.0)
pCO2 arterial: 90.1 mmHg (ref 32.0–48.0)
pH, Arterial: 7.331 — ABNORMAL LOW (ref 7.350–7.450)
pH, Arterial: 7.292 — ABNORMAL LOW (ref 7.350–7.450)
pO2, Arterial: 60 mmHg — ABNORMAL LOW (ref 83.0–108.0)
pO2, Arterial: 98 mmHg (ref 83.0–108.0)

## 2019-03-10 LAB — COMPREHENSIVE METABOLIC PANEL
ALT: 58 U/L — ABNORMAL HIGH (ref 0–44)
AST: 38 U/L (ref 15–41)
Albumin: 2.8 g/dL — ABNORMAL LOW (ref 3.5–5.0)
Alkaline Phosphatase: 60 U/L (ref 38–126)
Anion gap: 8 (ref 5–15)
BUN: 102 mg/dL — ABNORMAL HIGH (ref 8–23)
CO2: 40 mmol/L — ABNORMAL HIGH (ref 22–32)
Calcium: 8.2 mg/dL — ABNORMAL LOW (ref 8.9–10.3)
Chloride: 101 mmol/L (ref 98–111)
Creatinine, Ser: 1.63 mg/dL — ABNORMAL HIGH (ref 0.61–1.24)
GFR calc Af Amer: 46 mL/min — ABNORMAL LOW (ref >60)
GFR calc non Af Amer: 40 mL/min — ABNORMAL LOW (ref >60)
Glucose, Bld: 166 mg/dL — ABNORMAL HIGH (ref 70–99)
Potassium: 5 mmol/L (ref 3.5–5.1)
Sodium: 149 mmol/L — ABNORMAL HIGH (ref 135–145)
Total Bilirubin: 0.5 mg/dL (ref 0.3–1.2)
Total Protein: 5 g/dL — ABNORMAL LOW (ref 6.5–8.1)

## 2019-03-10 LAB — GLUCOSE, CAPILLARY
Glucose-Capillary: 157 mg/dL — ABNORMAL HIGH (ref 70–99)
Glucose-Capillary: 149 mg/dL — ABNORMAL HIGH (ref 70–99)
Glucose-Capillary: 121 mg/dL — ABNORMAL HIGH (ref 70–99)
Glucose-Capillary: 169 mg/dL — ABNORMAL HIGH (ref 70–99)
Glucose-Capillary: 187 mg/dL — ABNORMAL HIGH (ref 70–99)

## 2019-03-10 LAB — TRIGLYCERIDES: Triglycerides: 360 mg/dL — ABNORMAL HIGH (ref <150)

## 2019-03-10 LAB — PROTIME-INR
INR: 1 (ref 0.8–1.2)
Prothrombin Time: 13 seconds (ref 11.4–15.2)

## 2019-03-10 LAB — MAGNESIUM: Magnesium: 2.6 mg/dL — ABNORMAL HIGH (ref 1.7–2.4)

## 2019-03-10 MED ORDER — PIPERACILLIN-TAZOBACTAM 3.375 G IVPB 30 MIN
3.3750 g | Freq: Once | INTRAVENOUS | Status: AC
  Administered 2019-03-10: 14:00:00 3.375 g via INTRAVENOUS
  Filled 2019-03-10: qty 50

## 2019-03-10 MED ORDER — METOPROLOL TARTRATE 5 MG/5ML IV SOLN
5.0000 mg | Freq: Once | INTRAVENOUS | Status: AC
  Administered 2019-03-10: 5 mg via INTRAVENOUS
  Filled 2019-03-10: qty 5

## 2019-03-10 MED ORDER — PIPERACILLIN-TAZOBACTAM 3.375 G IVPB
3.3750 g | Freq: Three times a day (TID) | INTRAVENOUS | Status: DC
  Administered 2019-03-10 – 2019-03-16 (×17): 3.375 g via INTRAVENOUS
  Filled 2019-03-10 (×19): qty 50

## 2019-03-10 NOTE — Progress Notes (Signed)
Pt's wife, Enid Derry, called and updated on Mr. Labrada condition. Questions answered, and Ms. Staup expressed her thanks for all the care her husband is receiving at [facility].

## 2019-03-10 NOTE — Progress Notes (Signed)
RT NOTE:  Critical ABG called to [facility]  PH: 7.23 CO2: >97 PO2: 4   ELINK RN will report to MD. RT awaiting further instruction.

## 2019-03-10 NOTE — Progress Notes (Signed)
PROGRESS NOTE  Clayton Sawyer  MBT:[id number] DOB: Dec 22, 1940 DOA: 02/07/2019 PCP: Jodi Marble, MD   Brief Narrative: Clayton Sawyer is a 78 y.o. male with a history of HTN, CAD s/p PCI, PPM, and prostate CA who presented to [facility] with shortness of breath and subsequently admitted to [facility] on 02/14/2019 due to hypoxemic respiratory failure due to covid-19 infection, requiring heated HFNC in the ICU. Steroids were started, remdesivir given for 5 days, and tocilizumab given x2. Inflammatory markers improved, and hypoxemia remained stable. He was transferred out of the ICU 7/23 but subsequently decompensated. After extensive goals of care discussions with the patient, full code status was reinstated and he was transferred back to the ICU requiring intubation and chest tube placement for pneumothorax on 7/28.  With rising leukocytosis, sputum culture was sent and grew pseudomonas and enterobacter which were treated with vancomycin and cefepime initially, now completing a course of cefepime alone based on susceptibility data. Steroids have continued, though the patient has made no clinical progress in the past week. Goals of care discussions were continued, though the family continues to wish for full measures.  Assessment & Plan: Principal Problem:   Acute respiratory disease due to COVID-19 virus Active Problems:   Presence of cardiac pacemaker   History of stroke   Essential (primary) hypertension   Atherosclerotic heart disease of native coronary artery without angina pectoris/PCI stents 09/2004, 03/2009 and 10/2011   COVID-19 virus infection   Dyspnea and respiratory abnormalities   Obesity (BMI 30-39.9)   Anxiety disorder   Thrombocytopenia (HCC)   Pressure injury of skin   Acute respiratory failure with hypoxia (HCC)   Goals of care, counseling/discussion   Palliative care by specialist  Acute hypoxic respiratory failure due to Covid-19 infection: s/p tocilizumab x2, remdesivir x5 days.   - Continue IV steroid taper, inflammatory markers normalized.   - Also was enrolled in fibrogen study at admission, next infusion scheduled at 8/12. Granddaughter has inquired about RLF-100 which received IND designation and is undergoing clinical trial. This trial is not available currently at this center, and having already experienced respiratory failure, would not meet inclusion criteria. I also suspect having already enrolled in this trial may preclude enrollment in another.  - Vitamin C and zinc  - Continue airborne, contact precautions. - Prone 16hrs/day as able, intermittent paralytic. This appears to have helped P:F - Continue ventilator settings and sedation per CCM. Oxygenation continues to be difficult with high FiO2/PEEP requirements and high pressures.  - Keep net negative balance with lasix, strict I/O. Follow metabolic panel. - Continue goals of care discussions pending clinical trajectory. Hopeful that proning is tolerated and helpful with oxygenation.  Hypercarbia: Per PCCM.   Septic shock due to HCAP due to pseudomonas and enterobacter:  - Completed 7 days of cefepime per susceptibility data (started 7/31), also received vancomycin x2 days. - Blood cultures drawn 7/31, negative. Initially had contaminants in blood cultures at admission. - Weaned off pressors.   - Leukocytosis returned 8/7; repeat tracheal aspirate again growing abundant Pseudomonas. Agree with zosyn. Needs vigilant monitoring having received tocilizumab.  - Repeat blood and respiratory cultures sent 8/7, follow up respiratory susceptibilities and blood culture results.  Right pneumothorax: s/p chest tube 7/28.  - Management per PCCM, remain to suction at [place] until extubated. Will monitor with intermittent CXR, stable per my personal review on 8/7.  Acute renal failure: - Recurrent with attempts at diuresis. Will hold lasix for now. If oliguric today, can  attempt repeat dosing, would favor waiting.    SVT: Transient, responsive to metoprolol 15m IV x1 on 8/8.  - K is 5, check Mg.  - Continue monitoring  Hyperkalemia: Resolved.   LFT elevation: Due to viral infection.  - Continue to monitor  Steroid-induced hyperglycemia:  - Augmented insulin with 5u + SSI resistant scale q4h. Will add 1 additional unit q4h to maintain tight control. With taper of steroids, will need close monitoring to avoid hypoglycemia.  Hypernatremia:  - Continue free water per tube and monitoring Na.   Thrombocytopenia: Unclear etiology. - HIT panel negative, changed bivalirudin back to lovenox. Continue to monitor for bleeding.   Stage I deep tissue injury: Not POA. Noted 8/1.   CAD s/p PCI: Stable.   Anxiety:  - Continue enteral and IV agents as ordered.  Prostate CA: Quiescent.   Obesity: BMI 36.  - Poor prognostic indicator.  - Continue tube feeding with SSI coverage to optimize glycemic control.  DVT prophylaxis: SCDs, bivalirudin Code Status: DNR Family Communication: Discussing with granddaughter and wife again today. Had joint phone call w/PCCM, granddaughter and wife last 8/8. Disposition Plan: Uncertain, poor prognosis.  Consultants:   PCCM  Palliative care team  Procedures:   Intubation 7/28 >>  Right chest tube 7/28 >>  Antimicrobials:  Remdesivir  Vancomycin  Cefepime   Subjective: No new fevers, vitals stable. P:F better with proning.  Objective: Vitals:   03/10/19 0300 03/10/19 0400 03/10/19 0500 03/10/19 0600  BP: (!) 121/49 (!) 102/50 (!) 122/52 (!) 89/50  Pulse: 80 71 87 75  Resp: (!) 32 (!) 32 (!) 32 (!) 32  Temp:  98.2 F (36.8 C)    TempSrc:  Axillary    SpO2: 92% 93% 95% 96%  Weight:      Height:        Intake/Output Summary (Last 24 hours) at 03/10/2019 0657 Last data filed at 03/10/2019 0600 Gross per 24 hour  Intake 3382.33 ml  Output 2230 ml  Net 1152.33 ml   Filed Weights   03/06/19 0500 03/07/19 0254 03/09/19 0500  Weight: 111.4 kg 113.4  kg 108.8 kg   Gen: 78y.o. male in no distress Pulm: Vent-supported breathing. Improved crackles bilaterally. CV: Regular rate and rhythm. No murmur, rub, or gallop. No JVD, no dependent edema. GI: Abdomen soft, non-tender, non-distended, with normoactive bowel sounds.  Ext: Warm, no deformities Skin: No rashes, lesions or ulcers on visualized skin. Chest tube site c/d/i Neuro: Sedated Psych: UTD  Data Reviewed: I have personally reviewed following labs and imaging studies  CBC: Recent Labs  Lab 03/06/19 0400 03/07/19 0450 03/08/19 0443 03/09/19 0446 03/09/19 0754 03/09/19 1847 03/10/19 0500  WBC 10.4 8.8 14.3* 8.5  --   --  12.5*  NEUTROABS  --  8.1* 13.4* 6.5  --   --  10.9*  HGB 12.6* 11.6* 13.4 11.2* 11.9* 14.3 12.3*  HCT 42.9 40.0 45.7 36.9* 35.0* 42.0 42.4  MCV 103.4* 104.4* 104.3* 105.4*  --   --  106.3*  PLT 85* 78* 125* 107*  --   --  1004  Basic Metabolic Panel: Recent Labs  Lab 03/06/19 0400 03/07/19 0450 03/08/19 0443 03/09/19 0446 03/09/19 0754 03/09/19 1847 03/10/19 0500  NA 150* 149* 147* 145 145 143 149*  K 5.2* 4.8 4.8 4.2 4.2 5.4* 5.0  CL 111 107 99 99  --   --  101  CO2 37* 35* 38* 38*  --   --  40*  GLUCOSE 207* 199* 247* 138*  --   --  166*  BUN 56* 54* 67* 75*  --   --  PENDING  CREATININE 0.91 0.77 1.01 1.08  --   --  1.63*  CALCIUM 8.2* 7.8* 8.6* 7.8*  --   --  8.2*   GFR: Estimated Creatinine Clearance: 45.4 mL/min (A) (by C-G formula based on SCr of 1.63 mg/dL (H)). Liver Function Tests: Recent Labs  Lab 03/06/19 0400 03/07/19 0450 03/08/19 0443 03/09/19 0446 03/10/19 0500  AST 31 31 39 31 38  ALT 45* 49* 59* 49* 58*  ALKPHOS 50 42 55 43 60  BILITOT 0.7 0.9 1.0 0.7 0.5  PROT 4.7* 4.4* 5.4* 4.3* 5.0*  ALBUMIN 2.4* 2.2* 2.7* 2.3* 2.8*   No results for input(s): LIPASE, AMYLASE in the last 168 hours. No results for input(s): AMMONIA in the last 168 hours. Coagulation Profile: Recent Labs  Lab 03/06/19 0400 03/07/19 0450  03/08/19 0443 03/09/19 0446 03/10/19 0500  INR 1.5* 1.1 1.1 1.1 1.0   Cardiac Enzymes: No results for input(s): CKTOTAL, CKMB, CKMBINDEX, TROPONINI in the last 168 hours. BNP (last 3 results) No results for input(s): PROBNP in the last 8760 hours. HbA1C: No results for input(s): HGBA1C in the last 72 hours. CBG: Recent Labs  Lab 03/09/19 1147 03/09/19 1608 03/09/19 2101 03/09/19 2302 03/10/19 0319  GLUCAP 199* 200* 206* 192* 157*   Lipid Profile: Recent Labs    03/07/19 1250  TRIG 571*   Thyroid Function Tests: No results for input(s): TSH, T4TOTAL, FREET4, T3FREE, THYROIDAB in the last 72 hours. Anemia Panel: No results for input(s): VITAMINB12, FOLATE, FERRITIN, TIBC, IRON, RETICCTPCT in the last 72 hours. Urine analysis:    Component Value Date/Time   COLORURINE YELLOW 03/01/2019 1530   APPEARANCEUR HAZY (A) 03/01/2019 1530   APPEARANCEUR Clear 04/18/2013 2216   LABSPEC 1.024 03/01/2019 1530   LABSPEC 1.025 04/18/2013 2216   PHURINE 5.0 03/01/2019 1530   GLUCOSEU NEGATIVE 03/01/2019 1530   GLUCOSEU Negative 04/18/2013 2216   HGBUR SMALL (A) 03/01/2019 1530   BILIRUBINUR NEGATIVE 03/01/2019 1530   BILIRUBINUR Negative 04/18/2013 2216   KETONESUR NEGATIVE 03/01/2019 1530   PROTEINUR NEGATIVE 03/01/2019 1530   NITRITE POSITIVE (A) 03/01/2019 1530   LEUKOCYTESUR MODERATE (A) 03/01/2019 1530   LEUKOCYTESUR Negative 04/18/2013 2216   Recent Results (from the past 240 hour(s))  MRSA PCR Screening     Status: None   Collection Time: 03/01/19  1:18 PM   Specimen: Nasal Mucosa; Nasopharyngeal  Result Value Ref Range Status   MRSA by PCR NEGATIVE NEGATIVE Final    Comment:        The GeneXpert MRSA Assay (FDA approved for NASAL specimens only), is one component of a comprehensive MRSA colonization surveillance program. It is not intended to diagnose MRSA infection nor to guide or monitor treatment for MRSA infections. Performed at [facility], [place] [address] [place], [place] [address]   Culture, blood (routine x 2)     Status: None   Collection Time: 03/01/19  2:17 PM   Specimen: BLOOD  Result Value Ref Range Status   Specimen Description   Final    BLOOD RIGHT ARM Performed at [place] [address] [place], [place] [address]    Special Requests   Final    BOTTLES DRAWN AEROBIC ONLY Blood Culture adequate volume Performed at [facility] [address], [place],  [address]    Culture  Final    NO GROWTH 5 DAYS Performed at [facility], [place] [address], [place], [place] [address]    Report Status 03/06/2019 FINAL  Final  Culture, blood (routine x 2)     Status: None   Collection Time: 03/01/19  2:27 PM   Specimen: BLOOD  Result Value Ref Range Status   Specimen Description   Final    BLOOD RIGHT HAND Performed at [facility] [address] [place], [place] [address]    Special Requests   Final    BOTTLES DRAWN AEROBIC ONLY Blood Culture adequate volume Performed at [facility] [address], [place], [place] [address]    Culture   Final    NO GROWTH 5 DAYS Performed at [facility], [place] [address], [place], [place] [address]    Report Status 03/06/2019 FINAL  Final  Culture, respiratory     Status: None   Collection Time: 03/01/19  3:32 PM   Specimen: Tracheal Aspirate  Result Value Ref Range Status   Specimen Description   Final    TRACHEAL ASPIRATE Performed at [facility] [address] [place], [place] [address]    Special Requests   Final    NONE Performed at [facility], [place] [address], [place], [place] [address]    Gram Stain   Final    ABUNDANT WBC PRESENT,BOTH PMN AND MONONUCLEAR MODERATE GRAM NEGATIVE RODS FEW GRAM POSITIVE RODS FEW GRAM POSITIVE COCCI Performed at [facility], [place] [address] [place], [place] [address]     Culture   Final    MODERATE ENTEROBACTER CLOACAE MODERATE PSEUDOMONAS AERUGINOSA    Report Status 03/04/2019 FINAL  Final   Organism ID, Bacteria ENTEROBACTER CLOACAE  Final   Organism ID, Bacteria PSEUDOMONAS AERUGINOSA  Final      Susceptibility   Enterobacter cloacae - MIC*    CEFAZOLIN >=64 RESISTANT Resistant     CEFEPIME <=1 SENSITIVE Sensitive     CEFTAZIDIME <=1 SENSITIVE Sensitive     CEFTRIAXONE <=1 SENSITIVE Sensitive     CIPROFLOXACIN <=0.25 SENSITIVE Sensitive     GENTAMICIN <=1 SENSITIVE Sensitive     IMIPENEM 0.5 SENSITIVE Sensitive     TRIMETH/SULFA <=20 SENSITIVE Sensitive     PIP/TAZO <=4 SENSITIVE Sensitive     * MODERATE ENTEROBACTER CLOACAE   Pseudomonas aeruginosa - MIC*    CEFTAZIDIME 2 SENSITIVE Sensitive     CIPROFLOXACIN <=0.25 SENSITIVE Sensitive     GENTAMICIN <=1 SENSITIVE Sensitive     IMIPENEM 2 SENSITIVE Sensitive     PIP/TAZO <=4 SENSITIVE Sensitive     CEFEPIME <=1 SENSITIVE Sensitive     * MODERATE PSEUDOMONAS AERUGINOSA  Culture, respiratory     Status: None (Preliminary result)   Collection Time: 03/08/19 11:30 PM   Specimen: Tracheal Aspirate; Respiratory  Result Value Ref Range Status   Specimen Description   Final    TRACHEAL ASPIRATE Performed at [facility] [address] [place], [place] [address]    Special Requests   Final    Normal Performed at [facility], [place] [address] [place], [place] [address]    Gram Stain   Final    RARE WBC PRESENT, PREDOMINANTLY PMN ABUNDANT GRAM NEGATIVE RODS Performed at [facility], [place] [address], [place], [place] [address]    Culture PENDING  Incomplete   Report Status PENDING  Incomplete  Radiology Studies: Dg Chest Port 1 View  Result Date: 03/08/2019 CLINICAL DATA:  Right pneumothorax. EXAM: PORTABLE CHEST 1 VIEW COMPARISON:  03/04/2019.  03/03/2019. FINDINGS: Endotracheal tube, feeding tube, right chest tube in stable position.  Cardiac pacer stable position. Heart size stable. Diffuse bilateral pulmonary infiltrates again noted without interim change. No prominent pleural effusion. IMPRESSION: 1. Right chest tube in stable position. Trace right pneumothorax again noted. 2.  Remaining lines and tubes stable position. 3. Diffuse bilateral pulmonary infiltrates again noted without interim change. These results will be called to the ordering clinician or representative by the Radiologist Assistant, and communication documented in the PACS or zVision Dashboard. Electronically Signed   By: Marcello Moores  Register   On: 03/08/2019 10:00    Scheduled Meds: . artificial tears  1 application Both Eyes Y8A  . chlorhexidine gluconate (MEDLINE KIT)  15 mL Mouth Rinse BID  . Chlorhexidine Gluconate Cloth  6 each Topical Daily  . clonazePAM  2 mg Per Tube Q8H  . enoxaparin (LOVENOX) injection  0.5 mg/kg Subcutaneous BID  . feeding supplement (PRO-STAT SUGAR FREE 64)  60 mL Per Tube TID  . free water  300 mL Per Tube Q6H  . insulin aspart  0-20 Units Subcutaneous Q4H  . insulin aspart  6 Units Subcutaneous Q4H  . mouth rinse  15 mL Mouth Rinse 10 times per day  . methylPREDNISolone sodium succinate  40 mg Intravenous Q24H  . methylPREDNISolone (SOLU-MEDROL) injection  40 mg Intravenous Daily   Followed by  . [START ON 03/12/2019] methylPREDNISolone (SOLU-MEDROL) injection  20 mg Intravenous Daily   Followed by  . [START ON 03/14/2019] methylPREDNISolone (SOLU-MEDROL) injection  10 mg Intravenous Daily  . multivitamin with minerals  1 tablet Per Tube Daily  . oxyCODONE  10 mg Oral Q6H  . pantoprazole sodium  40 mg Per Tube Daily  . polyethylene glycol  17 g Per Tube BID  . sodium chloride flush  3 mL Intravenous Q12H  . [START ON 03/13/2019] pamrevlumab or placebo  35 mg/kg Intravenous Once  . traZODone  100 mg Oral QHS   Continuous Infusions: . sodium chloride    . sodium chloride    . dextrose    . feeding supplement (VITAL 1.5 CAL)  1,000 mL (03/09/19 1630)  . HYDROmorphone 2 mg/hr (03/10/19 0600)  . ketamine (KETALAR) Adult IV Infusion 1 mg/kg/hr (03/10/19 0600)  . norepinephrine (LEVOPHED) Adult infusion    . propofol (DIPRIVAN) infusion 35 mcg/kg/min (03/10/19 0654)     LOS: 25 days   Time spent: 35 minutes.  Patrecia Pour, MD [facility] [email or web address] Password [facility] 03/10/2019, 6:57 AM

## 2019-03-10 NOTE — Progress Notes (Signed)
RT NOTE:  RT assisted with turning patient supine. ETT secure throughout.

## 2019-03-10 NOTE — Progress Notes (Signed)
RT NOTE:  Pt in prone position. RT assistedRN with head turn to the left. ETT secured throughout @ 26/lip

## 2019-03-10 NOTE — Progress Notes (Signed)
Pt's granddaughter Apolonio Schneiders called and updated on Mr. Farmers condition. Questions answered and prayed with family over the phone.

## 2019-03-10 NOTE — Progress Notes (Signed)
**Note De-identified  Obfuscation** Critical ABG results reported to MD 

## 2019-03-10 NOTE — Progress Notes (Signed)
NAME:  Clayton Sawyer, MRN:  [id number], DOB:  1941/05/23, LOS: 45 ADMISSION DATE:  02/14/2019, CONSULTATION DATE: February 13, 2019 REFERRING MD: Dr. 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 Hyperlipidemia History of prostate cancer Obstructive sleep apnea  Significant Hospital Events   7/15 Admit, transfer to [facility] 7/16 enrolled in fibrinogen research drug  7/27 PCCM consulted again for worsening oxygenation, pneumothorax on right 7/28 transferred to the intensive care unit, full code, intubated, right-sided chest tube 7/31 worsening vent settings, severe hypoxemia, high sedation needs 8/2 Worsening ventilator dyssynchrony overnight when attempted to stop Precedex, started bivalrudin 8/4 made DNR, worsening oxygen needs, ventilator synchrony 8/5 family requested transfer to [facility], [place] made call, the did not accept 8/6 palliative care consult  Consults:  Pulmonary and critical care medicine  Procedures:  ETT 7/28 CVC 7/28 Pigtail R 7/28  Significant Diagnostic Tests:    Micro Data:  7/10, 7/15 SARS-CoV-2 positive 7/16 blood > coag neg staph in 1/4 7/31 blood >  7/31 resp > enterobacter, pseudomonas 8/7 resp > abundant pseudomonas, pending final  Antimicrobials/COVID treatment  Ceftriaxone July 14 Azithromycin July 15 Remdesivir July 15 > July 19 Actemra July 15, July 19 Vancomycin July 18, July 19 Solumedrol July 14 Pamrevlumab vs placebo, next scheduled infusion July 29 Cefepime 7/31>   Interim history/subjective:   Returned to supine. Improved P:F ratio when proned.  Objective   Blood pressure (!) 96/51, pulse 71, temperature 97.9 F (36.6 C), temperature source Oral, resp. rate (!) 32, height 5\' 9"  (1.753 m), weight 108.8 kg, SpO2 97 %.    Vent  Mode: PRVC FiO2 (%):  [65 %-100 %] 70 % Set Rate:  [30 bmp-32 bmp] 32 bmp Vt Set:  [560 mL] 560 mL PEEP:  [12 cmH20] 12 cmH20 Plateau Pressure:  [32 cmH20-38 cmH20] 32 cmH20   Intake/Output Summary (Last 24 hours) at 03/10/2019 1101 Last data filed at 03/10/2019 1000 Gross per 24 hour  Intake 3673.35 ml  Output 2055 ml  Net 1618.35 ml   Filed Weights   03/06/19 0500 03/07/19 0254 03/09/19 0500  Weight: 111.4 kg 113.4 kg 108.8 kg   Examination: General:  In bed on vent HENT: NCAT ETT in place PULM: Diminished breath sounds bilaterally, vent supported breathing, right chest tube in place without evidence of airleak CV: RRR, no mgr GI: BS+, soft, nontender Extremities: 1+ pitting edema in lower extremities MSK: normal bulk and tone Neuro: sedated on vent  Resolved Hospital Problem list     Assessment & Plan:  ARDS due to COVID-19 pneumonia: C/b PTX and HCAP. Vent Day 12.  Plan Continue mechanical ventilation per ARDS protocol Target TVol 6-8cc/kgIBW Target Plateau Pressure < 30cm H20 Target driving pressure less than 15 cm of water Target PaO2 55-65: titrate PEEP/FiO2 per protocol As long as PaO2 to FiO2 ratio is less than 1:150 position in prone position for 16 hours a day Check CVP daily if CVL in place Target CVP less than 4, diurese as necessary Ventilator associated pneumonia prevention protocol Proning protocol with intermittent paralytics 8/9: Hold on diuresis this morning. Consider lasix dose in afternoon pending UOP. ABG reviewed. Plan to re-prone today  Right sided pneumothorax status post 14 French pigtail chest tube catheter Continue chest tube to suction until extubated  HCAP: pseudomonas and enterobacter:  Repeat culture with abundant PA: favor active infection vs colonization in setting of worsening leukocytosis and persistent hypoxemia with S/p Cefepime x 7 days Start Zosyn Follow-up final trach aspirate and blood cultures  Agitation requiring sedation:  episodes of dyssynchrony, fentanyl tachyphylaxis? Continue kentamine, dilaudid and propofol for goal RASS -4 or -5 Intermittent paralytic protocol for proning Continue oxycodone, clonazepam  Thromboctytopenia: not HITT, improving Monitor for bleeding Continue lovenox 0.5mg  sq bid Taper solumedrol  GOC:  Palliative Care consulted. Family requested transfer to another facility which was attempted on 8/5 however request for transfer was denied. Family wishes to remain aggressive including pursuing tracheostomy.  Reasonable to pursue trial of proning for single organ injury though his status remains critical in setting of prolonged illness. Prognosis remains guarded.  Best practice:  Diet: tube feeding Pain/Anxiety/Delirium protocol (if indicated): yes, see above VAP protocol (if indicated): yes DVT prophylaxis: SCD GI prophylaxis: Pantoprazole for stress ulcer prophylaxis Glucose control: per TRH Mobility: bed rest Code Status: DNR Family Communication: per TRH.  Disposition: remain in ICU  Labs   CBC: Recent Labs  Lab 03/06/19 0400 03/07/19 0450 03/08/19 0443 03/09/19 0446 03/09/19 0754 03/09/19 1847 03/10/19 0500 03/10/19 1031  WBC 10.4 8.8 14.3* 8.5  --   --  12.5*  --   NEUTROABS  --  8.1* 13.4* 6.5  --   --  10.9*  --   HGB 12.6* 11.6* 13.4 11.2* 11.9* 14.3 12.3* 12.6*  HCT 42.9 40.0 45.7 36.9* 35.0* 42.0 42.4 37.0*  MCV 103.4* 104.4* 104.3* 105.4*  --   --  106.3*  --   PLT 85* 78* 125* 107*  --   --  150  --     Basic Metabolic Panel: Recent Labs  Lab 03/06/19 0400 03/07/19 0450 03/08/19 0443 03/09/19 0446 03/09/19 0754 03/09/19 1847 03/10/19 0500 03/10/19 0703 03/10/19 1031  NA 150* 149* 147* 145 145 143 149*  --  146*  K 5.2* 4.8 4.8 4.2 4.2 5.4* 5.0  --  4.3  CL 111 107 99 99  --   --  101  --   --   CO2 37* 35* 38* 38*  --   --  40*  --   --   GLUCOSE 207* 199* 247* 138*  --   --  166*  --   --   BUN 56* 54* 67* 75*  --   --  102*  --   --    CREATININE 0.91 0.77 1.01 1.08  --   --  1.63*  --   --   CALCIUM 8.2* 7.8* 8.6* 7.8*  --   --  8.2*  --   --   MG  --   --   --   --   --   --   --  2.6*  --    GFR: Estimated Creatinine Clearance: 45.4 mL/min (A) (by C-G formula based on SCr of 1.63 mg/dL (H)). Recent Labs  Lab 03/07/19 0450 03/08/19 0443 03/09/19 0446 03/10/19 0500  WBC 8.8 14.3* 8.5 12.5*    Liver Function Tests: Recent Labs  Lab 03/06/19 0400 03/07/19 0450 03/08/19 0443 03/09/19 0446 03/10/19 0500  AST 31 31 39 31 38  ALT 45* 49* 59* 49* 58*  ALKPHOS 50 42 55 43 60  BILITOT 0.7 0.9 1.0 0.7 0.5  PROT 4.7* 4.4* 5.4* 4.3* 5.0*  ALBUMIN 2.4* 2.2* 2.7* 2.3* 2.8*   No results for input(s): LIPASE, AMYLASE in the last 168 hours. No  results for input(s): AMMONIA in the last 168 hours.  ABG    Component Value Date/Time   PHART 7.331 (L) 03/10/2019 1031   PCO2ART 78.0 (HH) 03/10/2019 1031   PO2ART 60.0 (L) 03/10/2019 1031   HCO3 41.4 (H) 03/10/2019 1031   TCO2 44 (H) 03/10/2019 1031   O2SAT 88.0 03/10/2019 1031     Coagulation Profile: Recent Labs  Lab 03/06/19 0400 03/07/19 0450 03/08/19 0443 03/09/19 0446 03/10/19 0500  INR 1.5* 1.1 1.1 1.1 1.0    Cardiac Enzymes: No results for input(s): CKTOTAL, CKMB, CKMBINDEX, TROPONINI in the last 168 hours.  HbA1C: Hemoglobin A1C  Date/Time Value Ref Range Status  09/22/2012 02:49 AM 5.9 4.2 - 6.3 % Final    Comment:    The American Diabetes Association recommends that a primary goal of therapy should be <7% and that physicians should reevaluate the treatment regimen in patients with HbA1c values consistently >8%.     CBG: Recent Labs  Lab 03/09/19 1147 03/09/19 1608 03/09/19 2101 03/09/19 2302 03/10/19 0319  GLUCAP 199* 200* 206* 192* 157*     Critical care time: 33 minutes    The patient is critically ill with multiple organ systems failure and requires high complexity decision making for assessment and support, frequent  evaluation and titration of therapies, application of advanced monitoring technologies and extensive interpretation of multiple databases.   Discussed and co-managed patient care with PCCM-Hospitalist. Coordinated care with RT, RN and pharmacist.  Rodman Pickle, M.D. [facility] Pulmonary/Critical Care Medicine 03/10/2019 11:02 AM  Pager: [phone] After hours pager: [phone]

## 2019-03-10 NOTE — Progress Notes (Signed)
**Note De-Identified  Obfuscation** Critical ABG called to MD(secure chat)

## 2019-03-10 NOTE — Progress Notes (Signed)
Pts granddaughter Apolonio Schneiders called and was updated. All questions were answered. Apolonio Schneiders was placed on speaker phone to speak to patient and say a prayer.

## 2019-03-10 NOTE — Progress Notes (Signed)
Pt turned to prone position at 1300, per MD order, with assistance from RT and 3 other RNs. All lines and tubes intact and patent. Pt tolerated turn well and VSS. Will continue to monitor closely.  Will remain prone until 8/10@0500  per proning protocol, unless contraindicated.

## 2019-03-10 NOTE — Progress Notes (Addendum)
Pharmacy Antibiotic Note  Clayton Sawyer is a 78 y.o. male admitted on 02/22/2019 with recurrent pseudomonas pnuemonia.  Pharmacy has been consulted for Zosyn dosing. WBC mildly elevated. Afebrile. SCr has trended up acutely to 1.63  Plan: -Start Zosyn 3.375 gm IV Q 8 hours -Monitor renal fx, cultures and clinical progress   Height: 5\' 9"  (175.3 cm) Weight: 239 lb 13.8 oz (108.8 kg) IBW/kg (Calculated) : 70.7  Temp (24hrs), Avg:98.5 F (36.9 C), Min:97.9 F (36.6 C), Max:99.3 F (37.4 C)  Recent Labs  Lab 03/06/19 0400 03/07/19 0450 03/08/19 0443 03/09/19 0446 03/10/19 0500  WBC 10.4 8.8 14.3* 8.5 12.5*  CREATININE 0.91 0.77 1.01 1.08 1.63*    Estimated Creatinine Clearance: 45.4 mL/min (A) (by C-G formula based on SCr of 1.63 mg/dL (H)).    Allergies  Allergen Reactions  . Lisinopril Cough  . Losartan Rash  . Statins Other (See Comments)    Body aches    Antimicrobials this admission: 7/15 Azith/CTX x1 ([facility]) 7/15 Remdesivir >> 7/19 7/15 Actemra x1 ([facility]) 7/18 Vanc >> 7/19, resumed 7/31 >> 8/1 7/19 Actemra x 1 7/31 Cefepime >> 8/6 8/9 Zosyn >>   Dose adjustments this admission:   Microbiology results: 7/15 SARS Coronavirus 2: positive 7/15 BCx [facility]): 1/4 bottles CONS +mec A - likely contaminant 7/16 BCx: 1/4 bottles staph hominis (likely contaminant again) 7/15 MRSA PCR: neg 7/31 MRSA PCR:  negative 7/31 BCx: ngtd 7/31 TA: culture: Enterobacter cloacae (pan S except R Ancef); pseudomonas aeruginosa 8/7 TA >> abundant pseudomonas  8/7 BCx >> ngtd    Thank you for allowing pharmacy to be a part of this patient's care.  Albertina Parr, PharmD., BCPS Clinical Pharmacist Clinical phone for 03/10/19 until 5pm: [phone]

## 2019-03-10 NOTE — Progress Notes (Signed)
RT NOTE:  Pt in prone position. RT assisted RN with head turn to the left. ETT secured throughout @ 26/lip

## 2019-03-11 LAB — COMPREHENSIVE METABOLIC PANEL
ALT: 53 U/L — ABNORMAL HIGH (ref 0–44)
AST: 39 U/L (ref 15–41)
Albumin: 2.6 g/dL — ABNORMAL LOW (ref 3.5–5.0)
Alkaline Phosphatase: 68 U/L (ref 38–126)
Anion gap: 8 (ref 5–15)
BUN: 99 mg/dL — ABNORMAL HIGH (ref 8–23)
CO2: 39 mmol/L — ABNORMAL HIGH (ref 22–32)
Calcium: 8.3 mg/dL — ABNORMAL LOW (ref 8.9–10.3)
Chloride: 102 mmol/L (ref 98–111)
Creatinine, Ser: 1.33 mg/dL — ABNORMAL HIGH (ref 0.61–1.24)
GFR calc Af Amer: 59 mL/min — ABNORMAL LOW (ref >60)
GFR calc non Af Amer: 51 mL/min — ABNORMAL LOW (ref >60)
Glucose, Bld: 161 mg/dL — ABNORMAL HIGH (ref 70–99)
Potassium: 4.7 mmol/L (ref 3.5–5.1)
Sodium: 149 mmol/L — ABNORMAL HIGH (ref 135–145)
Total Bilirubin: 0.7 mg/dL (ref 0.3–1.2)
Total Protein: 5.3 g/dL — ABNORMAL LOW (ref 6.5–8.1)

## 2019-03-11 LAB — POCT I-STAT 7, (LYTES, BLD GAS, ICA,H+H)
Acid-Base Excess: 14 mmol/L — ABNORMAL HIGH (ref 0.0–2.0)
Bicarbonate: 43.2 mmol/L — ABNORMAL HIGH (ref 20.0–28.0)
Calcium, Ion: 1.2 mmol/L (ref 1.15–1.40)
HCT: 38 % — ABNORMAL LOW (ref 39.0–52.0)
Hemoglobin: 12.9 g/dL — ABNORMAL LOW (ref 13.0–17.0)
O2 Saturation: 87 %
Patient temperature: 99.6
Potassium: 4.3 mmol/L (ref 3.5–5.1)
Sodium: 146 mmol/L — ABNORMAL HIGH (ref 135–145)
TCO2: 46 mmol/L — ABNORMAL HIGH (ref 22–32)
pCO2 arterial: 82.3 mmHg (ref 32.0–48.0)
pH, Arterial: 7.331 — ABNORMAL LOW (ref 7.350–7.450)
pO2, Arterial: 63 mmHg — ABNORMAL LOW (ref 83.0–108.0)

## 2019-03-11 LAB — CULTURE, RESPIRATORY W GRAM STAIN: Special Requests: NORMAL

## 2019-03-11 LAB — GLUCOSE, CAPILLARY
Glucose-Capillary: 129 mg/dL — ABNORMAL HIGH (ref 70–99)
Glucose-Capillary: 116 mg/dL — ABNORMAL HIGH (ref 70–99)
Glucose-Capillary: 158 mg/dL — ABNORMAL HIGH (ref 70–99)
Glucose-Capillary: 166 mg/dL — ABNORMAL HIGH (ref 70–99)
Glucose-Capillary: 140 mg/dL — ABNORMAL HIGH (ref 70–99)
Glucose-Capillary: 115 mg/dL — ABNORMAL HIGH (ref 70–99)

## 2019-03-11 LAB — CBC WITH DIFFERENTIAL/PLATELET
Abs Immature Granulocytes: 0.12 10*3/uL — ABNORMAL HIGH (ref 0.00–0.07)
Basophils Absolute: 0 10*3/uL (ref 0.0–0.1)
Basophils Relative: 0 %
Eosinophils Absolute: 0.1 10*3/uL (ref 0.0–0.5)
Eosinophils Relative: 1 %
HCT: 40.4 % (ref 39.0–52.0)
Hemoglobin: 12.3 g/dL — ABNORMAL LOW (ref 13.0–17.0)
Immature Granulocytes: 1 %
Lymphocytes Relative: 3 %
Lymphs Abs: 0.4 10*3/uL — ABNORMAL LOW (ref 0.7–4.0)
MCH: 32.1 pg (ref 26.0–34.0)
MCHC: 30.4 g/dL (ref 30.0–36.0)
MCV: 105.5 fL — ABNORMAL HIGH (ref 80.0–100.0)
Monocytes Absolute: 0.4 10*3/uL (ref 0.1–1.0)
Monocytes Relative: 3 %
Neutro Abs: 12.4 10*3/uL — ABNORMAL HIGH (ref 1.7–7.7)
Neutrophils Relative %: 92 %
Platelets: 135 10*3/uL — ABNORMAL LOW (ref 150–400)
RBC: 3.83 MIL/uL — ABNORMAL LOW (ref 4.22–5.81)
RDW: 18.7 % — ABNORMAL HIGH (ref 11.5–15.5)
WBC: 13.4 10*3/uL — ABNORMAL HIGH (ref 4.0–10.5)
nRBC: 0.1 % (ref 0.0–0.2)

## 2019-03-11 LAB — PROTIME-INR
INR: 1 (ref 0.8–1.2)
Prothrombin Time: 12.7 seconds (ref 11.4–15.2)

## 2019-03-11 LAB — MAGNESIUM: Magnesium: 2.8 mg/dL — ABNORMAL HIGH (ref 1.7–2.4)

## 2019-03-11 MED ORDER — FUROSEMIDE 10 MG/ML IJ SOLN
40.0000 mg | Freq: Once | INTRAMUSCULAR | Status: AC
  Administered 2019-03-11: 40 mg via INTRAVENOUS
  Filled 2019-03-11: qty 4

## 2019-03-11 MED ORDER — METOPROLOL TARTRATE 5 MG/5ML IV SOLN
2.5000 mg | Freq: Once | INTRAVENOUS | Status: AC
  Administered 2019-03-11: 2.5 mg via INTRAVENOUS
  Filled 2019-03-11: qty 5

## 2019-03-11 NOTE — Progress Notes (Signed)
PROGRESS NOTE  BENI TURRELL  GTX:[id number] DOB: [birth year] DOA: 02/27/2019 PCP: Jodi Marble, MD   Brief Narrative: Clayton Sawyer is a 78 y.o. male with a history of HTN, CAD s/p PCI, PPM, and prostate CA who presented to [facility] with shortness of breath and subsequently admitted to [facility] on 02/14/2019 due to hypoxemic respiratory failure due to covid-19 infection, requiring heated HFNC in the ICU. Steroids were started, remdesivir given for 5 days, and tocilizumab given x2. Inflammatory markers improved, and hypoxemia remained stable. He was transferred out of the ICU 7/23 but subsequently decompensated. After extensive goals of care discussions with the patient, full code status was reinstated and he was transferred back to the ICU requiring intubation and chest tube placement for pneumothorax on 7/28.  With rising leukocytosis, sputum culture was sent and grew pseudomonas and enterobacter which were treated with vancomycin and cefepime initially, now completing a course of zosyn based on susceptibility data. Steroids have continued and are being tapered. Goals of care discussions have been ongoing, assisted by palliative care medicine team.  Assessment & Plan: Principal Problem:   Acute respiratory disease due to COVID-19 virus Active Problems:   Presence of cardiac pacemaker   History of stroke   Essential (primary) hypertension   Atherosclerotic heart disease of native coronary artery without angina pectoris/PCI stents 09/2004, 03/2009 and 10/2011   COVID-19 virus infection   Dyspnea and respiratory abnormalities   Obesity (BMI 30-39.9)   Anxiety disorder   Thrombocytopenia (HCC)   Pressure injury of skin   Acute respiratory failure with hypoxia (HCC)   Goals of care, counseling/discussion   Palliative care by specialist  Acute hypoxic respiratory failure due to Covid-19 infection: s/p tocilizumab x2, remdesivir x5 days.  - Continue IV steroid taper, inflammatory markers normalized.    - Also was enrolled in fibrogen study at admission, next infusion scheduled at 8/12. Granddaughter has inquired about RLF-100 which received IND designation and is undergoing clinical trial. This trial is not available currently at this center, and having already experienced respiratory failure, would not meet inclusion criteria. I also suspect having already enrolled in this trial may preclude enrollment in another.  - Vitamin C and zinc  - Continue airborne, contact precautions. - Prone 16hrs/day as able, intermittent paralytic. This appears to have helped P:F which is 140 when prone.  - Continue ventilator settings and sedation per CCM. D/w PCCM. - Keep net negative balance with lasix, strict I/O. Follow metabolic panel. - Continue goals of care discussions pending clinical trajectory. Hopeful that proning is tolerated and helpful with oxygenation. - Give lasix x1 once again with improvement in Cr, still peripherally edematous.   Septic shock due to HCAP due to pseudomonas and enterobacter:  - Completed 7 days of cefepime per susceptibility data (started 7/31), also received vancomycin x2 days. - Blood cultures drawn 7/31, negative. Initially had contaminants in blood cultures at admission. - Weaned off pressors.   - Leukocytosis returned 8/7; repeat tracheal aspirate again growing abundant Pseudomonas. Agree with zosyn. Needs vigilant monitoring having received tocilizumab.  - Repeat blood and respiratory cultures sent 8/7, follow up respiratory susceptibilities and blood culture results.  Right pneumothorax: s/p chest tube 7/28.  - Management per PCCM, remain to suction at [place] until extubated. Will monitor with intermittent CXR, stable per my personal review on 8/7.  Acute renal failure: - Recurrent with attempts at diuresis. Will hold lasix for now. If oliguric today, can attempt repeat dosing, would favor waiting.  SVT: Transient, responsive to metoprolol. - Continue prn  metoprolol - Continue monitoring  Hyperkalemia: Resolved.   LFT elevation: Due to viral infection.  - Continue to monitor  Steroid-induced hyperglycemia:  - Augmented insulin with 6u + SSI resistant scale q4h. At inpatient goal. With taper of steroids, will need close monitoring to avoid hypoglycemia.  Hypernatremia:  - Continue free water per tube and monitoring Na. Trending slowly back upward.  Thrombocytopenia: Unclear etiology. - HIT panel negative, changed bivalirudin back to lovenox. Continue to monitor for bleeding.   Stage I deep tissue injury: Not POA. Noted 8/1.   CAD s/p PCI: Stable.   Anxiety:  - Continue enteral and IV agents as ordered.  Prostate CA: Quiescent.   Obesity: BMI 36.  - Poor prognostic indicator.  - Continue tube feeding with SSI coverage to optimize glycemic control.  DVT prophylaxis: SCDs, bivalirudin Code Status: DNR Family Communication: Spoke with granddaughter and wife again today.  Disposition Plan: Uncertain, poor prognosis.  Consultants:   PCCM  Palliative care team  Procedures:   Intubation 7/28 >>  Right chest tube 7/28 >>  Antimicrobials:  Remdesivir 7/14 - 7/19  Vancomycin 7/18 - 7/19, 7/31 - 8/1  Cefepime 7/31 - 8/6  Zosyn 8/9 >   Subjective: SVT earlier this AM. Better P:F with proning. No fevers.   Objective: Vitals:   03/11/19 1315 03/11/19 1330 03/11/19 1345 03/11/19 1400  BP: (!) 117/47 (!) 118/49 (!) 121/54 (!) 117/49  Pulse: 85 86 87 90  Resp: (!) 32 (!) 32 (!) 32 (!) 32  Temp:      TempSrc:      SpO2: (!) 87% (!) 86% (!) 87% (!) 88%  Weight:      Height:        Intake/Output Summary (Last 24 hours) at 03/11/2019 1526 Last data filed at 03/11/2019 1400 Gross per 24 hour  Intake 2580.15 ml  Output 2695 ml  Net -114.85 ml   Filed Weights   03/07/19 0254 03/09/19 0500  Weight: 113.4 kg 108.8 kg   Gen: Elderly male in no distress Pulm: Vent supported breaths. Scant crackles. CV: Regular  rate and rhythm. No murmur, rub, or gallop. No JVD, +peripheral edema. GI: Abdomen soft, non-tender, non-distended, with normoactive bowel sounds.  Ext: Warm, no deformities Skin: No new rashes, lesions or ulcers on visualized skin. Neuro: Sedated Psych: UTD  Data Reviewed: I have personally reviewed following labs and imaging studies  CBC: Recent Labs  Lab 03/07/19 0450 03/08/19 0443 03/09/19 0446  03/10/19 0500 03/10/19 1031 03/10/19 1740 03/11/19 0500 03/11/19 0555  WBC 8.8 14.3* 8.5  --  12.5*  --   --  13.4*  --   NEUTROABS 8.1* 13.4* 6.5  --  10.9*  --   --  12.4*  --   HGB 11.6* 13.4 11.2*   < > 12.3* 12.6* 13.3 12.3* 12.9*  HCT 40.0 45.7 36.9*   < > 42.4 37.0* 39.0 40.4 38.0*  MCV 104.4* 104.3* 105.4*  --  106.3*  --   --  105.5*  --   PLT 78* 125* 107*  --  150  --   --  135*  --    < > = values in this interval not displayed.   Basic Metabolic Panel: Recent Labs  Lab 03/07/19 0450 03/08/19 0443 03/09/19 0446  03/10/19 0500 03/10/19 0703 03/10/19 1031 03/10/19 1740 03/11/19 0500 03/11/19 0555  NA 149* 147* 145   < > 149*  --  146*  146* 149* 146*  K 4.8 4.8 4.2   < > 5.0  --  4.3 4.9 4.7 4.3  CL 107 99 99  --  101  --   --   --  102  --   CO2 35* 38* 38*  --  40*  --   --   --  39*  --   GLUCOSE 199* 247* 138*  --  166*  --   --   --  161*  --   BUN 54* 67* 75*  --  102*  --   --   --  99*  --   CREATININE 0.77 1.01 1.08  --  1.63*  --   --   --  1.33*  --   CALCIUM 7.8* 8.6* 7.8*  --  8.2*  --   --   --  8.3*  --   MG  --   --   --   --   --  2.6*  --   --  2.8*  --    < > = values in this interval not displayed.   GFR: Estimated Creatinine Clearance: 55.6 mL/min (A) (by C-G formula based on SCr of 1.33 mg/dL (H)). Liver Function Tests: Recent Labs  Lab 03/07/19 0450 03/08/19 0443 03/09/19 0446 03/10/19 0500 03/11/19 0500  AST 31 39 31 38 39  ALT 49* 59* 49* 58* 53*  ALKPHOS 42 55 43 60 68  BILITOT 0.9 1.0 0.7 0.5 0.7  PROT 4.4* 5.4* 4.3* 5.0*  5.3*  ALBUMIN 2.2* 2.7* 2.3* 2.8* 2.6*   No results for input(s): LIPASE, AMYLASE in the last 168 hours. No results for input(s): AMMONIA in the last 168 hours. Coagulation Profile: Recent Labs  Lab 03/07/19 0450 03/08/19 0443 03/09/19 0446 03/10/19 0500 03/11/19 0500  INR 1.1 1.1 1.1 1.0 1.0   Cardiac Enzymes: No results for input(s): CKTOTAL, CKMB, CKMBINDEX, TROPONINI in the last 168 hours. BNP (last 3 results) No results for input(s): PROBNP in the last 8760 hours. HbA1C: No results for input(s): HGBA1C in the last 72 hours. CBG: Recent Labs  Lab 03/10/19 2011 03/10/19 2318 03/11/19 0422 03/11/19 0745 03/11/19 1217  GLUCAP 169* 187* 116* 158* 166*   Lipid Profile: Recent Labs    03/10/19 0749  TRIG 360*   Thyroid Function Tests: No results for input(s): TSH, T4TOTAL, FREET4, T3FREE, THYROIDAB in the last 72 hours. Anemia Panel: No results for input(s): VITAMINB12, FOLATE, FERRITIN, TIBC, IRON, RETICCTPCT in the last 72 hours. Urine analysis:    Component Value Date/Time   COLORURINE YELLOW 03/01/2019 1530   APPEARANCEUR HAZY (A) 03/01/2019 1530   APPEARANCEUR Clear 04/18/2013 2216   LABSPEC 1.024 03/01/2019 1530   LABSPEC 1.025 04/18/2013 2216   PHURINE 5.0 03/01/2019 1530   GLUCOSEU NEGATIVE 03/01/2019 1530   GLUCOSEU Negative 04/18/2013 2216   HGBUR SMALL (A) 03/01/2019 1530   BILIRUBINUR NEGATIVE 03/01/2019 1530   BILIRUBINUR Negative 04/18/2013 2216   [place] 03/01/2019 1530   PROTEINUR NEGATIVE 03/01/2019 1530   NITRITE POSITIVE (A) 03/01/2019 1530   LEUKOCYTESUR MODERATE (A) 03/01/2019 1530   LEUKOCYTESUR Negative 04/18/2013 2216   Recent Results (from the past 240 hour(s))  Culture, respiratory     Status: None   Collection Time: 03/01/19  3:32 PM   Specimen: Tracheal Aspirate  Result Value Ref Range Status   Specimen Description   Final    TRACHEAL ASPIRATE Performed at [facility], [address]  [place], [place]  [address]    Special Requests   Final    NONE Performed at [facility], [place] [address], [place], [place] [address]    Gram Stain   Final    ABUNDANT WBC PRESENT,BOTH PMN AND MONONUCLEAR MODERATE GRAM NEGATIVE RODS FEW GRAM POSITIVE RODS FEW GRAM POSITIVE COCCI Performed at [facility], [place] [address], [place], [place] [address]    Culture   Final    MODERATE ENTEROBACTER CLOACAE MODERATE PSEUDOMONAS AERUGINOSA    Report Status 03/04/2019 FINAL  Final   Organism ID, Bacteria ENTEROBACTER CLOACAE  Final   Organism ID, Bacteria PSEUDOMONAS AERUGINOSA  Final      Susceptibility   Enterobacter cloacae - MIC*    CEFAZOLIN >=64 RESISTANT Resistant     CEFEPIME <=1 SENSITIVE Sensitive     CEFTAZIDIME <=1 SENSITIVE Sensitive     CEFTRIAXONE <=1 SENSITIVE Sensitive     CIPROFLOXACIN <=0.25 SENSITIVE Sensitive     GENTAMICIN <=1 SENSITIVE Sensitive     IMIPENEM 0.5 SENSITIVE Sensitive     TRIMETH/SULFA <=20 SENSITIVE Sensitive     PIP/TAZO <=4 SENSITIVE Sensitive     * MODERATE ENTEROBACTER CLOACAE   Pseudomonas aeruginosa - MIC*    CEFTAZIDIME 2 SENSITIVE Sensitive     CIPROFLOXACIN <=0.25 SENSITIVE Sensitive     GENTAMICIN <=1 SENSITIVE Sensitive     IMIPENEM 2 SENSITIVE Sensitive     PIP/TAZO <=4 SENSITIVE Sensitive     CEFEPIME <=1 SENSITIVE Sensitive     * MODERATE PSEUDOMONAS AERUGINOSA  Culture, blood (routine x 2)     Status: None (Preliminary result)   Collection Time: 03/08/19 10:45 PM   Specimen: BLOOD  Result Value Ref Range Status   Specimen Description   Final    BLOOD RIGHT THUMB Performed at [facility] [address] [place], [place] [address]    Special Requests   Final    BOTTLES DRAWN AEROBIC AND ANAEROBIC Blood Culture adequate volume Performed at [facility] [address], [place], [place] [address]    Culture   Final    NO GROWTH 2 DAYS Performed at [facility] [address] [place], [place] [address]    Report Status PENDING  Incomplete  Culture, blood (routine x 2)     Status: None (Preliminary result)   Collection Time: 03/08/19 10:52 PM   Specimen: BLOOD  Result Value Ref Range Status   Specimen Description   Final    BLOOD RIGHT INDEX FINGER Performed at [facility] [address], [place], [place] [address]    Special Requests   Final    BOTTLES DRAWN AEROBIC AND ANAEROBIC Blood Culture adequate volume Performed at [facility] [address], [place], [place] [address]    Culture   Final    NO GROWTH 2 DAYS Performed at [facility] [address], [place], [place] [address]    Report Status PENDING  Incomplete  Culture, respiratory     Status: None   Collection Time: 03/08/19 11:30 PM   Specimen: Tracheal Aspirate; Respiratory  Result Value Ref Range Status   Specimen Description   Final    TRACHEAL ASPIRATE Performed at [facility] [address], [place], [place] [address]    Special Requests   Final    Normal Performed at [facility], [place] [address], [place], [place] [address]    Gram Stain  Final    RARE WBC PRESENT, PREDOMINANTLY PMN ABUNDANT GRAM NEGATIVE RODS Performed at [facility], [place] [address] [place], [place] [address]    Culture ABUNDANT PSEUDOMONAS AERUGINOSA  Final   Report Status 03/11/2019 FINAL  Final   Organism ID, Bacteria PSEUDOMONAS AERUGINOSA  Final      Susceptibility   Pseudomonas aeruginosa - MIC*    CEFTAZIDIME 4 SENSITIVE Sensitive     CIPROFLOXACIN <=0.25 SENSITIVE Sensitive     GENTAMICIN <=1 SENSITIVE Sensitive     IMIPENEM 2 SENSITIVE Sensitive     PIP/TAZO 64 SENSITIVE Sensitive     CEFEPIME <=1 SENSITIVE Sensitive     * ABUNDANT PSEUDOMONAS AERUGINOSA      Radiology Studies: No results found.  Scheduled Meds: . artificial tears  1 application Both Eyes U1J  . chlorhexidine  gluconate (MEDLINE KIT)  15 mL Mouth Rinse BID  . Chlorhexidine Gluconate Cloth  6 each Topical Daily  . clonazePAM  2 mg Per Tube Q8H  . enoxaparin (LOVENOX) injection  0.5 mg/kg Subcutaneous BID  . feeding supplement (PRO-STAT SUGAR FREE 64)  60 mL Per Tube TID  . free water  300 mL Per Tube Q6H  . insulin aspart  0-20 Units Subcutaneous Q4H  . insulin aspart  6 Units Subcutaneous Q4H  . mouth rinse  15 mL Mouth Rinse 10 times per day  . [START ON 03/12/2019] methylPREDNISolone (SOLU-MEDROL) injection  20 mg Intravenous Daily   Followed by  . [START ON 03/14/2019] methylPREDNISolone (SOLU-MEDROL) injection  10 mg Intravenous Daily  . multivitamin with minerals  1 tablet Per Tube Daily  . oxyCODONE  10 mg Oral Q6H  . pantoprazole sodium  40 mg Per Tube Daily  . polyethylene glycol  17 g Per Tube BID  . sodium chloride flush  3 mL Intravenous Q12H  . [START ON 03/13/2019] pamrevlumab or placebo  35 mg/kg Intravenous Once  . traZODone  100 mg Oral QHS   Continuous Infusions: . sodium chloride    . sodium chloride    . dextrose    . feeding supplement (VITAL 1.5 CAL) 1,000 mL (03/10/19 1600)  . HYDROmorphone 2 mg/hr (03/11/19 1200)  . ketamine (KETALAR) Adult IV Infusion 1 mg/kg/hr (03/11/19 1219)  . norepinephrine (LEVOPHED) Adult infusion    . piperacillin-tazobactam (ZOSYN)  IV 3.375 g (03/11/19 1444)  . propofol (DIPRIVAN) infusion 25 mcg/kg/min (03/11/19 1505)     LOS: 26 days   Time spent: 35 minutes.  Patrecia Pour, MD [facility] [email or web address] Password [facility] 03/11/2019, 3:26 PM

## 2019-03-11 NOTE — Progress Notes (Signed)
RT NOTE:  RT assisted RN with turning patient supine. ETT secured throughout 26/lip.

## 2019-03-11 NOTE — Progress Notes (Signed)
RT NOTE:  Pt in prone position. RT assistedRN with head turn to theright. ETT secured throughout @ 26/lip

## 2019-03-11 NOTE — Progress Notes (Signed)
[facility] Progress Note Patient Name: Clayton Sawyer DOB: 1941/02/29 MRN: [id number]   Date of Service  03/11/2019  HPI/Events of Note  AFIB/FLUTTER - Ventricular rate = 120's to 160's. Reported to be responsive to B-Blocker. K+ = 4.3.  eICU Interventions  Will order: 1. Metoprolol 2.5 mg IV now. 2. Mg++ level STAT.      Intervention Category Major Interventions: Arrhythmia - evaluation and management  Clayton Sawyer 03/11/2019, 6:39 AM

## 2019-03-11 NOTE — Progress Notes (Signed)
Pt went into SVT rates 160's. Notified ELink, awaiting orders. This occurred x2 yesterday as reported by day shift RN. When given 2.5 IVP metoprolol, pt broke out of rhythm.

## 2019-03-11 NOTE — Progress Notes (Signed)
NAME:  VERNELL TOWNLEY, MRN:  [id number], DOB:  10-Mar-1941, LOS: 27 ADMISSION DATE:  02/25/2019, CONSULTATION DATE: February 13, 2019 REFERRING MD: Dr. 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 Hyperlipidemia History of prostate cancer Obstructive sleep apnea  Significant Hospital Events   7/15 Admit, transfer to [facility] 7/16 enrolled in fibrinogen research drug  7/27 PCCM consulted again for worsening oxygenation, pneumothorax on right 7/28 transferred to the intensive care unit, full code, intubated, right-sided chest tube 7/31 worsening vent settings, severe hypoxemia, high sedation needs 8/2 Worsening ventilator dyssynchrony overnight when attempted to stop Precedex, started bivalrudin 8/4 made DNR, worsening oxygen needs, ventilator synchrony 8/5 family requested transfer to [facility], [place] made call, the did not accept 8/6 palliative care consult  Consults:  Pulmonary and critical care medicine  Procedures:  ETT 7/28 CVC 7/28 Pigtail R 7/28  Significant Diagnostic Tests:    Micro Data:  7/10, 7/15 SARS-CoV-2 positive 7/16 blood > coag neg staph in 1/4 7/31 blood >  7/31 resp > enterobacter, pseudomonas 8/7 resp > abundant pseudomonas, pending final  Antimicrobials/COVID treatment  Ceftriaxone July 14 Azithromycin July 15 Remdesivir July 15 > July 19 Actemra July 15, July 19 Vancomycin July 18, July 19 Solumedrol July 14 Pamrevlumab vs placebo, next scheduled infusion July 29 Cefepime 7/31>   Interim history/subjective:   Proned overnight. Currently supine.  Weaned to FIO2 50%  Objective   Blood pressure (!) 106/45, pulse 81, temperature 99 F (37.2 C), temperature source Axillary, resp. rate (!) 32, height 5\' 9"  (1.753 m), weight 108.8 kg, SpO2 (!) 89 %.  CVP:  [8 mmHg] 8 mmHg  Vent Mode: PRVC FiO2 (%):  [50 %-60 %] 50 % Set Rate:  [32 bmp] 32 bmp Vt Set:  [560 mL] 560 mL PEEP:  [12 cmH20] 12 cmH20 Plateau Pressure:  [30 cmH20-36 cmH20] 30 cmH20   Intake/Output Summary (Last 24 hours) at 03/11/2019 1808 Last data filed at 03/11/2019 1400 Gross per 24 hour  Intake 2392.4 ml  Output 2345 ml  Net 47.4 ml   Filed Weights   03/07/19 0254 03/09/19 0500  Weight: 113.4 kg 108.8 kg   Examination: General:  In bed on vent HENT: NCAT ETT in place PULM: CTA B, vent supported breathing, right chest tube in place without evidence of airleak CV: RRR, no mgr GI: BS+, soft, nontender MSK: normal bulk and tone Neuro: sedated on vent  Resolved Hospital Problem list    Assessment & Plan:  ARDS due to COVID-19 pneumonia: C/b PTX and HCAP. Vent Day 13  Plan Continue mechanical ventilation per ARDS protocol Target TVol 6-8cc/kgIBW Target Plateau Pressure < 30cm H20 Target driving pressure less than 15 cm of water Target PaO2 55-65: titrate PEEP/FiO2 per protocol As long as PaO2 to FiO2 ratio is less than 1:150 position in prone position for 16 hours a day Check CVP daily if CVL in place Target CVP less than 4, diurese as necessary Ventilator associated pneumonia prevention protocol Proning protocol with intermittent paralytics 8/10: Reviewed ABG. Plan to prone again. Start low-dose levophed to facilitate diuresis  Right sided pneumothorax status post 14 French pigtail chest tube catheter Continue chest tube to suction until extubated  HCAP: pseudomonas and enterobacter: Repeat culture with abundant PA: favor active infection vs colonization in setting of worsening leukocytosis  and persistent hypoxemia with S/p Cefepime x 7 days Continue Zosyn  Agitation requiring sedation: episodes of dyssynchrony, fentanyl tachyphylaxis? Continue sedation kentamine, dilaudid and propofol for goal RASS -4 or -5. Wean propofol if able Intermittent  paralytic protocol for proning Continue oxycodone, clonazepam  Thromboctytopenia: not HITT, improving Monitor for bleeding Continue lovenox 0.5mg  sq bid Taper solumedrol  GOC:  Palliative Care consulted. Family requested transfer to another facility which was attempted on 8/5 however request for transfer was denied. Family wishes to remain aggressive including pursuing tracheostomy.  Reasonable to pursue trial of proning for single organ injury though his status remains critical in setting of prolonged illness. Prognosis remains guarded.  Best practice:  Diet: tube feeding Pain/Anxiety/Delirium protocol (if indicated): yes, see above VAP protocol (if indicated): yes DVT prophylaxis: SCD GI prophylaxis: Pantoprazole for stress ulcer prophylaxis Glucose control: per TRH Mobility: bed rest Code Status: DNR Family Communication: per TRH.  Disposition: remain in ICU  Labs   CBC: Recent Labs  Lab 03/07/19 0450 03/08/19 0443 03/09/19 0446  03/10/19 0500 03/10/19 1031 03/10/19 1740 03/11/19 0500 03/11/19 0555  WBC 8.8 14.3* 8.5  --  12.5*  --   --  13.4*  --   NEUTROABS 8.1* 13.4* 6.5  --  10.9*  --   --  12.4*  --   HGB 11.6* 13.4 11.2*   < > 12.3* 12.6* 13.3 12.3* 12.9*  HCT 40.0 45.7 36.9*   < > 42.4 37.0* 39.0 40.4 38.0*  MCV 104.4* 104.3* 105.4*  --  106.3*  --   --  105.5*  --   PLT 78* 125* 107*  --  150  --   --  135*  --    < > = values in this interval not displayed.    Basic Metabolic Panel: Recent Labs  Lab 03/07/19 0450 03/08/19 0443 03/09/19 0446  03/10/19 0500 03/10/19 0703 03/10/19 1031 03/10/19 1740 03/11/19 0500 03/11/19 0555  NA 149* 147* 145   < > 149*  --  146* 146* 149* 146*  K 4.8 4.8 4.2   < > 5.0  --  4.3 4.9 4.7 4.3  CL 107 99 99  --  101  --   --   --  102  --   CO2 35* 38* 38*  --  40*  --   --   --  39*  --   GLUCOSE 199* 247* 138*  --  166*  --   --   --  161*  --   BUN 54* 67* 75*  --  102*  --   --   --  99*  --   CREATININE 0.77  1.01 1.08  --  1.63*  --   --   --  1.33*  --   CALCIUM 7.8* 8.6* 7.8*  --  8.2*  --   --   --  8.3*  --   MG  --   --   --   --   --  2.6*  --   --  2.8*  --    < > = values in this interval not displayed.   GFR: Estimated Creatinine Clearance: 55.6 mL/min (A) (by C-G formula based on SCr of 1.33 mg/dL (H)). Recent Labs  Lab 03/08/19 0443 03/09/19 0446 03/10/19 0500 03/11/19 0500  WBC 14.3* 8.5 12.5* 13.4*    Liver Function Tests: Recent Labs  Lab 03/07/19 0450 03/08/19 0443 03/09/19 0446 03/10/19 0500 03/11/19 0500  AST 31 39  31 38 39  ALT 49* 59* 49* 58* 53*  ALKPHOS 42 55 43 60 68  BILITOT 0.9 1.0 0.7 0.5 0.7  PROT 4.4* 5.4* 4.3* 5.0* 5.3*  ALBUMIN 2.2* 2.7* 2.3* 2.8* 2.6*   No results for input(s): LIPASE, AMYLASE in the last 168 hours. No results for input(s): AMMONIA in the last 168 hours.  ABG    Component Value Date/Time   PHART 7.331 (L) 03/11/2019 0555   PCO2ART 82.3 (HH) 03/11/2019 0555   PO2ART 63.0 (L) 03/11/2019 0555   HCO3 43.2 (H) 03/11/2019 0555   TCO2 46 (H) 03/11/2019 0555   O2SAT 87.0 03/11/2019 0555     Coagulation Profile: Recent Labs  Lab 03/07/19 0450 03/08/19 0443 03/09/19 0446 03/10/19 0500 03/11/19 0500  INR 1.1 1.1 1.1 1.0 1.0    Cardiac Enzymes: No results for input(s): CKTOTAL, CKMB, CKMBINDEX, TROPONINI in the last 168 hours.  HbA1C: Hemoglobin A1C  Date/Time Value Ref Range Status  09/22/2012 02:49 AM 5.9 4.2 - 6.3 % Final    Comment:    The American Diabetes Association recommends that a primary goal of therapy should be <7% and that physicians should reevaluate the treatment regimen in patients with HbA1c values consistently >8%.     CBG: Recent Labs  Lab 03/10/19 2318 03/11/19 0422 03/11/19 0745 03/11/19 1217 03/11/19 1617  GLUCAP 187* 116* 158* 166* 140*     Critical care time: 36 minutes    The patient is critically ill with multiple organ systems failure and requires high complexity decision  making for assessment and support, frequent evaluation and titration of therapies, application of advanced monitoring technologies and extensive interpretation of multiple databases.   Discussed and co-managed patient care with PCCM-Hospitalist. Coordinated care with RT, RN and pharmacist.  Rodman Pickle, M.D. [facility] Pulmonary/Critical Care Medicine 03/11/2019 6:08 PM  Pager: [phone] After hours pager: [phone]

## 2019-03-12 LAB — POCT I-STAT 7, (LYTES, BLD GAS, ICA,H+H)
Acid-Base Excess: 11 mmol/L — ABNORMAL HIGH (ref 0.0–2.0)
Bicarbonate: 41.3 mmol/L — ABNORMAL HIGH (ref 20.0–28.0)
Calcium, Ion: 1.19 mmol/L (ref 1.15–1.40)
HCT: 36 % — ABNORMAL LOW (ref 39.0–52.0)
Hemoglobin: 12.2 g/dL — ABNORMAL LOW (ref 13.0–17.0)
O2 Saturation: 88 %
Patient temperature: 99
Potassium: 4.1 mmol/L (ref 3.5–5.1)
Sodium: 147 mmol/L — ABNORMAL HIGH (ref 135–145)
TCO2: 44 mmol/L — ABNORMAL HIGH (ref 22–32)
pCO2 arterial: 87.6 mmHg (ref 32.0–48.0)
pH, Arterial: 7.283 — ABNORMAL LOW (ref 7.350–7.450)
pO2, Arterial: 66 mmHg — ABNORMAL LOW (ref 83.0–108.0)

## 2019-03-12 LAB — COMPREHENSIVE METABOLIC PANEL
ALT: 51 U/L — ABNORMAL HIGH (ref 0–44)
AST: 39 U/L (ref 15–41)
Albumin: 2.4 g/dL — ABNORMAL LOW (ref 3.5–5.0)
Alkaline Phosphatase: 75 U/L (ref 38–126)
Anion gap: 10 (ref 5–15)
BUN: 95 mg/dL — ABNORMAL HIGH (ref 8–23)
CO2: 38 mmol/L — ABNORMAL HIGH (ref 22–32)
Calcium: 8.1 mg/dL — ABNORMAL LOW (ref 8.9–10.3)
Chloride: 101 mmol/L (ref 98–111)
Creatinine, Ser: 1.29 mg/dL — ABNORMAL HIGH (ref 0.61–1.24)
GFR calc Af Amer: >60 mL/min (ref >60)
GFR calc non Af Amer: 53 mL/min — ABNORMAL LOW (ref >60)
Glucose, Bld: 151 mg/dL — ABNORMAL HIGH (ref 70–99)
Potassium: 4.3 mmol/L (ref 3.5–5.1)
Sodium: 149 mmol/L — ABNORMAL HIGH (ref 135–145)
Total Bilirubin: 0.6 mg/dL (ref 0.3–1.2)
Total Protein: 5.1 g/dL — ABNORMAL LOW (ref 6.5–8.1)

## 2019-03-12 LAB — CBC WITH DIFFERENTIAL/PLATELET
Abs Immature Granulocytes: 0.18 10*3/uL — ABNORMAL HIGH (ref 0.00–0.07)
Basophils Absolute: 0 10*3/uL (ref 0.0–0.1)
Basophils Relative: 0 %
Eosinophils Absolute: 0.2 10*3/uL (ref 0.0–0.5)
Eosinophils Relative: 1 %
HCT: 39.3 % (ref 39.0–52.0)
Hemoglobin: 11.8 g/dL — ABNORMAL LOW (ref 13.0–17.0)
Immature Granulocytes: 1 %
Lymphocytes Relative: 4 %
Lymphs Abs: 0.7 10*3/uL (ref 0.7–4.0)
MCH: 31.7 pg (ref 26.0–34.0)
MCHC: 30 g/dL (ref 30.0–36.0)
MCV: 105.6 fL — ABNORMAL HIGH (ref 80.0–100.0)
Monocytes Absolute: 0.7 10*3/uL (ref 0.1–1.0)
Monocytes Relative: 5 %
Neutro Abs: 13.9 10*3/uL — ABNORMAL HIGH (ref 1.7–7.7)
Neutrophils Relative %: 89 %
Platelets: 162 10*3/uL (ref 150–400)
RBC: 3.72 MIL/uL — ABNORMAL LOW (ref 4.22–5.81)
RDW: 19.8 % — ABNORMAL HIGH (ref 11.5–15.5)
WBC: 15.7 10*3/uL — ABNORMAL HIGH (ref 4.0–10.5)
nRBC: 0.3 % — ABNORMAL HIGH (ref 0.0–0.2)

## 2019-03-12 LAB — GLUCOSE, CAPILLARY
Glucose-Capillary: 117 mg/dL — ABNORMAL HIGH (ref 70–99)
Glucose-Capillary: 144 mg/dL — ABNORMAL HIGH (ref 70–99)
Glucose-Capillary: 144 mg/dL — ABNORMAL HIGH (ref 70–99)
Glucose-Capillary: 145 mg/dL — ABNORMAL HIGH (ref 70–99)
Glucose-Capillary: 163 mg/dL — ABNORMAL HIGH (ref 70–99)
Glucose-Capillary: 202 mg/dL — ABNORMAL HIGH (ref 70–99)
Glucose-Capillary: 222 mg/dL — ABNORMAL HIGH (ref 70–99)

## 2019-03-12 LAB — TRIGLYCERIDES: Triglycerides: 220 mg/dL — ABNORMAL HIGH (ref <150)

## 2019-03-12 LAB — PROTIME-INR
INR: 1 (ref 0.8–1.2)
Prothrombin Time: 12.7 seconds (ref 11.4–15.2)

## 2019-03-12 MED ORDER — FUROSEMIDE 10 MG/ML IJ SOLN
60.0000 mg | Freq: Once | INTRAMUSCULAR | Status: AC
  Administered 2019-03-12: 60 mg via INTRAVENOUS
  Filled 2019-03-12: qty 6

## 2019-03-12 NOTE — Progress Notes (Signed)
Pt's head turned at this time to the right without complication.  "proning pillow' still in place at this time and keeping pressure off pt's face. RT will continue to monitor.

## 2019-03-12 NOTE — Progress Notes (Signed)
Pt's head turned at this time with "proning pillow' still in place and is still keeping pressure off pt's face.  Pt's tube had dislodged to 23 at lip and was advanced after turning head back to original placement at 26 @ lip.  Pt's VS stable during turn and no complications noted. RT will continue to monitor.

## 2019-03-12 NOTE — Progress Notes (Signed)
NAME:  Clayton Sawyer, MRN:  [id number], DOB:  [birth year], LOS: 53 ADMISSION DATE:  03/01/2019, CONSULTATION DATE: February 13, 2019 REFERRING MD: Dr. 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 Hyperlipidemia History of prostate cancer Obstructive sleep apnea  Significant Hospital Events   7/15 Admit, transfer to [facility] 7/16 enrolled in fibrinogen research drug  7/27 PCCM consulted again for worsening oxygenation, pneumothorax on right 7/28 transferred to the intensive care unit, full code, intubated, right-sided chest tube 7/31 worsening vent settings, severe hypoxemia, high sedation needs 8/2 Worsening ventilator dyssynchrony overnight when attempted to stop Precedex, started bivalrudin 8/4 made DNR, worsening oxygen needs, ventilator synchrony 8/5 family requested transfer to [facility], [place] made call, the did not accept 8/6 palliative care consult. Family declined 8/7 Responsive to increased PEEP 8/8-8/11 Proned with improved oxygenation   Consults:  Pulmonary and critical care medicine  Procedures:  ETT 7/28 CVC 7/28 Pigtail R 7/28  Significant Diagnostic Tests:    Micro Data:  7/10, 7/15 SARS-CoV-2 positive 7/16 blood > coag neg staph in 1/4 7/31 blood >  7/31 resp > enterobacter, pseudomonas 8/7 resp > abundant pseudomonas, pending final  Antimicrobials/COVID treatment  Ceftriaxone July 14 Azithromycin July 15 Remdesivir July 15 > July 19 Actemra July 15, July 19 Vancomycin July 18, July 19 Solumedrol July 14 Pamrevlumab vs placebo, next scheduled infusion July 29 Cefepime 7/31>   Interim history/subjective:   Proned this morning. Currently on FIO2 60 and PEEP 12  Objective   Blood pressure (!) 114/46, pulse 84, temperature 99.2 F (37.3 C), temperature  source Axillary, resp. rate (!) 32, height 5\' 9"  (1.753 m), weight 108.8 kg, SpO2 90 %. CVP:  [9 mmHg-10 mmHg] 9 mmHg  Vent Mode: PRVC FiO2 (%):  [50 %-60 %] 60 % Set Rate:  [32 bmp] 32 bmp Vt Set:  [560 mL] 560 mL PEEP:  [12 cmH20] 12 cmH20 Plateau Pressure:  [30 cmH20-42 cmH20] 35 cmH20   Intake/Output Summary (Last 24 hours) at 03/12/2019 0933 Last data filed at 03/12/2019 7169 Gross per 24 hour  Intake 2375.78 ml  Output 2335 ml  Net 40.78 ml   Filed Weights   03/07/19 0254 03/09/19 0500  Weight: 113.4 kg 108.8 kg   Examination: General:  In bed on vent HENT: NCAT ETT in place PULM: CTA B, vent supported breathing, right chest tube in place without evidence of airleak CV: RRR, no mgr GI: BS+, soft, nontender MSK: normal bulk and tone Neuro: sedated on vent Skin: Macupapular rash located only on upper back, sparing buttocks and extremities - improving  Resolved Hospital Problem list    Assessment & Plan:  ARDS due to COVID-19 pneumonia: C/b PTX and recurrent Pseudomonas HCAP. Vent Day 13  Plan Continue mechanical ventilation per ARDS protocol Target TVol 6-8cc/kgIBW Target Plateau Pressure < 30cm H20 Target driving pressure less than 15 cm of water Target PaO2 55-65: titrate PEEP/FiO2 per protocol As long as PaO2 to FiO2 ratio is less than 1:150 position in prone position for 16 hours a day Check CVP daily if CVL in place Target CVP less than 4, diurese as necessary Ventilator associated pneumonia prevention protocol Proning protocol with intermittent paralytics 8/10: ABG ordered. CFB 4.9L Diurese with low-pressor support.   Right sided pneumothorax status post 14 French pigtail chest tube catheter  Continue chest tube to suction until extubated  HCAP: pseudomonas and enterobacter: Repeat culture with abundant PA: favor active infection vs colonization in setting of worsening leukocytosis and persistent hypoxemia S/p Cefepime x 7 days Continue Zosyn for 7 day  course.  Agitation requiring sedation: episodes of dyssynchrony, fentanyl tachyphylaxis? Continue sedation: kentamine, dilaudid and propofol for goal RASS -4 or -5. Wean propofol if able Intermittent paralytic protocol for proning Continue oxycodone, clonazepam  Thromboctytopenia: not HITT, resolved Continue lovenox 0.5mg  sq bid. Monitor for bleeding Taper prednisone off  GOC:  Palliative Care consulted. Family requested transfer to another facility which was attempted on 8/5 however request for transfer was denied. Family wishes to remain aggressive including pursuing tracheostomy.  Reasonable to pursue trial of proning for single organ injury though his status remains critical in setting of prolonged illness. Prognosis remains guarded.  Best practice:  Diet: tube feeding Pain/Anxiety/Delirium protocol (if indicated): yes, see above VAP protocol (if indicated): yes DVT prophylaxis: SCD GI prophylaxis: Pantoprazole for stress ulcer prophylaxis Glucose control: per TRH Mobility: bed rest Code Status: DNR Family Communication: per TRH.  Disposition: remain in ICU  Labs   CBC: Recent Labs  Lab 03/08/19 0443 03/09/19 0446  03/10/19 0500 03/10/19 1031 03/10/19 1740 03/11/19 0500 03/11/19 0555 03/12/19 0504  WBC 14.3* 8.5  --  12.5*  --   --  13.4*  --  15.7*  NEUTROABS 13.4* 6.5  --  10.9*  --   --  12.4*  --  13.9*  HGB 13.4 11.2*   < > 12.3* 12.6* 13.3 12.3* 12.9* 11.8*  HCT 45.7 36.9*   < > 42.4 37.0* 39.0 40.4 38.0* 39.3  MCV 104.3* 105.4*  --  106.3*  --   --  105.5*  --  105.6*  PLT 125* 107*  --  150  --   --  135*  --  162   < > = values in this interval not displayed.    Basic Metabolic Panel: Recent Labs  Lab 03/08/19 0443 03/09/19 0446  03/10/19 0500 03/10/19 0703 03/10/19 1031 03/10/19 1740 03/11/19 0500 03/11/19 0555 03/12/19 0504  NA 147* 145   < > 149*  --  146* 146* 149* 146* 149*  K 4.8 4.2   < > 5.0  --  4.3 4.9 4.7 4.3 4.3  CL 99 99  --   101  --   --   --  102  --  101  CO2 38* 38*  --  40*  --   --   --  39*  --  38*  GLUCOSE 247* 138*  --  166*  --   --   --  161*  --  151*  BUN 67* 75*  --  102*  --   --   --  99*  --  95*  CREATININE 1.01 1.08  --  1.63*  --   --   --  1.33*  --  1.29*  CALCIUM 8.6* 7.8*  --  8.2*  --   --   --  8.3*  --  8.1*  MG  --   --   --   --  2.6*  --   --  2.8*  --   --    < > = values in this interval not displayed.   GFR: Estimated Creatinine Clearance: 57.3 mL/min (A) (by C-G formula based on SCr of 1.29 mg/dL (H)). Recent Labs  Lab 03/09/19 0446 03/10/19 0500 03/11/19 0500 03/12/19  0504  WBC 8.5 12.5* 13.4* 15.7*    Liver Function Tests: Recent Labs  Lab 03/08/19 0443 03/09/19 0446 03/10/19 0500 03/11/19 0500 03/12/19 0504  AST 39 31 38 39 39  ALT 59* 49* 58* 53* 51*  ALKPHOS 55 43 60 68 75  BILITOT 1.0 0.7 0.5 0.7 0.6  PROT 5.4* 4.3* 5.0* 5.3* 5.1*  ALBUMIN 2.7* 2.3* 2.8* 2.6* 2.4*   No results for input(s): LIPASE, AMYLASE in the last 168 hours. No results for input(s): AMMONIA in the last 168 hours.  ABG    Component Value Date/Time   PHART 7.331 (L) 03/11/2019 0555   PCO2ART 82.3 (HH) 03/11/2019 0555   PO2ART 63.0 (L) 03/11/2019 0555   HCO3 43.2 (H) 03/11/2019 0555   TCO2 46 (H) 03/11/2019 0555   O2SAT 87.0 03/11/2019 0555     Coagulation Profile: Recent Labs  Lab 03/08/19 0443 03/09/19 0446 03/10/19 0500 03/11/19 0500 03/12/19 0504  INR 1.1 1.1 1.0 1.0 1.0    Cardiac Enzymes: No results for input(s): CKTOTAL, CKMB, CKMBINDEX, TROPONINI in the last 168 hours.  HbA1C: Hemoglobin A1C  Date/Time Value Ref Range Status  09/22/2012 02:49 AM 5.9 4.2 - 6.3 % Final    Comment:    The American Diabetes Association recommends that a primary goal of therapy should be <7% and that physicians should reevaluate the treatment regimen in patients with HbA1c values consistently >8%.     CBG: Recent Labs  Lab 03/11/19 1217 03/11/19 1617 03/11/19 2048  03/11/19 2352 03/12/19 0819  GLUCAP 166* 140* 115* 117* 222*     Critical care time: 36 minutes    The patient is critically ill with multiple organ systems failure and requires high complexity decision making for assessment and support, frequent evaluation and titration of therapies, application of advanced monitoring technologies and extensive interpretation of multiple databases.   Discussed and co-managed patient care with PCCM-Hospitalist. Coordinated care with RT, RN and pharmacist.  Rodman Pickle, M.D. [facility] Pulmonary/Critical Care Medicine 03/12/2019 9:33 AM  Pager: [phone] After hours pager: [phone]

## 2019-03-12 NOTE — TOC Progression Note (Signed)
Transition of Care [facility]) - Progression Note    Patient Details  Name: Clayton Sawyer MRN: [id number] Date of Birth: [birth year]  [facility] CM/SW Contact  Maryclare Labrador, RN Phone Number: 03/12/2019, 1:42 PM  Clinical Narrative:   PTA independent from home with wife.  Palliative involved with ongoing [place] discussions.  TOC will continue to follow      Barriers to Discharge: Other (comment)(COVID positive, ongoing continued medical work up)  Expected Discharge Plan and Services                                                 Social Determinants of Health (SDOH) Interventions    Readmission Risk Interventions No flowsheet data found.

## 2019-03-12 NOTE — Research (Signed)
   PI Note   D/w Grandaughter Ephriam Jenkins - she verbally consented for continued d28 infusion on fibrogen study for acute covid tomorrow. Then d/w Primary MD Dr Vance Gather - he is ok with patient getting infusion and does not see any contraindicatons as thing stand right now     SIGNATURE    Dr. Brand Males, M.D., F.C.C.P, ACRP-CPI Pulmonary and Critical Care Medicine Research Investigator, [facility] [place] Staff Physician, [place] Director - Interstitial Lung Disease  Program  P[facility] and [facility] [place], [place], [address]  Pager: [phone], If no answer or between  15:00h - 7:00h: call [phone] Telephone: [phone]  4:34 PM 03/12/2019

## 2019-03-12 NOTE — Progress Notes (Signed)
 Nutrition Follow-up   RD working remotely.   DOCUMENTATION CODES:   Obesity unspecified  INTERVENTION:   Tube Feeding:  Continue Vital 1.5 at 40 ml/hr Continue Pro-Stat 60 mL TID Provide 155 g of protein, 2040 kcals, 730 mL of free water Meets 100% protein needs, 87% calorie needs  TF regimen and propofol at current rate providing 2455 total kcal/day (105 % of kcal needs)  Continue MVI with Minerals daily  NUTRITION DIAGNOSIS:   Inadequate oral intake related to acute illness as evidenced by NPO status.  Being addressed via TF   GOAL:   Patient will meet greater than or equal to 90% of their needs  Progressing  MONITOR:   TF tolerance, Vent status, Skin, Weight trends, Labs  REASON FOR ASSESSMENT:   Consult, Ventilator Enteral/tube feeding initiation and management  ASSESSMENT:   78 yo male admitted on 7/15 with acute respiratory failure due to COVID-19 virus, pt with decline in respiratory status and pneumothorax requring transfer to ICU and intubation on 7/28.  PMH includes stroke, HTN, CAD  7/15 Admit 7/27 R. Pneumothorax 7/28 Intubated, Chest tube placed 7/29 Cortrak (PP), TF initiated 8/04 Changed to DNR 8/06 Palliative Care Consult, Family declined 8/08-8/11 Proned with improved oxygenation  Patient is currently intubated on ventilator support, pt pn propofol, hydromorphone and ketamine drips MV: 17.5 L/min Temp (24hrs), Avg:99.7 F (37.6 C), Min:99 F (37.2 C), Max:100.4 F (38 C)  Propofol: 15.7 ml/hr (415 kcals/24 hours)  Noted Vital 1.5 order changed to 40 ml/hr on 8/08 when pt began to prone; continues with Pro-Stat 60 mL TID and 300 mL free water q 6 hours via Post-pyloric Cortrak. Tolerating TF per chart review  Current weight 108.8 kg; weight 101.6 kg on 7/29. Net + 5L per I/O flow sheet  Labs: sodium 149 (H), Creatinine 1.29, BUN 95 Meds: MVI with minerals, propofol, solumedrol, ss novolog q 4 hours  Diet Order:   Diet Order         Diet NPO time specified  Diet effective now              EDUCATION NEEDS:   Not appropriate for education at this time  Skin:  Skin Assessment: Skin Integrity Issues: Skin Integrity Issues:: DTI DTI: sacrum  Last BM:  8/11 rectal tube  Height:   Ht Readings from Last 1 Encounters:  02/26/19 5\' 9"  (1.753 m)    Weight:   Wt Readings from Last 1 Encounters:  03/09/19 108.8 kg    Ideal Body Weight:  72.7 kg  BMI:  Body mass index is 35.42 kg/m.  Estimated Nutritional Needs:   Kcal:  2339 kcals  Protein:  145-180 g  Fluid:  >/= 1.8 L   Ahsha Hinsley MS, RDN, LDN, CNSC [phone] Pager  [phone] Weekend/On-Call Pager

## 2019-03-12 NOTE — Progress Notes (Signed)
PROGRESS NOTE  Clayton Sawyer  FXJ:[id number] DOB: April 03, 1941 DOA: 02/10/2019 PCP: Jodi Marble, MD   Brief Narrative: Clayton Sawyer is a 78 y.o. male with a history of HTN, CAD s/p PCI, PPM, and prostate CA who presented to [facility] with shortness of breath and subsequently admitted to [facility] on 02/16/2019 due to hypoxemic respiratory failure due to covid-19 infection, requiring heated HFNC in the ICU. Steroids were started, remdesivir given for 5 days, and tocilizumab given x2. Inflammatory markers improved, and hypoxemia remained stable. He was transferred out of the ICU 7/23 but subsequently decompensated. After extensive goals of care discussions with the patient, full code status was reinstated and he was transferred back to the ICU requiring intubation and chest tube placement for pneumothorax on 7/28.  With rising leukocytosis, sputum culture was sent and grew pseudomonas and enterobacter which were treated with vancomycin and cefepime initially, now completing a course of zosyn based on susceptibility data. Steroids have continued and are being tapered. Goals of care discussions have been ongoing, assisted by palliative care medicine team.  Assessment & Plan: Principal Problem:   Acute respiratory disease due to COVID-19 virus Active Problems:   Presence of cardiac pacemaker   History of stroke   Essential (primary) hypertension   Atherosclerotic heart disease of native coronary artery without angina pectoris/PCI stents 09/2004, 03/2009 and 10/2011   COVID-19 virus infection   Dyspnea and respiratory abnormalities   Obesity (BMI 30-39.9)   Anxiety disorder   Thrombocytopenia (HCC)   Pressure injury of skin   Acute respiratory failure with hypoxia (HCC)   Goals of care, counseling/discussion   Palliative care by specialist  Acute hypoxic respiratory failure due to Covid-19 infection: s/p tocilizumab x2, remdesivir x5 days.  - Continue IV steroid taper as ordered, inflammatory markers  normalized.   - Also was enrolled in fibrogen study at admission, next infusion scheduled 8/12.  - Vitamin C and zinc  - Continue airborne, contact precautions. - Prone 16hrs/day as able. This appears to have helped P:F. - Continue ventilator settings and sedation per CCM. D/w PCCM. - Continue goals of care discussions pending clinical trajectory. Hopeful that proning is tolerated and helpful with oxygenation. - Continue diuresis as able with pressor support prn, still peripherally edematous.   Septic shock due to HCAP due to pseudomonas and enterobacter with recurrent pseudomonas pneumonia:  - Completed 7 days of cefepime per susceptibility data (started 7/31), also received vancomycin x2 days. - Blood cultures drawn 7/31, negative. Initially had contaminants in blood cultures at admission. - Weaned off pressors.   - Leukocytosis returned 8/7; repeat tracheal aspirate again growing abundant Pseudomonas. Agree with zosyn (8/9 - 8/15). Needs vigilant monitoring having received tocilizumab.  - Repeat blood and respiratory cultures sent 8/7, follow up respiratory susceptibilities and blood culture results.  Right pneumothorax: s/p chest tube 7/28.  - Management per PCCM, remain to suction at [place] until extubated.   Acute renal failure: - Recurrent with attempts at diuresis.  SVT: Transient, responsive to metoprolol. - Continue prn metoprolol - Continue monitoring of rhythm and electrolytes.  Hyperkalemia: Resolved.   LFT elevation: Due to viral infection.  - Continue to monitor  Steroid-induced hyperglycemia:  - Augmented insulin with 6u + SSI resistant scale q4h. At inpatient goal. With taper of steroids, will need close monitoring to avoid hypoglycemia.  Hypernatremia:  - Continue free water per tube and monitoring Na.   Thrombocytopenia: Unclear etiology. - HIT panel negative, changed bivalirudin back to lovenox. Continue  to monitor for bleeding.   Stage I deep tissue  injury: Not POA. Noted 8/1.   CAD s/p PCI: Stable.   Anxiety:  - Continue enteral and IV agents as ordered.  Prostate CA: Quiescent.   Obesity: BMI 36.  - Poor prognostic indicator.  - Continue tube feeding with SSI coverage to optimize glycemic control.  DVT prophylaxis: Lovenox Code Status: DNR Family Communication: Spoke with granddaughter and wife again today.  Disposition Plan: Uncertain, poor prognosis.  Consultants:   PCCM  Palliative care team  Procedures:   Intubation 7/28 >>  Right chest tube 7/28 >>  Antimicrobials:  Remdesivir 7/14 - 7/19  Vancomycin 7/18 - 7/19, 7/31 - 8/1  Cefepime 7/31 - 8/6  Zosyn 8/9 >   Subjective: Proning this morning. No new issues per RN. No fevers.  Objective: Vitals:   03/12/19 1300 03/12/19 1315 03/12/19 1330 03/12/19 1345  BP: (!) 131/50 (!) 133/53 (!) 123/52 130/63  Pulse: 88 91 87 87  Resp: (!) 32 (!) 28 (!) 28 (!) 26  Temp:      TempSrc:      SpO2: 90% 90% 92% 92%  Weight:      Height:        Intake/Output Summary (Last 24 hours) at 03/12/2019 1424 Last data filed at 03/12/2019 1200 Gross per 24 hour  Intake 2481.16 ml  Output 2410 ml  Net 71.16 ml   Filed Weights   03/07/19 0254 03/09/19 0500  Weight: 113.4 kg 108.8 kg   Gen: Elderly male in no distress Pulm: Vent-supported breathing, clear CV: Regular rate and rhythm. No murmur, rub, or gallop. No JVD, trace dependent edema. GI: Abdomen soft, non-tender, non-distended, with normoactive bowel sounds.  Ext: Warm, no deformities Skin: Right chest tube site c/d/i. Papular eruption across back, stable. Neuro: Sedated on vent Psych: UTD  Data Reviewed: I have personally reviewed following labs and imaging studies  CBC: Recent Labs  Lab 03/08/19 0443 03/09/19 0446  03/10/19 0500  03/10/19 1740 03/11/19 0500 03/11/19 0555 03/12/19 0504 03/12/19 1054  WBC 14.3* 8.5  --  12.5*  --   --  13.4*  --  15.7*  --   NEUTROABS 13.4* 6.5  --  10.9*  --    --  12.4*  --  13.9*  --   HGB 13.4 11.2*   < > 12.3*   < > 13.3 12.3* 12.9* 11.8* 12.2*  HCT 45.7 36.9*   < > 42.4   < > 39.0 40.4 38.0* 39.3 36.0*  MCV 104.3* 105.4*  --  106.3*  --   --  105.5*  --  105.6*  --   PLT 125* 107*  --  150  --   --  135*  --  162  --    < > = values in this interval not displayed.   Basic Metabolic Panel: Recent Labs  Lab 03/08/19 0443 03/09/19 0446  03/10/19 0500 03/10/19 0703  03/10/19 1740 03/11/19 0500 03/11/19 0555 03/12/19 0504 03/12/19 1054  NA 147* 145   < > 149*  --    < > 146* 149* 146* 149* 147*  K 4.8 4.2   < > 5.0  --    < > 4.9 4.7 4.3 4.3 4.1  CL 99 99  --  101  --   --   --  102  --  101  --   CO2 38* 38*  --  40*  --   --   --  39*  --  38*  --   GLUCOSE 247* 138*  --  166*  --   --   --  161*  --  151*  --   BUN 67* 75*  --  102*  --   --   --  99*  --  95*  --   CREATININE 1.01 1.08  --  1.63*  --   --   --  1.33*  --  1.29*  --   CALCIUM 8.6* 7.8*  --  8.2*  --   --   --  8.3*  --  8.1*  --   MG  --   --   --   --  2.6*  --   --  2.8*  --   --   --    < > = values in this interval not displayed.   GFR: Estimated Creatinine Clearance: 57.3 mL/min (A) (by C-G formula based on SCr of 1.29 mg/dL (H)). Liver Function Tests: Recent Labs  Lab 03/08/19 0443 03/09/19 0446 03/10/19 0500 03/11/19 0500 03/12/19 0504  AST 39 31 38 39 39  ALT 59* 49* 58* 53* 51*  ALKPHOS 55 43 60 68 75  BILITOT 1.0 0.7 0.5 0.7 0.6  PROT 5.4* 4.3* 5.0* 5.3* 5.1*  ALBUMIN 2.7* 2.3* 2.8* 2.6* 2.4*   No results for input(s): LIPASE, AMYLASE in the last 168 hours. No results for input(s): AMMONIA in the last 168 hours. Coagulation Profile: Recent Labs  Lab 03/08/19 0443 03/09/19 0446 03/10/19 0500 03/11/19 0500 03/12/19 0504  INR 1.1 1.1 1.0 1.0 1.0   Cardiac Enzymes: No results for input(s): CKTOTAL, CKMB, CKMBINDEX, TROPONINI in the last 168 hours. BNP (last 3 results) No results for input(s): PROBNP in the last 8760 hours. HbA1C: No  results for input(s): HGBA1C in the last 72 hours. CBG: Recent Labs  Lab 03/11/19 2048 03/11/19 2352 03/12/19 0330 03/12/19 0819 03/12/19 1209  GLUCAP 115* 117* 144* 222* 202*   Lipid Profile: Recent Labs    03/10/19 0749 03/12/19 0504  TRIG 360* 220*   Thyroid Function Tests: No results for input(s): TSH, T4TOTAL, FREET4, T3FREE, THYROIDAB in the last 72 hours. Anemia Panel: No results for input(s): VITAMINB12, FOLATE, FERRITIN, TIBC, IRON, RETICCTPCT in the last 72 hours. Urine analysis:    Component Value Date/Time   COLORURINE YELLOW 03/01/2019 1530   APPEARANCEUR HAZY (A) 03/01/2019 1530   APPEARANCEUR Clear 04/18/2013 2216   LABSPEC 1.024 03/01/2019 1530   LABSPEC 1.025 04/18/2013 2216   PHURINE 5.0 03/01/2019 1530   GLUCOSEU NEGATIVE 03/01/2019 1530   GLUCOSEU Negative 04/18/2013 2216   HGBUR SMALL (A) 03/01/2019 1530   BILIRUBINUR NEGATIVE 03/01/2019 1530   BILIRUBINUR Negative 04/18/2013 2216   [place] 03/01/2019 1530   PROTEINUR NEGATIVE 03/01/2019 1530   NITRITE POSITIVE (A) 03/01/2019 1530   LEUKOCYTESUR MODERATE (A) 03/01/2019 1530   LEUKOCYTESUR Negative 04/18/2013 2216   Recent Results (from the past 240 hour(s))  Culture, blood (routine x 2)     Status: None (Preliminary result)   Collection Time: 03/08/19 10:45 PM   Specimen: BLOOD  Result Value Ref Range Status   Specimen Description   Final    BLOOD RIGHT THUMB Performed at [facility], [place] [address], [place], [place] [address]    Special Requests   Final    BOTTLES DRAWN AEROBIC AND ANAEROBIC Blood Culture adequate volume Performed at [place] [address] [place], [place] [address]  Culture   Final    NO GROWTH 3 DAYS Performed at [facility], [place] [address], [place], [place] [address]    Report Status PENDING  Incomplete  Culture, blood (routine x 2)     Status: None (Preliminary result)   Collection Time: 03/08/19  10:52 PM   Specimen: BLOOD  Result Value Ref Range Status   Specimen Description   Final    BLOOD RIGHT INDEX FINGER Performed at [place] [address] [place], [place] [address]    Special Requests   Final    BOTTLES DRAWN AEROBIC AND ANAEROBIC Blood Culture adequate volume Performed at [place] [address] [place], [place] [address]    Culture   Final    NO GROWTH 3 DAYS Performed at [facility], [place] [address], [place], [place] [address]    Report Status PENDING  Incomplete  Culture, respiratory     Status: None   Collection Time: 03/08/19 11:30 PM   Specimen: Tracheal Aspirate; Respiratory  Result Value Ref Range Status   Specimen Description   Final    TRACHEAL ASPIRATE Performed at [place] [address] [place], [place] [address]    Special Requests   Final    Normal Performed at [facility], [place] [address], [place], [place] [address]    Gram Stain   Final    RARE WBC PRESENT, PREDOMINANTLY PMN ABUNDANT GRAM NEGATIVE RODS Performed at [facility], [place] [address] [place],  [address]    Culture ABUNDANT PSEUDOMONAS AERUGINOSA  Final   Report Status 03/11/2019 FINAL  Final   Organism ID, Bacteria PSEUDOMONAS AERUGINOSA  Final      Susceptibility   Pseudomonas aeruginosa - MIC*    CEFTAZIDIME 4 SENSITIVE Sensitive     CIPROFLOXACIN <=0.25 SENSITIVE Sensitive     GENTAMICIN <=1 SENSITIVE Sensitive     IMIPENEM 2 SENSITIVE Sensitive     PIP/TAZO 64 SENSITIVE Sensitive     CEFEPIME <=1 SENSITIVE Sensitive     * ABUNDANT PSEUDOMONAS AERUGINOSA      Radiology Studies: No results found.  Scheduled Meds: . artificial tears  1 application Both Eyes Y1P  . chlorhexidine gluconate (MEDLINE KIT)  15 mL Mouth Rinse BID  . Chlorhexidine Gluconate Cloth  6 each Topical Daily  . clonazePAM  2 mg Per Tube Q8H  . enoxaparin (LOVENOX) injection  0.5 mg/kg  Subcutaneous BID  . feeding supplement (PRO-STAT SUGAR FREE 64)  60 mL Per Tube TID  . free water  300 mL Per Tube Q6H  . insulin aspart  0-20 Units Subcutaneous Q4H  . insulin aspart  6 Units Subcutaneous Q4H  . mouth rinse  15 mL Mouth Rinse 10 times per day  . methylPREDNISolone (SOLU-MEDROL) injection  20 mg Intravenous Daily   Followed by  . [START ON 03/14/2019] methylPREDNISolone (SOLU-MEDROL) injection  10 mg Intravenous Daily  . multivitamin with minerals  1 tablet Per Tube Daily  . oxyCODONE  10 mg Oral Q6H  . pantoprazole sodium  40 mg Per Tube Daily  . polyethylene glycol  17 g Per Tube BID  . sodium chloride flush  3 mL Intravenous Q12H  . [START ON 03/13/2019] pamrevlumab or placebo  35 mg/kg Intravenous Once  . traZODone  100 mg Oral QHS   Continuous Infusions: . sodium chloride    . sodium chloride    . dextrose    .  feeding supplement (VITAL 1.5 CAL) 1,000 mL (03/11/19 1639)  . HYDROmorphone 2.5 mg/hr (03/12/19 1200)  . ketamine (KETALAR) Adult IV Infusion 1 mg/kg/hr (03/12/19 1231)  . norepinephrine (LEVOPHED) Adult infusion    . piperacillin-tazobactam (ZOSYN)  IV Stopped (03/12/19 0955)  . propofol (DIPRIVAN) infusion 25 mcg/kg/min (03/12/19 1200)     LOS: 27 days   Time spent: 35 minutes.  Patrecia Pour, MD [facility] [email or web address] Password [facility] 03/12/2019, 2:24 PM

## 2019-03-12 NOTE — Progress Notes (Signed)
Pt's granddaughter Apolonio Schneiders called and was updated on pt's condition. This nurse answered questions and attempted to transfer call mult times to mobile phone but was unsuccessful. Granddaughter called twice. The second time was around 2230 when this nurse tried to transfer the call to the mobile phone. Call was disconnected and granddaughter didn't call back

## 2019-03-12 NOTE — Progress Notes (Signed)
Spoke with patient's granddaughter and wife. Updated both on condition. Clayton Sawyer

## 2019-03-12 NOTE — Progress Notes (Signed)
Pt proned at this time with 2 RT's and 3-4 RN's present.  RT made "Proning pillow" for pt as a trial if it will be beneficial.  At this time, no presssure noted on pt's face and tube is easily accessible.  RT will continue to monitor.

## 2019-03-13 ENCOUNTER — Inpatient Hospital Stay (HOSPITAL_COMMUNITY): Payer: Medicare Other

## 2019-03-13 LAB — POCT I-STAT 7, (LYTES, BLD GAS, ICA,H+H)
Acid-Base Excess: 13 mmol/L — ABNORMAL HIGH (ref 0.0–2.0)
Acid-Base Excess: 15 mmol/L — ABNORMAL HIGH (ref 0.0–2.0)
Bicarbonate: 41.8 mmol/L — ABNORMAL HIGH (ref 20.0–28.0)
Bicarbonate: 44 mmol/L — ABNORMAL HIGH (ref 20.0–28.0)
Calcium, Ion: 1.13 mmol/L — ABNORMAL LOW (ref 1.15–1.40)
Calcium, Ion: 1.21 mmol/L (ref 1.15–1.40)
HCT: 34 % — ABNORMAL LOW (ref 39.0–52.0)
HCT: 37 % — ABNORMAL LOW (ref 39.0–52.0)
Hemoglobin: 11.6 g/dL — ABNORMAL LOW (ref 13.0–17.0)
Hemoglobin: 12.6 g/dL — ABNORMAL LOW (ref 13.0–17.0)
O2 Saturation: 85 %
O2 Saturation: 81 %
Patient temperature: 98.9
Patient temperature: 37.2
Potassium: 3.7 mmol/L (ref 3.5–5.1)
Potassium: 3.7 mmol/L (ref 3.5–5.1)
Sodium: 150 mmol/L — ABNORMAL HIGH (ref 135–145)
Sodium: 150 mmol/L — ABNORMAL HIGH (ref 135–145)
TCO2: 44 mmol/L — ABNORMAL HIGH (ref 22–32)
TCO2: 46 mmol/L — ABNORMAL HIGH (ref 22–32)
pCO2 arterial: 76.9 mmHg (ref 32.0–48.0)
pCO2 arterial: 81.6 mmHg (ref 32.0–48.0)
pH, Arterial: 7.343 — ABNORMAL LOW (ref 7.350–7.450)
pH, Arterial: 7.341 — ABNORMAL LOW (ref 7.350–7.450)
pO2, Arterial: 57 mmHg — ABNORMAL LOW (ref 83.0–108.0)
pO2, Arterial: 51 mmHg — ABNORMAL LOW (ref 83.0–108.0)

## 2019-03-13 LAB — BASIC METABOLIC PANEL
Anion gap: 8 (ref 5–15)
BUN: 98 mg/dL — ABNORMAL HIGH (ref 8–23)
CO2: 42 mmol/L — ABNORMAL HIGH (ref 22–32)
Calcium: 8.3 mg/dL — ABNORMAL LOW (ref 8.9–10.3)
Chloride: 103 mmol/L (ref 98–111)
Creatinine, Ser: 1.11 mg/dL (ref 0.61–1.24)
GFR calc Af Amer: >60 mL/min (ref >60)
GFR calc non Af Amer: >60 mL/min (ref >60)
Glucose, Bld: 98 mg/dL (ref 70–99)
Potassium: 3.8 mmol/L (ref 3.5–5.1)
Sodium: 153 mmol/L — ABNORMAL HIGH (ref 135–145)

## 2019-03-13 LAB — GLUCOSE, CAPILLARY
Glucose-Capillary: 93 mg/dL (ref 70–99)
Glucose-Capillary: 126 mg/dL — ABNORMAL HIGH (ref 70–99)
Glucose-Capillary: 144 mg/dL — ABNORMAL HIGH (ref 70–99)
Glucose-Capillary: 177 mg/dL — ABNORMAL HIGH (ref 70–99)
Glucose-Capillary: 128 mg/dL — ABNORMAL HIGH (ref 70–99)
Glucose-Capillary: 136 mg/dL — ABNORMAL HIGH (ref 70–99)

## 2019-03-13 LAB — TRIGLYCERIDES: Triglycerides: 170 mg/dL — ABNORMAL HIGH (ref <150)

## 2019-03-13 LAB — PROTIME-INR
INR: 0.9 (ref 0.8–1.2)
Prothrombin Time: 12.5 seconds (ref 11.4–15.2)

## 2019-03-13 LAB — MAGNESIUM: Magnesium: 2.6 mg/dL — ABNORMAL HIGH (ref 1.7–2.4)

## 2019-03-13 MED ORDER — COLLAGENASE 250 UNIT/GM EX OINT
TOPICAL_OINTMENT | Freq: Every day | CUTANEOUS | Status: DC
  Administered 2019-03-13 – 2019-03-16 (×3): via TOPICAL
  Filled 2019-03-13: qty 30

## 2019-03-13 MED ORDER — OXYCODONE HCL 5 MG PO TABS
10.0000 mg | ORAL_TABLET | Freq: Four times a day (QID) | ORAL | Status: DC
  Administered 2019-03-13 – 2019-03-16 (×11): 10 mg
  Filled 2019-03-13 (×11): qty 2

## 2019-03-13 MED ORDER — FUROSEMIDE 10 MG/ML IJ SOLN
40.0000 mg | Freq: Once | INTRAMUSCULAR | Status: AC
  Administered 2019-03-13: 40 mg via INTRAVENOUS
  Filled 2019-03-13: qty 4

## 2019-03-13 MED ORDER — GUAIFENESIN-CODEINE 100-10 MG/5ML PO SOLN: 10.0000 mL | Freq: Every evening | ORAL | Status: DC | PRN

## 2019-03-13 MED ORDER — FUROSEMIDE 10 MG/ML IJ SOLN
60.0000 mg | Freq: Once | INTRAMUSCULAR | Status: AC
  Administered 2019-03-13: 60 mg via INTRAVENOUS
  Filled 2019-03-13: qty 6

## 2019-03-13 MED ORDER — METOPROLOL TARTRATE 5 MG/5ML IV SOLN
2.5000 mg | Freq: Once | INTRAVENOUS | Status: AC
  Administered 2019-03-13: 05:00:00 2.5 mg via INTRAVENOUS
  Filled 2019-03-13: qty 5

## 2019-03-13 MED ORDER — METOPROLOL TARTRATE 5 MG/5ML IV SOLN
10.0000 mg | Freq: Four times a day (QID) | INTRAVENOUS | Status: DC
  Administered 2019-03-13 (×2): 10 mg via INTRAVENOUS
  Administered 2019-03-13: 14:00:00 5 mg via INTRAVENOUS
  Administered 2019-03-14 – 2019-03-16 (×8): 10 mg via INTRAVENOUS
  Filled 2019-03-13 (×11): qty 10

## 2019-03-13 NOTE — Progress Notes (Signed)
NAME:  Clayton Sawyer, MRN:  [id number], DOB:  1941-05-12, LOS: 65 ADMISSION DATE:  02/04/2019, CONSULTATION DATE: February 13, 2019 REFERRING MD: Dr. 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 Hyperlipidemia History of prostate cancer Obstructive sleep apnea  Significant Hospital Events   7/15 Admit, transfer to [facility] 7/16 enrolled in fibrinogen research drug  7/27 PCCM consulted again for worsening oxygenation, pneumothorax on right 7/28 transferred to the intensive care unit, full code, intubated, right-sided chest tube 7/31 worsening vent settings, severe hypoxemia, high sedation needs 8/2 Worsening ventilator dyssynchrony overnight when attempted to stop Precedex, started bivalrudin 8/4 made DNR, worsening oxygen needs, ventilator synchrony 8/5 family requested transfer to [facility], [place] made call, the did not accept 8/6 palliative care consult. Family declined 8/7 Responsive to increased PEEP 8/8-8/11 Proned with improved oxygenation 8/12-Proned this morning on FIO2 50 and PEEP 10   Consults:  Pulmonary and critical care medicine  Procedures:  ETT 7/28 CVC 7/28 Pigtail R 7/28  Significant Diagnostic Tests:    Micro Data:  7/10, 7/15 SARS-CoV-2 positive 7/16 blood > coag neg staph in 1/4 7/31 blood >  7/31 resp > enterobacter, pseudomonas 8/7 resp > pseudomonas  Antimicrobials/COVID treatment  Ceftriaxone July 14 Azithromycin July 15 Remdesivir July 15 > July 19 Actemra July 15, July 19 Vancomycin July 18, July 19 Solumedrol July 14 Pamrevlumab vs placebo, next scheduled infusion July 29 Cefepime 7/31>   Interim history/subjective:   Proned on FIO2 50 and PEEP 10 with sats 89-96%  Objective   Blood pressure (!) 135/55, pulse 89, temperature 99 F (37.2  C), temperature source Oral, resp. rate (!) 32, height 5\' 9"  (1.753 m), weight 107.7 kg, SpO2 90 %.    Vent Mode: PRVC FiO2 (%):  [50 %-60 %] 50 % Set Rate:  [32 bmp] 32 bmp Vt Set:  [560 mL] 560 mL PEEP:  [10 QQV95-63 cmH20] 10 cmH20 Plateau Pressure:  [33 cmH20-42 cmH20] 33 cmH20   Intake/Output Summary (Last 24 hours) at 03/13/2019 2020 Last data filed at 03/13/2019 1800 Gross per 24 hour  Intake 3307.58 ml  Output 5545 ml  Net -2237.42 ml   Filed Weights   03/09/19 0500 03/13/19 0500  Weight: 108.8 kg 107.7 kg   Examination: General:  In bed on vent HENT: NCAT ETT in place PULM: CTA B, vent supported breathing, right chest tube in place without evidence of air leak CV: RRR, no mgr GI: BS+, soft, nontender MSK: normal bulk and tone Neuro: sedated on vent Skin: Maculopapular rash located on upper back, sparing buttocks and extremities, unstageable sacral ulcer on buttocks  Resolved Hospital Problem list    Assessment & Plan:  ARDS due to COVID-19 pneumonia: C/b PTX and recurrent Pseudomonas HCAP. Vent Day 13  Plan Continue mechanical ventilation per ARDS protocol Target TVol 6-8cc/kgIBW Target Plateau Pressure < 30cm H20 Target driving pressure less than 15 cm of water Target PaO2 55-65: titrate PEEP/FiO2 per protocol As long as PaO2 to FiO2 ratio is less than 1:150 position in prone position for 16 hours a day Check CVP daily if CVL in place Target CVP less than 4, diurese as necessary Ventilator associated pneumonia prevention protocol Proning protocol with intermittent paralytics 8/12: Diurese. ABG in am  Right sided pneumothorax status post 14 French pigtail chest tube catheter  Continue chest tube to suction until extubated CXR PRN  HCAP: pseudomonas and enterobacter: Repeat culture with abundant PA: favor active infection vs colonization in setting of worsening leukocytosis and persistent hypoxemia S/p Cefepime x 7 days Continue Zosyn for 7 day course.   Agitation requiring sedation: episodes of dyssynchrony, fentanyl tachyphylaxis? Continue sedation: kentamine, dilaudid and propofol for goal RASS -4 or -5 Intermittent paralytic protocol for proning Continue oxycodone, clonazepam  Thromboctytopenia: not HITT, resolved Continue lovenox 0.5mg  sq bid. Monitor for bleeding Taper prednisone off  GOC:  Palliative Care consulted. Family requested transfer to another facility which was attempted on 8/5 however request for transfer was denied. Family wishes to remain aggressive including pursuing tracheostomy.  Reasonable to pursue trial of proning for single organ injury though his status remains critical in setting of prolonged illness. Prognosis remains guarded.  Best practice:  Diet: tube feeding Pain/Anxiety/Delirium protocol (if indicated): yes, see above VAP protocol (if indicated): yes DVT prophylaxis: SCD GI prophylaxis: Pantoprazole for stress ulcer prophylaxis Glucose control: per TRH Mobility: bed rest Code Status: DNR Family Communication: per TRH.  Disposition: remain in ICU  Labs   CBC: Recent Labs  Lab 03/08/19 0443 03/09/19 0446  03/10/19 0500  03/11/19 0500 03/11/19 0555 03/12/19 0504 03/12/19 1054 03/13/19 0109 03/13/19 0852  WBC 14.3* 8.5  --  12.5*  --  13.4*  --  15.7*  --   --   --   NEUTROABS 13.4* 6.5  --  10.9*  --  12.4*  --  13.9*  --   --   --   HGB 13.4 11.2*   < > 12.3*   < > 12.3* 12.9* 11.8* 12.2* 11.6* 12.6*  HCT 45.7 36.9*   < > 42.4   < > 40.4 38.0* 39.3 36.0* 34.0* 37.0*  MCV 104.3* 105.4*  --  106.3*  --  105.5*  --  105.6*  --   --   --   PLT 125* 107*  --  150  --  135*  --  162  --   --   --    < > = values in this interval not displayed.    Basic Metabolic Panel: Recent Labs  Lab 03/09/19 0446  03/10/19 0500 03/10/19 0703  03/11/19 0500  03/12/19 0504 03/12/19 1054 03/13/19 0109 03/13/19 0545 03/13/19 0852  NA 145   < > 149*  --    < > 149*   < > 149* 147* 150* 153* 150*   K 4.2   < > 5.0  --    < > 4.7   < > 4.3 4.1 3.7 3.8 3.7  CL 99  --  101  --   --  102  --  101  --   --  103  --   CO2 38*  --  40*  --   --  39*  --  38*  --   --  42*  --   GLUCOSE 138*  --  166*  --   --  161*  --  151*  --   --  98  --   BUN 75*  --  102*  --   --  99*  --  95*  --   --  98*  --   CREATININE 1.08  --  1.63*  --   --  1.33*  --  1.29*  --   --  1.11  --   CALCIUM 7.8*  --  8.2*  --   --  8.3*  --  8.1*  --   --  8.3*  --   MG  --   --   --  2.6*  --  2.8*  --   --   --   --  2.6*  --    < > = values in this interval not displayed.   GFR: Estimated Creatinine Clearance: 66.3 mL/min (by C-G formula based on SCr of 1.11 mg/dL). Recent Labs  Lab 03/09/19 0446 03/10/19 0500 03/11/19 0500 03/12/19 0504  WBC 8.5 12.5* 13.4* 15.7*    Liver Function Tests: Recent Labs  Lab 03/08/19 0443 03/09/19 0446 03/10/19 0500 03/11/19 0500 03/12/19 0504  AST 39 31 38 39 39  ALT 59* 49* 58* 53* 51*  ALKPHOS 55 43 60 68 75  BILITOT 1.0 0.7 0.5 0.7 0.6  PROT 5.4* 4.3* 5.0* 5.3* 5.1*  ALBUMIN 2.7* 2.3* 2.8* 2.6* 2.4*   No results for input(s): LIPASE, AMYLASE in the last 168 hours. No results for input(s): AMMONIA in the last 168 hours.  ABG    Component Value Date/Time   PHART 7.341 (L) 03/13/2019 0852   PCO2ART 81.6 (HH) 03/13/2019 0852   PO2ART 51.0 (L) 03/13/2019 0852   HCO3 44.0 (H) 03/13/2019 0852   TCO2 46 (H) 03/13/2019 0852   O2SAT 81.0 03/13/2019 0852     Coagulation Profile: Recent Labs  Lab 03/09/19 0446 03/10/19 0500 03/11/19 0500 03/12/19 0504 03/13/19 0545  INR 1.1 1.0 1.0 1.0 0.9    Cardiac Enzymes: No results for input(s): CKTOTAL, CKMB, CKMBINDEX, TROPONINI in the last 168 hours.  HbA1C: Hemoglobin A1C  Date/Time Value Ref Range Status  09/22/2012 02:49 AM 5.9 4.2 - 6.3 % Final    Comment:    The American Diabetes Association recommends that a primary goal of therapy should be <7% and that physicians should reevaluate the treatment  regimen in patients with HbA1c values consistently >8%.     CBG: Recent Labs  Lab 03/12/19 2326 03/13/19 0352 03/13/19 0804 03/13/19 1234 03/13/19 1605  GLUCAP 144* 93 126* 144* 177*     Critical care time: 36 minutes    The patient is critically ill with multiple organ systems failure and requires high complexity decision making for assessment and support, frequent evaluation and titration of therapies, application of advanced monitoring technologies and extensive interpretation of multiple databases.   Discussed and co-managed patient care with PCCM-Hospitalist. Coordinated care with RT, RN and pharmacist.  Rodman Pickle, M.D. [facility] Pulmonary/Critical Care Medicine 03/13/2019 8:25 PM  Pager: [phone] After hours pager: [phone]

## 2019-03-13 NOTE — Progress Notes (Signed)
[place] TEAM 1 - Stepdown/ICU TEAM  Clayton Sawyer  JKK:[id number] DOB: 1941-07-18 DOA: 02/12/2019 PCP: Jodi Marble, MD    Brief Narrative:  78 year old with a history of HTN, CAD status post PCI, pacemaker placement, and prostate cancer who presented to [facility] 7/15 with shortness of breath and was found to be in acute hypoxic respiratory failure with COVID pneumonia.  He was admitted to [place] from the ED. He has been treated with steroids, Remdesivir, and Actemra x2.  After improving he was able to be transferred out of the ICU 7/23 but subsequently decompensated.  He returned to the ICU on 728 where he required intubation and chest tube placement for a pneumothorax.  Significant Events: 7/15 admit to [facility] via [facility] ED 7/16 PCCM consulted - enrolled in fibrogen research study - [place] utilized  7/23 transfer out of ICU  7/26 resp decline - refused transfer back to ICU  7/27 worsening oxygenation- new right-sided pneumothorax 7/28 return to ICU - intubated - right-sided chest tube placed 8/2 worsening ventilator dyssynchrony 8/4 transition to DNR/NO CODE BLUE 8/5 family requested transfer to [facility] who refused to accept the patient 8/6 Palliative Care consulted -family declined  COVID-19 specific Treatment: Steroids Remdesivir Actemra x2  Subjective: The patient is in the prone position on the ventilator.  There is no evidence of ventilator dyssynchrony.  There is no evidence of agitation anxiety or uncontrolled pain.  He was having epistaxis earlier today but this appears to have essentially resolved at the time of my exam.  Assessment & Plan:  COVID pneumonia - acute hypoxic respiratory failure Has completed treatment with Remdesivir and Actemra -nearing completion of steroid treatment -remains ventilator dependent -ventilator management per PCCM -appears to be responding well to prone ventilation -continue to diurese as able but  presently holding due to hypernatremia and uremia  Septic shock - Enterobacter and recurrent Pseudomonas pneumonia Completed 7 days of cefepime -blood cultures 7/31 -now off pressor support -continue Zosyn through 8/15  Right-sided pneumothorax Chest tube placed 7/28 -management per PCCM -plan is to keep to suction until extubated  Acute renal failure Creatinine is stable at this time  SVT Normal sinus rhythm at time of exam -add beta-blocker -watch for evidence of anxiety/distress  Hyperkalemia Resolved  Transaminitis Follow-up LFTs in a.m.  Hyperglycemia due to steroids CBG well controlled at this time with tapering dose of steroids  Hypernatremia Continue with gentle free water per IV and enteral  Thrombocytopenia Follow-up in a.m.  Epistaxis Appears to have been self-limited at this time -follow closely -may need to consider adjusting dose of Lovenox if persists  Stage I deep tissue injury Not present on admission -first noted 8/1 Pressure Injury 03/02/19 Sacrum Deep Tissue Injury - Purple or maroon localized area of discolored intact skin or blood-filled blister due to damage of underlying soft tissue from pressure and/or shear. (Active)  03/02/19 1730  Location: Sacrum  Location Orientation:   Staging: Deep Tissue Injury - Purple or maroon localized area of discolored intact skin or blood-filled blister due to damage of underlying soft tissue from pressure and/or shear.  Wound Description (Comments):   Present on Admission: No    Coronary artery disease status post PCI  Anxiety Appears to be well controlled at this time  Prostate cancer Quiescent  Obesity - Estimated body mass index is 35.06 kg/m as calculated from the following:   Height as of this encounter: _0  (1.753 m).   Weight  as of this encounter: 107.7 kg.   DVT prophylaxis: Lovenox intermediate dose Code Status: DNR - NO CODE  Family Communication:  Disposition Plan: Prognosis remains very  guarded  Consultants:  PCCM Palliative Care  Antimicrobials:  Vancomycin 7/18 - 7/19, 7/31 - 8/1 Cefepime 7/31 - 8/6 Zosyn 8/9 >   Objective: Blood pressure (!) 155/54, pulse 68, temperature 98.8 F (37.1 C), temperature source Oral, resp. rate (!) 32, height _0  (1.753 m), weight 107.7 kg, SpO2 (!) 89 %.  Intake/Output Summary (Last 24 hours) at 03/13/2019 1110 Last data filed at 03/13/2019 0700 Gross per 24 hour  Intake 2932.96 ml  Output 3305 ml  Net -372.04 ml   Filed Weights   03/09/19 0500 03/13/19 0500  Weight: 108.8 kg 107.7 kg    Examination: General: No acute respiratory distress in prone position sedated Lungs: Diffuse crackles across posterior fields Cardiovascular: Regular rate and rhythm without murmur  Abdomen: Soft, no evidence of distention in prone position, bowel sounds positive but hypoactive Extremities: 1+ lower extremity edema  CBC: Recent Labs  Lab 03/10/19 0500  03/11/19 0500  03/12/19 0504 03/12/19 1054 03/13/19 0109 03/13/19 0852  WBC 12.5*  --  13.4*  --  15.7*  --   --   --   NEUTROABS 10.9*  --  12.4*  --  13.9*  --   --   --   HGB 12.3*   < > 12.3*   < > 11.8* 12.2* 11.6* 12.6*  HCT 42.4   < > 40.4   < > 39.3 36.0* 34.0* 37.0*  MCV 106.3*  --  105.5*  --  105.6*  --   --   --   PLT 150  --  135*  --  162  --   --   --    < > = values in this interval not displayed.   Basic Metabolic Panel: Recent Labs  Lab 03/10/19 0703  03/11/19 0500  03/12/19 0504  03/13/19 0109 03/13/19 0545 03/13/19 0852  NA  --    < > 149*   < > 149*   < > 150* 153* 150*  K  --    < > 4.7   < > 4.3   < > 3.7 3.8 3.7  CL  --   --  102  --  101  --   --  103  --   CO2  --   --  39*  --  38*  --   --  42*  --   GLUCOSE  --   --  161*  --  151*  --   --  98  --   BUN  --   --  99*  --  95*  --   --  98*  --   CREATININE  --   --  1.33*  --  1.29*  --   --  1.11  --   CALCIUM  --   --  8.3*  --  8.1*  --   --  8.3*  --   MG 2.6*  --  2.8*  --   --   --    --  2.6*  --    < > = values in this interval not displayed.   GFR: Estimated Creatinine Clearance: 66.3 mL/min (by C-G formula based on SCr of 1.11 mg/dL).  Liver Function Tests: Recent Labs  Lab 03/09/19 0446 03/10/19 0500 03/11/19 0500 03/12/19 0504  AST 31 38  39 39  ALT 49* 58* 53* 51*  ALKPHOS 43 60 68 75  BILITOT 0.7 0.5 0.7 0.6  PROT 4.3* 5.0* 5.3* 5.1*  ALBUMIN 2.3* 2.8* 2.6* 2.4*    Coagulation Profile: Recent Labs  Lab 03/09/19 0446 03/10/19 0500 03/11/19 0500 03/12/19 0504 03/13/19 0545  INR 1.1 1.0 1.0 1.0 0.9    HbA1C: Hemoglobin A1C  Date/Time Value Ref Range Status  09/22/2012 02:49 AM 5.9 4.2 - 6.3 % Final    Comment:    The American Diabetes Association recommends that a primary goal of therapy should be <7% and that physicians should reevaluate the treatment regimen in patients with HbA1c values consistently >8%.     CBG: Recent Labs  Lab 03/12/19 1630 03/12/19 1941 03/12/19 2326 03/13/19 0352 03/13/19 0804  GLUCAP 145* 163* 144* 93 126*    Recent Results (from the past 240 hour(s))  Culture, blood (routine x 2)     Status: None (Preliminary result)   Collection Time: 03/08/19 10:45 PM   Specimen: BLOOD  Result Value Ref Range Status   Specimen Description   Final    BLOOD RIGHT THUMB Performed at [facility] [address] [place], [place] [address]    Special Requests   Final    BOTTLES DRAWN AEROBIC AND ANAEROBIC Blood Culture adequate volume Performed at [facility] [address] [place], [place] [address]    Culture   Final    NO GROWTH 3 DAYS Performed at [facility], [place] [address] [place], [place] [address]    Report Status PENDING  Incomplete  Culture, blood (routine x 2)     Status: None (Preliminary result)   Collection Time: 03/08/19 10:52 PM   Specimen: BLOOD  Result Value Ref Range Status   Specimen Description   Final    BLOOD RIGHT INDEX FINGER  Performed at [facility] [address] [place], [place] [address]    Special Requests   Final    BOTTLES DRAWN AEROBIC AND ANAEROBIC Blood Culture adequate volume Performed at [facility] [address], [place], [place] [address]    Culture   Final    NO GROWTH 3 DAYS Performed at [facility], [place] [address], [place], [place] [address]    Report Status PENDING  Incomplete  Culture, respiratory     Status: None   Collection Time: 03/08/19 11:30 PM   Specimen: Tracheal Aspirate; Respiratory  Result Value Ref Range Status   Specimen Description   Final    TRACHEAL ASPIRATE Performed at [facility] [address] [place], [place] [address]    Special Requests   Final    Normal Performed at [facility], [place] [address], [place], [place] [address]    Gram Stain   Final    RARE WBC PRESENT, PREDOMINANTLY PMN ABUNDANT GRAM NEGATIVE RODS Performed at [facility], [place] [address] [place],  [address]    Culture ABUNDANT PSEUDOMONAS AERUGINOSA  Final   Report Status 03/11/2019 FINAL  Final   Organism ID, Bacteria PSEUDOMONAS AERUGINOSA  Final      Susceptibility   Pseudomonas aeruginosa - MIC*    CEFTAZIDIME 4 SENSITIVE Sensitive     CIPROFLOXACIN <=0.25 SENSITIVE Sensitive     GENTAMICIN <=1 SENSITIVE Sensitive     IMIPENEM 2 SENSITIVE Sensitive     PIP/TAZO 64 SENSITIVE Sensitive     CEFEPIME <=1 SENSITIVE Sensitive     *  ABUNDANT PSEUDOMONAS AERUGINOSA     Scheduled Meds: . artificial tears  1 application Both Eyes U0R  . chlorhexidine gluconate (MEDLINE KIT)  15 mL Mouth Rinse BID  . Chlorhexidine Gluconate Cloth  6 each Topical Daily  . clonazePAM  2 mg Per Tube Q8H  . collagenase   Topical Daily  . enoxaparin (LOVENOX) injection  0.5 mg/kg Subcutaneous BID  . feeding supplement (PRO-STAT SUGAR FREE 64)  60 mL Per Tube TID  . free water  300 mL Per Tube Q6H  .  insulin aspart  0-20 Units Subcutaneous Q4H  . insulin aspart  6 Units Subcutaneous Q4H  . mouth rinse  15 mL Mouth Rinse 10 times per day  . [START ON 03/14/2019] methylPREDNISolone (SOLU-MEDROL) injection  10 mg Intravenous Daily  . multivitamin with minerals  1 tablet Per Tube Daily  . oxyCODONE  10 mg Oral Q6H  . pantoprazole sodium  40 mg Per Tube Daily  . polyethylene glycol  17 g Per Tube BID  . sodium chloride flush  3 mL Intravenous Q12H  . pamrevlumab or placebo  35 mg/kg Intravenous Once  . traZODone  100 mg Oral QHS   Continuous Infusions: . sodium chloride    . sodium chloride    . dextrose    . feeding supplement (VITAL 1.5 CAL) 1,000 mL (03/11/19 1639)  . HYDROmorphone 2.5 mg/hr (03/13/19 0700)  . ketamine (KETALAR) Adult IV Infusion 1 mg/kg/hr (03/13/19 0700)  . norepinephrine (LEVOPHED) Adult infusion    . piperacillin-tazobactam (ZOSYN)  IV 12.5 mL/hr at 03/13/19 0700  . propofol (DIPRIVAN) infusion Stopped (03/12/19 1828)     LOS: 28 days   Cherene Altes, MD [facility] Office  [phone] Pager - Text Page per Amion  If 7PM-7AM, please contact night-coverage per Amion 03/13/2019, 11:10 AM

## 2019-03-13 NOTE — Consult Note (Addendum)
[facility] Nurse wound consult note Patient receiving care in [facility] [place].  COVID +.  Consult completed remotely via Telehealth with assistance of primary RN, Casimer Bilis. Reason for Consult: deep tissue/unstageable PI Wound type: the upper medial gluteal fold and coccyx area has unstageable PI that is soft and black surrounded by maroon discoloration that is consistent with a DTPI. Pressure Injury POA: No Measurement: To be provided by the bedside RN in the flowsheet section Wound bed: black, soft, and maroon Drainage (amount, consistency, odor) minimal seen on the existing foam dressing Periwound: intact Dressing procedure/placement/frequency: Apply Santyl to medial gluteal fold/coccyx discolored area in a nickel thick layer. Cover with a saline moistened gauze, then dry gauze or ABD pad.  Change daily. Monitor the wound area(s) for worsening of condition such as: Signs/symptoms of infection,  Increase in size,  Development of or worsening of odor, Development of pain, or increased pain at the affected locations.  Notify the medical team if any of these develop.  Thank you for the consult.  Discussed plan of care with the bedside nurse.  [facility] [place] nurse will not follow at this time.  Please re-consult the [facility] team if needed.  Val Riles, RN, MSN, CWOCN, CNS-BC, pager [phone]

## 2019-03-13 NOTE — Progress Notes (Signed)
Pharmacy Antibiotic Note  Clayton Sawyer is a 78 y.o. male admitted on 02/24/2019 with recurrent pseudomonas pnuemonia.  Pharmacy has been consulted for Zosyn dosing. WBC mildly elevated. Tm 100. SCr has trended down to 1.11.  Plan: -Continue Zosyn 3.375 gm IV Q 8 hours -Monitor renal fx, cultures and clinical progress   Height: 5\' 9"  (175.3 cm) Weight: 237 lb 7 oz (107.7 kg) IBW/kg (Calculated) : 70.7  Temp (24hrs), Avg:99.3 F (37.4 C), Min:98.8 F (37.1 C), Max:100 F (37.8 C)  Recent Labs  Lab 03/08/19 0443 03/09/19 0446 03/10/19 0500 03/11/19 0500 03/12/19 0504 03/13/19 0545  WBC 14.3* 8.5 12.5* 13.4* 15.7*  --   CREATININE 1.01 1.08 1.63* 1.33* 1.29* 1.11    Estimated Creatinine Clearance: 66.3 mL/min (by C-G formula based on SCr of 1.11 mg/dL).    Allergies  Allergen Reactions  . Lisinopril Cough  . Losartan Rash  . Statins Other (See Comments)    Body aches    Antimicrobials this admission: 7/15 Azith/CTX x1 ([facility]) 7/15 Remdesivir >> 7/19 7/15 Actemra x1 ([facility]) 7/18 Vanc >> 7/19, resumed 7/31 >> 8/1 7/19 Actemra x 1 7/31 Cefepime >> 8/6 8/9 Zosyn >>   Dose adjustments this admission:   Microbiology results: 7/15 SARS Coronavirus 2: positive 7/15 BCx [facility]): 1/4 bottles CONS +mec A - likely contaminant 7/16 BCx: 1/4 bottles staph hominis (likely contaminant again) 7/15 MRSA PCR: neg 7/31 MRSA PCR:  negative 7/31 BCx: ngtd 7/31 TA: culture: Enterobacter cloacae (pan S except R Ancef); pseudomonas aeruginosa 8/7 TA >> abundant pseudomonas  8/7 BCx >> ngtd    Thank you for allowing pharmacy to be a part of this patient's care.  Gretta Arab PharmD, BCPS Clinical pharmacist phone 7am- 5pm: [phone] 03/13/2019 2:10 PM

## 2019-03-13 NOTE — Research (Signed)
Title: [id number] (Fibro[employer] Study) Randomized, Double-Blind, Placebo-Controlled Phase 2 Study of the Efficacy and Safety of Intravenous Pamrevlumab, a Monoclonal Antibody Against Connective Tissue Growth Factor (CTGF), in Hospitalized Patients with Acute COVID-19 Disease .  alized Patients with Acute COVID-19 Disease  Protocol #: [id number], Clinical Trials #: [id number] Sponsor: www.fibrogen.com  ([place], [place], [place]) ............................................................ S    Last few hours BP spike up -> Lopressor pending per bedside RN Heather BP 1:48 PM 03/13/2019 - 190/70 with MAP 91. HR 105 sinus, Vent 60%, On diladudid and ketamine gtt Not on pressors  A - research - infusion assessment  P - ok to infuse given acceptable MAP    SIGNATURE    Dr. Brand Males, M.D., F.C.C.P, ACRP-CPI Pulmonary and Critical Care Medicine Research Investigator, [facility] Staff Physician, [place] Director - Interstitial Lung Disease  Program  [facility] [place], [place], [address]  Pager: [phone], If no answer or between  15:00h - 7:00h: call [phone] Telephone: [phone]  1:49 PM 03/13/2019

## 2019-03-13 NOTE — Progress Notes (Signed)
Pt's wife and granddaughter were updated with plan of care and pt's condition. Pt has had several episodes of paroxysmal Afib with rates in the 130s. The on call MD with [facility] was notified and orders were received. Pt was turned prone and tolerated well.

## 2019-03-13 NOTE — Progress Notes (Signed)
VAST consulted to place midline. Called unit and spoke with pt's nurse who stated pt needs a PIV for research medication; he advised that research medication has been out of refrigerator for too long and can no longer be given today, so IV access is not emergent at this time. No specific time to give medication tomorrow, but scheduled for 1000.  Educated about vein preservation and waiting until closer to medication administration time to place PIV. Pt's nurse verbalized understanding. Advised pt's nurse to give report for night shift nurse to place VAST consult in am around 0500, and this RN will report to VAST regarding pt's need for IV tomorrow morning.

## 2019-03-13 NOTE — Progress Notes (Signed)
[facility] [place] Progress Note Patient Name: Clayton Sawyer DOB: [birth year] MRN: [id number]   Date of Service  03/13/2019  HPI/Events of Note  AFIB/AFLUTTER - Ventricular rate = 130-140. BP = 124/56. Has been responsive to B-Blocker in the past.   eICU Interventions  Will order: 1. Metoprolol 2.5 mg IV now. 2. BMP and Mg++ level STAT.      Intervention Category Major Interventions: Electrolyte abnormality - evaluation and management  [name] 03/13/2019, 4:25 AM

## 2019-03-14 LAB — POCT I-STAT 7, (LYTES, BLD GAS, ICA,H+H)
Acid-Base Excess: 18 mmol/L — ABNORMAL HIGH (ref 0.0–2.0)
Acid-Base Excess: 17 mmol/L — ABNORMAL HIGH (ref 0.0–2.0)
Bicarbonate: 46.4 mmol/L — ABNORMAL HIGH (ref 20.0–28.0)
Bicarbonate: 44.8 mmol/L — ABNORMAL HIGH (ref 20.0–28.0)
Calcium, Ion: 1.19 mmol/L (ref 1.15–1.40)
Calcium, Ion: 1.19 mmol/L (ref 1.15–1.40)
HCT: 39 % (ref 39.0–52.0)
HCT: 32 % — ABNORMAL LOW (ref 39.0–52.0)
Hemoglobin: 13.3 g/dL (ref 13.0–17.0)
Hemoglobin: 10.9 g/dL — ABNORMAL LOW (ref 13.0–17.0)
O2 Saturation: 97 %
O2 Saturation: 87 %
Patient temperature: 102.6
Patient temperature: 38.3
Potassium: 3.3 mmol/L — ABNORMAL LOW (ref 3.5–5.1)
Potassium: 3.7 mmol/L (ref 3.5–5.1)
Sodium: 152 mmol/L — ABNORMAL HIGH (ref 135–145)
Sodium: 152 mmol/L — ABNORMAL HIGH (ref 135–145)
TCO2: 49 mmol/L — ABNORMAL HIGH (ref 22–32)
TCO2: 47 mmol/L — ABNORMAL HIGH (ref 22–32)
pCO2 arterial: 80.2 mmHg (ref 32.0–48.0)
pCO2 arterial: 79.5 mmHg (ref 32.0–48.0)
pH, Arterial: 7.379 (ref 7.350–7.450)
pH, Arterial: 7.364 (ref 7.350–7.450)
pO2, Arterial: 111 mmHg — ABNORMAL HIGH (ref 83.0–108.0)
pO2, Arterial: 62 mmHg — ABNORMAL LOW (ref 83.0–108.0)

## 2019-03-14 LAB — GLUCOSE, CAPILLARY
Glucose-Capillary: 102 mg/dL — ABNORMAL HIGH (ref 70–99)
Glucose-Capillary: 105 mg/dL — ABNORMAL HIGH (ref 70–99)
Glucose-Capillary: 166 mg/dL — ABNORMAL HIGH (ref 70–99)
Glucose-Capillary: 210 mg/dL — ABNORMAL HIGH (ref 70–99)

## 2019-03-14 LAB — CBC WITH DIFFERENTIAL/PLATELET
Abs Immature Granulocytes: 0.22 10*3/uL — ABNORMAL HIGH (ref 0.00–0.07)
Basophils Absolute: 0 10*3/uL (ref 0.0–0.1)
Basophils Relative: 0 %
Eosinophils Absolute: 0 10*3/uL (ref 0.0–0.5)
Eosinophils Relative: 0 %
HCT: 38 % — ABNORMAL LOW (ref 39.0–52.0)
Hemoglobin: 11.1 g/dL — ABNORMAL LOW (ref 13.0–17.0)
Immature Granulocytes: 1 %
Lymphocytes Relative: 3 %
Lymphs Abs: 0.6 10*3/uL — ABNORMAL LOW (ref 0.7–4.0)
MCH: 30.9 pg (ref 26.0–34.0)
MCHC: 29.2 g/dL — ABNORMAL LOW (ref 30.0–36.0)
MCV: 105.8 fL — ABNORMAL HIGH (ref 80.0–100.0)
Monocytes Absolute: 0.8 10*3/uL (ref 0.1–1.0)
Monocytes Relative: 4 %
Neutro Abs: 15.5 10*3/uL — ABNORMAL HIGH (ref 1.7–7.7)
Neutrophils Relative %: 92 %
Platelets: 180 10*3/uL (ref 150–400)
RBC: 3.59 MIL/uL — ABNORMAL LOW (ref 4.22–5.81)
RDW: 19.8 % — ABNORMAL HIGH (ref 11.5–15.5)
WBC: 17.1 10*3/uL — ABNORMAL HIGH (ref 4.0–10.5)
nRBC: 0.3 % — ABNORMAL HIGH (ref 0.0–0.2)

## 2019-03-14 LAB — COMPREHENSIVE METABOLIC PANEL
ALT: 37 U/L (ref 0–44)
AST: 41 U/L (ref 15–41)
Albumin: 2.1 g/dL — ABNORMAL LOW (ref 3.5–5.0)
Alkaline Phosphatase: 83 U/L (ref 38–126)
Anion gap: 13 (ref 5–15)
BUN: 82 mg/dL — ABNORMAL HIGH (ref 8–23)
CO2: 41 mmol/L — ABNORMAL HIGH (ref 22–32)
Calcium: 8.3 mg/dL — ABNORMAL LOW (ref 8.9–10.3)
Chloride: 101 mmol/L (ref 98–111)
Creatinine, Ser: 1.17 mg/dL (ref 0.61–1.24)
GFR calc Af Amer: >60 mL/min (ref >60)
GFR calc non Af Amer: 59 mL/min — ABNORMAL LOW (ref >60)
Glucose, Bld: 105 mg/dL — ABNORMAL HIGH (ref 70–99)
Potassium: 3.6 mmol/L (ref 3.5–5.1)
Sodium: 155 mmol/L — ABNORMAL HIGH (ref 135–145)
Total Bilirubin: 0.5 mg/dL (ref 0.3–1.2)
Total Protein: 5.2 g/dL — ABNORMAL LOW (ref 6.5–8.1)

## 2019-03-14 LAB — CULTURE, BLOOD (ROUTINE X 2)
Culture: NO GROWTH
Culture: NO GROWTH
Special Requests: ADEQUATE
Special Requests: ADEQUATE

## 2019-03-14 LAB — TRIGLYCERIDES: Triglycerides: 142 mg/dL (ref <150)

## 2019-03-14 MED ORDER — POTASSIUM CHLORIDE 20 MEQ/15ML (10%) PO SOLN
40.0000 meq | Freq: Once | ORAL | Status: AC
  Administered 2019-03-14: 06:00:00 40 meq
  Filled 2019-03-14: qty 30

## 2019-03-14 MED ORDER — FREE WATER
300.0000 mL | Status: DC
  Administered 2019-03-14 – 2019-03-16 (×10): 300 mL

## 2019-03-14 MED ORDER — ENOXAPARIN SODIUM 60 MG/0.6ML ~~LOC~~ SOLN: 0.5000 mg/kg | Freq: Two times a day (BID) | SUBCUTANEOUS | Status: DC

## 2019-03-14 MED ORDER — STUDY - FIBROGEN-COVID - PAMREVLUMAB OR PLACEBO 10 MG/ML IV INFUSION (PI-RAMASWAMY)
35.0000 mg/kg | Freq: Once | INTRAVENOUS | Status: AC
  Administered 2019-03-14: 13:00:00 3700 mg via INTRAVENOUS
  Filled 2019-03-14: qty 370

## 2019-03-14 NOTE — Progress Notes (Signed)
Spoke with granddaughter Greggory Stallion, gave her a update on pt's current condition. Answered her questions and addressed her concerns.

## 2019-03-14 NOTE — Progress Notes (Signed)
Pt Chest tube was found to be on water seal. RN called and clarified with MD that the chest tube should connected to wall section per previous orders at -20 cm. The chest tube now at -20 cm of suction per orders. Chest tube dressing changed per protocol and there was large amount of sanguineous drainage on the dressing. New dressing was applied. Will continue to monitor pt.

## 2019-03-14 NOTE — Progress Notes (Signed)
[place] TEAM 1 - Stepdown/ICU TEAM  Janoah R Yaw  MRN:[id number] DOB: 04/12/1941 DOA: 02/01/2019 PCP: Tejan-Sie, S Ahmed, MD    Brief Narrative:  78-year-old with a history of HTN, CAD status post PCI, pacemaker placement, and prostate cancer who presented to [facility] 7/15 with shortness of breath and was found to be in acute hypoxic respiratory failure with COVID pneumonia.  He was admitted to [facility] from the ED. He has been treated with steroids, Remdesivir, and Actemra x2.  After improving he was able to be transferred out of the ICU 7/23 but subsequently decompensated.  He returned to the ICU on 728 where he required intubation and chest tube placement for a pneumothorax.  Significant Events: 7/15 admit to [facility] via [facility] ED 7/16 PCCM consulted - enrolled in fibrogen research study - [facility] utilized  7/23 transfer out of ICU  7/26 resp decline - refused transfer back to ICU  7/27 worsening oxygenation- new right-sided pneumothorax 7/28 return to ICU - intubated - right-sided chest tube placed 8/2 worsening ventilator dyssynchrony 8/4 transition to DNR/NO CODE BLUE 8/5 family requested transfer to [facility] who refused to accept the patient 8/6 Palliative Care consulted - family declined comfort care   COVID-19 specific Treatment: Steroids Remdesivir Actemra x2  Subjective: The patient developed recurrent fevers earlier this morning.  Though his oxygen requirements dropped as low as 50% yesterday he did suffer desaturations last night which required return to 100% support.  Attempts are being made to wean his oxygen support again today. It appears he may soon need a trach. He appeared comfortable in the supine position at the time of my visit.  His nurse reports that he continues to have intermittent epistaxis.  The patient was sedated at the time of my exam with no evidence of respiratory distress anxiety or uncontrolled pain.  Assessment & Plan:   COVID pneumonia - acute hypoxic respiratory failure Has completed treatment with Remdesivir and Actemra - nearing completion of steroid treatment - remains ventilator dependent - ventilator management per PCCM - will likely require tracheostomy soon  Septic shock - Enterobacter and recurrent Pseudomonas pneumonia (per trach aspirate cx) Completed 7 days of cefepime - now off pressor support - continue Zosyn through 8/15  Recurrent Fever Had fever to 102.6 early this morning - blood cultures x2 8/7 and 7/31 negative -no change in treatment for now and continue to follow  Right-sided pneumothorax Chest tube placed 7/28 - management per PCCM - plan is to keep to suction until extubated  Acute renal failure Creatinine is stable   SVT Continue beta-blocker  Transaminitis LFTs have normalized as of this morning  Recent Labs  Lab 03/09/19 0446 03/10/19 0500 03/11/19 0500 03/12/19 0504 03/14/19 0521  AST 31 38 39 39 41  ALT 49* 58* 53* 51* 37  ALKPHOS 43 60 68 75 83  BILITOT 0.7 0.5 0.7 0.6 0.5  PROT 4.3* 5.0* 5.3* 5.1* 5.2*  ALBUMIN 2.3* 2.8* 2.6* 2.4* 2.1*    Hyperglycemia due to steroids CBG well controlled   Hypernatremia Continue with free enteral water   Thrombocytopenia Follow-up in a.m.  Epistaxis Proving to be a recurring issue -discontinue Lovenox for now and monitor -utilize SCDs  Stage I deep tissue injury Not present on admission -first noted 8/1 Pressure Injury 03/02/19 Sacrum Deep Tissue Injury - Purple or maroon localized area of discolored intact skin or blood-filled blister due to damage of underlying soft tissue from pressure and/or shear. (Active)    03/02/19 1730  Location: Sacrum  Location Orientation:   Staging: Deep Tissue Injury - Purple or maroon localized area of discolored intact skin or blood-filled blister due to damage of underlying soft tissue from pressure and/or shear.  Wound Description (Comments):   Present on Admission: No     Coronary artery disease status post PCI No evidence of ACS  Anxiety Sedated on vent presently  Prostate cancer Quiescent  Obesity - Estimated body mass index is 34.84 kg/m as calculated from the following:   Height as of this encounter: 5' 9" (1.753 m).   Weight as of this encounter: 107 kg.   Macrocytosis Check B12 and folic acid levels   DVT prophylaxis: SCDs only due to hectic epistaxis Code Status: DNR - NO CODE  Family Communication: Spoke with wife via telephone Disposition Plan: Prognosis remains very guarded  Consultants:  PCCM Palliative Care  Antimicrobials:  Vancomycin 7/18 - 7/19, 7/31 - 8/1 Cefepime 7/31 - 8/6 Zosyn 8/9 >   Objective: Blood pressure (!) 130/54, pulse 60, temperature (!) 101.7 F (38.7 C), temperature source Oral, resp. rate (!) 32, height 5' 9" (1.753 m), weight 107 kg, SpO2 92 %.  Intake/Output Summary (Last 24 hours) at 03/14/2019 0920 Last data filed at 03/14/2019 0600 Gross per 24 hour  Intake 1720.82 ml  Output 5200 ml  Net -3479.18 ml   Filed Weights   03/09/19 0500 03/13/19 0500 03/14/19 0500  Weight: 108.8 kg 107.7 kg 107 kg    Examination: General: Appears stable on vent in supine position with no acute distress Lungs: Fine crackles diffusely bilateral fields with no wheezing Cardiovascular: Mildly tachycardic without murmur or rub Abdomen: Soft, overweight, no mass, no rebound, bowel sounds hypoactive Extremities: 1+ lower extremity edema without significant change  CBC: Recent Labs  Lab 03/11/19 0500  03/12/19 0504  03/13/19 0852 03/14/19 0435 03/14/19 0521  WBC 13.4*  --  15.7*  --   --   --  17.1*  NEUTROABS 12.4*  --  13.9*  --   --   --  15.5*  HGB 12.3*   < > 11.8*   < > 12.6* 13.3 11.1*  HCT 40.4   < > 39.3   < > 37.0* 39.0 38.0*  MCV 105.5*  --  105.6*  --   --   --  105.8*  PLT 135*  --  162  --   --   --  180   < > = values in this interval not displayed.   Basic Metabolic Panel: Recent Labs  Lab  03/10/19 0703  03/11/19 0500  03/12/19 0504  03/13/19 0545 03/13/19 0852 03/14/19 0435 03/14/19 0521  NA  --    < > 149*   < > 149*   < > 153* 150* 152* 155*  K  --    < > 4.7   < > 4.3   < > 3.8 3.7 3.3* 3.6  CL  --   --  102  --  101  --  103  --   --  101  CO2  --   --  39*  --  38*  --  42*  --   --  41*  GLUCOSE  --   --  161*  --  151*  --  98  --   --  105*  BUN  --   --  99*  --  95*  --  98*  --   --  82*    CREATININE  --   --  1.33*  --  1.29*  --  1.11  --   --  1.17  CALCIUM  --   --  8.3*  --  8.1*  --  8.3*  --   --  8.3*  MG 2.6*  --  2.8*  --   --   --  2.6*  --   --   --    < > = values in this interval not displayed.   GFR: Estimated Creatinine Clearance: 62.7 mL/min (by C-G formula based on SCr of 1.17 mg/dL).  Liver Function Tests: Recent Labs  Lab 03/10/19 0500 03/11/19 0500 03/12/19 0504 03/14/19 0521  AST 38 39 39 41  ALT 58* 53* 51* 37  ALKPHOS 60 68 75 83  BILITOT 0.5 0.7 0.6 0.5  PROT 5.0* 5.3* 5.1* 5.2*  ALBUMIN 2.8* 2.6* 2.4* 2.1*    Coagulation Profile: Recent Labs  Lab 03/09/19 0446 03/10/19 0500 03/11/19 0500 03/12/19 0504 03/13/19 0545  INR 1.1 1.0 1.0 1.0 0.9    HbA1C: Hemoglobin A1C  Date/Time Value Ref Range Status  09/22/2012 02:49 AM 5.9 4.2 - 6.3 % Final    Comment:    The American Diabetes Association recommends that a primary goal of therapy should be <7% and that physicians should reevaluate the treatment regimen in patients with HbA1c values consistently >8%.     CBG: Recent Labs  Lab 03/13/19 1234 03/13/19 1605 03/13/19 2027 03/13/19 2319 03/14/19 0522  GLUCAP 144* 177* 128* 136* 102*    Recent Results (from the past 240 hour(s))  Culture, blood (routine x 2)     Status: None (Preliminary result)   Collection Time: 03/08/19 10:45 PM   Specimen: BLOOD  Result Value Ref Range Status   Specimen Description   Final    BLOOD RIGHT THUMB Performed at [facility], [address]  [place], [place] [address]    Special Requests   Final    BOTTLES DRAWN AEROBIC AND ANAEROBIC Blood Culture adequate volume Performed at [facility], [address] [place], [place] [address]    Culture   Final    NO GROWTH 4 DAYS Performed at [facility], [address] [place], [place] [address]    Report Status PENDING  Incomplete  Culture, blood (routine x 2)     Status: None (Preliminary result)   Collection Time: 03/08/19 10:52 PM   Specimen: BLOOD  Result Value Ref Range Status   Specimen Description   Final    BLOOD RIGHT INDEX FINGER Performed at [facility], [address] [place], [place] [address]    Special Requests   Final    BOTTLES DRAWN AEROBIC AND ANAEROBIC Blood Culture adequate volume Performed at [facility], [address] [place], [place] [address]    Culture   Final    NO GROWTH 4 DAYS Performed at [facility], [address] [place], [place] [address]    Report Status PENDING  Incomplete  Culture, respiratory     Status: None   Collection Time: 03/08/19 11:30 PM   Specimen: Tracheal Aspirate; Respiratory  Result Value Ref Range Status   Specimen Description   Final    TRACHEAL ASPIRATE Performed at [facility], [address] [place], [place] [address]    Special Requests   Final    Normal Performed at [facility], [address]   [place], [place] [address]    Gram Stain   Final    RARE WBC PRESENT, PREDOMINANTLY PMN ABUNDANT GRAM NEGATIVE RODS Performed at [facility], [address] [place], [place] [address]    Culture ABUNDANT PSEUDOMONAS AERUGINOSA  Final   Report Status 03/11/2019 FINAL  Final   Organism ID, Bacteria PSEUDOMONAS AERUGINOSA  Final      Susceptibility   Pseudomonas aeruginosa - MIC*    CEFTAZIDIME 4 SENSITIVE Sensitive     CIPROFLOXACIN <=0.25 SENSITIVE Sensitive     GENTAMICIN <=1 SENSITIVE  Sensitive     IMIPENEM 2 SENSITIVE Sensitive     PIP/TAZO 64 SENSITIVE Sensitive     CEFEPIME <=1 SENSITIVE Sensitive     * ABUNDANT PSEUDOMONAS AERUGINOSA     Scheduled Meds: . artificial tears  1 application Both Eyes Q8H  . chlorhexidine gluconate (MEDLINE KIT)  15 mL Mouth Rinse BID  . Chlorhexidine Gluconate Cloth  6 each Topical Daily  . clonazePAM  2 mg Per Tube Q8H  . collagenase   Topical Daily  . enoxaparin (LOVENOX) injection  0.5 mg/kg Subcutaneous BID  . feeding supplement (PRO-STAT SUGAR FREE 64)  60 mL Per Tube TID  . free water  300 mL Per Tube Q6H  . insulin aspart  0-20 Units Subcutaneous Q4H  . insulin aspart  6 Units Subcutaneous Q4H  . mouth rinse  15 mL Mouth Rinse 10 times per day  . methylPREDNISolone (SOLU-MEDROL) injection  10 mg Intravenous Daily  . metoprolol tartrate  10 mg Intravenous Q6H  . multivitamin with minerals  1 tablet Per Tube Daily  . oxyCODONE  10 mg Per Tube Q6H  . pantoprazole sodium  40 mg Per Tube Daily  . polyethylene glycol  17 g Per Tube BID  . sodium chloride flush  3 mL Intravenous Q12H  . pamrevlumab or placebo  35 mg/kg Intravenous Once   Continuous Infusions: . sodium chloride    . sodium chloride    . dextrose    . feeding supplement (VITAL 1.5 CAL) 1,000 mL (03/11/19 1639)  . HYDROmorphone 2.5 mg/hr (03/14/19 0726)  . ketamine (KETALAR) Adult IV Infusion 1 mg/kg/hr (03/14/19 0600)  . piperacillin-tazobactam (ZOSYN)  IV 3.375 g (03/14/19 0607)  . propofol (DIPRIVAN) infusion Stopped (03/13/19 1421)     LOS: 29 days    T. , MD [facility] Office  [phone] Pager - Text Page per Amion  If 7PM-7AM, please contact night-coverage per Amion 03/14/2019, 9:20 AM     

## 2019-03-14 NOTE — Progress Notes (Signed)
Spoke to Pt's daughter and updated her on pt's current condition. I answered her questions and addressed her concerns to the best of my ability.

## 2019-03-14 NOTE — Research (Signed)
Research Note   Title: [id number] ([employer] Study) Randomized, Double-Blind, Placebo-Controlled Phase 2 Study of the Efficacy and Safety of Intravenous Pamrevlumab, a Monoclonal Antibody Against Connective Tissue Growth Factor (CTGF), in Hospitalized Patients with Acute COVID-19 Disease .  alized Patients with Acute COVID-19 Disease  Protocol #: FGCL-3019-098, Clinical Trials #: [id number] Sponsor: [email or web address]  ([place], [place], [place])  Study Contact: Cristopher Peru, RN            Clinical Nurse Research Coordinator [phone]  Principal Investigator: Brand Males, MD, [facility], ACRP-CPI   [employer] Research    03/14/19 1238  Vitals  Temp 100.1 F (37.8 C)  Temp Source Axillary  Pulse Rate (!) 104  ECG Heart Rate 97  Resp (!) 28  Oxygen Therapy  SpO2 (!) 84 %  Art Line  Arterial Line BP 135/54  Arterial Line MAP (mmHg) 73 mmHg   IP infusion started at 12:40. IP infusion completed at 13:10.    03/14/19 1315  Vitals  Temp 99.7 F (37.6 C)  BP (!) 125/50  MAP (mmHg) 73  Pulse Rate (!) 108  ECG Heart Rate 91  Resp (!) 25  Oxygen Therapy  SpO2 (!) 88 %   Clinically Relevant Information obtained during the visit.

## 2019-03-14 NOTE — Progress Notes (Signed)
NAME:  Clayton Sawyer, MRN:  [id number], DOB:  12-Nov-1940, LOS: 80 ADMISSION DATE:  02/15/2019, CONSULTATION DATE: February 13, 2019 REFERRING MD: Dr. 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 Hyperlipidemia History of prostate cancer Obstructive sleep apnea  Significant Hospital Events   7/15 Admit, transfer to [facility] 7/16 enrolled in fibrinogen research drug  7/27 PCCM consulted again for worsening oxygenation, pneumothorax on right 7/28 transferred to the intensive care unit, full code, intubated, right-sided chest tube 7/31 worsening vent settings, severe hypoxemia, high sedation needs 8/2 Worsening ventilator dyssynchrony overnight when attempted to stop Precedex, started bivalrudin 8/4 made DNR, worsening oxygen needs, ventilator synchrony 8/5 family requested transfer to [facility], [place] made call, the did not accept 8/6 palliative care consult. Family declined 8/7 Responsive to increased PEEP 8/8-8/11 Proned with improved oxygenation 8/12-Proned this morning on FIO2 50 and PEEP 10   Consults:  Pulmonary and critical care medicine  Procedures:  ETT 7/28 CVC 7/28 Pigtail R 7/28  Significant Diagnostic Tests:    Micro Data:  7/10, 7/15 SARS-CoV-2 positive 7/16 blood > coag neg staph in 1/4 7/31 blood >  7/31 resp > enterobacter, pseudomonas 8/7 resp > pseudomonas  Antimicrobials/COVID treatment  Ceftriaxone July 14 Azithromycin July 15 Remdesivir July 15 > July 19 Actemra July 15, July 19 Vancomycin July 18, July 19 Solumedrol 7/16 >> Pamrevlumab vs placebo, next scheduled infusion July 29 Cefepime 7/31> 8/6  Zosyn 8/9 >>  Interim history/subjective:   Has bleeding from nose and mouth.  Febrile to 102  Objective   Blood pressure (!) 125/50, pulse (!)  108, temperature 99.7 F (37.6 C), resp. rate (!) 25, height 5\' 9"  (1.753 m), weight 107 kg, SpO2 (!) 88 %.    Vent Mode: PRVC FiO2 (%):  [50 %-100 %] 70 % Set Rate:  [32 bmp-35 bmp] 35 bmp Vt Set:  [430 mL-560 mL] 430 mL PEEP:  [10 BDZ32-99 cmH20] 10 cmH20 Plateau Pressure:  [23 cmH20-43 cmH20] 23 cmH20   Intake/Output Summary (Last 24 hours) at 03/14/2019 1346 Last data filed at 03/14/2019 0600 Gross per 24 hour  Intake 1720.82 ml  Output 5200 ml  Net -3479.18 ml   Filed Weights   03/09/19 0500 03/13/19 0500 03/14/19 0500  Weight: 108.8 kg 107.7 kg 107 kg   Examination: Gen:      No acute distress HEENT:  EOMI, sclera anicteric Neck:     No masses; no thyromegaly, ET tube Lungs:    Clear to auscultation bilaterally; normal respiratory effort CV:         Regular rate and rhythm; no murmurs Abd:      + bowel sounds; soft, non-tender; no palpable masses, no distension Ext:    No edema; adequate peripheral perfusion Skin:      Warm and dry; no rash Neuro: Sedated, unresponsive.  Resolved Hospital Problem list    Assessment & Plan:  ARDS due to COVID-19 pneumonia: C/b PTX and recurrent Pseudomonas HCAP. Vent Day 13  Plan Continue ARDS net ventilation Has responded to proning but will hold off for today as he has oropharyngeal bleeding Holding diuretics due to fevers. Tapering prednisone to off  Right sided pneumothorax status post 14 French pigtail chest tube catheter Chest tube to suction Follow intermittent chest x-ray  HCAP: pseudomonas and enterobacter: Repeat  culture with abundant PA: favor active infection vs colonization in setting of worsening leukocytosis and persistent hypoxemia S/p Cefepime x 7 days Continue Zosyn for 7 day course.  Agitation requiring sedation: episodes of dyssynchrony, fentanyl tachyphylaxis? Continue ketamine, Dilaudid and propofol On oxycodone and Klonopin.  Thromboctytopenia: not HITT, resolved Holding Lovenox due to ordered  pharyngeal bleeding.  GOC:  Palliative Care consulted. Family requested transfer to another facility which was attempted on 8/5 however request for transfer was denied. Family wishes to remain aggressive including pursuing tracheostomy.  Reasonable to pursue trial of proning for single organ injury though his status remains critical in setting of prolonged illness. Prognosis remains guarded.  Best practice:  Diet: tube feeding Pain/Anxiety/Delirium protocol (if indicated): yes, see above VAP protocol (if indicated): yes DVT prophylaxis: SCD GI prophylaxis: Pantoprazole for stress ulcer prophylaxis Glucose control: per TRH Mobility: bed rest Code Status: DNR Family Communication: per TRH.  Disposition: remain in ICU  Labs   CBC: Recent Labs  Lab 03/09/19 0446  03/10/19 0500  03/11/19 0500  03/12/19 0504 03/12/19 1054 03/13/19 0109 03/13/19 0852 03/14/19 0435 03/14/19 0521  WBC 8.5  --  12.5*  --  13.4*  --  15.7*  --   --   --   --  17.1*  NEUTROABS 6.5  --  10.9*  --  12.4*  --  13.9*  --   --   --   --  15.5*  HGB 11.2*   < > 12.3*   < > 12.3*   < > 11.8* 12.2* 11.6* 12.6* 13.3 11.1*  HCT 36.9*   < > 42.4   < > 40.4   < > 39.3 36.0* 34.0* 37.0* 39.0 38.0*  MCV 105.4*  --  106.3*  --  105.5*  --  105.6*  --   --   --   --  105.8*  PLT 107*  --  150  --  135*  --  162  --   --   --   --  180   < > = values in this interval not displayed.    Basic Metabolic Panel: Recent Labs  Lab 03/10/19 0500 03/10/19 0703  03/11/19 0500  03/12/19 0504  03/13/19 0109 03/13/19 0545 03/13/19 0852 03/14/19 0435 03/14/19 0521  NA 149*  --    < > 149*   < > 149*   < > 150* 153* 150* 152* 155*  K 5.0  --    < > 4.7   < > 4.3   < > 3.7 3.8 3.7 3.3* 3.6  CL 101  --   --  102  --  101  --   --  103  --   --  101  CO2 40*  --   --  39*  --  38*  --   --  42*  --   --  41*  GLUCOSE 166*  --   --  161*  --  151*  --   --  98  --   --  105*  BUN 102*  --   --  99*  --  95*  --   --  98*   --   --  82*  CREATININE 1.63*  --   --  1.33*  --  1.29*  --   --  1.11  --   --  1.17  CALCIUM 8.2*  --   --  8.3*  --  8.1*  --   --  8.3*  --   --  8.3*  MG  --  2.6*  --  2.8*  --   --   --   --  2.6*  --   --   --    < > = values in this interval not displayed.   GFR: Estimated Creatinine Clearance: 62.7 mL/min (by C-G formula based on SCr of 1.17 mg/dL). Recent Labs  Lab 03/10/19 0500 03/11/19 0500 03/12/19 0504 03/14/19 0521  WBC 12.5* 13.4* 15.7* 17.1*    Liver Function Tests: Recent Labs  Lab 03/09/19 0446 03/10/19 0500 03/11/19 0500 03/12/19 0504 03/14/19 0521  AST 31 38 39 39 41  ALT 49* 58* 53* 51* 37  ALKPHOS 43 60 68 75 83  BILITOT 0.7 0.5 0.7 0.6 0.5  PROT 4.3* 5.0* 5.3* 5.1* 5.2*  ALBUMIN 2.3* 2.8* 2.6* 2.4* 2.1*   No results for input(s): LIPASE, AMYLASE in the last 168 hours. No results for input(s): AMMONIA in the last 168 hours.  ABG    Component Value Date/Time   PHART 7.379 03/14/2019 0435   PCO2ART 80.2 (HH) 03/14/2019 0435   PO2ART 111.0 (H) 03/14/2019 0435   HCO3 46.4 (H) 03/14/2019 0435   TCO2 49 (H) 03/14/2019 0435   O2SAT 97.0 03/14/2019 0435     Coagulation Profile: Recent Labs  Lab 03/09/19 0446 03/10/19 0500 03/11/19 0500 03/12/19 0504 03/13/19 0545  INR 1.1 1.0 1.0 1.0 0.9    Cardiac Enzymes: No results for input(s): CKTOTAL, CKMB, CKMBINDEX, TROPONINI in the last 168 hours.  HbA1C: Hemoglobin A1C  Date/Time Value Ref Range Status  09/22/2012 02:49 AM 5.9 4.2 - 6.3 % Final    Comment:    The American Diabetes Association recommends that a primary goal of therapy should be <7% and that physicians should reevaluate the treatment regimen in patients with HbA1c values consistently >8%.     CBG: Recent Labs  Lab 03/13/19 1605 03/13/19 2027 03/13/19 2319 03/14/19 0522 03/14/19 1202  GLUCAP 177* 128* 136* 102* 105*    The patient is critically ill with multiple organ system failure and requires high complexity  decision making for assessment and support, frequent evaluation and titration of therapies, advanced monitoring, review of radiographic studies and interpretation of complex data.   Critical Care Time devoted to patient care services, exclusive of separately billable procedures, described in this note is 35 minutes.   Marshell Garfinkel MD [facility] Pager [phone] If no answer call [phone] 03/14/2019, 1:46 PM

## 2019-03-14 NOTE — Progress Notes (Signed)
[facility] called unit and spoke with pt's nurse, Casimer Bilis regarding pt's need for PIV to administer research medication as indicated last night. Casimer Bilis stated pt was without PIV access. His understanding from report was that patient needs a PIV for the research drug that he is receiving due to the length of the catheter, but he wasn't completely sure. We discussed types of lines that would benefit patient most, comparing PIV, midline and triple lumen PICC. Sidney to contact research team to confirm type of line needed for specific research drug and place consult for that type of line placement. Line will need to be placed today once confirmed.

## 2019-03-14 NOTE — Progress Notes (Signed)
Provided update to patient's wife.

## 2019-03-15 ENCOUNTER — Inpatient Hospital Stay (HOSPITAL_COMMUNITY): Payer: Medicare Other

## 2019-03-15 LAB — CBC WITH DIFFERENTIAL/PLATELET
Abs Immature Granulocytes: 0.2 10*3/uL — ABNORMAL HIGH (ref 0.00–0.07)
Basophils Absolute: 0 10*3/uL (ref 0.0–0.1)
Basophils Relative: 0 %
Eosinophils Absolute: 0.1 10*3/uL (ref 0.0–0.5)
Eosinophils Relative: 1 %
HCT: 35.6 % — ABNORMAL LOW (ref 39.0–52.0)
Hemoglobin: 10.4 g/dL — ABNORMAL LOW (ref 13.0–17.0)
Immature Granulocytes: 1 %
Lymphocytes Relative: 4 %
Lymphs Abs: 0.6 10*3/uL — ABNORMAL LOW (ref 0.7–4.0)
MCH: 31.9 pg (ref 26.0–34.0)
MCHC: 29.2 g/dL — ABNORMAL LOW (ref 30.0–36.0)
MCV: 109.2 fL — ABNORMAL HIGH (ref 80.0–100.0)
Monocytes Absolute: 0.7 10*3/uL (ref 0.1–1.0)
Monocytes Relative: 4 %
Neutro Abs: 14.8 10*3/uL — ABNORMAL HIGH (ref 1.7–7.7)
Neutrophils Relative %: 90 %
Platelets: 169 10*3/uL (ref 150–400)
RBC: 3.26 MIL/uL — ABNORMAL LOW (ref 4.22–5.81)
RDW: 20.3 % — ABNORMAL HIGH (ref 11.5–15.5)
WBC: 16.4 10*3/uL — ABNORMAL HIGH (ref 4.0–10.5)
nRBC: 0.2 % (ref 0.0–0.2)

## 2019-03-15 LAB — IRON AND TIBC
Iron: 24 ug/dL — ABNORMAL LOW (ref 45–182)
Saturation Ratios: 13 % — ABNORMAL LOW (ref 17.9–39.5)
TIBC: 182 ug/dL — ABNORMAL LOW (ref 250–450)
UIBC: 158 ug/dL

## 2019-03-15 LAB — POCT I-STAT 7, (LYTES, BLD GAS, ICA,H+H)
Acid-Base Excess: 16 mmol/L — ABNORMAL HIGH (ref 0.0–2.0)
Bicarbonate: 44.7 mmol/L — ABNORMAL HIGH (ref 20.0–28.0)
Calcium, Ion: 1.21 mmol/L (ref 1.15–1.40)
Calcium, Ion: 1.25 mmol/L (ref 1.15–1.40)
HCT: 41 % (ref 39.0–52.0)
HCT: 32 % — ABNORMAL LOW (ref 39.0–52.0)
Hemoglobin: 13.9 g/dL (ref 13.0–17.0)
Hemoglobin: 10.9 g/dL — ABNORMAL LOW (ref 13.0–17.0)
O2 Saturation: 74 %
Patient temperature: 98.2
Patient temperature: 99.6
Potassium: 4.8 mmol/L (ref 3.5–5.1)
Potassium: 3.5 mmol/L (ref 3.5–5.1)
Sodium: 144 mmol/L (ref 135–145)
Sodium: 155 mmol/L — ABNORMAL HIGH (ref 135–145)
TCO2: 47 mmol/L — ABNORMAL HIGH (ref 22–32)
pCO2 arterial: >97 mmHg (ref 32.0–48.0)
pCO2 arterial: 83 mmHg (ref 32.0–48.0)
pH, Arterial: 7.237 — ABNORMAL LOW (ref 7.350–7.450)
pH, Arterial: 7.342 — ABNORMAL LOW (ref 7.350–7.450)
pO2, Arterial: 68 mmHg — ABNORMAL LOW (ref 83.0–108.0)
pO2, Arterial: 46 mmHg — ABNORMAL LOW (ref 83.0–108.0)

## 2019-03-15 LAB — VITAMIN B12: Vitamin B-12: 216 pg/mL (ref 180–914)

## 2019-03-15 LAB — COMPREHENSIVE METABOLIC PANEL
ALT: 34 U/L (ref 0–44)
AST: 36 U/L (ref 15–41)
Albumin: 1.8 g/dL — ABNORMAL LOW (ref 3.5–5.0)
Alkaline Phosphatase: 72 U/L (ref 38–126)
Anion gap: 5 (ref 5–15)
BUN: 72 mg/dL — ABNORMAL HIGH (ref 8–23)
CO2: 44 mmol/L — ABNORMAL HIGH (ref 22–32)
Calcium: 8.3 mg/dL — ABNORMAL LOW (ref 8.9–10.3)
Chloride: 106 mmol/L (ref 98–111)
Creatinine, Ser: 1 mg/dL (ref 0.61–1.24)
GFR calc Af Amer: >60 mL/min (ref >60)
GFR calc non Af Amer: >60 mL/min (ref >60)
Glucose, Bld: 89 mg/dL (ref 70–99)
Potassium: 3.6 mmol/L (ref 3.5–5.1)
Sodium: 155 mmol/L — ABNORMAL HIGH (ref 135–145)
Total Bilirubin: 0.5 mg/dL (ref 0.3–1.2)
Total Protein: 4.9 g/dL — ABNORMAL LOW (ref 6.5–8.1)

## 2019-03-15 LAB — RETICULOCYTES
Immature Retic Fract: 15.7 % (ref 2.3–15.9)
RBC.: 3.26 MIL/uL — ABNORMAL LOW (ref 4.22–5.81)
Retic Count, Absolute: 136.6 10*3/uL (ref 19.0–186.0)
Retic Ct Pct: 4.2 % — ABNORMAL HIGH (ref 0.4–3.1)

## 2019-03-15 LAB — FERRITIN: Ferritin: 226 ng/mL (ref 24–336)

## 2019-03-15 LAB — GLUCOSE, CAPILLARY
Glucose-Capillary: 133 mg/dL — ABNORMAL HIGH (ref 70–99)
Glucose-Capillary: 159 mg/dL — ABNORMAL HIGH (ref 70–99)
Glucose-Capillary: 181 mg/dL — ABNORMAL HIGH (ref 70–99)
Glucose-Capillary: 84 mg/dL (ref 70–99)
Glucose-Capillary: 90 mg/dL (ref 70–99)
Glucose-Capillary: 95 mg/dL (ref 70–99)
Glucose-Capillary: 98 mg/dL (ref 70–99)

## 2019-03-15 LAB — FOLATE: Folate: 8.7 ng/mL (ref >5.9)

## 2019-03-15 LAB — TRIGLYCERIDES: Triglycerides: 145 mg/dL (ref <150)

## 2019-03-15 MED ORDER — PANTOPRAZOLE SODIUM 40 MG PO PACK
40.0000 mg | PACK | Freq: Two times a day (BID) | ORAL | Status: DC
  Administered 2019-03-15 – 2019-03-16 (×2): 40 mg
  Filled 2019-03-15 (×2): qty 20

## 2019-03-15 MED ORDER — VECURONIUM BROMIDE 10 MG IV SOLR
10.0000 mg | Freq: Once | INTRAVENOUS | Status: AC
  Administered 2019-03-15: 16:00:00 10 mg via INTRAVENOUS
  Filled 2019-03-15: qty 10

## 2019-03-15 MED ORDER — VITAL 1.5 CAL PO LIQD
1000.0000 mL | ORAL | Status: DC
  Administered 2019-03-15: 17:00:00 1000 mL
  Filled 2019-03-15 (×3): qty 1000

## 2019-03-15 MED ORDER — FUROSEMIDE 10 MG/ML IJ SOLN
40.0000 mg | Freq: Once | INTRAMUSCULAR | Status: AC
  Administered 2019-03-15: 40 mg via INTRAVENOUS
  Filled 2019-03-15: qty 4

## 2019-03-15 NOTE — Progress Notes (Signed)
Granddaughter [place] Sawyer called for updates on pt status. Grandaughter states she has kept up with pt's stats regarding his ventilator settings and that he was improving with higher dose of steroids and proning. [place] Sawyer was advised that tapering IV and oral steroids is customary course of treatment, also that pt was pronned x1 week with no improvement in pt's overall condition. Granddaughter read vent setting to me over the course of the past few days stating pt shows decline as of yesterday 03/14/19. [place] Sawyer was informed that the pt's overall condition has declined related to the disease (viral) process. Pt shows decline despite the course of treatment and intermittent changes in oxygen requirements based on the patients current position (pronned, left-lying, etc), has not shown to improve his overall condition, nor has the patient been able to sustain adequate oxygenation and ventilation on lower setting. [place] Sawyer was advised that her request to increase steroids and continue pronning despite improvement will be discussed during rounds with the doctor and she will be updated with any changes. No additional needs at this time.

## 2019-03-15 NOTE — Progress Notes (Signed)
RT and care team members arrived at pt's bedside to assist with pronning. RT preparing ETT for pronning, moderate to copious amounts of bright red blood being suctioned from pt's mouth. Polly Cobia, RN notified Dr.Mannam regarding pt's active bleeding. Hold off on proning per Md. Will continue to monitor.

## 2019-03-15 NOTE — Progress Notes (Signed)
Pt's ABG done at approx. 1040 worse, FiO2 increased to 100%, PEEP increased to 16 per RT and Dr. Hart Robinsons at bedside. Will continue to monitor.

## 2019-03-15 NOTE — Progress Notes (Signed)
NAME:  Clayton Sawyer, MRN:  [id number], DOB:  [birth year], LOS: 61 ADMISSION DATE:  02/07/2019, CONSULTATION DATE: February 13, 2019 REFERRING MD: Dr. 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 Hyperlipidemia History of prostate cancer Obstructive sleep apnea  Significant Hospital Events   7/15 Admit, transfer to [facility] 7/16 enrolled in fibrinogen research drug  7/27 PCCM consulted again for worsening oxygenation, pneumothorax on right 7/28 transferred to the intensive care unit, full code, intubated, right-sided chest tube 7/31 worsening vent settings, severe hypoxemia, high sedation needs 8/2 Worsening ventilator dyssynchrony overnight when attempted to stop Precedex, started bivalrudin 8/4 made DNR, worsening oxygen needs, ventilator synchrony 8/5 family requested transfer to [facility], [place] made call, the did not accept 8/6 palliative care consult. Family declined 8/7 Responsive to increased PEEP 8/8-8/11 Proned with improved oxygenation 8/12-Proned this morning on FIO2 50 and PEEP 10 8/13 Proning held due to oronasal bleed  Consults:  Pulmonary and critical care medicine  Procedures:  ETT 7/28 CVC 7/28 Pigtail R 7/28  Significant Diagnostic Tests:    Micro Data:  7/10, 7/15 SARS-CoV-2 positive 7/16 blood > coag neg staph in 1/4 7/31 blood >  7/31 resp > enterobacter, pseudomonas 8/7 resp > pseudomonas  Antimicrobials/COVID treatment  Ceftriaxone July 14 Azithromycin July 15 Remdesivir July 15 > July 19 Actemra July 15, July 19 Vancomycin July 18, July 19 Solumedrol 7/16 >> Pamrevlumab vs placebo 7/29, 8/12 Cefepime 7/31> 8/6  Zosyn 8/9 >>   Interim history/subjective:  No fevers.  Has dark blood per rectum Worsening oxygenation on ABG.  Objective    Blood pressure (!) 140/49, pulse 67, temperature 99.6 F (37.6 C), temperature source Axillary, resp. rate (!) 36, height 5\' 9"  (1.753 m), weight 107 kg, SpO2 91 %.    Vent Mode: PRVC FiO2 (%):  [70 %-90 %] 90 % Set Rate:  [35 bmp] 35 bmp Vt Set:  [430 mL] 430 mL PEEP:  [10 cmH20] 10 cmH20 Plateau Pressure:  [20 cmH20-32 cmH20] 25 cmH20   Intake/Output Summary (Last 24 hours) at 03/15/2019 0917 Last data filed at 03/15/2019 0600 Gross per 24 hour  Intake 3262.27 ml  Output 1610 ml  Net 1652.27 ml   Filed Weights   03/09/19 0500 03/13/19 0500 03/14/19 0500  Weight: 108.8 kg 107.7 kg 107 kg   Examination: Gen:      No acute distress HEENT:  EOMI, sclera anicteric.  Dried blood in the mouth and nasal cavity. Neck:     No masses; no thyromegaly, ET tube Lungs:    Clear to auscultation bilaterally; normal respiratory effort CV:         Regular rate and rhythm; no murmurs Abd:      + bowel sounds; soft, non-tender; no palpable masses, no distension Ext:    No edema; adequate peripheral perfusion Skin:      Warm and dry; no rash Neuro: Sedated, unresponsive  Resolved Hospital Problem list    Assessment & Plan:  ARDS due to COVID-19 pneumonia: C/b PTX and recurrent Pseudomonas HCAP. Vent Day 13  Plan Continue ARDS net ventilation Resume pronating Lasix for diuresis Tapering prednisone to off  Right sided pneumothorax status post 14 French pigtail chest tube catheter Chest tube to suction Follow intermittent chest x-ray  HCAP: pseudomonas and enterobacter: Repeat culture with abundant  PA: favor active infection vs colonization in setting of worsening leukocytosis and persistent hypoxemia S/p Cefepime x 7 days Continue Zosyn for 7 day course.  Agitation requiring sedation: episodes of dyssynchrony, fentanyl tachyphylaxis? Continue ketamine, Dilaudid and propofol On oxycodone and Klonopin.  Thromboctytopenia: not HITT, resolved Holding Lovenox due to ordered pharyngeal  bleeding.  Best practice:  Diet: tube feeding Pain/Anxiety/Delirium protocol (if indicated): yes, see above VAP protocol (if indicated): yes DVT prophylaxis: SCD GI prophylaxis: Pantoprazole for stress ulcer prophylaxis Glucose control: per TRH Mobility: bed rest Code Status: DNR Family Communication: per TRH.  Disposition: remain in ICU   Critical care time: 35 mins   The patient is critically ill with multiple organ system failure and requires high complexity decision making for assessment and support, frequent evaluation and titration of therapies, advanced monitoring, review of radiographic studies and interpretation of complex data.    Marshell Garfinkel MD [facility] Pager [phone] If no answer call [phone] 03/15/2019, 9:18 AM

## 2019-03-15 NOTE — Progress Notes (Signed)
Wife Clayton Sawyer called for updates. Pt has dark stools, VSS on Vent, pt no longer being proned d/t no benefits seen at this time, wife stated that "doctor said he might start turning him again." Wife was informed that if this is the case it will be discussed during rounds today. Wife asked if steriods were discontinued. She was advised that they have not been d/c'd due to pt's respiratory status and need for steroids to improve pt's oxygenation. Clayton Sawyer was updated on current POC and pt status. Clayton Sawyer advised that her granddaughter will call later today for updates. No additional needs at this time.

## 2019-03-15 NOTE — Research (Signed)
Late Entry Note:  Day 28 Visit for research trial [id number] was attempted on 12August2020 by this coordinator. Subject met requirements to proceed with Day 28 infusion so study drug was requested and prepared by the pharmacy. An attempt was made by this coordinator to start the study infusion at approx 11:00 on 12Aug2020. At this time the subject experienced epistaxis that the RN was having difficulty controlling so the request was made for the study infusion to wait until this was under control.  The study drug was kept at room temperature during this time. After several hours this coordinator was advised ok to infuse. This was at approx 13:00. Upon arrival to the unit the subject blood pressure was elevated. The PI, Dr. Brantley Persons was advised, and after review PI gave ok to infuse study medication.  The [facility] RN attempted to flush the available PIV and and was unable to do so and the PIV was lost. At this time it was the study teams understanding that only a PIV could be used, so several attempts were made to obtain another PIV without success.  At this time the study medication had been kept at room temperature for over 6 hours and per protocol, the drug is not to be used. Due to this study drug was not given on 12Aug2020. An attempt will be made on 13Aug2020.  Refer to the subjects paper source binder for further details.   Chester Bing, BS, [place], [place] Coordianator [facility] [phone]

## 2019-03-15 NOTE — Progress Notes (Signed)
Noted patient has dark red rectal bleeding while giving his bath. Dark maroon/brown stool noted in rectal tube. Dr. Olevia Bowens notified of change, no new orders received at this time. MD recommends minimal movement of patient and not to remove rectal tube at this time to prevent further trauma or risk of bleeding. Will continue to monitor patient.

## 2019-03-15 NOTE — Progress Notes (Signed)
Nutrition Follow-up  DOCUMENTATION CODES:   Obesity unspecified  INTERVENTION:   Continue TF via Cortrak tube:   Vital 1.5 increase to 45 ml/h (1080 ml per day)   Pro-stat 60 ml TID   Provides 2220 kcal, 163 gm protein, 826 ml free water daily  NUTRITION DIAGNOSIS:   Inadequate oral intake related to acute illness as evidenced by NPO status.  Ongoing   GOAL:   Patient will meet greater than or equal to 90% of their needs  Met with TF  MONITOR:   TF tolerance, Vent status, Skin, Weight trends, Labs  REASON FOR ASSESSMENT:   Consult, Ventilator Enteral/tube feeding initiation and management  ASSESSMENT:   78 yo male admitted on 7/15 with acute respiratory failure due to COVID-19 virus, pt with decline in respiratory status and pneumothorax requring transfer to ICU and intubation on 7/28.  PMH includes stroke, HTN, CAD  Received MD Consult for TF initiation and management. Patient is currently receiving Vital 1.5 at 40 ml/h via post-pyloric Cortrak tube with Pro-stat 60 ml TID providing 2040 kcal, 155 gm protein. Free water flushes 300 ml every 4 hours.   Propofol has been discontinued. Remains on hydromorphone and ketamine drips.   Patient remains intubated on ventilator support, currently not proning. MV: 12.8 L/min Temp (24hrs), Avg:99.8 F (37.7 C), Min:98.2 F (36.8 C), Max:100.9 F (38.3 C)   Labs reviewed. Sodium 155 (H) CBG's: 90-181  Medications reviewed and include novolog, MVI.    Diet Order:   Diet Order            Diet NPO time specified  Diet effective now              EDUCATION NEEDS:   Not appropriate for education at this time  Skin:  Skin Assessment: Skin Integrity Issues: Skin Integrity Issues:: DTI DTI: sacrum  Last BM:  8/14 (type 7)  Height:   Ht Readings from Last 1 Encounters:  02/26/19 5' 9"  (1.753 m)    Weight:   Wt Readings from Last 1 Encounters:  03/14/19 107 kg    Ideal Body Weight:  72.7  kg  BMI:  Body mass index is 34.84 kg/m.  Estimated Nutritional Needs:   Kcal:  2250  Protein:  145-180 g  Fluid:  >/= 1.8 L    Molli Barrows, RD, LDN, [place] Pager [phone] After Hours Pager [phone]

## 2019-03-15 NOTE — Progress Notes (Signed)
[facility] TEAM 1 - Stepdown/ICU TEAM  Clayton Sawyer  JJO:[id number] DOB: 1940/11/04 DOA: 02/05/2019 PCP: Jodi Marble, MD    Brief Narrative:  78 year old with a history of HTN, CAD status post PCI, pacemaker placement, and prostate cancer who presented to [facility] 7/15 with shortness of breath and was found to be in acute hypoxic respiratory failure with COVID pneumonia.  He was admitted to [place] from the ED. He has been treated with steroids, Remdesivir, and Actemra x2.  After improving he was able to be transferred out of the ICU 7/23 but subsequently decompensated.  He returned to the ICU on 728 where he required intubation and chest tube placement for a pneumothorax.  Significant Events: 7/15 admit to [facility] via [facility] ED 7/16 PCCM consulted - enrolled in fibrogen research study - [place] utilized  7/23 transfer out of ICU  7/26 resp decline - refused transfer back to ICU  7/27 worsening oxygenation- new right-sided pneumothorax 7/28 return to ICU - intubated - right-sided chest tube placed 8/2 worsening ventilator dyssynchrony 8/4 transition to DNR/NO CODE BLUE 8/5 family requested transfer to [facility] who refused to accept the patient 8/6 Palliative Care consulted - family declined comfort care   COVID-19 specific Treatment: Steroids Remdesivir Actemra x2  Subjective: The patient was noted to have some dark red/maroon blood and around his rectal tube early this morning.  His hemoglobin is stable.  He remains supine at this time.  He has not had a fever over the last 24 hours.  His oxygen demands have increased.  Overall he appears to be losing ground.  He remains ventilator dependent.  I discussed his care with Dr. Vaughan Browner at the bedside.  Assessment & Plan:  COVID pneumonia - acute hypoxic respiratory failure Has completed treatment with Remdesivir and Actemra - nearing completion of steroid treatment - remains ventilator dependent -  ventilator management per PCCM - will likely require tracheostomy soon -return to prone position today  Septic shock - Enterobacter and recurrent Pseudomonas pneumonia (per trach aspirate cx) Completed 7 days of cefepime - now off pressor support - continue Zosyn through 8/15  Recurrent Fever Had fever to 102.6 early this morning - blood cultures x2 8/7 and 7/31 negative -no change in treatment for now and continue to follow -no fever over the last 24 hours  Right-sided pneumothorax Chest tube placed 7/28 - management per PCCM - plan is to keep to suction until extubated -chest x-ray today notes no current pneumothorax  Acute renal failure Creatinine is stable   SVT Continue beta-blocker -heart rate stable presently  Transaminitis LFTs have normalized   Recent Labs  Lab 03/10/19 0500 03/11/19 0500 03/12/19 0504 03/14/19 0521 03/15/19 0530  AST 38 39 39 41 36  ALT 58* 53* 51* 37 34  ALKPHOS 60 68 75 83 72  BILITOT 0.5 0.7 0.6 0.5 0.5  PROT 5.0* 5.3* 5.1* 5.2* 4.9*  ALBUMIN 2.8* 2.6* 2.4* 2.1* 1.8*    Hyperglycemia due to steroids CBG controlled   Hypernatremia Continue with free enteral water   Thrombocytopenia Platelet count now consistently greater than 100  Epistaxis Appears to have stopped with the patient's Lovenox being held -continue to hold Lovenox for now and follow  Guaiac positive stool No significant drop in hemoglobin -doubt this is due to a GI bleed and is likely related to epistaxis with transit of blood through GI tract -monitor -maximize dose of PPI  Stage I deep tissue injury Not  present on admission -first noted 8/1 Pressure Injury 03/02/19 Sacrum Deep Tissue Injury - Purple or maroon localized area of discolored intact skin or blood-filled blister due to damage of underlying soft tissue from pressure and/or shear. (Active)  03/02/19 1730  Location: Sacrum  Location Orientation:   Staging: Deep Tissue Injury - Purple or maroon localized area  of discolored intact skin or blood-filled blister due to damage of underlying soft tissue from pressure and/or shear.  Wound Description (Comments):   Present on Admission: No    Coronary artery disease status post PCI No evidence of ACS  Anxiety Sedated on vent presently  Prostate cancer Quiescent  Obesity - Estimated body mass index is 34.84 kg/m as calculated from the following:   Height as of this encounter: 5' 9"  (1.753 m).   Weight as of this encounter: 107 kg.   Macrocytosis B12 is below goal of 400 at 216 -supplement and follow -folate is normal   DVT prophylaxis: SCDs only due to hectic epistaxis Code Status: DNR - NO CODE  Family Communication: Spoke with wife via telephone Disposition Plan: Prognosis remains very guarded  Consultants:  PCCM Palliative Care  Antimicrobials:  Vancomycin 7/18 - 7/19, 7/31 - 8/1 Cefepime 7/31 - 8/6 Zosyn 8/9 >   Objective: Blood pressure (!) 140/49, pulse 67, temperature 99.6 F (37.6 C), temperature source Axillary, resp. rate (!) 36, height 5' 9"  (1.753 m), weight 107 kg, SpO2 91 %.  Intake/Output Summary (Last 24 hours) at 03/15/2019 0933 Last data filed at 03/15/2019 0600 Gross per 24 hour  Intake 3262.27 ml  Output 1610 ml  Net 1652.27 ml   Filed Weights   03/09/19 0500 03/13/19 0500 03/14/19 0500  Weight: 108.8 kg 107.7 kg 107 kg    Examination: General: No evidence of distress/anxiety supine on vent Lungs: Fine crackles diffusely bilateral fields -no change Cardiovascular: RRR without murmur Abdomen: Soft, overweight, no mass, no rebound, bowel sounds hypoactive Extremities: Diffuse 1+ edema  CBC: Recent Labs  Lab 03/12/19 0504  03/14/19 0521 03/14/19 1613 03/15/19 0530  WBC 15.7*  --  17.1*  --  16.4*  NEUTROABS 13.9*  --  15.5*  --  14.8*  HGB 11.8*   < > 11.1* 10.9* 10.4*  HCT 39.3   < > 38.0* 32.0* 35.6*  MCV 105.6*  --  105.8*  --  109.2*  PLT 162  --  180  --  169   < > = values in this  interval not displayed.   Basic Metabolic Panel: Recent Labs  Lab 03/10/19 0703  03/11/19 0500  03/13/19 0545  03/14/19 0521 03/14/19 1613 03/15/19 0530  NA  --    < > 149*   < > 153*   < > 155* 152* 155*  K  --    < > 4.7   < > 3.8   < > 3.6 3.7 3.6  CL  --   --  102   < > 103  --  101  --  106  CO2  --   --  39*   < > 42*  --  41*  --  44*  GLUCOSE  --   --  161*   < > 98  --  105*  --  89  BUN  --   --  99*   < > 98*  --  82*  --  72*  CREATININE  --   --  1.33*   < > 1.11  --  1.17  --  1.00  CALCIUM  --   --  8.3*   < > 8.3*  --  8.3*  --  8.3*  MG 2.6*  --  2.8*  --  2.6*  --   --   --   --    < > = values in this interval not displayed.   GFR: Estimated Creatinine Clearance: 73.4 mL/min (by C-G formula based on SCr of 1 mg/dL).  Liver Function Tests: Recent Labs  Lab 03/11/19 0500 03/12/19 0504 03/14/19 0521 03/15/19 0530  AST 39 39 41 36  ALT 53* 51* 37 34  ALKPHOS 68 75 83 72  BILITOT 0.7 0.6 0.5 0.5  PROT 5.3* 5.1* 5.2* 4.9*  ALBUMIN 2.6* 2.4* 2.1* 1.8*    Coagulation Profile: Recent Labs  Lab 03/09/19 0446 03/10/19 0500 03/11/19 0500 03/12/19 0504 03/13/19 0545  INR 1.1 1.0 1.0 1.0 0.9    HbA1C: Hemoglobin A1C  Date/Time Value Ref Range Status  09/22/2012 02:49 AM 5.9 4.2 - 6.3 % Final    Comment:    The American Diabetes Association recommends that a primary goal of therapy should be <7% and that physicians should reevaluate the treatment regimen in patients with HbA1c values consistently >8%.     CBG: Recent Labs  Lab 03/14/19 1606 03/14/19 1949 03/15/19 0010 03/15/19 0347 03/15/19 0823  GLUCAP 166* 210* 159* 95 90    Recent Results (from the past 240 hour(s))  Culture, blood (routine x 2)     Status: None   Collection Time: 03/08/19 10:45 PM   Specimen: BLOOD  Result Value Ref Range Status   Specimen Description   Final    BLOOD RIGHT THUMB Performed at [facility] [address], [place], [place]  [address]    Special Requests   Final    BOTTLES DRAWN AEROBIC AND ANAEROBIC Blood Culture adequate volume Performed at [facility] [address] [place], [place] [address]    Culture   Final    NO GROWTH 5 DAYS Performed at [facility], [place] [address], [place], [place] [address]    Report Status 03/14/2019 FINAL  Final  Culture, blood (routine x 2)     Status: None   Collection Time: 03/08/19 10:52 PM   Specimen: BLOOD  Result Value Ref Range Status   Specimen Description   Final    BLOOD RIGHT INDEX FINGER Performed at [facility] [address], [place], [place] [address]    Special Requests   Final    BOTTLES DRAWN AEROBIC AND ANAEROBIC Blood Culture adequate volume Performed at [facility] [address], [place], [place] [address]    Culture   Final    NO GROWTH 5 DAYS Performed at [facility], [place] [address] [place], [place] [address]    Report Status 03/14/2019 FINAL  Final  Culture, respiratory     Status: None   Collection Time: 03/08/19 11:30 PM   Specimen: Tracheal Aspirate; Respiratory  Result Value Ref Range Status   Specimen Description   Final    TRACHEAL ASPIRATE Performed at [facility] [address] [place], [place] [address]    Special Requests   Final    Normal Performed at [facility], [place] [address] [place], [place] [address]    Gram Stain   Final    RARE WBC PRESENT, PREDOMINANTLY PMN ABUNDANT GRAM NEGATIVE RODS Performed at  [facility], [place] [address] [place], [place] [address]    Culture ABUNDANT PSEUDOMONAS AERUGINOSA  Final   Report Status 03/11/2019 FINAL  Final   Organism ID, Bacteria PSEUDOMONAS AERUGINOSA  Final      Susceptibility   Pseudomonas aeruginosa - MIC*    CEFTAZIDIME 4 SENSITIVE Sensitive     CIPROFLOXACIN <=0.25 SENSITIVE Sensitive     GENTAMICIN <=1 SENSITIVE Sensitive     IMIPENEM 2 SENSITIVE  Sensitive     PIP/TAZO 64 SENSITIVE Sensitive     CEFEPIME <=1 SENSITIVE Sensitive     * ABUNDANT PSEUDOMONAS AERUGINOSA     Scheduled Meds: . artificial tears  1 application Both Eyes A1K  . chlorhexidine gluconate (MEDLINE KIT)  15 mL Mouth Rinse BID  . Chlorhexidine Gluconate Cloth  6 each Topical Daily  . clonazePAM  2 mg Per Tube Q8H  . collagenase   Topical Daily  . feeding supplement (PRO-STAT SUGAR FREE 64)  60 mL Per Tube TID  . free water  300 mL Per Tube Q4H  . insulin aspart  0-20 Units Subcutaneous Q4H  . insulin aspart  6 Units Subcutaneous Q4H  . mouth rinse  15 mL Mouth Rinse 10 times per day  . metoprolol tartrate  10 mg Intravenous Q6H  . multivitamin with minerals  1 tablet Per Tube Daily  . oxyCODONE  10 mg Per Tube Q6H  . pantoprazole sodium  40 mg Per Tube Daily  . polyethylene glycol  17 g Per Tube BID  . sodium chloride flush  3 mL Intravenous Q12H   Continuous Infusions: . sodium chloride    . sodium chloride    . dextrose    . feeding supplement (VITAL 1.5 CAL) 40 mL/hr at 03/15/19 0600  . HYDROmorphone 2.5 mg/hr (03/15/19 0600)  . ketamine (KETALAR) Adult IV Infusion 1 mg/kg/hr (03/15/19 0600)  . piperacillin-tazobactam (ZOSYN)  IV 3.375 g (03/15/19 0625)  . propofol (DIPRIVAN) infusion Stopped (03/13/19 1421)     LOS: 30 days   Cherene Altes, MD [facility] Office  [phone] Pager - Text Page per Amion  If 7PM-7AM, please contact night-coverage per Amion 03/15/2019, 9:33 AM

## 2019-03-16 ENCOUNTER — Inpatient Hospital Stay (HOSPITAL_COMMUNITY): Payer: Medicare Other

## 2019-03-16 LAB — CBC WITH DIFFERENTIAL/PLATELET
Abs Immature Granulocytes: 0.13 10*3/uL — ABNORMAL HIGH (ref 0.00–0.07)
Basophils Absolute: 0 10*3/uL (ref 0.0–0.1)
Basophils Relative: 0 %
Eosinophils Absolute: 0 10*3/uL (ref 0.0–0.5)
Eosinophils Relative: 0 %
HCT: 34.6 % — ABNORMAL LOW (ref 39.0–52.0)
Hemoglobin: 9.9 g/dL — ABNORMAL LOW (ref 13.0–17.0)
Immature Granulocytes: 1 %
Lymphocytes Relative: 3 %
Lymphs Abs: 0.5 10*3/uL — ABNORMAL LOW (ref 0.7–4.0)
MCH: 31.1 pg (ref 26.0–34.0)
MCHC: 28.6 g/dL — ABNORMAL LOW (ref 30.0–36.0)
MCV: 108.8 fL — ABNORMAL HIGH (ref 80.0–100.0)
Monocytes Absolute: 0.3 10*3/uL (ref 0.1–1.0)
Monocytes Relative: 2 %
Neutro Abs: 16.1 10*3/uL — ABNORMAL HIGH (ref 1.7–7.7)
Neutrophils Relative %: 94 %
Platelets: 141 10*3/uL — ABNORMAL LOW (ref 150–400)
RBC: 3.18 MIL/uL — ABNORMAL LOW (ref 4.22–5.81)
RDW: 20.2 % — ABNORMAL HIGH (ref 11.5–15.5)
WBC: 17.1 10*3/uL — ABNORMAL HIGH (ref 4.0–10.5)
nRBC: 0.2 % (ref 0.0–0.2)

## 2019-03-16 LAB — COMPREHENSIVE METABOLIC PANEL
ALT: 39 U/L (ref 0–44)
AST: 44 U/L — ABNORMAL HIGH (ref 15–41)
Albumin: 1.6 g/dL — ABNORMAL LOW (ref 3.5–5.0)
Alkaline Phosphatase: 77 U/L (ref 38–126)
Anion gap: 12 (ref 5–15)
BUN: 93 mg/dL — ABNORMAL HIGH (ref 8–23)
CO2: 38 mmol/L — ABNORMAL HIGH (ref 22–32)
Calcium: 8 mg/dL — ABNORMAL LOW (ref 8.9–10.3)
Chloride: 108 mmol/L (ref 98–111)
Creatinine, Ser: 1.8 mg/dL — ABNORMAL HIGH (ref 0.61–1.24)
GFR calc Af Amer: 41 mL/min — ABNORMAL LOW (ref >60)
GFR calc non Af Amer: 35 mL/min — ABNORMAL LOW (ref >60)
Glucose, Bld: 123 mg/dL — ABNORMAL HIGH (ref 70–99)
Potassium: 3.6 mmol/L (ref 3.5–5.1)
Sodium: 158 mmol/L — ABNORMAL HIGH (ref 135–145)
Total Bilirubin: 0.6 mg/dL (ref 0.3–1.2)
Total Protein: 4.9 g/dL — ABNORMAL LOW (ref 6.5–8.1)

## 2019-03-16 LAB — D-DIMER, QUANTITATIVE: D-Dimer, Quant: 2.15 ug/mL-FEU — ABNORMAL HIGH (ref 0.00–0.50)

## 2019-03-16 LAB — GLUCOSE, CAPILLARY
Glucose-Capillary: 110 mg/dL — ABNORMAL HIGH (ref 70–99)
Glucose-Capillary: 142 mg/dL — ABNORMAL HIGH (ref 70–99)
Glucose-Capillary: 144 mg/dL — ABNORMAL HIGH (ref 70–99)

## 2019-03-16 LAB — TRIGLYCERIDES: Triglycerides: 145 mg/dL (ref <150)

## 2019-03-16 LAB — C-REACTIVE PROTEIN: CRP: 29.8 mg/dL — ABNORMAL HIGH (ref <1.0)

## 2019-03-16 MED ORDER — EPINEPHRINE 1 MG/10ML IJ SOSY
PREFILLED_SYRINGE | INTRAMUSCULAR | Status: AC
  Filled 2019-03-16: qty 10

## 2019-03-16 MED ORDER — CYANOCOBALAMIN 1000 MCG/ML IJ SOLN
1000.0000 ug | Freq: Once | INTRAMUSCULAR | Status: DC
  Filled 2019-03-16: qty 1

## 2019-03-16 MED ORDER — STERILE WATER FOR INJECTION IJ SOLN
INTRAMUSCULAR | Status: AC
  Filled 2019-03-16: qty 10

## 2019-03-16 MED ORDER — VECURONIUM BROMIDE 10 MG IV SOLR
10.0000 mg | Freq: Once | INTRAVENOUS | Status: AC
  Administered 2019-03-16: 10:00:00 10 mg via INTRAVENOUS

## 2019-03-16 MED ORDER — SODIUM BICARBONATE 8.4 % IV SOLN
INTRAVENOUS | Status: AC
  Filled 2019-03-16: qty 50

## 2019-03-16 MED ORDER — EPINEPHRINE 1 MG/10ML IJ SOSY
PREFILLED_SYRINGE | INTRAMUSCULAR | Status: AC
  Filled 2019-03-16: qty 20

## 2019-03-16 MED ORDER — PHENYLEPHRINE HCL-NACL 10-0.9 MG/250ML-% IV SOLN
INTRAVENOUS | Status: AC
  Administered 2019-03-16: 100 ug/min via INTRAVENOUS
  Filled 2019-03-16: qty 250

## 2019-03-16 MED ORDER — PHENYLEPHRINE HCL-NACL 10-0.9 MG/250ML-% IV SOLN
0.0000 ug/min | INTRAVENOUS | Status: DC
  Administered 2019-03-16: 100 ug/min via INTRAVENOUS

## 2019-03-16 MED ORDER — NOREPINEPHRINE 4 MG/250ML-% IV SOLN: 0.0000 ug/min | INTRAVENOUS | Status: DC

## 2019-03-17 LAB — POCT I-STAT 7, (LYTES, BLD GAS, ICA,H+H)
Acid-Base Excess: 14 mmol/L — ABNORMAL HIGH (ref 0.0–2.0)
Bicarbonate: 44 mmol/L — ABNORMAL HIGH (ref 20.0–28.0)
Calcium, Ion: 1.25 mmol/L (ref 1.15–1.40)
HCT: 33 % — ABNORMAL LOW (ref 39.0–52.0)
Hemoglobin: 11.2 g/dL — ABNORMAL LOW (ref 13.0–17.0)
O2 Saturation: 90 %
Potassium: 3.7 mmol/L (ref 3.5–5.1)
Sodium: 156 mmol/L — ABNORMAL HIGH (ref 135–145)
TCO2: 47 mmol/L — ABNORMAL HIGH (ref 22–32)
pCO2 arterial: 89 mmHg (ref 32.0–48.0)
pH, Arterial: 7.302 — ABNORMAL LOW (ref 7.350–7.450)
pO2, Arterial: 70 mmHg — ABNORMAL LOW (ref 83.0–108.0)

## 2019-03-18 LAB — POCT I-STAT 7, (LYTES, BLD GAS, ICA,H+H)
Calcium, Ion: 1.2 mmol/L (ref 1.15–1.40)
Calcium, Ion: 1.2 mmol/L (ref 1.15–1.40)
Calcium, Ion: 1.22 mmol/L (ref 1.15–1.40)
HCT: 32 % — ABNORMAL LOW (ref 39.0–52.0)
HCT: 30 % — ABNORMAL LOW (ref 39.0–52.0)
HCT: 31 % — ABNORMAL LOW (ref 39.0–52.0)
Hemoglobin: 10.9 g/dL — ABNORMAL LOW (ref 13.0–17.0)
Hemoglobin: 10.2 g/dL — ABNORMAL LOW (ref 13.0–17.0)
Hemoglobin: 10.5 g/dL — ABNORMAL LOW (ref 13.0–17.0)
Patient temperature: 102
Patient temperature: 102
Potassium: 3.5 mmol/L (ref 3.5–5.1)
Potassium: 3.4 mmol/L — ABNORMAL LOW (ref 3.5–5.1)
Potassium: 3.7 mmol/L (ref 3.5–5.1)
Sodium: 155 mmol/L — ABNORMAL HIGH (ref 135–145)
Sodium: 153 mmol/L — ABNORMAL HIGH (ref 135–145)
Sodium: 154 mmol/L — ABNORMAL HIGH (ref 135–145)
pCO2 arterial: >97 mmHg (ref 32.0–48.0)
pCO2 arterial: >97 mmHg (ref 32.0–48.0)
pCO2 arterial: >97 mmHg (ref 32.0–48.0)
pH, Arterial: 7.192 — CL (ref 7.350–7.450)
pH, Arterial: 7.18 — CL (ref 7.350–7.450)
pH, Arterial: 7.219 — ABNORMAL LOW (ref 7.350–7.450)
pO2, Arterial: 46 mmHg — ABNORMAL LOW (ref 83.0–108.0)
pO2, Arterial: 32 mmHg — CL (ref 83.0–108.0)
pO2, Arterial: 50 mmHg — ABNORMAL LOW (ref 83.0–108.0)

## 2019-04-02 NOTE — Progress Notes (Addendum)
NAME:  Clayton Sawyer, MRN:  [id number], DOB:  06-10-1941, LOS: 47 ADMISSION DATE:  01/30/2019, CONSULTATION DATE: February 13, 2019 REFERRING MD: Dr. 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 Hyperlipidemia History of prostate cancer Obstructive sleep apnea  Significant Hospital Events   7/15 Admit, transfer to [facility] 7/16 enrolled in fibrinogen research drug  7/27 PCCM consulted again for worsening oxygenation, pneumothorax on right 7/28 transferred to the intensive care unit, full code, intubated, right-sided chest tube 7/31 worsening vent settings, severe hypoxemia, high sedation needs 8/2 Worsening ventilator dyssynchrony overnight when attempted to stop Precedex, started bivalrudin 8/4 made DNR, worsening oxygen needs, ventilator synchrony 8/5 family requested transfer to [facility], [place] made call, the did not accept 8/6 palliative care consult. Family declined 8/7 Responsive to increased PEEP 8/8-8/12 Proned with improved oxygenation 8/13 Proning held due to oronasal bleed  Consults:  Pulmonary and critical care medicine  Procedures:  ETT 7/28 CVC 7/28 Pigtail R 7/28  Significant Diagnostic Tests:    Micro Data:  7/10, 7/15 SARS-CoV-2 positive 7/16 blood > coag neg staph in 1/4 7/31 blood >  7/31 resp > enterobacter, pseudomonas 8/7 resp > pseudomonas  Antimicrobials/COVID treatment  Ceftriaxone July 14 Azithromycin July 15 Remdesivir July 15 > July 19 Actemra July 15, July 19 Vancomycin July 18, July 19 Solumedrol 7/16 >> 8/14 Pamrevlumab vs placebo 7/29, 8/12 Cefepime 7/31> 8/6  Zosyn 8/9 >>   Interim history/subjective:  Increased O2 and PEEP requirements yesterday. Unable to prone due to ongoing oropharyngeal bleed Creatinine and sodium higher  today.  Objective   Blood pressure (!) 93/47, pulse 90, temperature 100 F (37.8 C), temperature source Oral, resp. rate 17, height 5\' 9"  (1.753 m), weight 108.4 kg, SpO2 91 %.    Vent Mode: PRVC FiO2 (%):  [100 %] 100 % Set Rate:  [35 bmp] 35 bmp Vt Set:  [430 mL] 430 mL PEEP:  [12 cmH20-16 cmH20] 12 cmH20 Plateau Pressure:  [30 cmH20-34 cmH20] 30 cmH20   Intake/Output Summary (Last 24 hours) at Apr 12, 2019 0802 Last data filed at April 12, 2019 0600 Gross per 24 hour  Intake 1816.76 ml  Output 1860 ml  Net -43.24 ml   Filed Weights   03/13/19 0500 03/14/19 0500 04-12-19 0340  Weight: 107.7 kg 107 kg 108.4 kg   Examination:   Gen:      No acute distress HEENT:  EOMI, sclera anicteric.  Dried blood in the mouth and nasal cavity. Neck:     No masses; no thyromegaly, ET tube Lungs:    Clear to auscultation bilaterally; normal respiratory effort CV:         Regular rate and rhythm; no murmurs Abd:      + bowel sounds; soft, non-tender; no palpable masses, no distension Ext:    No edema; adequate peripheral perfusion Skin:      Warm and dry; no rash Neuro: Sedated, unresponsive  Resolved Hospital Problem list    Assessment & Plan:  ARDS due to COVID-19 pneumonia: C/b PTX and recurrent Pseudomonas HCAP. Vent Day 13  Plan Continue ARDS net ventilation Unable to prone due to ongoing bleeding Keep PEEP at 12 as plateau pressure tends to go greater than 30 with higher PEEP. Hold Lasix as creatinine is higher. Prednisone tapered off.  Right sided pneumothorax status post  90 French pigtail chest tube catheter Chest tube to suction Follow intermittent chest x-ray  HCAP: pseudomonas and enterobacter: Repeat culture with abundant PA: favor active infection vs colonization in setting of worsening leukocytosis and persistent hypoxemia S/p Cefepime x 7 days Continue Zosyn for 7 day course.  Agitation requiring sedation: episodes of dyssynchrony, fentanyl tachyphylaxis? Continue  ketamine, Dilaudid and propofol On oxycodone and Klonopin.  Thromboctytopenia: HIIT ab negative, resolved Holding Lovenox due to oro pharyngeal bleeding.  Best practice:  Diet: tube feeding Pain/Anxiety/Delirium protocol (if indicated): yes, see above VAP protocol (if indicated): yes DVT prophylaxis: SCD GI prophylaxis: Pantoprazole for stress ulcer prophylaxis Glucose control: per TRH Mobility: bed rest Code Status: DNR Family Communication: per TRH.  Disposition: remain in ICU  Critical care time: 45 mins   The patient is critically ill with multiple organ system failure and requires high complexity decision making for assessment and support, frequent evaluation and titration of therapies, advanced monitoring, review of radiographic studies and interpretation of complex data.    Marshell Garfinkel MD [place] [facility] [facility] Pager [phone] If no answer call [phone] 03-17-2019, 8:02 AM

## 2019-04-02 NOTE — Plan of Care (Signed)
  Problem: Education: Goal: Knowledge of General Education information will improve Description: Including pain rating scale, medication(s)/side effects and non-pharmacologic comfort measures Outcome: Adequate for Discharge   Problem: Health Behavior/Discharge Planning: Goal: Ability to manage health-related needs will improve Outcome: Adequate for Discharge   Problem: Clinical Measurements: Goal: Ability to maintain clinical measurements within normal limits will improve Outcome: Adequate for Discharge Goal: Will remain free from infection Outcome: Adequate for Discharge Goal: Diagnostic test results will improve Outcome: Adequate for Discharge Goal: Respiratory complications will improve Outcome: Adequate for Discharge Goal: Cardiovascular complication will be avoided Outcome: Adequate for Discharge   Problem: Activity: Goal: Risk for activity intolerance will decrease Outcome: Adequate for Discharge   Problem: Nutrition: Goal: Adequate nutrition will be maintained Outcome: Adequate for Discharge   Problem: Coping: Goal: Level of anxiety will decrease Outcome: Adequate for Discharge   Problem: Elimination: Goal: Will not experience complications related to bowel motility Outcome: Adequate for Discharge Goal: Will not experience complications related to urinary retention Outcome: Adequate for Discharge   Problem: Pain Managment: Goal: General experience of comfort will improve Outcome: Adequate for Discharge   Problem: Safety: Goal: Ability to remain free from injury will improve Outcome: Adequate for Discharge   Problem: Skin Integrity: Goal: Risk for impaired skin integrity will decrease Outcome: Adequate for Discharge   Problem: Education: Goal: Knowledge of risk factors and measures for prevention of condition will improve Outcome: Adequate for Discharge   Problem: Coping: Goal: Psychosocial and spiritual needs will be supported Outcome: Adequate for  Discharge   Problem: Respiratory: Goal: Will maintain a patent airway Outcome: Adequate for Discharge Goal: Complications related to the disease process, condition or treatment will be avoided or minimized Outcome: Adequate for Discharge   Problem: Respiratory: Goal: Ability to maintain a clear airway and adequate ventilation will improve Outcome: Adequate for Discharge

## 2019-04-02 NOTE — Progress Notes (Signed)
ICU CN assuming care during staff break. Patient desaturated to 78% sustained, pulling to the right side during inspiration and new onset SVT sustaining in the 150s. Paged RT and increased PEEP to 16. Dr. Kimber Relic now at the bedside performing ultrasound. Bolus of 1mg  of Dilauded and 10mg  of vecuronium given IV. Stat chest xray ordered.

## 2019-04-02 NOTE — Discharge Summary (Signed)
Death Summary   Clayton Sawyer HKV:[id number] DOB: [birth year] DOA: March 06, 2019  PCP: Clayton Marble, MD  Admit date: 2019/03/06 Date of Death: 2019/04/06  Final Diagnoses:  COVID pneumonia ARDS Septic shock Pseudomonas pneumonia Right-sided spontaneous pneumothorax Acute renal failure Obesity  History of present illness:  78 year old with a history of HTN, CAD status post PCI, pacemaker placement, and prostate cancer who presented to [facility]Mar[facility]2023 with shortness of breath and was found to be in acute hypoxic respiratory failure with COVID pneumonia.  He was admitted to [place] from the ED.  Hospital Course:  06-Mar-2023 admit to [facility] via [facility] ED 7/16 Clayton Sawyer consulted - enrolled in fibrogen research study - [place] utilized  7/23 transferred out of ICU  7/26 resp decline - refused transfer back to ICU  7/27 worsening oxygenation- new right-sided pneumothorax 7/28 returned to ICU - intubated - right-sided chest tube placed 8/2 worsening ventilator dyssynchrony 8/4 transitioned to DNR/NO CODE BLUE 8/5 family requested transfer to [facility] who refused to accept the patient 8/6 Palliative Care consulted - family declined comfort care  April 06, 2023 pt rapidly declined w/ progressive ARDS and shock - pt died at 11:47AM  Clayton Sawyer was treated with steroids, Remdesivir, and Actemra x2.  After initially improving improving he was able to be transferred out of the ICU 7/23 but subsequently decompensated. He returned to the ICU on 728 when he required intubation and chest tube placement for a pneumothorax. The remainder of his hospital course was spent in the intensive care unit.  All available aggressive medical interventions were afforded him.  For many days the patient held his on without showing major signs of improvement nor major signs of further decline.  Given the prolonged nature of his severe critical illness the medical team became very concerned that the patient's  likelihood of surviving was dwindling.  He was frequently placed in the prone position to lower his oxygen needs.  Unfortunately each time he was returned to the supine position his oxygen requirements rapidly increased again, indicative that his lungs were were not improving.  Our ability to continue to prone him was frequently inhibited by recurrent epistaxis.  In the early morning hours of Apr 06, 2023 the patient was felt to be essentially unchanged from the day prior, meaning that he remained critically ill without significant improvement or decline.  As the morning progressed however he acutely and rapidly began to deteriorate.  His blood pressures dropped precipitously and despite all efforts with adjustment of the ventilator we were not able to maintain his oxygenation.  A stat chest x-ray was obtained and there was no evidence of recurrent pneumothorax but it did reveal that the patient's ARDS had abruptly worsened/progressed.  Aggressive medical management continued but the patient showed no meaningful response.  It became clear that the patient was not going to survive.  The family was emergently contacted.  The patient's granddaughter was able to video conference into the room and speak with the patient prior to his death.  Multiple members of the nursing staff and his attending physician were at the bedside.  There was no evidence of anxiety, air hunger, uncontrolled pain, physical or emotional struggling.  The patient peacefully died at 11:47 AM.  His cause of death was ARDS and hypoxic respiratory failure with septic shock in the setting of progressive COVID pneumonia as well as Pseudomonas pneumonia.   Signed:  Cherene Sawyer  Clayton Sawyer 04/06/2019, 4:44 PM

## 2019-04-02 NOTE — Progress Notes (Signed)
Patient belongings at bedside: 2 Bibles 1 watch 1 ring 1 cell phone 1 charger 1 electric razor 1 bag candy 1 bottle aromatherapy 1 pair shoes 1 pair shorts 2 Tee shirts 2 polo shirts 7 pair underware

## 2019-04-02 NOTE — Progress Notes (Signed)
Peep changed to 16 per CCM to reduce the driving pressure. Driving pressure is plateau pressure minus peep. Target is < 15.

## 2019-04-02 NOTE — Procedures (Signed)
**Note De-Identified Mekai Wilkinson Obfuscation** Extubation Procedure Note  Patient Details:   Name: Clayton Sawyer DOB: January 25, 1941 MRN: [id number]   Airway Documentation:    Vent end date: 04/11/19 Vent end time: 1225   Evaluation   O2 sats: died on ventilator Complications: No apparent complications Patient did not tolerate procedure well. Bilateral Breath Sounds: Diminished   No  Ithzel Fedorchak, Penni Bombard 04-11-19, 12:27 PM

## 2019-04-02 NOTE — Progress Notes (Signed)
Wasted remaining ketamine and dilaudid with charge nurse Lurline Hare

## 2019-04-02 NOTE — Progress Notes (Signed)
Assisted tele visit to patient with family member.  Kitti Mcclish M, RN  

## 2019-04-02 NOTE — Progress Notes (Addendum)
PCCM note  Called to bedside for desaturations, shock, T 102 Increased PEEP to 18. Attempted PCV with inverse ration without effect ABG shows PCo2 > 90, PO2 30-40s Given on dose vecuronium for vent dysynchrony No pneumothorax on bedside US X ray shows worsening air space opacities  Pt proceeded to PEA arrest. Epi X 2 given He passed away at 11:47AM No CPR attemped as he was DNR.  The patient is critically ill with multiple organ system failure and requires high complexity decision making for assessment and support, frequent evaluation and titration of therapies, advanced monitoring, review of radiographic studies and interpretation of complex data.   Critical Care Time devoted to patient care services, exclusive of separately billable procedures, described in this note is 35 minutes.   Marshell Garfinkel MD [place] [facility] Pager [phone] If no answer call [phone] 03-31-19, 11:12 AM

## 2019-04-02 NOTE — Progress Notes (Signed)
Patient rapidly decompensating. Heart rate dropped to 60 paced. Patient pale and mottled with central cyanosis. Blood pressure dropping with MAPs in the 20s/30s. Sp02 25%.  Dr. Thereasa Solo came to assess patient and stepped out to called the patient's family. Dr. Kimber Relic remains at the bedside. Two amps (1mg ) of epinephrine administered as rescue medication per verbal order by Dr. Kimber Relic. Patient is now tachycardic at 133, sp02 76%, and blood pressure 258/88.

## 2019-04-02 DEATH — deceased

## 2019-07-17 ENCOUNTER — Ambulatory Visit: Payer: Medicare Other | Admitting: Urology

## 2019-12-03 IMAGING — DX PORTABLE CHEST - 1 VIEW
1 series · 1 of 1 positions shown · non-contrast
Comparison: March 08, 2019

CLINICAL DATA: Pneumothorax

EXAM:
PORTABLE CHEST 1 VIEW

[chest ap]
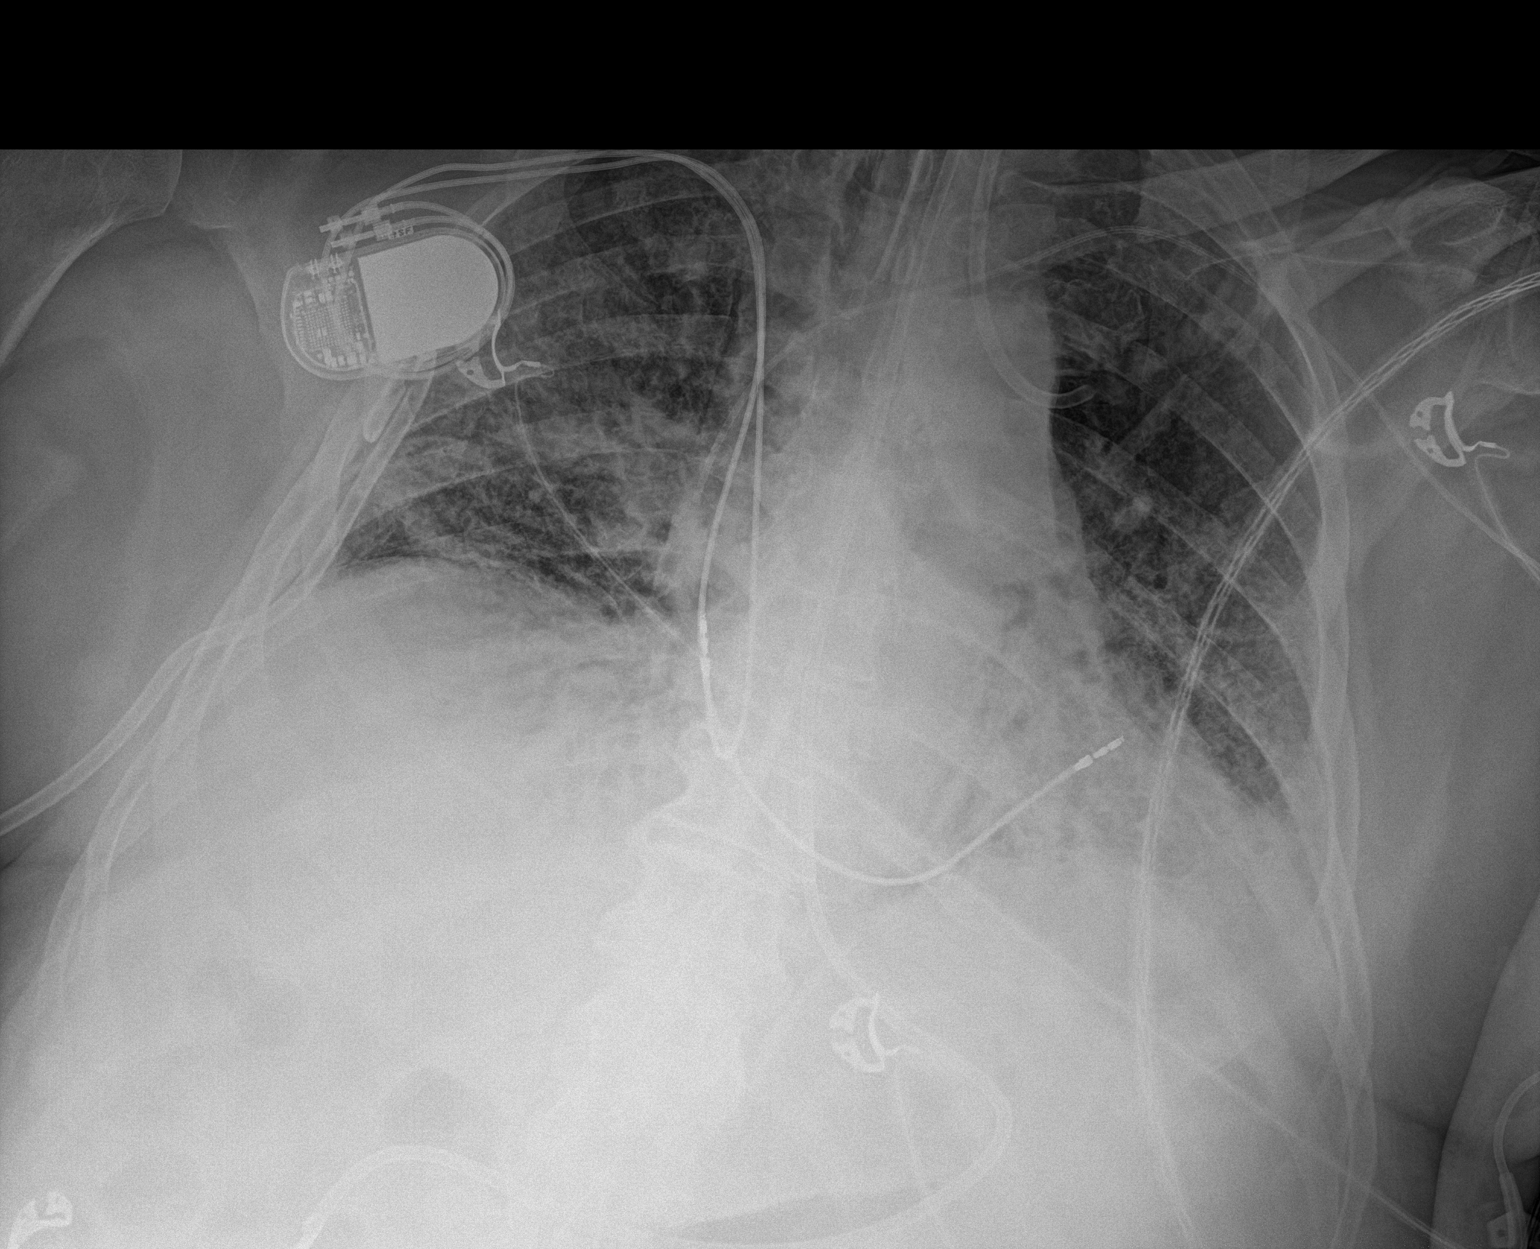

[1 of 1 positions shown; findings below may reference images not displayed]

FINDINGS: Again seen is a small right apical pneumothorax. Overlying pigtail
catheter is seen. There is reticulonodular and interstitial
opacities seen throughout both lungs as on the prior exam. The
cardiomediastinal silhouette is unchanged. ET tube is seen 2 cm
above the level of carina. Enteric tube is seen coursing below the
diaphragm.
IMPRESSION: Tiny right apical pneumothorax, unchanged from prior.

No significant change in the bilateral interstitial and
reticulonodular opacities.

ET tube and enteric tube in satisfactory position.

These results will be called to the ordering clinician or
representative by the Radiologist Assistant, and communication
documented in the PACS or zVision Dashboard.
